# Patient Record
Sex: Male | Born: 1945 | Race: White | Hispanic: No | Marital: Single | Smoking: Former smoker
Health system: Southern US, Community
[De-identification: ages and names within clinical notes are randomized; demographics above are authoritative.]

## PROBLEM LIST (undated history)

## (undated) DIAGNOSIS — Z87442 Personal history of urinary calculi: Secondary | ICD-10-CM

## (undated) DIAGNOSIS — I219 Acute myocardial infarction, unspecified: Secondary | ICD-10-CM

## (undated) DIAGNOSIS — G629 Polyneuropathy, unspecified: Secondary | ICD-10-CM

## (undated) DIAGNOSIS — E785 Hyperlipidemia, unspecified: Secondary | ICD-10-CM

## (undated) DIAGNOSIS — R0600 Dyspnea, unspecified: Secondary | ICD-10-CM

## (undated) DIAGNOSIS — Z95820 Peripheral vascular angioplasty status with implants and grafts: Secondary | ICD-10-CM

## (undated) DIAGNOSIS — N35919 Unspecified urethral stricture, male, unspecified site: Secondary | ICD-10-CM

## (undated) DIAGNOSIS — M1990 Unspecified osteoarthritis, unspecified site: Secondary | ICD-10-CM

## (undated) DIAGNOSIS — Z794 Long term (current) use of insulin: Secondary | ICD-10-CM

## (undated) DIAGNOSIS — I255 Ischemic cardiomyopathy: Secondary | ICD-10-CM

## (undated) DIAGNOSIS — E119 Type 2 diabetes mellitus without complications: Secondary | ICD-10-CM

## (undated) DIAGNOSIS — C801 Malignant (primary) neoplasm, unspecified: Secondary | ICD-10-CM

## (undated) DIAGNOSIS — N200 Calculus of kidney: Secondary | ICD-10-CM

## (undated) DIAGNOSIS — M19042 Primary osteoarthritis, left hand: Secondary | ICD-10-CM

## (undated) DIAGNOSIS — N182 Chronic kidney disease, stage 2 (mild): Secondary | ICD-10-CM

## (undated) DIAGNOSIS — I2510 Atherosclerotic heart disease of native coronary artery without angina pectoris: Secondary | ICD-10-CM

## (undated) DIAGNOSIS — G479 Sleep disorder, unspecified: Secondary | ICD-10-CM

## (undated) DIAGNOSIS — I209 Angina pectoris, unspecified: Secondary | ICD-10-CM

## (undated) DIAGNOSIS — R200 Anesthesia of skin: Secondary | ICD-10-CM

## (undated) DIAGNOSIS — F1190 Opioid use, unspecified, uncomplicated: Secondary | ICD-10-CM

## (undated) DIAGNOSIS — E1142 Type 2 diabetes mellitus with diabetic polyneuropathy: Secondary | ICD-10-CM

## (undated) DIAGNOSIS — J449 Chronic obstructive pulmonary disease, unspecified: Secondary | ICD-10-CM

## (undated) DIAGNOSIS — I10 Essential (primary) hypertension: Secondary | ICD-10-CM

## (undated) DIAGNOSIS — IMO0001 Reserved for inherently not codable concepts without codable children: Secondary | ICD-10-CM

## (undated) DIAGNOSIS — H409 Unspecified glaucoma: Secondary | ICD-10-CM

## (undated) DIAGNOSIS — I2109 ST elevation (STEMI) myocardial infarction involving other coronary artery of anterior wall: Secondary | ICD-10-CM

## (undated) DIAGNOSIS — Z950 Presence of cardiac pacemaker: Secondary | ICD-10-CM

## (undated) DIAGNOSIS — C4490 Unspecified malignant neoplasm of skin, unspecified: Secondary | ICD-10-CM

## (undated) DIAGNOSIS — I491 Atrial premature depolarization: Secondary | ICD-10-CM

## (undated) DIAGNOSIS — C4491 Basal cell carcinoma of skin, unspecified: Secondary | ICD-10-CM

## (undated) HISTORY — PX: TONSILLECTOMY: SUR1361

## (undated) HISTORY — PX: CORONARY ANGIOPLASTY: SHX604

## (undated) HISTORY — PX: OTHER SURGICAL HISTORY: SHX169

## (undated) HISTORY — PX: LITHOTRIPSY: SUR834

## (undated) HISTORY — PX: NASAL SINUS SURGERY: SHX719

## (undated) HISTORY — PX: CARDIAC CATHETERIZATION: SHX172

---
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
## 2003-09-27 ENCOUNTER — Other Ambulatory Visit: Payer: Self-pay

## 2004-05-02 ENCOUNTER — Ambulatory Visit: Payer: Self-pay | Admitting: Urology

## 2006-04-04 ENCOUNTER — Ambulatory Visit: Payer: Self-pay | Admitting: Unknown Physician Specialty

## 2006-04-22 ENCOUNTER — Other Ambulatory Visit: Payer: Self-pay

## 2006-05-05 ENCOUNTER — Ambulatory Visit: Payer: Self-pay | Admitting: Unknown Physician Specialty

## 2008-08-04 ENCOUNTER — Ambulatory Visit: Payer: Self-pay | Admitting: Urology

## 2008-10-20 ENCOUNTER — Ambulatory Visit: Payer: Self-pay | Admitting: Urology

## 2008-10-24 ENCOUNTER — Ambulatory Visit: Payer: Self-pay | Admitting: Urology

## 2009-07-17 ENCOUNTER — Ambulatory Visit: Payer: Self-pay | Admitting: Urology

## 2011-10-31 ENCOUNTER — Ambulatory Visit: Payer: Self-pay | Admitting: Anesthesiology

## 2011-10-31 DIAGNOSIS — I10 Essential (primary) hypertension: Secondary | ICD-10-CM

## 2011-11-04 ENCOUNTER — Ambulatory Visit: Payer: Self-pay | Admitting: Urology

## 2011-12-26 ENCOUNTER — Ambulatory Visit: Payer: Self-pay | Admitting: Urology

## 2012-07-23 ENCOUNTER — Ambulatory Visit: Payer: Self-pay | Admitting: Orthopedic Surgery

## 2012-11-18 ENCOUNTER — Ambulatory Visit: Payer: Self-pay | Admitting: Urology

## 2012-11-19 ENCOUNTER — Ambulatory Visit: Payer: Self-pay | Admitting: Urology

## 2013-04-08 HISTORY — PX: PAROTIDECTOMY: SUR1003

## 2013-06-01 ENCOUNTER — Ambulatory Visit: Payer: Self-pay | Admitting: Unknown Physician Specialty

## 2013-07-12 DIAGNOSIS — I10 Essential (primary) hypertension: Secondary | ICD-10-CM | POA: Insufficient documentation

## 2013-08-16 ENCOUNTER — Ambulatory Visit: Payer: Self-pay | Admitting: Unknown Physician Specialty

## 2013-09-07 ENCOUNTER — Ambulatory Visit: Payer: Self-pay | Admitting: Unknown Physician Specialty

## 2013-09-08 LAB — PATHOLOGY REPORT

## 2014-06-20 DIAGNOSIS — L570 Actinic keratosis: Secondary | ICD-10-CM | POA: Diagnosis not present

## 2014-06-20 DIAGNOSIS — L821 Other seborrheic keratosis: Secondary | ICD-10-CM | POA: Diagnosis not present

## 2014-06-20 DIAGNOSIS — C44319 Basal cell carcinoma of skin of other parts of face: Secondary | ICD-10-CM | POA: Diagnosis not present

## 2014-06-20 DIAGNOSIS — C4491 Basal cell carcinoma of skin, unspecified: Secondary | ICD-10-CM | POA: Diagnosis not present

## 2014-06-20 DIAGNOSIS — D485 Neoplasm of uncertain behavior of skin: Secondary | ICD-10-CM | POA: Diagnosis not present

## 2014-06-20 DIAGNOSIS — X32XXXA Exposure to sunlight, initial encounter: Secondary | ICD-10-CM | POA: Diagnosis not present

## 2014-07-26 NOTE — H&P (Signed)
PATIENT NAME:  Henry [name], Henry [name] MR#:  [id number] DATE OF BIRTH:  1945/12/19  DATE OF ADMISSION:  11/04/2011  CHIEF COMPLAINT: Difficulty voiding.   HISTORY OF PRESENT ILLNESS: Henry [name] is a 69 year old white male who presented to the office with a two week history of difficulty voiding. He states that he has a kidney stone stuck within his urethra. On exam he did have a stone palpable in the proximal bulbar urethra. He has a history of urethral stricture disease and has had a situation very similar to this with a stone stuck behind the stricture. He underwent removal of the stone and incision of the stricture in 2011. He comes in now for cystoscopy with stone retrieval and incision of the urethral stricture.   ALLERGIES: No drug allergies.   CURRENT MEDICATIONS:  1. Janumet. 2. Simvastatin. 3. Valsartan. 4. Aspirin. 5. Avapro.   PAST SURGICAL HISTORY:  1. Lithotripsy in 1995, 1997, 1999, and 2000.  2. Tonsillectomy in the 1950's. 3. Nasal and sinus reconstructive surgery in 1964. 4. Removal of left parotid gland in 2008. 5. Cysto litholapaxy in 2010, 2011, and 2013.   PAST AND CURRENT MEDICAL CONDITION.  1. Hyperlipidemia.  2. Diabetes. 3. Arthritis.  4. Allergic rhinitis.   SOCIAL HISTORY: He denied tobacco use but quit smoking several years ago with a 25-pack-year history. He denied alcohol use.   FAMILY HISTORY: Noncontributory.   REVIEW OF SYSTEMS: The patient denied chest pain, shortness of breath, stroke, or coronary artery disease.   PHYSICAL EXAMINATION:   GENERAL: Well nourished white male in no distress.   HEENT: Sclerae were clear. Pupils were equally round, reactive to light and accommodation. Extraocular movements were intact.   NECK: Supple. No palpable cervical adenopathy.   LUNGS: Clear to auscultation.   CARDIOVASCULAR: Regular rhythm and rate without audible murmurs.   ABDOMEN: Soft, nontender abdomen.   GENITOURINARY: Circumcised. Stone was  palpable in proximal urethra. Testes smooth and nontender, 20 mL size each.   RECTAL: 35 gram smooth nontender prostate.   NEUROMUSCULAR: Nonfocal. The patient is alert, oriented x3.   IMPRESSION:  1. Urethral stone.  2. Urethral stricture disease.        PLAN: Cystolitholapaxy and incision of stricture of the urethra.   ____________________________ Otelia Limes. Henry Dill, MD [email or web address] D: 11/01/2011 11:51:17 ET T: 11/01/2011 12:23:31 ET JOB#: [id number]  cc: Otelia Limes. Henry Dill, MD, <Dictator> Royston Cowper MD ELECTRONICALLY SIGNED 11/04/2011 7:28

## 2014-07-26 NOTE — Op Note (Signed)
PATIENT NAME:  Henry [name], Henry [name] MR#:  [id number] DATE OF BIRTH:  01/12/1946  DATE OF PROCEDURE:  11/04/2011  PREOPERATIVE DIAGNOSES: 1.  Urethral stone.  2. Urethral stricture.   POSTOPERATIVE DIAGNOSES:  1. Urethral stone.  2. Urethral stricture.   PROCEDURE: 1. Visual internal urethrotomy using the holmium laser.  2. Laser lithotripsy of urethral stone using holmium laser.   SURGEON: Maryan Puls, M.D.   ANESTHETIST:  Dr. Andree Elk   ANESTHESIA: General.   INDICATIONS: See the dictated History and Physical. After informed consent, the patient requests the above procedures.   OPERATIVE SUMMARY: After adequate general anesthesia had been obtained, the patient was placed into the dorsal lithotomy position and the perineum was prepped and draped in the usual fashion. The 17 French cystoscope was coupled with the camera and then placed into the distal urethra. The scope was advanced up to the stricture in the proximal pendulous urethra and also a stone was located just proximal to the stricture. The 910-micron holmium laser fiber was advanced through the scope and the stone was fully disintegrated. The stricture was incised at the 12 o'clock position and eventually allowed passage of the scope into the bladder. The bladder was thoroughly inspected. No bladder lesions were identified. Both ureteral orifices were identified and had clear efflux. At this point, a 0.035 guidewire was advanced through the scope and curled up into the bladder. Cystoscope was removed. A 20 French Councill urethral catheter was advanced over the guidewire and placed into the bladder. Guidewire was then removed, taking care to leave the Foley in position. The procedure was then terminated. A B and O suppository was placed. The patient was then transferred to the recovery room in stable condition.   ____________________________ Otelia Limes. Yves Dill, MD [email or web address] D: 11/04/2011 12:24:30 ET T: 11/04/2011 14:14:43  ET JOB#: [id number]  cc: Otelia Limes. Yves Dill, MD, <Dictator> Royston Cowper MD ELECTRONICALLY SIGNED 11/04/2011 16:07

## 2014-07-30 NOTE — Op Note (Signed)
PATIENT NAME:  Henry [name], Henry [name] MR#:  [id number] DATE OF BIRTH:  July 28, 1945  DATE OF PROCEDURE:  09/07/2013  PREOPERATIVE DIAGNOSIS: Right parotid mass.   POSTOPERATIVE DIAGNOSIS: Right parotid mass.  ATTENDING SURGEON: Beverly Gust, MD  ASSISTANT SURGEON: Dr. Kathyrn Sheriff.   OPERATION PERFORMED:  1.  Facial nerve monitoring for an hour and a half.  2.  Right superficial parotidectomy.   OPERATIVE FINDINGS: Approximately 3 x 3 cm firm mass in the inferior aspect of the superficial lobe of the parotid on the right.   DESCRIPTION OF PROCEDURE: Henry [name] was identified in the holding area, taken to the operating room and placed in the supine position. After general endotracheal anesthesia, the table was turned 90 degrees. The facial nerve monitor was applied and left on throughout the procedure. An incision line was marked in the preauricular/pretragal area down in the infra-auricular space down to the upper neck in a natural skin crease. A local anesthetic of 1% lidocaine with 1:100,000 units of epinephrine was used to inject along the incision line. A total of 9 mL was used. The right face and neck were then prepped and draped in sterile fashion. A 15 blade was used to incise down to the superficial muscular aponeurotic system superiorly and the platysma inferiorly. A supra-SMAS flap was elevated onto the face anteriorly and posterior down on the neck. Hemostasis was achieved using the Bovie cautery. The sternocleidomastoid muscle was identified and the parotid gland was peeled off of the sternocleidomastoid muscle anteriorly. This dissection proceeded up in the pretragal area as the parotid was peeled off of the pretracheal cartilage. The microbipolar was used for hemostasis in this part of the dissection.  As the dissection proceeded medially, the posterior belly of the digastric muscle was identified and the facial nerve was identified as it emanated from the temporal bone. This was stimulated and  all branches of the nerve remained intact. The firm mass in the parotid, which was in the anterior aspect adjacent to the sternocleidomastoid muscle, was gently dissected free maintaining the capsule in position. The facial nerve was then dissected laterally. The temporal branch was easily identified and left preserved throughout the case. The zygomatic branch was also identified. Inferior to this as the buccal and marginal branches came off the inferior branch of the nerve, the parotid gland was peeled off. The Harmonic scalpel was used for this dissection. The marginal branch and the cervical branch were then traced inferiorly as the gland was peeled from the nerve. The retromandibular vein was identified and a branch was emanating from the tumor itself. This was divided between hemostats and suture ligated using 3-0 silk suture ligatures. The tumor was then peeled off of the inferior branches of the facial nerve and the retromandibular vein was left intact. As the superficial lobe of the gland in the tumor was peeled inferiorly, the fascial attachments were divided removing the tumor in its entirety.  The wound was then copiously irrigated with saline. Any small bleeding points were cauterized using the microbipolar. At the end of the case, the main trunk of the facial nerve was stimulated and all branches remained intact. With this completed, a #10 TLS drain was brought out of the wound inferiorly. The SMAS layer was closed using interrupted 4-0 Vicryl. The subcutaneous layers were also closed using 4-0 Vicryl. The skin was closed using Dermabond. A Tegaderm was placed over the drain and the patient was then returned to anesthesia where he was awakened in the operating room and  taken to the recovery room in stable condition.   CULTURES: None.   SPECIMENS: Right lobe parotid.   ESTIMATED BLOOD LOSS: Less than 20 mL.   ____________________________ Roena Malady, MD ctm:aw D: 09/07/2013 09:35:16  ET T: 09/07/2013 09:41:21 ET JOB#: [id number]  cc: Roena Malady, MD, <Dictator> Roena Malady MD ELECTRONICALLY SIGNED 10/14/2013 13:41

## 2014-08-09 DIAGNOSIS — C44319 Basal cell carcinoma of skin of other parts of face: Secondary | ICD-10-CM | POA: Diagnosis not present

## 2014-08-09 DIAGNOSIS — L905 Scar conditions and fibrosis of skin: Secondary | ICD-10-CM | POA: Diagnosis not present

## 2014-08-22 DIAGNOSIS — H4020X1 Unspecified primary angle-closure glaucoma, mild stage: Secondary | ICD-10-CM | POA: Diagnosis not present

## 2014-09-13 DIAGNOSIS — C4441 Basal cell carcinoma of skin of scalp and neck: Secondary | ICD-10-CM | POA: Diagnosis not present

## 2014-09-16 DIAGNOSIS — C44619 Basal cell carcinoma of skin of left upper limb, including shoulder: Secondary | ICD-10-CM | POA: Diagnosis not present

## 2014-09-16 DIAGNOSIS — D485 Neoplasm of uncertain behavior of skin: Secondary | ICD-10-CM | POA: Diagnosis not present

## 2014-09-16 DIAGNOSIS — Z85828 Personal history of other malignant neoplasm of skin: Secondary | ICD-10-CM | POA: Diagnosis not present

## 2014-10-14 DIAGNOSIS — C4491 Basal cell carcinoma of skin, unspecified: Secondary | ICD-10-CM | POA: Diagnosis not present

## 2014-10-14 DIAGNOSIS — Z0000 Encounter for general adult medical examination without abnormal findings: Secondary | ICD-10-CM | POA: Diagnosis not present

## 2014-10-14 DIAGNOSIS — E785 Hyperlipidemia, unspecified: Secondary | ICD-10-CM | POA: Diagnosis not present

## 2014-10-14 DIAGNOSIS — E1129 Type 2 diabetes mellitus with other diabetic kidney complication: Secondary | ICD-10-CM | POA: Diagnosis not present

## 2014-10-14 DIAGNOSIS — I10 Essential (primary) hypertension: Secondary | ICD-10-CM | POA: Diagnosis not present

## 2014-11-04 DIAGNOSIS — L905 Scar conditions and fibrosis of skin: Secondary | ICD-10-CM | POA: Diagnosis not present

## 2014-11-04 DIAGNOSIS — C44619 Basal cell carcinoma of skin of left upper limb, including shoulder: Secondary | ICD-10-CM | POA: Diagnosis not present

## 2014-11-11 DIAGNOSIS — M170 Bilateral primary osteoarthritis of knee: Secondary | ICD-10-CM | POA: Diagnosis not present

## 2014-12-07 ENCOUNTER — Encounter: Payer: Self-pay | Admitting: *Deleted

## 2014-12-08 ENCOUNTER — Encounter
Admission: RE | Disposition: A | Discharge status: Home / Self Care | Payer: Self-pay | Source: Ambulatory Visit | Attending: Unknown Physician Specialty

## 2014-12-08 ENCOUNTER — Ambulatory Visit: Payer: Commercial Managed Care - HMO | Admitting: Certified Registered Nurse Anesthetist

## 2014-12-08 ENCOUNTER — Encounter: Payer: Self-pay | Admitting: *Deleted

## 2014-12-08 ENCOUNTER — Ambulatory Visit
Admission: RE | Admit: 2014-12-08 | Discharge: 2014-12-08 | Disposition: A | Discharge status: Home / Self Care | Payer: Commercial Managed Care - HMO | Source: Ambulatory Visit | Attending: Unknown Physician Specialty | Admitting: Unknown Physician Specialty

## 2014-12-08 DIAGNOSIS — K640 First degree hemorrhoids: Secondary | ICD-10-CM | POA: Diagnosis not present

## 2014-12-08 DIAGNOSIS — D120 Benign neoplasm of cecum: Secondary | ICD-10-CM | POA: Diagnosis not present

## 2014-12-08 DIAGNOSIS — Z794 Long term (current) use of insulin: Secondary | ICD-10-CM | POA: Insufficient documentation

## 2014-12-08 DIAGNOSIS — D122 Benign neoplasm of ascending colon: Secondary | ICD-10-CM | POA: Diagnosis not present

## 2014-12-08 DIAGNOSIS — Z859 Personal history of malignant neoplasm, unspecified: Secondary | ICD-10-CM | POA: Diagnosis not present

## 2014-12-08 DIAGNOSIS — E1122 Type 2 diabetes mellitus with diabetic chronic kidney disease: Secondary | ICD-10-CM | POA: Insufficient documentation

## 2014-12-08 DIAGNOSIS — E785 Hyperlipidemia, unspecified: Secondary | ICD-10-CM | POA: Insufficient documentation

## 2014-12-08 DIAGNOSIS — Z7982 Long term (current) use of aspirin: Secondary | ICD-10-CM | POA: Insufficient documentation

## 2014-12-08 DIAGNOSIS — N189 Chronic kidney disease, unspecified: Secondary | ICD-10-CM | POA: Diagnosis not present

## 2014-12-08 DIAGNOSIS — D123 Benign neoplasm of transverse colon: Secondary | ICD-10-CM | POA: Insufficient documentation

## 2014-12-08 DIAGNOSIS — I129 Hypertensive chronic kidney disease with stage 1 through stage 4 chronic kidney disease, or unspecified chronic kidney disease: Secondary | ICD-10-CM | POA: Diagnosis not present

## 2014-12-08 DIAGNOSIS — D128 Benign neoplasm of rectum: Secondary | ICD-10-CM | POA: Diagnosis not present

## 2014-12-08 DIAGNOSIS — K5790 Diverticulosis of intestine, part unspecified, without perforation or abscess without bleeding: Secondary | ICD-10-CM | POA: Diagnosis not present

## 2014-12-08 DIAGNOSIS — K5730 Diverticulosis of large intestine without perforation or abscess without bleeding: Secondary | ICD-10-CM | POA: Diagnosis not present

## 2014-12-08 DIAGNOSIS — Z87891 Personal history of nicotine dependence: Secondary | ICD-10-CM | POA: Insufficient documentation

## 2014-12-08 DIAGNOSIS — D127 Benign neoplasm of rectosigmoid junction: Secondary | ICD-10-CM | POA: Insufficient documentation

## 2014-12-08 DIAGNOSIS — D124 Benign neoplasm of descending colon: Secondary | ICD-10-CM | POA: Insufficient documentation

## 2014-12-08 DIAGNOSIS — Z1211 Encounter for screening for malignant neoplasm of colon: Secondary | ICD-10-CM | POA: Insufficient documentation

## 2014-12-08 DIAGNOSIS — H409 Unspecified glaucoma: Secondary | ICD-10-CM | POA: Insufficient documentation

## 2014-12-08 DIAGNOSIS — K635 Polyp of colon: Secondary | ICD-10-CM | POA: Diagnosis not present

## 2014-12-08 HISTORY — DX: Malignant (primary) neoplasm, unspecified: C80.1

## 2014-12-08 HISTORY — DX: Hyperlipidemia, unspecified: E78.5

## 2014-12-08 HISTORY — DX: Essential (primary) hypertension: I10

## 2014-12-08 HISTORY — DX: Unspecified osteoarthritis, unspecified site: M19.90

## 2014-12-08 HISTORY — DX: Unspecified glaucoma: H40.9

## 2014-12-08 HISTORY — PX: COLONOSCOPY WITH PROPOFOL: SHX5780

## 2014-12-08 LAB — GLUCOSE, CAPILLARY: GLUCOSE-CAPILLARY: 146 mg/dL — AB (ref 65–99)

## 2014-12-08 SURGERY — COLONOSCOPY WITH PROPOFOL
Anesthesia: General

## 2014-12-08 MED ORDER — SODIUM CHLORIDE 0.9 % IV SOLN
INTRAVENOUS | Status: DC
Start: 2014-12-08 — End: 2014-12-08
  Administered 2014-12-08: 08:00:00 via INTRAVENOUS

## 2014-12-08 MED ORDER — PROPOFOL INFUSION 10 MG/ML OPTIME
INTRAVENOUS | Status: DC | PRN
  Administered 2014-12-08: 120 ug/kg/min via INTRAVENOUS

## 2014-12-08 MED ORDER — PROPOFOL 10 MG/ML IV BOLUS
INTRAVENOUS | Status: DC | PRN
  Administered 2014-12-08: 50 mg via INTRAVENOUS

## 2014-12-08 MED ORDER — EPHEDRINE SULFATE 50 MG/ML IJ SOLN
INTRAMUSCULAR | Status: DC | PRN
  Administered 2014-12-08 (×2): 5 mg via INTRAVENOUS

## 2014-12-08 MED ORDER — PHENYLEPHRINE HCL 10 MG/ML IJ SOLN
INTRAMUSCULAR | Status: DC | PRN
  Administered 2014-12-08 (×2): 100 ug via INTRAVENOUS

## 2014-12-08 MED ORDER — MIDAZOLAM HCL 5 MG/5ML IJ SOLN
INTRAMUSCULAR | Status: DC | PRN
Start: 2014-12-08 — End: 2014-12-08
  Administered 2014-12-08: 1 mg via INTRAVENOUS

## 2014-12-08 MED ORDER — FENTANYL CITRATE (PF) 100 MCG/2ML IJ SOLN
INTRAMUSCULAR | Status: DC | PRN
  Administered 2014-12-08: 50 ug via INTRAVENOUS

## 2014-12-08 MED ORDER — VASOPRESSIN 20 UNIT/ML IV SOLN
INTRAVENOUS | Status: DC | PRN
  Administered 2014-12-08 (×2): 1 [IU] via INTRAVENOUS

## 2014-12-08 MED ORDER — METHYLENE BLUE 1 % INJ SOLN
INTRAMUSCULAR | Status: AC
  Filled 2014-12-08: qty 1

## 2014-12-08 MED ORDER — SODIUM CHLORIDE 0.9 % IV SOLN: INTRAVENOUS | Status: DC

## 2014-12-08 NOTE — Op Note (Signed)
[facility] Patient Name: Henry [name] Procedure Date: 12/08/2014 8:01 AM MRN: [id number] Account #: [id number] Date of Birth: [birth year] Admit Type: Outpatient Age: 69 Room: [facility] ENDO ROOM 1 Gender: Male Note Status: Finalized Procedure:         Colonoscopy Indications:       Screening for colorectal malignant neoplasm Providers:         Manya Silvas, MD Referring MD:      Youlanda Roys. Lovie Macadamia, MD (Referring MD) Medicines:         Propofol per Anesthesia Complications:     No immediate complications. Procedure:         Pre-Anesthesia Assessment:                    - After reviewing the risks and benefits, the patient was                     deemed in satisfactory condition to undergo the procedure.                    After obtaining informed consent, the colonoscope was                     passed under direct vision. Throughout the procedure, the                     patient's blood pressure, pulse, and oxygen saturations                     were monitored continuously. The Colonoscope was                     introduced through the anus and advanced to the the cecum,                     identified by appendiceal orifice and ileocecal valve. The                     colonoscopy was performed without difficulty. The patient                     tolerated the procedure well. The quality of the bowel                     preparation was good. Findings:      Internal hemorrhoids were found during endoscopy. The hemorrhoids were       medium-sized and Grade I (internal hemorrhoids that do not prolapse).      Multiple small and large-mouthed diverticula were found in the       transverse colon and in the ascending colon.      A medium polyp was found in the transverse colon. The polyp was sessile.       The polyp was removed with a hot snare. Resection and retrieval were       complete.      A diminutive polyp was found in the ascending colon. The  polyp was       sessile. The polyp was removed with a cold snare. Resection and       retrieval were complete.      A small polyp was found in the cecum. The polyp was sessile. The polyp       was removed with a hot snare. Resection and retrieval were complete.  A small polyp was found in the cecum. The polyp was sessile. The polyp       was removed with a hot snare. Resection and retrieval were complete.      A small polyp was found in the transverse colon. The polyp was sessile.       The polyp was removed with a hot snare. Resection and retrieval were       complete.      A small polyp was found in the transverse colon. The polyp was sessile.       The polyp was removed with a hot snare. Resection and retrieval were       complete. The polyp was removed with a cold snare. Resection and       retrieval were complete.      A diminutive polyp was found in the transverse colon. The polyp was       sessile. The polyp was removed with a cold snare. Resection and       retrieval were complete.      A medium polyp was found in the recto-sigmoid colon. The polyp was       sessile. The polyp was removed with a hot snare. Resection and retrieval       were complete.      A small polyp was found in the rectum. The polyp was sessile. The polyp       was removed with a hot snare. Resection and retrieval were complete.      A small polyp was found in the rectum. The polyp was sessile. The polyp       was removed with a hot snare. Resection and retrieval were complete.      Clips were placed on several sites to prevent delayed bleeding. Nine       were used. Impression:        - Internal hemorrhoids.                    - Diverticulosis in the transverse colon and in the                     ascending colon.                    - One medium polyp in the transverse colon. Resected and                     retrieved.                    - One diminutive polyp in the ascending colon. Resected                      and retrieved.                    - One small polyp in the cecum. Resected and retrieved.                    - One small polyp in the cecum. Resected and retrieved.                    - One small polyp in the transverse colon. Resected and                     retrieved.                    -  One small polyp in the transverse colon. Resected and                     retrieved.                    - One diminutive polyp in the transverse colon. Resected                     and retrieved.                    - One medium polyp at the recto-sigmoid colon. Resected                     and retrieved.                    - One small polyp in the rectum. Resected and retrieved.                    - One small polyp in the rectum. Resected and retrieved. Recommendation:    - Await pathology results. Manya Silvas, MD 12/08/2014 9:38:45 AM This report has been signed electronically. Number of Addenda: 0 Note Initiated On: 12/08/2014 8:01 AM Scope Withdrawal Time: 0 hours 37 minutes 22 seconds  Total Procedure Duration: 1 hour 14 minutes 10 seconds       [facility]

## 2014-12-08 NOTE — H&P (Signed)
   Primary Care Physician:  Juluis Pitch, MD Primary Gastroenterologist:  Dr. Vira Agar  Pre-Procedure History & Physical: HPI:  Henry [name] is a 69 y.o. male is here for an colonoscopy.   Past Medical History  Diagnosis Date  . Diabetes mellitus without complication   . Hypertension   . Cancer   . Hyperlipidemia   . Glaucoma   . Arthritis   . Chronic kidney disease     Past Surgical History  Procedure Laterality Date  . Lithotripsy      Prior to Admission medications   Medication Sig Start Date End Date Taking? Authorizing Provider  aspirin EC 81 MG tablet Take 81 mg by mouth daily.   Yes Historical Provider, MD  fluticasone (FLONASE) 50 MCG/ACT nasal spray Place 1 spray into both nostrils daily.   Yes Historical Provider, MD  insulin detemir (LEVEMIR) 100 UNIT/ML injection Inject 100 Units into the skin at bedtime.   Yes Historical Provider, MD  latanoprost (XALATAN) 0.005 % ophthalmic solution Place 1 drop into both eyes at bedtime.   Yes Historical Provider, MD  meloxicam (MOBIC) 15 MG tablet Take 15 mg by mouth daily.   Yes Historical Provider, MD  simvastatin (ZOCOR) 40 MG tablet Take 40 mg by mouth daily.   Yes Historical Provider, MD  sitaGLIPtin-metformin (JANUMET) 50-1000 MG per tablet Take 1 tablet by mouth 2 (two) times daily with a meal.   Yes Historical Provider, MD  valsartan (DIOVAN) 320 MG tablet Take 320 mg by mouth daily.   Yes Historical Provider, MD    Allergies as of 11/16/2014  . (Not on File)    History reviewed. No pertinent family history.  Social History   Social History  . Marital Status: Single    Spouse Name: N/A  . Number of Children: N/A  . Years of Education: N/A   Occupational History  . Not on file.   Social History Main Topics  . Smoking status: Former Research scientist (life sciences)  . Smokeless tobacco: Never Used  . Alcohol Use: No  . Drug Use: No  . Sexual Activity: Not on file   Other Topics Concern  . Not on file   Social History  Narrative    Review of Systems: See HPI, otherwise negative ROS  Physical Exam: BP 125/69 mmHg  Pulse 91  Temp(Src) 97.7 F (36.5 C) (Tympanic)  Resp 14  Ht 5\' 11"  (1.803 m)  Wt 99.791 kg (220 lb)  BMI 30.70 kg/m2  SpO2 96% General:   Alert,  pleasant and cooperative in NAD Head:  Normocephalic and atraumatic. Neck:  Supple; no masses or thyromegaly. Lungs:  Clear throughout to auscultation.    Heart:  Regular rate and rhythm. Abdomen:  Soft, nontender and nondistended. Normal bowel sounds, without guarding, and without rebound.   Neurologic:  Alert and  oriented x4;  grossly normal neurologically.  Impression/Plan: Henry [name] is here for an colonoscopy to be performed for screening  Risks, benefits, limitations, and alternatives regarding  colonoscopy have been reviewed with the patient.  Questions have been answered.  All parties agreeable.   Gaylyn Cheers, MD  12/08/2014, 8:07 AM

## 2014-12-08 NOTE — Anesthesia Preprocedure Evaluation (Signed)
Anesthesia Evaluation  Patient identified by MRN, date of birth, ID band Patient awake    Reviewed: Allergy & Precautions, H&P , NPO status , Patient's Chart, lab work & pertinent test results, reviewed documented beta blocker date and time   History of Anesthesia Complications Negative for: history of anesthetic complications  Airway Mallampati: III  TM Distance: >3 FB Neck ROM: full    Dental no notable dental hx. (+) Poor Dentition, Missing, Chipped   Pulmonary neg shortness of breath, neg sleep apnea, COPD (not diagnosed, but likely given history)neg recent URI, former smoker,  breath sounds clear to auscultation  + decreased breath sounds- wheezing      Cardiovascular Exercise Tolerance: Good hypertension, - angina- CAD, - Past MI, - Cardiac Stents and - CABG Normal cardiovascular exam- dysrhythmias - Valvular Problems/MurmursRhythm:regular Rate:Normal     Neuro/Psych negative neurological ROS  negative psych ROS   GI/Hepatic negative GI ROS, Neg liver ROS,   Endo/Other  diabetes, Insulin Dependent  Renal/GU CRFRenal disease  negative genitourinary   Musculoskeletal   Abdominal   Peds  Hematology negative hematology ROS (+)   Anesthesia Other Findings Past Medical History:   Diabetes mellitus without complication                       Hypertension                                                 Cancer                                                       Hyperlipidemia                                               Glaucoma                                                     Arthritis                                                    Chronic kidney disease                                       Reproductive/Obstetrics negative OB ROS                             Anesthesia Physical Anesthesia Plan  ASA: III  Anesthesia Plan: General   Post-op Pain Management:    Induction:    Airway Management Planned:   Additional Equipment:   Intra-op Plan:   Post-operative Plan:   Informed Consent: I have  reviewed the patients History and Physical, chart, labs and discussed the procedure including the risks, benefits and alternatives for the proposed anesthesia with the patient or authorized representative who has indicated his/her understanding and acceptance.   Dental Advisory Given  Plan Discussed with: Anesthesiologist, CRNA and Surgeon  Anesthesia Plan Comments:         Anesthesia Quick Evaluation

## 2014-12-08 NOTE — Transfer of Care (Signed)
Immediate Anesthesia Transfer of Care Note  Patient: Henry [name]  Procedure(s) Performed: Procedure(s): COLONOSCOPY WITH PROPOFOL (N/A)  Patient Location: PACU  Anesthesia Type:General  Level of Consciousness: sedated  Airway & Oxygen Therapy: Patient Spontanous Breathing and Patient connected to nasal cannula oxygen  Post-op Assessment: Report given to RN and Post -op Vital signs reviewed and stable  Post vital signs: Reviewed and stable  Last Vitals:  Filed Vitals:   12/08/14 0927  BP: 130/110  Pulse:   Temp: 36.9 C  Resp: 20    Complications: No apparent anesthesia complications

## 2014-12-08 NOTE — Anesthesia Postprocedure Evaluation (Signed)
  Anesthesia Post-op Note  Patient: Henry [name]  Procedure(s) Performed: Procedure(s): COLONOSCOPY WITH PROPOFOL (N/A)  Anesthesia type:General  Patient location: PACU  Post pain: Pain level controlled  Post assessment: Post-op Vital signs reviewed, Patient's Cardiovascular Status Stable, Respiratory Function Stable, Patent Airway and No signs of Nausea or vomiting  Post vital signs: Reviewed and stable  Last Vitals:  Filed Vitals:   12/08/14 0955  BP:   Pulse: 80  Temp:   Resp: 19    Level of consciousness: awake, alert  and patient cooperative  Complications: No apparent anesthesia complications

## 2014-12-09 LAB — SURGICAL PATHOLOGY

## 2015-01-20 DIAGNOSIS — R3914 Feeling of incomplete bladder emptying: Secondary | ICD-10-CM | POA: Diagnosis not present

## 2015-01-20 DIAGNOSIS — N401 Enlarged prostate with lower urinary tract symptoms: Secondary | ICD-10-CM | POA: Diagnosis not present

## 2015-01-20 DIAGNOSIS — N138 Other obstructive and reflux uropathy: Secondary | ICD-10-CM | POA: Diagnosis not present

## 2015-01-20 DIAGNOSIS — N200 Calculus of kidney: Secondary | ICD-10-CM | POA: Diagnosis not present

## 2015-02-17 DIAGNOSIS — L570 Actinic keratosis: Secondary | ICD-10-CM | POA: Diagnosis not present

## 2015-02-17 DIAGNOSIS — D235 Other benign neoplasm of skin of trunk: Secondary | ICD-10-CM | POA: Diagnosis not present

## 2015-02-17 DIAGNOSIS — L821 Other seborrheic keratosis: Secondary | ICD-10-CM | POA: Diagnosis not present

## 2015-02-17 DIAGNOSIS — Z85828 Personal history of other malignant neoplasm of skin: Secondary | ICD-10-CM | POA: Diagnosis not present

## 2015-02-17 DIAGNOSIS — D225 Melanocytic nevi of trunk: Secondary | ICD-10-CM | POA: Diagnosis not present

## 2015-02-17 DIAGNOSIS — D485 Neoplasm of uncertain behavior of skin: Secondary | ICD-10-CM | POA: Diagnosis not present

## 2015-02-20 ENCOUNTER — Encounter (INDEPENDENT_AMBULATORY_CARE_PROVIDER_SITE_OTHER): Payer: Self-pay

## 2015-02-20 DIAGNOSIS — H4020X1 Unspecified primary angle-closure glaucoma, mild stage: Secondary | ICD-10-CM | POA: Diagnosis not present

## 2015-02-28 DIAGNOSIS — H40003 Preglaucoma, unspecified, bilateral: Secondary | ICD-10-CM | POA: Diagnosis not present

## 2015-04-07 DIAGNOSIS — E1129 Type 2 diabetes mellitus with other diabetic kidney complication: Secondary | ICD-10-CM | POA: Diagnosis not present

## 2015-04-11 DIAGNOSIS — M170 Bilateral primary osteoarthritis of knee: Secondary | ICD-10-CM | POA: Diagnosis not present

## 2015-04-11 DIAGNOSIS — M2392 Unspecified internal derangement of left knee: Secondary | ICD-10-CM | POA: Diagnosis not present

## 2015-04-13 ENCOUNTER — Other Ambulatory Visit: Payer: Self-pay | Admitting: Orthopedic Surgery

## 2015-04-13 DIAGNOSIS — M2392 Unspecified internal derangement of left knee: Secondary | ICD-10-CM

## 2015-04-26 DIAGNOSIS — I499 Cardiac arrhythmia, unspecified: Secondary | ICD-10-CM | POA: Diagnosis not present

## 2015-04-30 DIAGNOSIS — M79609 Pain in unspecified limb: Secondary | ICD-10-CM | POA: Diagnosis not present

## 2015-05-01 ENCOUNTER — Ambulatory Visit: Payer: Commercial Managed Care - HMO

## 2015-05-01 DIAGNOSIS — R05 Cough: Secondary | ICD-10-CM | POA: Diagnosis not present

## 2015-05-01 DIAGNOSIS — F17210 Nicotine dependence, cigarettes, uncomplicated: Secondary | ICD-10-CM | POA: Diagnosis not present

## 2015-05-02 DIAGNOSIS — M24571 Contracture, right ankle: Secondary | ICD-10-CM | POA: Diagnosis not present

## 2015-05-02 DIAGNOSIS — M7751 Other enthesopathy of right foot: Secondary | ICD-10-CM | POA: Diagnosis not present

## 2015-05-02 DIAGNOSIS — M216X2 Other acquired deformities of left foot: Secondary | ICD-10-CM | POA: Diagnosis not present

## 2015-05-02 DIAGNOSIS — M216X1 Other acquired deformities of right foot: Secondary | ICD-10-CM | POA: Diagnosis not present

## 2015-05-02 DIAGNOSIS — M7752 Other enthesopathy of left foot: Secondary | ICD-10-CM | POA: Diagnosis not present

## 2015-05-02 DIAGNOSIS — E1142 Type 2 diabetes mellitus with diabetic polyneuropathy: Secondary | ICD-10-CM | POA: Diagnosis not present

## 2015-05-02 DIAGNOSIS — M24572 Contracture, left ankle: Secondary | ICD-10-CM | POA: Diagnosis not present

## 2015-05-08 DIAGNOSIS — E1129 Type 2 diabetes mellitus with other diabetic kidney complication: Secondary | ICD-10-CM | POA: Diagnosis not present

## 2015-05-08 DIAGNOSIS — I10 Essential (primary) hypertension: Secondary | ICD-10-CM | POA: Diagnosis not present

## 2015-05-08 DIAGNOSIS — I491 Atrial premature depolarization: Secondary | ICD-10-CM | POA: Diagnosis not present

## 2015-05-08 DIAGNOSIS — Z8739 Personal history of other diseases of the musculoskeletal system and connective tissue: Secondary | ICD-10-CM | POA: Diagnosis not present

## 2015-05-08 DIAGNOSIS — R002 Palpitations: Secondary | ICD-10-CM | POA: Diagnosis not present

## 2015-05-08 DIAGNOSIS — I499 Cardiac arrhythmia, unspecified: Secondary | ICD-10-CM | POA: Diagnosis not present

## 2015-05-09 DIAGNOSIS — R7309 Other abnormal glucose: Secondary | ICD-10-CM | POA: Diagnosis not present

## 2015-05-09 DIAGNOSIS — D72829 Elevated white blood cell count, unspecified: Secondary | ICD-10-CM | POA: Diagnosis not present

## 2015-05-11 DIAGNOSIS — R002 Palpitations: Secondary | ICD-10-CM | POA: Diagnosis not present

## 2015-05-15 DIAGNOSIS — I491 Atrial premature depolarization: Secondary | ICD-10-CM | POA: Diagnosis not present

## 2015-05-15 DIAGNOSIS — E1129 Type 2 diabetes mellitus with other diabetic kidney complication: Secondary | ICD-10-CM | POA: Diagnosis not present

## 2015-05-23 DIAGNOSIS — M7752 Other enthesopathy of left foot: Secondary | ICD-10-CM | POA: Diagnosis not present

## 2015-05-23 DIAGNOSIS — M216X1 Other acquired deformities of right foot: Secondary | ICD-10-CM | POA: Diagnosis not present

## 2015-05-23 DIAGNOSIS — M216X2 Other acquired deformities of left foot: Secondary | ICD-10-CM | POA: Diagnosis not present

## 2015-05-23 DIAGNOSIS — M7751 Other enthesopathy of right foot: Secondary | ICD-10-CM | POA: Diagnosis not present

## 2015-05-24 ENCOUNTER — Ambulatory Visit
Admission: RE | Admit: 2015-05-24 | Discharge: 2015-05-24 | Disposition: A | Discharge status: Home / Self Care | Payer: Commercial Managed Care - HMO | Source: Ambulatory Visit | Attending: Orthopedic Surgery | Admitting: Orthopedic Surgery

## 2015-05-24 DIAGNOSIS — M2392 Unspecified internal derangement of left knee: Secondary | ICD-10-CM

## 2015-05-25 DIAGNOSIS — E1129 Type 2 diabetes mellitus with other diabetic kidney complication: Secondary | ICD-10-CM | POA: Diagnosis not present

## 2015-05-26 DIAGNOSIS — E785 Hyperlipidemia, unspecified: Secondary | ICD-10-CM | POA: Diagnosis not present

## 2015-05-26 DIAGNOSIS — I491 Atrial premature depolarization: Secondary | ICD-10-CM | POA: Diagnosis not present

## 2015-05-26 DIAGNOSIS — I10 Essential (primary) hypertension: Secondary | ICD-10-CM | POA: Diagnosis not present

## 2015-05-26 DIAGNOSIS — R002 Palpitations: Secondary | ICD-10-CM | POA: Diagnosis not present

## 2015-05-29 ENCOUNTER — Ambulatory Visit
Admission: RE | Admit: 2015-05-29 | Discharge: 2015-05-29 | Disposition: A | Discharge status: Home / Self Care | Payer: Commercial Managed Care - HMO | Source: Ambulatory Visit | Attending: Orthopedic Surgery | Admitting: Orthopedic Surgery

## 2015-05-29 DIAGNOSIS — M25462 Effusion, left knee: Secondary | ICD-10-CM | POA: Insufficient documentation

## 2015-05-29 DIAGNOSIS — M170 Bilateral primary osteoarthritis of knee: Secondary | ICD-10-CM | POA: Diagnosis present

## 2015-05-29 DIAGNOSIS — M25562 Pain in left knee: Secondary | ICD-10-CM | POA: Insufficient documentation

## 2015-05-29 DIAGNOSIS — M2392 Unspecified internal derangement of left knee: Secondary | ICD-10-CM | POA: Diagnosis present

## 2015-05-29 DIAGNOSIS — M2242 Chondromalacia patellae, left knee: Secondary | ICD-10-CM | POA: Diagnosis not present

## 2015-05-29 DIAGNOSIS — M7989 Other specified soft tissue disorders: Secondary | ICD-10-CM | POA: Diagnosis not present

## 2015-06-05 DIAGNOSIS — B9689 Other specified bacterial agents as the cause of diseases classified elsewhere: Secondary | ICD-10-CM | POA: Diagnosis not present

## 2015-06-05 DIAGNOSIS — J019 Acute sinusitis, unspecified: Secondary | ICD-10-CM | POA: Diagnosis not present

## 2015-06-05 DIAGNOSIS — R05 Cough: Secondary | ICD-10-CM | POA: Diagnosis not present

## 2015-06-22 DIAGNOSIS — G8929 Other chronic pain: Secondary | ICD-10-CM | POA: Diagnosis not present

## 2015-06-22 DIAGNOSIS — M25562 Pain in left knee: Secondary | ICD-10-CM | POA: Diagnosis not present

## 2015-06-27 DIAGNOSIS — M25562 Pain in left knee: Secondary | ICD-10-CM | POA: Diagnosis not present

## 2015-06-27 DIAGNOSIS — G8929 Other chronic pain: Secondary | ICD-10-CM | POA: Diagnosis not present

## 2015-07-07 DIAGNOSIS — M25562 Pain in left knee: Secondary | ICD-10-CM | POA: Diagnosis not present

## 2015-07-07 DIAGNOSIS — G8929 Other chronic pain: Secondary | ICD-10-CM | POA: Diagnosis not present

## 2015-07-11 DIAGNOSIS — G8929 Other chronic pain: Secondary | ICD-10-CM | POA: Diagnosis not present

## 2015-07-11 DIAGNOSIS — M25562 Pain in left knee: Secondary | ICD-10-CM | POA: Diagnosis not present

## 2015-07-14 DIAGNOSIS — G8929 Other chronic pain: Secondary | ICD-10-CM | POA: Diagnosis not present

## 2015-07-14 DIAGNOSIS — M25562 Pain in left knee: Secondary | ICD-10-CM | POA: Diagnosis not present

## 2015-07-18 DIAGNOSIS — M25562 Pain in left knee: Secondary | ICD-10-CM | POA: Diagnosis not present

## 2015-07-18 DIAGNOSIS — G8929 Other chronic pain: Secondary | ICD-10-CM | POA: Diagnosis not present

## 2015-07-20 DIAGNOSIS — M25562 Pain in left knee: Secondary | ICD-10-CM | POA: Diagnosis not present

## 2015-07-20 DIAGNOSIS — G8929 Other chronic pain: Secondary | ICD-10-CM | POA: Diagnosis not present

## 2015-07-22 DIAGNOSIS — H6501 Acute serous otitis media, right ear: Secondary | ICD-10-CM | POA: Diagnosis not present

## 2015-07-31 DIAGNOSIS — G8929 Other chronic pain: Secondary | ICD-10-CM | POA: Diagnosis not present

## 2015-07-31 DIAGNOSIS — M25562 Pain in left knee: Secondary | ICD-10-CM | POA: Diagnosis not present

## 2015-08-02 DIAGNOSIS — M25562 Pain in left knee: Secondary | ICD-10-CM | POA: Diagnosis not present

## 2015-08-02 DIAGNOSIS — G8929 Other chronic pain: Secondary | ICD-10-CM | POA: Diagnosis not present

## 2015-08-08 DIAGNOSIS — G8929 Other chronic pain: Secondary | ICD-10-CM | POA: Diagnosis not present

## 2015-08-08 DIAGNOSIS — M25562 Pain in left knee: Secondary | ICD-10-CM | POA: Diagnosis not present

## 2015-08-10 DIAGNOSIS — M25562 Pain in left knee: Secondary | ICD-10-CM | POA: Diagnosis not present

## 2015-08-10 DIAGNOSIS — G8929 Other chronic pain: Secondary | ICD-10-CM | POA: Diagnosis not present

## 2015-08-15 DIAGNOSIS — G8929 Other chronic pain: Secondary | ICD-10-CM | POA: Diagnosis not present

## 2015-08-15 DIAGNOSIS — M25562 Pain in left knee: Secondary | ICD-10-CM | POA: Diagnosis not present

## 2015-08-22 DIAGNOSIS — M25562 Pain in left knee: Secondary | ICD-10-CM | POA: Diagnosis not present

## 2015-08-22 DIAGNOSIS — G8929 Other chronic pain: Secondary | ICD-10-CM | POA: Diagnosis not present

## 2015-08-24 DIAGNOSIS — G8929 Other chronic pain: Secondary | ICD-10-CM | POA: Diagnosis not present

## 2015-08-24 DIAGNOSIS — M25562 Pain in left knee: Secondary | ICD-10-CM | POA: Diagnosis not present

## 2015-08-29 DIAGNOSIS — H2513 Age-related nuclear cataract, bilateral: Secondary | ICD-10-CM | POA: Diagnosis not present

## 2015-08-29 DIAGNOSIS — M1712 Unilateral primary osteoarthritis, left knee: Secondary | ICD-10-CM | POA: Diagnosis not present

## 2015-10-07 DIAGNOSIS — H6502 Acute serous otitis media, left ear: Secondary | ICD-10-CM | POA: Diagnosis not present

## 2015-10-18 DIAGNOSIS — Z0000 Encounter for general adult medical examination without abnormal findings: Secondary | ICD-10-CM | POA: Diagnosis not present

## 2015-10-24 DIAGNOSIS — N401 Enlarged prostate with lower urinary tract symptoms: Secondary | ICD-10-CM | POA: Diagnosis not present

## 2015-10-24 DIAGNOSIS — R350 Frequency of micturition: Secondary | ICD-10-CM | POA: Diagnosis not present

## 2015-10-24 DIAGNOSIS — R3914 Feeling of incomplete bladder emptying: Secondary | ICD-10-CM | POA: Diagnosis not present

## 2015-10-24 DIAGNOSIS — R351 Nocturia: Secondary | ICD-10-CM | POA: Diagnosis not present

## 2015-10-26 DIAGNOSIS — D225 Melanocytic nevi of trunk: Secondary | ICD-10-CM | POA: Diagnosis not present

## 2015-10-26 DIAGNOSIS — C44712 Basal cell carcinoma of skin of right lower limb, including hip: Secondary | ICD-10-CM | POA: Diagnosis not present

## 2015-10-26 DIAGNOSIS — D692 Other nonthrombocytopenic purpura: Secondary | ICD-10-CM | POA: Diagnosis not present

## 2015-10-26 DIAGNOSIS — X32XXXA Exposure to sunlight, initial encounter: Secondary | ICD-10-CM | POA: Diagnosis not present

## 2015-10-26 DIAGNOSIS — L570 Actinic keratosis: Secondary | ICD-10-CM | POA: Diagnosis not present

## 2015-10-26 DIAGNOSIS — D2261 Melanocytic nevi of right upper limb, including shoulder: Secondary | ICD-10-CM | POA: Diagnosis not present

## 2015-10-26 DIAGNOSIS — Z85828 Personal history of other malignant neoplasm of skin: Secondary | ICD-10-CM | POA: Diagnosis not present

## 2015-10-26 DIAGNOSIS — D485 Neoplasm of uncertain behavior of skin: Secondary | ICD-10-CM | POA: Diagnosis not present

## 2015-10-26 DIAGNOSIS — C44719 Basal cell carcinoma of skin of left lower limb, including hip: Secondary | ICD-10-CM | POA: Diagnosis not present

## 2015-10-30 DIAGNOSIS — N401 Enlarged prostate with lower urinary tract symptoms: Secondary | ICD-10-CM | POA: Diagnosis not present

## 2015-10-30 DIAGNOSIS — R351 Nocturia: Secondary | ICD-10-CM | POA: Diagnosis not present

## 2015-10-30 DIAGNOSIS — R3914 Feeling of incomplete bladder emptying: Secondary | ICD-10-CM | POA: Diagnosis not present

## 2015-10-30 DIAGNOSIS — R350 Frequency of micturition: Secondary | ICD-10-CM | POA: Diagnosis not present

## 2015-11-08 ENCOUNTER — Encounter: Payer: Self-pay | Admitting: *Deleted

## 2015-11-09 ENCOUNTER — Encounter
Admission: RE | Disposition: A | Discharge status: Home / Self Care | Payer: Self-pay | Source: Ambulatory Visit | Attending: Urology

## 2015-11-09 ENCOUNTER — Ambulatory Visit
Admission: RE | Admit: 2015-11-09 | Discharge: 2015-11-09 | Disposition: A | Discharge status: Home / Self Care | Payer: Commercial Managed Care - HMO | Source: Ambulatory Visit | Attending: Urology | Admitting: Urology

## 2015-11-09 ENCOUNTER — Encounter: Payer: Self-pay | Admitting: *Deleted

## 2015-11-09 DIAGNOSIS — E669 Obesity, unspecified: Secondary | ICD-10-CM | POA: Insufficient documentation

## 2015-11-09 DIAGNOSIS — Z820 Family history of epilepsy and other diseases of the nervous system: Secondary | ICD-10-CM | POA: Insufficient documentation

## 2015-11-09 DIAGNOSIS — E119 Type 2 diabetes mellitus without complications: Secondary | ICD-10-CM | POA: Insufficient documentation

## 2015-11-09 DIAGNOSIS — Z794 Long term (current) use of insulin: Secondary | ICD-10-CM | POA: Diagnosis not present

## 2015-11-09 DIAGNOSIS — Z9889 Other specified postprocedural states: Secondary | ICD-10-CM | POA: Insufficient documentation

## 2015-11-09 DIAGNOSIS — Z6830 Body mass index (BMI) 30.0-30.9, adult: Secondary | ICD-10-CM | POA: Insufficient documentation

## 2015-11-09 DIAGNOSIS — Z7982 Long term (current) use of aspirin: Secondary | ICD-10-CM | POA: Insufficient documentation

## 2015-11-09 DIAGNOSIS — Z79899 Other long term (current) drug therapy: Secondary | ICD-10-CM | POA: Insufficient documentation

## 2015-11-09 DIAGNOSIS — N200 Calculus of kidney: Secondary | ICD-10-CM | POA: Diagnosis not present

## 2015-11-09 HISTORY — PX: EXTRACORPOREAL SHOCK WAVE LITHOTRIPSY: SHX1557

## 2015-11-09 LAB — GLUCOSE, CAPILLARY: GLUCOSE-CAPILLARY: 100 mg/dL — AB (ref 65–99)

## 2015-11-09 SURGERY — LITHOTRIPSY, ESWL
Anesthesia: Moderate Sedation | Laterality: Right

## 2015-11-09 MED ORDER — TAMSULOSIN HCL 0.4 MG PO CAPS: 0.4000 mg | ORAL_CAPSULE | Freq: Every day | ORAL | 11 refills | Status: DC

## 2015-11-09 MED ORDER — LEVOFLOXACIN 500 MG PO TABS
500.0000 mg | ORAL_TABLET | ORAL | Status: AC
  Administered 2015-11-09: 500 mg via ORAL

## 2015-11-09 MED ORDER — MORPHINE SULFATE (PF) 10 MG/ML IV SOLN
INTRAVENOUS | Status: AC
  Administered 2015-11-09: 10 mg via INTRAMUSCULAR
  Filled 2015-11-09: qty 1

## 2015-11-09 MED ORDER — LEVOFLOXACIN 500 MG PO TABS: 500.0000 mg | ORAL_TABLET | Freq: Every day | ORAL | 0 refills | Status: DC

## 2015-11-09 MED ORDER — DIPHENHYDRAMINE HCL 25 MG PO CAPS
ORAL_CAPSULE | ORAL | Status: AC
  Administered 2015-11-09: 25 mg via ORAL
  Filled 2015-11-09: qty 1

## 2015-11-09 MED ORDER — LEVOFLOXACIN 500 MG PO TABS
ORAL_TABLET | ORAL | Status: AC
  Administered 2015-11-09: 500 mg via ORAL
  Filled 2015-11-09: qty 1

## 2015-11-09 MED ORDER — PROMETHAZINE HCL 25 MG/ML IJ SOLN
25.0000 mg | Freq: Once | INTRAMUSCULAR | Status: AC
  Administered 2015-11-09: 25 mg via INTRAMUSCULAR

## 2015-11-09 MED ORDER — MORPHINE SULFATE (PF) 10 MG/ML IV SOLN
10.0000 mg | Freq: Once | INTRAVENOUS | Status: AC
  Administered 2015-11-09: 10 mg via INTRAMUSCULAR

## 2015-11-09 MED ORDER — FUROSEMIDE 10 MG/ML IJ SOLN
10.0000 mg | Freq: Once | INTRAMUSCULAR | Status: AC
  Administered 2015-11-09: 10 mg via INTRAVENOUS

## 2015-11-09 MED ORDER — FUROSEMIDE 10 MG/ML IJ SOLN
INTRAMUSCULAR | Status: AC
  Filled 2015-11-09: qty 2

## 2015-11-09 MED ORDER — DEXTROSE-NACL 5-0.45 % IV SOLN
INTRAVENOUS | Status: DC
  Administered 2015-11-09: 12:00:00 via INTRAVENOUS

## 2015-11-09 MED ORDER — NUCYNTA 50 MG PO TABS: 50.0000 mg | ORAL_TABLET | Freq: Four times a day (QID) | ORAL | 0 refills | Status: DC | PRN

## 2015-11-09 MED ORDER — HYDROCODONE-ACETAMINOPHEN 10-325 MG PO TABS: 1.0000 | ORAL_TABLET | ORAL | 0 refills | Status: DC | PRN

## 2015-11-09 MED ORDER — PROMETHAZINE HCL 25 MG/ML IJ SOLN
INTRAMUSCULAR | Status: AC
  Administered 2015-11-09: 25 mg via INTRAMUSCULAR
  Filled 2015-11-09: qty 1

## 2015-11-09 MED ORDER — ONDANSETRON 8 MG PO TBDP: 8.0000 mg | ORAL_TABLET | Freq: Four times a day (QID) | ORAL | 3 refills | Status: DC | PRN

## 2015-11-09 MED ORDER — MIDAZOLAM HCL 2 MG/2ML IJ SOLN
1.0000 mg | Freq: Once | INTRAMUSCULAR | Status: AC
  Administered 2015-11-09: 1 mg via INTRAMUSCULAR

## 2015-11-09 MED ORDER — MIDAZOLAM HCL 2 MG/2ML IJ SOLN
INTRAMUSCULAR | Status: AC
  Administered 2015-11-09: 1 mg via INTRAMUSCULAR
  Filled 2015-11-09: qty 2

## 2015-11-09 MED ORDER — DIPHENHYDRAMINE HCL 25 MG PO CAPS
25.0000 mg | ORAL_CAPSULE | ORAL | Status: AC
  Administered 2015-11-09: 25 mg via ORAL

## 2015-11-09 NOTE — Discharge Instructions (Signed)
Dietary Guidelines to Help Prevent Kidney Stones °Your risk of kidney stones can be decreased by adjusting the foods you eat. The most important thing you can do is drink enough fluid. You should drink enough fluid to keep your urine clear or pale yellow. The following guidelines provide specific information for the type of kidney stone you have had. °GUIDELINES ACCORDING TO TYPE OF KIDNEY STONE °Calcium Oxalate Kidney Stones °· Reduce the amount of salt you eat. Foods that have a lot of salt cause your body to release excess calcium into your urine. The excess calcium can combine with a substance called oxalate to form kidney stones. °· Reduce the amount of animal protein you eat if the amount you eat is excessive. Animal protein causes your body to release excess calcium into your urine. Ask your dietitian how much protein from animal sources you should be eating. °· Avoid foods that are high in oxalates. If you take vitamins, they should have less than 500 mg of vitamin C. Your body turns vitamin C into oxalates. You do not need to avoid fruits and vegetables high in vitamin C. °Calcium Phosphate Kidney Stones °· Reduce the amount of salt you eat to help prevent the release of excess calcium into your urine. °· Reduce the amount of animal protein you eat if the amount you eat is excessive. Animal protein causes your body to release excess calcium into your urine. Ask your dietitian how much protein from animal sources you should be eating. °· Get enough calcium from food or take a calcium supplement (ask your dietitian for recommendations). Food sources of calcium that do not increase your risk of kidney stones include: °· Broccoli. °· Dairy products, such as cheese and yogurt. °· Pudding. °Uric Acid Kidney Stones °· Do not have more than 6 oz of animal protein per day. °FOOD SOURCES °Animal Protein Sources °· Meat (all types). °· Poultry. °· Eggs. °· Fish, seafood. °Foods High in Salt °· Salt seasonings. °· Soy  sauce. °· Teriyaki sauce. °· Cured and processed meats. °· Salted crackers and snack foods. °· Fast food. °· Canned soups and most canned foods. °Foods High in Oxalates °· Grains: °· Amaranth. °· Barley. °· Grits. °· Wheat germ. °· Bran. °· Buckwheat flour. °· All bran cereals. °· Pretzels. °· Whole wheat bread. °· Vegetables: °· Beans (wax). °· Beets and beet greens. °· Collard greens. °· Eggplant. °· Escarole. °· Leeks. °· Okra. °· Parsley. °· Rutabagas. °· Spinach. °· Swiss chard. °· Tomato paste. °· Fried potatoes. °· Sweet potatoes. °· Fruits: °· Red currants. °· Figs. °· Kiwi. °· Rhubarb. °· Meat and Other Protein Sources: °· Beans (dried). °· Soy burgers and other soybean products. °· Miso. °· Nuts (peanuts, almonds, pecans, cashews, hazelnuts). °· Nut butters. °· Sesame seeds and tahini (paste made of sesame seeds). °· Poppy seeds. °· Beverages: °· Chocolate drink mixes. °· Soy milk. °· Instant iced tea. °· Juices made from high-oxalate fruits or vegetables. °· Other: °· Carob. °· Chocolate. °· Fruitcake. °· Marmalades. °  °This information is not intended to replace advice given to you by your health care provider. Make sure you discuss any questions you have with your health care provider. °  °Document Released: 07/20/2010 Document Revised: 03/30/2013 Document Reviewed: 02/19/2013 °Elsevier Interactive Patient Education ©2016 Elsevier Inc[employer] ° °Kidney Stones °Kidney stones (urolithiasis) are deposits that form inside your kidneys. The intense pain is caused by the stone moving through the urinary tract. When the stone moves, the ureter   goes into spasm around the stone. The stone is usually passed in the urine.  °CAUSES  °· A disorder that makes certain neck glands produce too much parathyroid hormone (primary hyperparathyroidism). °· A buildup of uric acid crystals, similar to gout in your joints. °· Narrowing (stricture) of the ureter. °· A kidney obstruction present at birth (congenital  obstruction). °· Previous surgery on the kidney or ureters. °· Numerous kidney infections. °SYMPTOMS  °· Feeling sick to your stomach (nauseous). °· Throwing up (vomiting). °· Blood in the urine (hematuria). °· Pain that usually spreads (radiates) to the groin. °· Frequency or urgency of urination. °DIAGNOSIS  °· Taking a history and physical exam. °· Blood or urine tests. °· CT scan. °· Occasionally, an examination of the inside of the urinary bladder (cystoscopy) is performed. °TREATMENT  °· Observation. °· Increasing your fluid intake. °· Extracorporeal shock wave lithotripsy--This is a noninvasive procedure that uses shock waves to break up kidney stones. °· Surgery may be needed if you have severe pain or persistent obstruction. There are various surgical procedures. Most of the procedures are performed with the use of small instruments. Only small incisions are needed to accommodate these instruments, so recovery time is minimized. °The size, location, and chemical composition are all important variables that will determine the proper choice of action for you. Talk to your health care provider to better understand your situation so that you will minimize the risk of injury to yourself and your kidney.  °HOME CARE INSTRUCTIONS  °· Drink enough water and fluids to keep your urine clear or pale yellow. This will help you to pass the stone or stone fragments. °· Strain all urine through the provided strainer. Keep all particulate matter and stones for your health care provider to see. The stone causing the pain may be as small as a grain of salt. It is very important to use the strainer each and every time you pass your urine. The collection of your stone will allow your health care provider to analyze it and verify that a stone has actually passed. The stone analysis will often identify what you can do to reduce the incidence of recurrences. °· Only take over-the-counter or prescription medicines for pain,  discomfort, or fever as directed by your health care provider. °· Keep all follow-up visits as told by your health care provider. This is important. °· Get follow-up X-rays if required. The absence of pain does not always mean that the stone has passed. It may have only stopped moving. If the urine remains completely obstructed, it can cause loss of kidney function or even complete destruction of the kidney. It is your responsibility to make sure X-rays and follow-ups are completed. Ultrasounds of the kidney can show blockages and the status of the kidney. Ultrasounds are not associated with any radiation and can be performed easily in a matter of minutes. °· Make changes to your daily diet as told by your health care provider. You may be told to: °· Limit the amount of salt that you eat. °· Eat 5 or more servings of fruits and vegetables each day. °· Limit the amount of meat, poultry, fish, and eggs that you eat. °· Collect a 24-hour urine sample as told by your health care provider. You may need to collect another urine sample every 6-12 months. °SEEK MEDICAL CARE IF: °· You experience pain that is progressive and unresponsive to any pain medicine you have been prescribed. °SEEK IMMEDIATE MEDICAL CARE IF:  °· Pain   cannot be controlled with the prescribed medicine. °· You have a fever or shaking chills. °· The severity or intensity of pain increases over 18 hours and is not relieved by pain medicine. °· You develop a new onset of abdominal pain. °· You feel faint or pass out. °· You are unable to urinate. °  °This information is not intended to replace advice given to you by your health care provider. Make sure you discuss any questions you have with your health care provider. °  °Document Released: 03/25/2005 Document Revised: 12/14/2014 Document Reviewed: 08/26/2012 °Elsevier Interactive Patient Education ©2016 [employer]. ° °Lithotripsy, Care After °Refer to this sheet in the next few weeks. These instructions  provide you with information on caring for yourself after your procedure. Your health care provider may also give you more specific instructions. Your treatment has been planned according to current medical practices, but problems sometimes occur. Call your health care provider if you have any problems or questions after your procedure. °WHAT TO EXPECT AFTER THE PROCEDURE  °· Your urine may have a red tinge for a few days after treatment. Blood loss is usually minimal. °· You may have soreness in the back or flank area. This usually goes away after a few days. The procedure can cause blotches or bruises on the back where the pressure wave enters the skin. These marks usually cause only minimal discomfort and should disappear in a short time. °· Stone fragments should begin to pass within 24 hours of treatment. However, a delayed passage is not unusual. °· You may have pain, discomfort, and feel sick to your stomach (nauseated) when the crushed fragments of stone are passed down the tube from the kidney to the bladder. Stone fragments can pass soon after the procedure and may last for up to 4-8 weeks. °· A small number of patients may have severe pain when stone fragments are not able to pass, which leads to an obstruction. °· If your stone is greater than 1 inch (2.5 cm) in diameter or if you have multiple stones that have a combined diameter greater than 1 inch (2.5 cm), you may require more than one treatment. °· If you had a stent placed prior to your procedure, you may experience some discomfort, especially during urination. You may experience the pain or discomfort in your flank or back, or you may experience a sharp pain or discomfort at the base of your penis or in your lower abdomen. The discomfort usually lasts only a few minutes after urinating. °HOME CARE INSTRUCTIONS  °· Rest at home until you feel your energy improving. °· Only take over-the-counter or prescription medicines for pain, discomfort, or  fever as directed by your health care provider. Depending on the type of lithotripsy, you may need to take antibiotics and anti-inflammatory medicines for a few days. °· Drink enough water and fluids to keep your urine clear or pale yellow. This helps "flush" your kidneys. It helps pass any remaining pieces of stone and prevents stones from coming back. °· Most people can resume daily activities within 1-2 days after standard lithotripsy. It can take longer to recover from laser and percutaneous lithotripsy. °· Strain all urine through the provided strainer. Keep all particulate matter and stones for your health care provider to see. The stone may be as small as a grain of salt. It is very important to use the strainer each and every time you pass your urine. Any stones that are found can be sent to   a medical lab for examination. °· Visit your health care provider for a follow-up appointment in a few weeks. Your doctor may remove your stent if you have one. Your health care provider will also check to see whether stone particles still remain. °SEEK MEDICAL CARE IF:  °· Your pain is not relieved by medicine. °· You have a lasting nauseous feeling. °· You feel there is too much blood in the urine. °· You develop persistent problems with frequent or painful urination that does not at least partially improve after 2 days following the procedure. °· You have a congested cough. °· You feel lightheaded. °· You develop a rash or any other signs that might suggest an allergic problem. °· You develop any reaction or side effects to your medicine(s). °SEEK IMMEDIATE MEDICAL CARE IF:  °· You experience severe back or flank pain or both. °· You see nothing but blood when you urinate. °· You cannot pass any urine at all. °· You have a fever or shaking chills. °· You develop shortness of breath, difficulty breathing, or chest pain. °· You develop vomiting that will not stop after 6-8 hours. °· You have a fainting episode. °  °This  information is not intended to replace advice given to you by your health care provider. Make sure you discuss any questions you have with your health care provider. °  °Document Released: 04/14/2007 Document Revised: 12/14/2014 Document Reviewed: 10/08/2012 °Elsevier Interactive Patient Education ©2016 [employer] Inc. ° °Lithotripsy °Lithotripsy is a treatment that can sometimes help eliminate kidney stones and pain that they cause. A form of lithotripsy, also known as extracorporeal shock wave lithotripsy, is a nonsurgical procedure that helps your body rid itself of the kidney stone when it is too big to pass on its own. Extracorporeal shock wave lithotripsy is a method of crushing a kidney stone with shock waves. These shock waves pass through your body and are focused on your stone. They cause the kidney stones to crumble while still in the urinary tract. It is then easier for the smaller pieces of stone to pass in the urine. °Lithotripsy usually takes about an hour. It is done in a hospital, a lithotripsy center, or a mobile unit. It usually does not require an overnight stay. Your health care provider will instruct you on preparation for the procedure. Your health care provider will tell you what to expect afterward. °LET YOUR HEALTH CARE PROVIDER KNOW ABOUT: °· Any allergies you have. °· All medicines you are taking, including vitamins, herbs, eye drops, creams, and over-the-counter medicines. °· Previous problems you or members of your family have had with the use of anesthetics. °· Any blood disorders you have. °· Previous surgeries you have had. °· Medical conditions you have. °RISKS AND COMPLICATIONS °Generally, lithotripsy for kidney stones is a safe procedure. However, as with any procedure, complications can occur. Possible complications include: °· Infection. °· Bleeding of the kidney. °· Bruising of the kidney or skin. °· Obstruction of the ureter. °· Failure of the stone to fragment. °BEFORE THE  PROCEDURE °· Do not eat or drink for 6-8 hours prior to the procedure. You may, however, take the medications with a sip of water that your physician instructs you to take °· Do not take aspirin or aspirin-containing products for 7 days prior to your procedure °· Do not take nonsteroidal anti-inflammatory products for 7 days prior to your procedure °PROCEDURE °A stent (flexible tube with holes) may be placed in your ureter. The ureter is   the tube that transports the urine from the kidneys to the bladder. Your health care provider may place a stent before the procedure. This will help keep urine flowing from the kidney if the fragments of the stone block the ureter. You may have an IV tube placed in one of your veins to give you fluids and medicines. These medicines may help you relax or make you sleep. During the procedure, you will lie comfortably on a fluid-filled cushion or in a warm-water bath. After an X-ray or ultrasound exam to locate your stone, shock waves are aimed at the stone. If you are awake, you may feel a tapping sensation as the shock waves pass through your body. If large stone particles remain after treatment, a second procedure may be necessary at a later date. °For comfort during the test: °· Relax as much as possible. °· Try to remain still as much as possible. °· Try to follow instructions to speed up the test. °· Let your health care provider know if you are uncomfortable, anxious, or in pain. °AFTER THE PROCEDURE  °After surgery, you will be taken to the recovery area. A nurse will watch and check your progress. Once you're awake, stable, and taking fluids well, you will be allowed to go home as long as there are no problems. You will also be allowed to pass your urine before discharge. You may be given antibiotics to help prevent infection. You may also be prescribed pain medicine if needed. In a week or two, your health care provider may remove your stent, if you have one. You may first  have an X-ray exam to check on how successful the fragmentation of your stone has been and how much of the stone has passed. Your health care provider will check to see whether or not stone particles remain. °SEEK IMMEDIATE MEDICAL CARE IF: °· You develop a fever or shaking chills. °· Your pain is not relieved by medicine. °· You feel sick to your stomach (nauseated) and you vomit. °· You develop heavy bleeding. °· You have difficulty urinating. °· You start to pass your stent from your penis. °  °This information is not intended to replace advice given to you by your health care provider. Make sure you discuss any questions you have with your health care provider. °  °Document Released: 03/22/2000 Document Revised: 04/15/2014 Document Reviewed: 10/08/2012 °Elsevier Interactive Patient Education ©2016 [employer]. ° °Renal Colic °Renal colic is pain that is caused by passing a kidney stone. The pain can be sharp and severe. It may be felt in the back, abdomen, side (flank), or groin. It can cause nausea. Renal colic can come and go. °HOME CARE INSTRUCTIONS °Watch your condition for any changes. The following actions may help to lessen any discomfort that you are feeling: °· Take medicines only as directed by your health care provider. °· Ask your health care provider if it is okay to take over-the-counter pain medicine. °· Drink enough fluid to keep your urine clear or pale yellow. Drink 6-8 glasses of water each day. °· Limit the amount of salt that you eat to less than 2 grams per day. °· Reduce the amount of protein in your diet. Eat less meat, fish, nuts, and dairy. °· Avoid foods such as spinach, rhubarb, nuts, or bran. These may make kidney stones more likely to form. °SEEK MEDICAL CARE IF: °· You have a fever or chills. °· Your urine smells bad or looks cloudy. °· You have pain or   burning when you pass urine. °SEEK IMMEDIATE MEDICAL CARE IF: °· Your flank pain or groin pain suddenly worsens. °· You become  confused or disoriented or you lose consciousness. °  °This information is not intended to replace advice given to you by your health care provider. Make sure you discuss any questions you have with your health care provider. °  °Document Released: 01/02/2005 Document Revised: 04/15/2014 Document Reviewed: 02/02/2014 °Elsevier Interactive Patient Education ©2016 [employer]vier Inc. ° °

## 2015-11-21 DIAGNOSIS — N200 Calculus of kidney: Secondary | ICD-10-CM | POA: Diagnosis not present

## 2015-12-01 DIAGNOSIS — I491 Atrial premature depolarization: Secondary | ICD-10-CM | POA: Diagnosis not present

## 2015-12-01 DIAGNOSIS — I10 Essential (primary) hypertension: Secondary | ICD-10-CM | POA: Diagnosis not present

## 2015-12-01 DIAGNOSIS — E785 Hyperlipidemia, unspecified: Secondary | ICD-10-CM | POA: Diagnosis not present

## 2015-12-19 DIAGNOSIS — C44712 Basal cell carcinoma of skin of right lower limb, including hip: Secondary | ICD-10-CM | POA: Diagnosis not present

## 2015-12-19 DIAGNOSIS — C44719 Basal cell carcinoma of skin of left lower limb, including hip: Secondary | ICD-10-CM | POA: Diagnosis not present

## 2015-12-24 DIAGNOSIS — R05 Cough: Secondary | ICD-10-CM | POA: Diagnosis not present

## 2015-12-26 DIAGNOSIS — M722 Plantar fascial fibromatosis: Secondary | ICD-10-CM | POA: Diagnosis not present

## 2015-12-26 DIAGNOSIS — M79671 Pain in right foot: Secondary | ICD-10-CM | POA: Diagnosis not present

## 2015-12-26 DIAGNOSIS — M7752 Other enthesopathy of left foot: Secondary | ICD-10-CM | POA: Diagnosis not present

## 2015-12-26 DIAGNOSIS — M79672 Pain in left foot: Secondary | ICD-10-CM | POA: Diagnosis not present

## 2016-01-10 ENCOUNTER — Encounter
Admission: RE | Disposition: A | Discharge status: Home / Self Care | Payer: Self-pay | Source: Ambulatory Visit | Attending: Unknown Physician Specialty

## 2016-01-10 ENCOUNTER — Ambulatory Visit: Payer: Commercial Managed Care - HMO | Admitting: Anesthesiology

## 2016-01-10 ENCOUNTER — Ambulatory Visit
Admission: RE | Admit: 2016-01-10 | Discharge: 2016-01-10 | Disposition: A | Discharge status: Home / Self Care | Payer: Commercial Managed Care - HMO | Source: Ambulatory Visit | Attending: Unknown Physician Specialty | Admitting: Unknown Physician Specialty

## 2016-01-10 ENCOUNTER — Encounter: Payer: Self-pay | Admitting: *Deleted

## 2016-01-10 DIAGNOSIS — H409 Unspecified glaucoma: Secondary | ICD-10-CM | POA: Insufficient documentation

## 2016-01-10 DIAGNOSIS — M1990 Unspecified osteoarthritis, unspecified site: Secondary | ICD-10-CM | POA: Diagnosis not present

## 2016-01-10 DIAGNOSIS — K5730 Diverticulosis of large intestine without perforation or abscess without bleeding: Secondary | ICD-10-CM | POA: Insufficient documentation

## 2016-01-10 DIAGNOSIS — D120 Benign neoplasm of cecum: Secondary | ICD-10-CM | POA: Diagnosis not present

## 2016-01-10 DIAGNOSIS — N402 Nodular prostate without lower urinary tract symptoms: Secondary | ICD-10-CM | POA: Insufficient documentation

## 2016-01-10 DIAGNOSIS — Z791 Long term (current) use of non-steroidal anti-inflammatories (NSAID): Secondary | ICD-10-CM | POA: Diagnosis not present

## 2016-01-10 DIAGNOSIS — D127 Benign neoplasm of rectosigmoid junction: Secondary | ICD-10-CM | POA: Diagnosis not present

## 2016-01-10 DIAGNOSIS — Z87891 Personal history of nicotine dependence: Secondary | ICD-10-CM | POA: Insufficient documentation

## 2016-01-10 DIAGNOSIS — Z86010 Personal history of colonic polyps: Secondary | ICD-10-CM | POA: Diagnosis not present

## 2016-01-10 DIAGNOSIS — N189 Chronic kidney disease, unspecified: Secondary | ICD-10-CM | POA: Insufficient documentation

## 2016-01-10 DIAGNOSIS — K5140 Inflammatory polyps of colon without complications: Secondary | ICD-10-CM | POA: Diagnosis not present

## 2016-01-10 DIAGNOSIS — Z794 Long term (current) use of insulin: Secondary | ICD-10-CM | POA: Insufficient documentation

## 2016-01-10 DIAGNOSIS — Z8719 Personal history of other diseases of the digestive system: Secondary | ICD-10-CM | POA: Diagnosis not present

## 2016-01-10 DIAGNOSIS — E1122 Type 2 diabetes mellitus with diabetic chronic kidney disease: Secondary | ICD-10-CM | POA: Diagnosis not present

## 2016-01-10 DIAGNOSIS — D128 Benign neoplasm of rectum: Secondary | ICD-10-CM | POA: Diagnosis not present

## 2016-01-10 DIAGNOSIS — D122 Benign neoplasm of ascending colon: Secondary | ICD-10-CM | POA: Diagnosis not present

## 2016-01-10 DIAGNOSIS — Z1211 Encounter for screening for malignant neoplasm of colon: Secondary | ICD-10-CM | POA: Diagnosis not present

## 2016-01-10 DIAGNOSIS — E785 Hyperlipidemia, unspecified: Secondary | ICD-10-CM | POA: Insufficient documentation

## 2016-01-10 DIAGNOSIS — K5790 Diverticulosis of intestine, part unspecified, without perforation or abscess without bleeding: Secondary | ICD-10-CM | POA: Diagnosis not present

## 2016-01-10 DIAGNOSIS — Z7982 Long term (current) use of aspirin: Secondary | ICD-10-CM | POA: Diagnosis not present

## 2016-01-10 DIAGNOSIS — I129 Hypertensive chronic kidney disease with stage 1 through stage 4 chronic kidney disease, or unspecified chronic kidney disease: Secondary | ICD-10-CM | POA: Diagnosis not present

## 2016-01-10 DIAGNOSIS — Z7951 Long term (current) use of inhaled steroids: Secondary | ICD-10-CM | POA: Insufficient documentation

## 2016-01-10 DIAGNOSIS — Z79899 Other long term (current) drug therapy: Secondary | ICD-10-CM | POA: Diagnosis not present

## 2016-01-10 DIAGNOSIS — K635 Polyp of colon: Secondary | ICD-10-CM | POA: Diagnosis not present

## 2016-01-10 DIAGNOSIS — Z6830 Body mass index (BMI) 30.0-30.9, adult: Secondary | ICD-10-CM | POA: Diagnosis not present

## 2016-01-10 HISTORY — PX: COLONOSCOPY WITH PROPOFOL: SHX5780

## 2016-01-10 LAB — GLUCOSE, CAPILLARY: GLUCOSE-CAPILLARY: 175 mg/dL — AB (ref 65–99)

## 2016-01-10 SURGERY — COLONOSCOPY WITH PROPOFOL
Anesthesia: General

## 2016-01-10 MED ORDER — PHENYLEPHRINE HCL 10 MG/ML IJ SOLN
INTRAMUSCULAR | Status: DC | PRN
  Administered 2016-01-10 (×2): 100 ug via INTRAVENOUS

## 2016-01-10 MED ORDER — PROPOFOL 500 MG/50ML IV EMUL
INTRAVENOUS | Status: DC | PRN
  Administered 2016-01-10: 120 ug/kg/min via INTRAVENOUS

## 2016-01-10 MED ORDER — MIDAZOLAM HCL 2 MG/2ML IJ SOLN
INTRAMUSCULAR | Status: DC | PRN
  Administered 2016-01-10: 1 mg via INTRAVENOUS

## 2016-01-10 MED ORDER — SODIUM CHLORIDE 0.9 % IV SOLN
INTRAVENOUS | Status: DC
  Administered 2016-01-10: 1000 mL via INTRAVENOUS

## 2016-01-10 MED ORDER — PROPOFOL 10 MG/ML IV BOLUS
INTRAVENOUS | Status: DC | PRN
  Administered 2016-01-10: 120 mg via INTRAVENOUS

## 2016-01-10 MED ORDER — FENTANYL CITRATE (PF) 100 MCG/2ML IJ SOLN
INTRAMUSCULAR | Status: DC | PRN
  Administered 2016-01-10: 50 ug via INTRAVENOUS

## 2016-01-10 NOTE — Op Note (Signed)
[facility] Patient Name: Henry [name] Procedure Date: 01/10/2016 8:18 AM MRN: [id number] Account #: [id number] Date of Birth: 1946/03/14 Admit Type: Outpatient Age: 70 Room: [facility] ENDO ROOM 4 Gender: Male Note Status: Finalized Procedure:            Colonoscopy Indications:          High risk colon cancer surveillance: Personal history                        of colonic polyps Providers:            Manya Silvas, MD Referring MD:         Youlanda Roys. Lovie Macadamia, MD (Referring MD) Medicines:            Propofol per Anesthesia Complications:        No immediate complications. Procedure:            Pre-Anesthesia Assessment:                       - After reviewing the risks and benefits, the patient                        was deemed in satisfactory condition to undergo the                        procedure.                       After obtaining informed consent, the colonoscope was                        passed under direct vision. Throughout the procedure,                        the patient's blood pressure, pulse, and oxygen                        saturations were monitored continuously. The                        Colonoscope was introduced through the anus and                        advanced to the the cecum, identified by appendiceal                        orifice and ileocecal valve. The colonoscopy was                        performed without difficulty. The patient tolerated the                        procedure well. The quality of the bowel preparation                        was good. Findings:      A small polyp was found in the rectum. The polyp was sessile. The polyp       was removed with a hot snare. Resection and retrieval were complete.      Two sessile polyps were found in the recto-sigmoid colon. The polyps  were diminutive in size. These polyps were removed with a jumbo cold       forceps. Resection and retrieval were  complete.      A diminutive polyp was found in the ascending colon. The polyp was       sessile. The polyp was removed with a jumbo cold forceps. Resection and       retrieval were complete.      A diminutive polyp was found in the cecum. The polyp was sessile. The       polyp was removed with a jumbo cold forceps. Resection and retrieval       were complete.      A large polyp was found in the cecum. 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 It is not       able to be removed with endoscopic technique and will likely require       surgery if it is adenomatous tissue. Biopsies done of this.      left side of prostate has a firm nodular area in the distal prostate. Impression:           - One small polyp in the rectum, removed with a hot                        snare. Resected and retrieved.                       - Two diminutive polyps at the recto-sigmoid colon,                        removed with a jumbo cold forceps. Resected and                        retrieved.                       - One diminutive polyp in the ascending colon, removed                        with a jumbo cold forceps. Resected and retrieved.                       - One diminutive polyp in the cecum, removed with a                        jumbo cold forceps. Resected and retrieved.                       - One large polyp in the cecum.                       Prostatic abnormality Recommendation:       - Await pathology results. Manya Silvas, MD 01/10/2016 9:15:12 AM This report has been signed electronically. Number of Addenda: 0 Note Initiated On: 01/10/2016 8:18 AM Scope Withdrawal Time: 0 hours 19 minutes 49 seconds  Total Procedure Duration: 0 hours 28 minutes 35 seconds       [facility]

## 2016-01-10 NOTE — Anesthesia Preprocedure Evaluation (Signed)
Anesthesia Evaluation  Patient identified by MRN, date of birth, ID band Patient awake    Reviewed: Allergy & Precautions, NPO status , Patient's Chart, lab work & pertinent test results  Airway Mallampati: III  TM Distance: >3 FB     Dental  (+) Poor Dentition, Missing   Pulmonary neg pulmonary ROS, former smoker,    breath sounds clear to auscultation       Cardiovascular Exercise Tolerance: Good hypertension, Pt. on medications  Rhythm:Regular     Neuro/Psych negative neurological ROS     GI/Hepatic negative GI ROS, Neg liver ROS,   Endo/Other  diabetes, Well Controlled, Type 1, Insulin DependentMorbid obesity  Renal/GU      Musculoskeletal   Abdominal (+) + obese,   Peds  Hematology   Anesthesia Other Findings   Reproductive/Obstetrics                             Anesthesia Physical Anesthesia Plan  ASA: II  Anesthesia Plan: General   Post-op Pain Management:    Induction: Intravenous  Airway Management Planned: Natural Airway and Nasal Cannula  Additional Equipment:   Intra-op Plan:   Post-operative Plan:   Informed Consent: I have reviewed the patients History and Physical, chart, labs and discussed the procedure including the risks, benefits and alternatives for the proposed anesthesia with the patient or authorized representative who has indicated his/her understanding and acceptance.     Plan Discussed with:   Anesthesia Plan Comments:         Anesthesia Quick Evaluation

## 2016-01-10 NOTE — H&P (Signed)
Primary Care Physician:  Juluis Pitch, MD Primary Gastroenterologist:  Dr. Vira Agar  Pre-Procedure History & Physical: HPI:  Henry [name] is a 70 y.o. male is here for an colonoscopy.   Past Medical History:  Diagnosis Date  . Arthritis   . Cancer ([place])    skin  . Chronic kidney disease   . Diabetes mellitus without complication ([place])   . Glaucoma   . Hyperlipidemia   . Hypertension     Past Surgical History:  Procedure Laterality Date  . COLONOSCOPY WITH PROPOFOL N/A 12/08/2014   Procedure: COLONOSCOPY WITH PROPOFOL;  Surgeon: Manya Silvas, MD;  Location: [facility] ENDOSCOPY;  Service: Endoscopy;  Laterality: N/A;  . EXTRACORPOREAL SHOCK WAVE LITHOTRIPSY Right 11/09/2015   Procedure: EXTRACORPOREAL SHOCK WAVE LITHOTRIPSY (ESWL);  Surgeon: Royston Cowper, MD;  Location: [facility] ORS;  Service: Urology;  Laterality: Right;  . LITHOTRIPSY     x 6  . PAROTIDECTOMY  2015  . TONSILLECTOMY      Prior to Admission medications   Medication Sig Start Date End Date Taking? Authorizing Provider  meloxicam (MOBIC) 15 MG tablet Take 15 mg by mouth daily.   Yes Historical Provider, MD  valsartan (DIOVAN) 320 MG tablet Take 320 mg by mouth daily.   Yes Historical Provider, MD  aspirin EC 81 MG tablet Take 81 mg by mouth daily.    Historical Provider, MD  cetirizine (ZYRTEC) 10 MG tablet Take 10 mg by mouth daily.    Historical Provider, MD  fluticasone (FLONASE) 50 MCG/ACT nasal spray Place 1 spray into both nostrils daily.    Historical Provider, MD  HYDROcodone-acetaminophen (NORCO) 10-325 MG tablet Take 1-2 tablets by mouth every 4 (four) hours as needed for moderate pain. Maximum dose per 24 hours -8 pills. 11/09/15   Royston Cowper, MD  insulin detemir (LEVEMIR) 100 UNIT/ML injection Inject 100 Units into the skin 2 (two) times daily.     Historical Provider, MD  latanoprost (XALATAN) 0.005 % ophthalmic solution Place 1 drop into both eyes at bedtime.    Historical Provider, MD   multivitamin-iron-minerals-folic acid (CENTRUM) chewable tablet Chew 1 tablet by mouth daily.    Historical Provider, MD  simvastatin (ZOCOR) 40 MG tablet Take 40 mg by mouth daily.    Historical Provider, MD  sitaGLIPtin-metformin (JANUMET) 50-1000 MG per tablet Take 1 tablet by mouth 2 (two) times daily with a meal.    Historical Provider, MD    Allergies as of 11/23/2015  . (No Known Allergies)    History reviewed. No pertinent family history.  Social History   Social History  . Marital status: Single    Spouse name: N/A  . Number of children: N/A  . Years of education: N/A   Occupational History  . Not on file.   Social History Main Topics  . Smoking status: Former Research scientist (life sciences)  . Smokeless tobacco: Never Used  . Alcohol use No  . Drug use: No  . Sexual activity: Not on file   Other Topics Concern  . Not on file   Social History Narrative  . No narrative on file    Review of Systems: See HPI, otherwise negative ROS  Physical Exam: BP 104/88   Pulse 85   Temp (!) 96.1 F (35.6 C) (Tympanic)   Resp (!) 22   Ht 5\' 11"  (1.803 m)   Wt 99.8 kg (220 lb)   SpO2 100%   BMI 30.68 kg/m  General:   Alert,  pleasant  and cooperative in NAD Head:  Normocephalic and atraumatic. Neck:  Supple; no masses or thyromegaly. Lungs:  Clear throughout to auscultation.    Heart:  Regular rate and rhythm. Abdomen:  Soft, nontender and nondistended. Normal bowel sounds, without guarding, and without rebound.   Neurologic:  Alert and  oriented x4;  grossly normal neurologically.  Impression/Plan: Henry [name] is here for an colonoscopy to be performed for [facility] colon polyps  Risks, benefits, limitations, and alternatives regarding  colonoscopy have been reviewed with the patient.  Questions have been answered.  All parties agreeable.   Gaylyn Cheers, MD  01/10/2016, 3:40 PM

## 2016-01-10 NOTE — Transfer of Care (Signed)
Immediate Anesthesia Transfer of Care Note  Patient: Henry [name]  Procedure(s) Performed: Procedure(s): COLONOSCOPY WITH PROPOFOL (N/A)  Patient Location: PACU  Anesthesia Type:General  Level of Consciousness: awake and alert   Airway & Oxygen Therapy: Patient Spontanous Breathing and Patient connected to nasal cannula oxygen  Post-op Assessment: Report given to RN and Post -op Vital signs reviewed and stable  Post vital signs: Reviewed  Last Vitals:  Vitals:   01/10/16 0729  BP: (!) 148/84  Pulse: 90  Resp: 16  Temp: 36.7 C    Last Pain:  Vitals:   01/10/16 0729  TempSrc: Oral  PainSc: 2          Complications: No apparent anesthesia complications

## 2016-01-10 NOTE — Anesthesia Postprocedure Evaluation (Signed)
Anesthesia Post Note  Patient: Henry [name]  Procedure(s) Performed: Procedure(s) (LRB): COLONOSCOPY WITH PROPOFOL (N/A)  Patient location during evaluation: PACU Anesthesia Type: General Level of consciousness: awake Pain management: pain level controlled Vital Signs Assessment: post-procedure vital signs reviewed and stable Respiratory status: spontaneous breathing Anesthetic complications: no    Last Vitals:  Vitals:   01/10/16 0911 01/10/16 0920  BP: 117/75 104/88  Pulse: 85   Resp: (!) 22   Temp: (!) 35.6 C     Last Pain:  Vitals:   01/10/16 0911  TempSrc: Tympanic  PainSc:                  VAN STAVEREN,Boleslaw Borghi

## 2016-01-11 ENCOUNTER — Encounter: Payer: Self-pay | Admitting: Unknown Physician Specialty

## 2016-01-12 LAB — SURGICAL PATHOLOGY

## 2016-01-16 DIAGNOSIS — N401 Enlarged prostate with lower urinary tract symptoms: Secondary | ICD-10-CM | POA: Diagnosis not present

## 2016-01-16 DIAGNOSIS — N200 Calculus of kidney: Secondary | ICD-10-CM | POA: Diagnosis not present

## 2016-01-18 DIAGNOSIS — J069 Acute upper respiratory infection, unspecified: Secondary | ICD-10-CM | POA: Diagnosis not present

## 2016-01-23 DIAGNOSIS — R05 Cough: Secondary | ICD-10-CM | POA: Diagnosis not present

## 2016-01-23 DIAGNOSIS — R509 Fever, unspecified: Secondary | ICD-10-CM | POA: Diagnosis not present

## 2016-01-23 DIAGNOSIS — R009 Unspecified abnormalities of heart beat: Secondary | ICD-10-CM | POA: Diagnosis not present

## 2016-01-25 ENCOUNTER — Inpatient Hospital Stay
Admission: EM | Admit: 2016-01-25 | Discharge: 2016-01-27 | DRG: 193 | Disposition: A | Discharge status: Home / Self Care | Payer: Commercial Managed Care - HMO | Attending: Internal Medicine | Admitting: Internal Medicine

## 2016-01-25 ENCOUNTER — Encounter: Payer: Self-pay | Admitting: *Deleted

## 2016-01-25 ENCOUNTER — Emergency Department: Payer: Commercial Managed Care - HMO

## 2016-01-25 DIAGNOSIS — J9601 Acute respiratory failure with hypoxia: Secondary | ICD-10-CM | POA: Diagnosis not present

## 2016-01-25 DIAGNOSIS — I248 Other forms of acute ischemic heart disease: Secondary | ICD-10-CM | POA: Diagnosis not present

## 2016-01-25 DIAGNOSIS — Z8249 Family history of ischemic heart disease and other diseases of the circulatory system: Secondary | ICD-10-CM | POA: Diagnosis not present

## 2016-01-25 DIAGNOSIS — H409 Unspecified glaucoma: Secondary | ICD-10-CM | POA: Diagnosis present

## 2016-01-25 DIAGNOSIS — I10 Essential (primary) hypertension: Secondary | ICD-10-CM | POA: Diagnosis not present

## 2016-01-25 DIAGNOSIS — Z23 Encounter for immunization: Secondary | ICD-10-CM

## 2016-01-25 DIAGNOSIS — Z794 Long term (current) use of insulin: Secondary | ICD-10-CM | POA: Diagnosis not present

## 2016-01-25 DIAGNOSIS — Z7982 Long term (current) use of aspirin: Secondary | ICD-10-CM | POA: Diagnosis not present

## 2016-01-25 DIAGNOSIS — E785 Hyperlipidemia, unspecified: Secondary | ICD-10-CM | POA: Diagnosis not present

## 2016-01-25 DIAGNOSIS — Z79899 Other long term (current) drug therapy: Secondary | ICD-10-CM | POA: Diagnosis not present

## 2016-01-25 DIAGNOSIS — E119 Type 2 diabetes mellitus without complications: Secondary | ICD-10-CM

## 2016-01-25 DIAGNOSIS — D72829 Elevated white blood cell count, unspecified: Secondary | ICD-10-CM | POA: Diagnosis not present

## 2016-01-25 DIAGNOSIS — Z87891 Personal history of nicotine dependence: Secondary | ICD-10-CM | POA: Diagnosis not present

## 2016-01-25 DIAGNOSIS — J189 Pneumonia, unspecified organism: Principal | ICD-10-CM | POA: Diagnosis present

## 2016-01-25 DIAGNOSIS — R0602 Shortness of breath: Secondary | ICD-10-CM | POA: Diagnosis not present

## 2016-01-25 LAB — BASIC METABOLIC PANEL
Anion gap: 11 (ref 5–15)
BUN: 22 mg/dL — AB (ref 6–20)
CHLORIDE: 97 mmol/L — AB (ref 101–111)
CO2: 27 mmol/L (ref 22–32)
CREATININE: 1.12 mg/dL (ref 0.61–1.24)
Calcium: 10.4 mg/dL — ABNORMAL HIGH (ref 8.9–10.3)
GFR calc Af Amer: >60 mL/min (ref >60)
GFR calc non Af Amer: >60 mL/min (ref >60)
Glucose, Bld: 97 mg/dL (ref 65–99)
POTASSIUM: 4.4 mmol/L (ref 3.5–5.1)
SODIUM: 135 mmol/L (ref 135–145)

## 2016-01-25 LAB — URINALYSIS COMPLETE WITH MICROSCOPIC (ARMC ONLY)
Bilirubin Urine: NEGATIVE
Glucose, UA: NEGATIVE mg/dL
HGB URINE DIPSTICK: NEGATIVE
Ketones, ur: NEGATIVE mg/dL
LEUKOCYTES UA: NEGATIVE
NITRITE: NEGATIVE
PROTEIN: NEGATIVE mg/dL
SPECIFIC GRAVITY, URINE: 1.014 (ref 1.005–1.030)
pH: 5 (ref 5.0–8.0)

## 2016-01-25 LAB — GLUCOSE, CAPILLARY
Glucose-Capillary: 271 mg/dL — ABNORMAL HIGH (ref 65–99)
Glucose-Capillary: 79 mg/dL (ref 65–99)

## 2016-01-25 LAB — CBC
HEMATOCRIT: 49.9 % (ref 40.0–52.0)
Hemoglobin: 16.6 g/dL (ref 13.0–18.0)
MCH: 29.5 pg (ref 26.0–34.0)
MCHC: 33.3 g/dL (ref 32.0–36.0)
MCV: 88.5 fL (ref 80.0–100.0)
Platelets: 392 10*3/uL (ref 150–440)
RBC: 5.63 MIL/uL (ref 4.40–5.90)
RDW: 14.3 % (ref 11.5–14.5)
WBC: 17.5 10*3/uL — AB (ref 3.8–10.6)

## 2016-01-25 LAB — LACTIC ACID, PLASMA: LACTIC ACID, VENOUS: 1.9 mmol/L (ref 0.5–1.9)

## 2016-01-25 MED ORDER — IPRATROPIUM-ALBUTEROL 0.5-2.5 (3) MG/3ML IN SOLN
3.0000 mL | RESPIRATORY_TRACT | Status: DC | PRN
  Administered 2016-01-26: 3 mL via RESPIRATORY_TRACT
  Filled 2016-01-25: qty 3

## 2016-01-25 MED ORDER — IRBESARTAN 150 MG PO TABS
300.0000 mg | ORAL_TABLET | Freq: Every day | ORAL | Status: DC
  Administered 2016-01-26 – 2016-01-27 (×2): 300 mg via ORAL
  Filled 2016-01-25 (×3): qty 2

## 2016-01-25 MED ORDER — ENOXAPARIN SODIUM 40 MG/0.4ML ~~LOC~~ SOLN
40.0000 mg | SUBCUTANEOUS | Status: DC
  Administered 2016-01-26: 40 mg via SUBCUTANEOUS
  Filled 2016-01-25: qty 0.4

## 2016-01-25 MED ORDER — INSULIN ASPART 100 UNIT/ML ~~LOC~~ SOLN
0.0000 [IU] | Freq: Every day | SUBCUTANEOUS | Status: DC
  Administered 2016-01-26: 2 [IU] via SUBCUTANEOUS
  Administered 2016-01-26: 3 [IU] via SUBCUTANEOUS
  Filled 2016-01-25 (×2): qty 3

## 2016-01-25 MED ORDER — INFLUENZA VAC SPLIT QUAD 0.5 ML IM SUSY
0.5000 mL | PREFILLED_SYRINGE | INTRAMUSCULAR | Status: AC
  Administered 2016-01-26: 0.5 mL via INTRAMUSCULAR
  Filled 2016-01-25: qty 0.5

## 2016-01-25 MED ORDER — IOPAMIDOL (ISOVUE-370) INJECTION 76%
75.0000 mL | Freq: Once | INTRAVENOUS | Status: AC | PRN
  Administered 2016-01-25: 75 mL via INTRAVENOUS

## 2016-01-25 MED ORDER — LATANOPROST 0.005 % OP SOLN
1.0000 [drp] | Freq: Every day | OPHTHALMIC | Status: DC
  Filled 2016-01-25: qty 2.5

## 2016-01-25 MED ORDER — DEXTROSE 5 % IV SOLN: 1.0000 g | Freq: Once | INTRAVENOUS | Status: DC

## 2016-01-25 MED ORDER — ONDANSETRON HCL 4 MG/2ML IJ SOLN: 4.0000 mg | Freq: Four times a day (QID) | INTRAMUSCULAR | Status: DC | PRN

## 2016-01-25 MED ORDER — DEXTROSE 5 % IV SOLN: 500.0000 mg | INTRAVENOUS | Status: DC

## 2016-01-25 MED ORDER — SIMVASTATIN 20 MG PO TABS
40.0000 mg | ORAL_TABLET | Freq: Every day | ORAL | Status: DC
  Administered 2016-01-26 – 2016-01-27 (×2): 40 mg via ORAL
  Filled 2016-01-25 (×2): qty 2

## 2016-01-25 MED ORDER — ACETAMINOPHEN 650 MG RE SUPP: 650.0000 mg | Freq: Four times a day (QID) | RECTAL | Status: DC | PRN

## 2016-01-25 MED ORDER — HYDROCODONE-ACETAMINOPHEN 10-325 MG PO TABS: 1.0000 | ORAL_TABLET | ORAL | Status: DC | PRN

## 2016-01-25 MED ORDER — ONDANSETRON HCL 4 MG PO TABS: 4.0000 mg | ORAL_TABLET | Freq: Four times a day (QID) | ORAL | Status: DC | PRN

## 2016-01-25 MED ORDER — ASPIRIN EC 81 MG PO TBEC
81.0000 mg | DELAYED_RELEASE_TABLET | Freq: Every day | ORAL | Status: DC
  Administered 2016-01-26 – 2016-01-27 (×2): 81 mg via ORAL
  Filled 2016-01-25 (×2): qty 1

## 2016-01-25 MED ORDER — METHYLPREDNISOLONE SODIUM SUCC 125 MG IJ SOLR
125.0000 mg | Freq: Once | INTRAMUSCULAR | Status: AC
  Administered 2016-01-25: 125 mg via INTRAVENOUS
  Filled 2016-01-25: qty 2

## 2016-01-25 MED ORDER — CEFTRIAXONE SODIUM-DEXTROSE 1-3.74 GM-% IV SOLR
1.0000 g | Freq: Once | INTRAVENOUS | Status: AC
  Administered 2016-01-25: 1 g via INTRAVENOUS
  Filled 2016-01-25: qty 50

## 2016-01-25 MED ORDER — IPRATROPIUM-ALBUTEROL 0.5-2.5 (3) MG/3ML IN SOLN
3.0000 mL | Freq: Once | RESPIRATORY_TRACT | Status: AC
  Administered 2016-01-25: 3 mL via RESPIRATORY_TRACT
  Filled 2016-01-25: qty 3

## 2016-01-25 MED ORDER — ACETAMINOPHEN 325 MG PO TABS
650.0000 mg | ORAL_TABLET | Freq: Four times a day (QID) | ORAL | Status: DC | PRN
  Administered 2016-01-26: 650 mg via ORAL
  Filled 2016-01-25: qty 2

## 2016-01-25 MED ORDER — INSULIN ASPART 100 UNIT/ML ~~LOC~~ SOLN
0.0000 [IU] | Freq: Three times a day (TID) | SUBCUTANEOUS | Status: DC
  Administered 2016-01-26: 5 [IU] via SUBCUTANEOUS
  Administered 2016-01-26 (×2): 3 [IU] via SUBCUTANEOUS
  Filled 2016-01-25 (×2): qty 3

## 2016-01-25 MED ORDER — DEXTROSE 5 % IV SOLN
500.0000 mg | Freq: Once | INTRAVENOUS | Status: AC
  Administered 2016-01-25: 500 mg via INTRAVENOUS
  Filled 2016-01-25: qty 500

## 2016-01-25 MED ORDER — DEXTROSE 5 % IV SOLN: 1.0000 g | INTRAVENOUS | Status: DC

## 2016-01-25 NOTE — ED Notes (Signed)
Pt ambulated in hallway while maintaining a O2 saturation between 92-93%.

## 2016-01-25 NOTE — H&P (Signed)
[place] [facility] [place] NAME: Henry [name]    MR#:  [id number]  DATE OF BIRTH:  [birth year]  DATE OF ADMISSION:  01/25/2016  PRIMARY CARE PHYSICIAN: Juluis Pitch, MD   REQUESTING/REFERRING PHYSICIAN: Mariea Clonts, MD  CHIEF COMPLAINT:   Chief Complaint  Patient presents with  . Shortness of Breath    HISTORY OF PRESENT ILLNESS:  Henry [name]  is a 70 y.o. male who presents with Several days cough and shortness of breath. Patient states that he has been on Levaquin for the past 3 days after diagnosis of community acquired pneumonia. However, despite this his symptoms have not improved. Here in the ED today he is mildly hypoxic. White blood cell count is elevated, however he was on steroids as well. Hospitalists were called for admission for treatment of community acquired pneumonia after having failed outpatient antibiotics.  PAST MEDICAL HISTORY:   Past Medical History:  Diagnosis Date  . Arthritis   . Cancer ([place])    skin  . Chronic kidney disease   . Diabetes mellitus without complication ([place])   . Glaucoma   . Hyperlipidemia   . Hypertension     PAST SURGICAL HISTORY:   Past Surgical History:  Procedure Laterality Date  . COLONOSCOPY WITH PROPOFOL N/A 12/08/2014   Procedure: COLONOSCOPY WITH PROPOFOL;  Surgeon: Manya Silvas, MD;  Location: [facility] ENDOSCOPY;  Service: Endoscopy;  Laterality: N/A;  . COLONOSCOPY WITH PROPOFOL N/A 01/10/2016   Procedure: COLONOSCOPY WITH PROPOFOL;  Surgeon: Manya Silvas, MD;  Location: [facility] ENDOSCOPY;  Service: Endoscopy;  Laterality: N/A;  . EXTRACORPOREAL SHOCK WAVE LITHOTRIPSY Right 11/09/2015   Procedure: EXTRACORPOREAL SHOCK WAVE LITHOTRIPSY (ESWL);  Surgeon: Royston Cowper, MD;  Location: [facility] ORS;  Service: Urology;  Laterality: Right;  . LITHOTRIPSY     x 6  . PAROTIDECTOMY  2015  . TONSILLECTOMY      SOCIAL HISTORY:   Social History  Substance Use Topics  . Smoking  status: Former Research scientist (life sciences)  . Smokeless tobacco: Never Used  . Alcohol use No    FAMILY HISTORY:   Family History  Problem Relation Age of Onset  . Heart attack Mother   . Melanoma Father   . Parkinsonism Father     DRUG ALLERGIES:  No Known Allergies  MEDICATIONS AT HOME:   Prior to Admission medications   Medication Sig Start Date End Date Taking? Authorizing Provider  aspirin EC 81 MG tablet Take 81 mg by mouth daily.    Historical Provider, MD  cetirizine (ZYRTEC) 10 MG tablet Take 10 mg by mouth daily.    Historical Provider, MD  fluticasone (FLONASE) 50 MCG/ACT nasal spray Place 1 spray into both nostrils daily.    Historical Provider, MD  HYDROcodone-acetaminophen (NORCO) 10-325 MG tablet Take 1-2 tablets by mouth every 4 (four) hours as needed for moderate pain. Maximum dose per 24 hours -8 pills. 11/09/15   Royston Cowper, MD  insulin detemir (LEVEMIR) 100 UNIT/ML injection Inject 100 Units into the skin 2 (two) times daily.     Historical Provider, MD  latanoprost (XALATAN) 0.005 % ophthalmic solution Place 1 drop into both eyes at bedtime.    Historical Provider, MD  meloxicam (MOBIC) 15 MG tablet Take 15 mg by mouth daily.    Historical Provider, MD  multivitamin-iron-minerals-folic acid (CENTRUM) chewable tablet Chew 1 tablet by mouth daily.    Historical Provider, MD  simvastatin (ZOCOR) 40 MG tablet Take 40 mg by  mouth daily.    Historical Provider, MD  sitaGLIPtin-metformin (JANUMET) 50-1000 MG per tablet Take 1 tablet by mouth 2 (two) times daily with a meal.    Historical Provider, MD  valsartan (DIOVAN) 320 MG tablet Take 320 mg by mouth daily.    Historical Provider, MD    REVIEW OF SYSTEMS:  Review of Systems  Constitutional: Positive for malaise/fatigue. Negative for chills, fever and weight loss.  HENT: Negative for ear pain, hearing loss and tinnitus.   Eyes: Negative for blurred vision, double vision, pain and redness.  Respiratory: Positive for cough, sputum  production and shortness of breath. Negative for hemoptysis.   Cardiovascular: Negative for chest pain, palpitations, orthopnea and leg swelling.  Gastrointestinal: Negative for abdominal pain, constipation, diarrhea, nausea and vomiting.  Genitourinary: Negative for dysuria, frequency and hematuria.  Musculoskeletal: Negative for back pain, joint pain and neck pain.  Skin:       No acne, rash, or lesions  Neurological: Negative for dizziness, tremors, focal weakness and weakness.  Endo/Heme/Allergies: Negative for polydipsia. Does not bruise/bleed easily.  Psychiatric/Behavioral: Negative for depression. The patient is not nervous/anxious and does not have insomnia.      VITAL SIGNS:   Vitals:   01/25/16 1716 01/25/16 2102  BP: (!) 132/54 (!) 123/92  Pulse: (!) 103 94  Resp: 18 (!) 21  Temp: 97.9 F (36.6 C)   TempSrc: Oral   SpO2: 90% 95%  Weight: 99.8 kg (220 lb)   Height: 5\' 11"  (1.803 m)    Wt Readings from Last 3 Encounters:  01/25/16 99.8 kg (220 lb)  01/10/16 99.8 kg (220 lb)  11/09/15 99.8 kg (220 lb)    PHYSICAL EXAMINATION:  Physical Exam  Vitals reviewed. Constitutional: He is oriented to person, place, and time. He appears well-developed and well-nourished. No distress.  HENT:  Head: Normocephalic and atraumatic.  Mouth/Throat: Oropharynx is clear and moist.  Eyes: Conjunctivae and EOM are normal. Pupils are equal, round, and reactive to light. No scleral icterus.  Neck: Normal range of motion. Neck supple. No JVD present. No thyromegaly present.  Cardiovascular: Normal rate, regular rhythm and intact distal pulses.  Exam reveals no gallop and no friction rub.   No murmur heard. Respiratory: Effort normal. No respiratory distress. He has wheezes. He has rales.  GI: Soft. Bowel sounds are normal. He exhibits no distension. There is no tenderness.  Musculoskeletal: Normal range of motion. He exhibits no edema.  No arthritis, no gout  Lymphadenopathy:    He  has no cervical adenopathy.  Neurological: He is alert and oriented to person, place, and time. No cranial nerve deficit.  No dysarthria, no aphasia  Skin: Skin is warm and dry. No rash noted. No erythema.  Psychiatric: He has a normal mood and affect. His behavior is normal. Judgment and thought content normal.    LABORATORY PANEL:   CBC  Recent Labs Lab 01/25/16 1720  WBC 17.5*  HGB 16.6  HCT 49.9  PLT 392   ------------------------------------------------------------------------------------------------------------------  Chemistries   Recent Labs Lab 01/25/16 1720  NA 135  K 4.4  CL 97*  CO2 27  GLUCOSE 97  BUN 22*  CREATININE 1.12  CALCIUM 10.4*   ------------------------------------------------------------------------------------------------------------------  Cardiac Enzymes No results for input(s): TROPONINI in the last 168 hours. ------------------------------------------------------------------------------------------------------------------  RADIOLOGY:  Dg Chest 2 View  Result Date: 01/25/2016 CLINICAL DATA:  Short of breath, former smoker EXAM: CHEST  2 VIEW COMPARISON:  None. FINDINGS: Normal mediastinum and cardiac silhouette. Hyperinflated  lungs. Normal pulmonary vasculature. No evidence of effusion, infiltrate, or pneumothorax. No acute bony abnormality. IMPRESSION: No acute cardiopulmonary process.  Hyperinflated lungs. Electronically Signed   By: Suzy Bouchard M.D.   On: 01/25/2016 17:41   Ct Angio Chest Pe W And/or Wo Contrast  Result Date: 01/25/2016 CLINICAL DATA:  Acute onset of shortness of breath. Leukocytosis. Initial encounter. EXAM: CT ANGIOGRAPHY CHEST WITH CONTRAST TECHNIQUE: Multidetector CT imaging of the chest was performed using the standard protocol during bolus administration of intravenous contrast. Multiplanar CT image reconstructions and MIPs were obtained to evaluate the vascular anatomy. CONTRAST:  75 mL of Isovue 370 IV  contrast COMPARISON:  Chest radiograph performed earlier today at 5:29 p.m. FINDINGS: Cardiovascular:  There is no evidence of pulmonary embolus. Diffuse coronary artery calcifications are seen. The heart remains normal in size. The thoracic aorta is grossly unremarkable, aside from scattered calcification along the aortic arch. The proximal great vessels are grossly unremarkable. Mediastinum/Nodes: No pericardial effusion is identified. No mediastinal lymphadenopathy is seen. The visualized portions of the thyroid gland are unremarkable. No axillary lymphadenopathy is seen. Lungs/Pleura: Patchy bibasilar airspace opacities may reflect atypical infection. Underlying peribronchial thickening is seen. No pleural effusion or pneumothorax is seen. No masses are identified. Upper Abdomen: The visualized portions of the liver and spleen are grossly unremarkable. Musculoskeletal: No acute osseous abnormalities are identified. The visualized musculature is unremarkable in appearance. Review of the MIP images confirms the above findings. IMPRESSION: 1. No evidence of pulmonary embolus. 2. Patchy bibasilar airspace opacities may reflect atypical infection, with underlying peribronchial thickening. Would correlate for any evidence of aspiration. 3. Diffuse coronary artery calcifications seen. Electronically Signed   By: Garald Balding M.D.   On: 01/25/2016 20:50    EKG:   Orders placed or performed during the hospital encounter of 01/25/16  . ED EKG 12-Lead  . ED EKG 12-Lead  . EKG 12-Lead  . EKG 12-Lead    IMPRESSION AND PLAN:  Principal Problem:   CAP (community acquired pneumonia) - IV antibiotics ordered as well as sputum culture. When necessary nebs Active Problems:   Diabetes ([place]) - sliding scale insulin with corresponding glucose checks and carb modified diet   HTN (hypertension) - currently stable, continue home meds   HLD (hyperlipidemia) - home dose statin  All the records are reviewed and case  discussed with ED provider. Management plans discussed with the patient and/or family.  DVT PROPHYLAXIS: SubQ lovenox  GI PROPHYLAXIS: None  ADMISSION STATUS: Inpatient  CODE STATUS: Full Code Status History    This patient does not have a recorded code status. Please follow your organizational policy for patients in this situation.    Advance Directive Documentation   Flowsheet Row Most Recent Value  Type of Advance Directive  Healthcare Power of Attorney, Living will  Pre-existing out of facility DNR order (yellow form or pink MOST form)  No data  "MOST" Form in Place?  No data      TOTAL TIME TAKING CARE OF THIS PATIENT: 45 minutes.    Stephani Janak Lawrence 01/25/2016, 10:00 PM  [employer] [facility]  Office  [phone]  CC: Primary care physician; Juluis Pitch, MD

## 2016-01-25 NOTE — Progress Notes (Signed)
Pharmacy Antibiotic Note  Henry [name] is a 70 y.o. male admitted on 01/25/2016 with pneumonia.  Pharmacy has been consulted for ceftriaxone and azithromycin dosing.  Plan: Ceftriaxone 1 g IV q24h Azithromycin 500 mg IV q24h  Height: 5\' 11"  (180.3 cm) Weight: 220 lb (99.8 kg) IBW/kg (Calculated) : 75.3  Temp (24hrs), Avg:97.9 F (36.6 C), Min:97.9 F (36.6 C), Max:97.9 F (36.6 C)   Recent Labs Lab 01/25/16 1720  WBC 17.5*  CREATININE 1.12    Estimated Creatinine Clearance: 73.9 mL/min (by C-G formula based on SCr of 1.12 mg/dL).    No Known Allergies  Antimicrobials this admission: azithromycin 10/19 >>  ceftrfiaxone 10/19 >>   Dose adjustments this admission:  Microbiology results: 10/19 BCx: Sent 10/19 UCx: Sent   Thank you for allowing pharmacy to be a part of this patient's care.  Lenis Noon, PharmD Clinical Pharmacist 01/25/2016 8:20 PM

## 2016-01-25 NOTE — ED Triage Notes (Signed)
Pt sent from PCP, pt has been on Po levaquin and steroids since last week and was told today his WBC went from 12 to 16, pt states continued SOB, pt awake and alert in no acute distress

## 2016-01-25 NOTE — ED Provider Notes (Signed)
[facility] Emergency Department Provider Note  ____________________________________________  Time seen: Approximately 7:11 PM  I have reviewed the triage vital signs and the nursing notes.   HISTORY  Chief Complaint Shortness of Breath    HPI Henry [name] is a 70 y.o. male with a history of HTN, HL, DM, remote smoking history, presenting for shortness of breath, hypoglycemia. The patient reports that last week he began to have a cough productive of green sputum with associated shortness of breath and was placed on a course of prednisone by his primary care nurse practitioner. After 6 days, the patient had not improved and was started on Levaquin. He returned today after 3 days on Levaquin, feeling slightly better, but was sent to the emergency department for increasing white blood cell count. Overall, the patient still reports an ongoing cough with dyspnea on exertion. He also reports that he has been having episodes of hypoglycemia with blood sugars in the 60s, without any change in his chronic insulin or by mouth intake. He denies any chest pain, leg swelling, or calf pain. No fevers or chills.  On arrival, the patient is to With an oxygen saturation of 90-91% on room air, stating his baseline is >95%.   Past Medical History:  Diagnosis Date  . Arthritis   . Cancer ([place])    skin  . Chronic kidney disease   . Diabetes mellitus without complication ([place])   . Glaucoma   . Hyperlipidemia   . Hypertension     There are no active problems to display for this patient.   Past Surgical History:  Procedure Laterality Date  . COLONOSCOPY WITH PROPOFOL N/A 12/08/2014   Procedure: COLONOSCOPY WITH PROPOFOL;  Surgeon: Manya Silvas, MD;  Location: [facility] ENDOSCOPY;  Service: Endoscopy;  Laterality: N/A;  . COLONOSCOPY WITH PROPOFOL N/A 01/10/2016   Procedure: COLONOSCOPY WITH PROPOFOL;  Surgeon: Manya Silvas, MD;  Location: [facility] ENDOSCOPY;  Service:  Endoscopy;  Laterality: N/A;  . EXTRACORPOREAL SHOCK WAVE LITHOTRIPSY Right 11/09/2015   Procedure: EXTRACORPOREAL SHOCK WAVE LITHOTRIPSY (ESWL);  Surgeon: Royston Cowper, MD;  Location: [facility] ORS;  Service: Urology;  Laterality: Right;  . LITHOTRIPSY     x 6  . PAROTIDECTOMY  2015  . TONSILLECTOMY      Current Outpatient Rx  . Order #: [id number] Class: Historical Med  . Order #: [id number] Class: Historical Med  . Order #: [id number] Class: Historical Med  . Order #: [id number] Class: Print  . Order #: [id number] Class: Historical Med  . Order #: [id number] Class: Historical Med  . Order #: [id number] Class: Historical Med  . Order #: [id number] Class: Historical Med  . Order #: [id number] Class: Historical Med  . Order #: [id number] Class: Historical Med  . Order #: [id number] Class: Historical Med    Allergies Review of patient's allergies indicates no known allergies.  History reviewed. No pertinent family history.  Social History Social History  Substance Use Topics  . Smoking status: Former Research scientist (life sciences)  . Smokeless tobacco: Never Used  . Alcohol use No    Review of Systems Constitutional: No fever/chills.No lightheadedness or syncope. Eyes: No visual changes. No eye discharge. ENT: No sore throat. No congestion or rhinorrhea. Cardiovascular: Denies chest pain. Denies palpitations. Respiratory: Positive shortness of breath.  Positive productive cough. Gastrointestinal: No abdominal pain.  No nausea, no vomiting.  No diarrhea.  No constipation. Genitourinary: Negative for dysuria. Musculoskeletal: Negative for back pain. No lower extremity swelling or calf pain. Skin: Negative for rash. Neurological: Negative for headaches. No focal  numbness, tingling or weakness.    10-point ROS otherwise negative.  ____________________________________________   PHYSICAL EXAM:  VITAL SIGNS: ED Triage Vitals  Enc Vitals Group     BP 01/25/16 1716 (!) 132/54     Pulse Rate 01/25/16 1716 (!) 103      Resp 01/25/16 1716 18     Temp 01/25/16 1716 97.9 F (36.6 C)     Temp Source 01/25/16 1716 Oral     SpO2 01/25/16 1716 90 %     Weight 01/25/16 1716 220 lb (99.8 kg)     Height 01/25/16 1716 5\' 11"  (1.803 m)     Head Circumference --      Peak Flow --      Pain Score 01/25/16 1717 0     Pain Loc --      Pain Edu? --      Excl. in [place]? --     Constitutional: Alert and oriented. Chronically ill appearing and mildly tachypnea but nontoxic Answers questions appropriately. Eyes: Conjunctivae are normal.  EOMI. No scleral icterus. No eye discharge for Head: Atraumatic. Nose: No congestion/rhinnorhea. Mouth/Throat: Mucous membranes are moist.  Neck: No stridor.  Supple.  No JVD. No meningismus. Cardiovascular: Fast rate in the low 100s, regular rhythm. No murmurs, rubs or gallops.  Respiratory: Tachypnea with mild accessory muscle use but no retractions. The patient has no obvious wheezes, rales, or rhonchi but does have poor air exchange bilaterally. His oxygen saturation is 90-91% on room air. Gastrointestinal: Obese. Soft, nontender and nondistended.  No guarding or rebound.  No peritoneal signs. Musculoskeletal: No LE edema. No ttp in the calves or palpable cords.  Negative Homan's sign. Neurologic:  A&Ox3.  Speech is clear.  Face and smile are symmetric.  EOMI.  Moves all extremities well. Skin:  Skin is warm, dry and intact. No rash noted. Psychiatric: Mood and affect are normal. Speech and behavior are normal.  Normal judgement.  ____________________________________________   LABS (all labs ordered are listed, but only abnormal results are displayed)  Labs Reviewed  BASIC METABOLIC PANEL - Abnormal; Notable for the following:       Result Value   Chloride 97 (*)    BUN 22 (*)    Calcium 10.4 (*)    All other components within normal limits  CBC - Abnormal; Notable for the following:    WBC 17.5 (*)    All other components within normal limits  BLOOD GAS, VENOUS -  Abnormal; Notable for the following:    Bicarbonate 30.4 (*)    Acid-Base Excess 4.5 (*)    All other components within normal limits  CULTURE, BLOOD (ROUTINE X 2)  CULTURE, BLOOD (ROUTINE X 2)  URINE CULTURE  GLUCOSE, CAPILLARY  LACTIC ACID, PLASMA  URINALYSIS COMPLETEWITH MICROSCOPIC ([facility] ONLY)  BRAIN NATRIURETIC PEPTIDE  TROPONIN I   ____________________________________________  EKG  ED ECG REPORT I, Eula Listen, the attending physician, personally viewed and interpreted this ECG.   Date: 01/25/2016  EKG Time: 1933  Rate: 94  Rhythm: normal sinus rhythm with PAC  Axis: normal  Intervals:none  ST&T Change: Nonspecific twave inversion in V1, no STEMI.  ____________________________________________  RADIOLOGY  Dg Chest 2 View  Result Date: 01/25/2016 CLINICAL DATA:  Short of breath, former smoker EXAM: CHEST  2 VIEW COMPARISON:  None. FINDINGS: Normal mediastinum and cardiac silhouette. Hyperinflated lungs. Normal pulmonary vasculature. No evidence of effusion, infiltrate, or pneumothorax. No acute bony abnormality. IMPRESSION: No acute cardiopulmonary process.  Hyperinflated lungs.  Electronically Signed   By: Suzy Bouchard M.D.   On: 01/25/2016 17:41   Ct Angio Chest Pe W And/or Wo Contrast  Result Date: 01/25/2016 CLINICAL DATA:  Acute onset of shortness of breath. Leukocytosis. Initial encounter. EXAM: CT ANGIOGRAPHY CHEST WITH CONTRAST TECHNIQUE: Multidetector CT imaging of the chest was performed using the standard protocol during bolus administration of intravenous contrast. Multiplanar CT image reconstructions and MIPs were obtained to evaluate the vascular anatomy. CONTRAST:  75 mL of Isovue 370 IV contrast COMPARISON:  Chest radiograph performed earlier today at 5:29 p.m. FINDINGS: Cardiovascular:  There is no evidence of pulmonary embolus. Diffuse coronary artery calcifications are seen. The heart remains normal in size. The thoracic aorta is grossly  unremarkable, aside from scattered calcification along the aortic arch. The proximal great vessels are grossly unremarkable. Mediastinum/Nodes: No pericardial effusion is identified. No mediastinal lymphadenopathy is seen. The visualized portions of the thyroid gland are unremarkable. No axillary lymphadenopathy is seen. Lungs/Pleura: Patchy bibasilar airspace opacities may reflect atypical infection. Underlying peribronchial thickening is seen. No pleural effusion or pneumothorax is seen. No masses are identified. Upper Abdomen: The visualized portions of the liver and spleen are grossly unremarkable. Musculoskeletal: No acute osseous abnormalities are identified. The visualized musculature is unremarkable in appearance. Review of the MIP images confirms the above findings. IMPRESSION: 1. No evidence of pulmonary embolus. 2. Patchy bibasilar airspace opacities may reflect atypical infection, with underlying peribronchial thickening. Would correlate for any evidence of aspiration. 3. Diffuse coronary artery calcifications seen. Electronically Signed   By: Garald Balding M.D.   On: 01/25/2016 20:50    ____________________________________________   PROCEDURES  Procedure(s) performed: None  Procedures  Critical Care performed: No ____________________________________________   INITIAL IMPRESSION / ASSESSMENT AND PLAN / ED COURSE  Pertinent labs & imaging results that were available during my care of the patient were reviewed by me and considered in my medical decision making (see chart for details).  70 y.o. male with a remote smoking history, HTN, HL, DM, presenting with shortness of breath and ongoing symptoms despite treatment with prednisone or Levaquin. The patient does feel slightly better after 3 days of Levaquin, and his elevation in white blood cell count may be from his prednisone treatment. However, he does have low oxygenation with tachypnea here. We'll get an ambulatory pulse ox for  further evaluation. His chest x-ray from triage does not show focal infiltrate, so it is important to consider other possible causes of shortness of breath including PE, or cardiac causes including congestive heart failure, pulmonary hypertension.  ----------------------------------------- 9:20 PM on 01/25/2016 -----------------------------------------  The patient's gas is reassuring and he was able to maintain saturations greater than 90% with walking. His CT scan does not show a PE, but it does show patchy infiltrates that are consistent with atypical infection. I will cover him with azithromycin and ceftriaxone, and continue DuoNeb times per he has received Solu-Medrol. I'll plan to admit him to the hospital for further evaluation and treatment. ____________________________________________  FINAL CLINICAL IMPRESSION(S) / ED DIAGNOSES  Final diagnoses:  Atypical pneumonia    Clinical Course      NEW MEDICATIONS STARTED DURING THIS VISIT:  New Prescriptions   No medications on file      Eula Listen, MD 01/25/16 2121

## 2016-01-26 LAB — BASIC METABOLIC PANEL
Anion gap: 9 (ref 5–15)
BUN: 24 mg/dL — ABNORMAL HIGH (ref 6–20)
CALCIUM: 9.6 mg/dL (ref 8.9–10.3)
CO2: 26 mmol/L (ref 22–32)
CREATININE: 1.21 mg/dL (ref 0.61–1.24)
Chloride: 98 mmol/L — ABNORMAL LOW (ref 101–111)
GFR, EST NON AFRICAN AMERICAN: 59 mL/min — AB (ref >60)
Glucose, Bld: 255 mg/dL — ABNORMAL HIGH (ref 65–99)
Potassium: 4.6 mmol/L (ref 3.5–5.1)
SODIUM: 133 mmol/L — AB (ref 135–145)

## 2016-01-26 LAB — CBC
HCT: 47.8 % (ref 40.0–52.0)
Hemoglobin: 15.5 g/dL (ref 13.0–18.0)
MCH: 29.3 pg (ref 26.0–34.0)
MCHC: 32.5 g/dL (ref 32.0–36.0)
MCV: 90.3 fL (ref 80.0–100.0)
PLATELETS: 368 10*3/uL (ref 150–440)
RBC: 5.29 MIL/uL (ref 4.40–5.90)
RDW: 14.4 % (ref 11.5–14.5)
WBC: 18.4 10*3/uL — ABNORMAL HIGH (ref 3.8–10.6)

## 2016-01-26 LAB — GLUCOSE, CAPILLARY
GLUCOSE-CAPILLARY: 249 mg/dL — AB (ref 65–99)
Glucose-Capillary: 262 mg/dL — ABNORMAL HIGH (ref 65–99)
Glucose-Capillary: 223 mg/dL — ABNORMAL HIGH (ref 65–99)
Glucose-Capillary: 249 mg/dL — ABNORMAL HIGH (ref 65–99)

## 2016-01-26 LAB — TROPONIN I: TROPONIN I: 0.08 ng/mL — AB (ref <0.03)

## 2016-01-26 LAB — PATHOLOGIST SMEAR REVIEW

## 2016-01-26 LAB — BRAIN NATRIURETIC PEPTIDE: B NATRIURETIC PEPTIDE 5: 15 pg/mL (ref 0.0–100.0)

## 2016-01-26 MED ORDER — CEFTRIAXONE SODIUM-DEXTROSE 1-3.74 GM-% IV SOLR
1.0000 g | INTRAVENOUS | Status: DC
  Administered 2016-01-26: 1 g via INTRAVENOUS
  Filled 2016-01-26 (×2): qty 50

## 2016-01-26 MED ORDER — GUAIFENESIN-DM 100-10 MG/5ML PO SYRP
10.0000 mL | ORAL_SOLUTION | Freq: Four times a day (QID) | ORAL | Status: DC | PRN
Start: 2016-01-26 — End: 2016-01-27
  Administered 2016-01-26 – 2016-01-27 (×2): 10 mL via ORAL
  Filled 2016-01-26 (×2): qty 10

## 2016-01-26 MED ORDER — DEXTROSE 5 % IV SOLN
500.0000 mg | INTRAVENOUS | Status: DC
  Administered 2016-01-26: 500 mg via INTRAVENOUS
  Filled 2016-01-26 (×2): qty 500

## 2016-01-26 MED ORDER — DEXTROSE 5 % IV SOLN: 1.0000 g | INTRAVENOUS | Status: DC

## 2016-01-26 MED ORDER — INSULIN DETEMIR 100 UNIT/ML ~~LOC~~ SOLN
40.0000 [IU] | Freq: Two times a day (BID) | SUBCUTANEOUS | Status: DC
  Administered 2016-01-26 (×2): 40 [IU] via SUBCUTANEOUS
  Filled 2016-01-26 (×4): qty 0.4

## 2016-01-26 NOTE — Progress Notes (Signed)
Inpatient Diabetes Program Recommendations  AACE/ADA: New Consensus Statement on Inpatient Glycemic Control (2015)  Target Ranges:  Prepandial:   less than 140 mg/dL      Peak postprandial:   less than 180 mg/dL (1-2 hours)      Critically ill patients:  140 - 180 mg/dL   Lab Results  Component Value Date   GLUCAP 271 (H) 01/25/2016    Review of Glycemic Control  Results for [name], Henry ALDER (MRN [id number]) as of 01/26/2016 08:17  Ref. Range 01/25/2016 18:13 01/25/2016 23:58 01/26/2016 07:30  Glucose-Capillary Latest Ref Range: 65 - 99 mg/dL 79 271 (H) 249 (H)   Diabetes history: Type 2 Outpatient Diabetes medications: Levemir 80 units bid, Janumet 50/1000mg  bid Current orders for Inpatient glycemic control: Novolog 0-9 units tid, Novolog 0-5 units qhs  Inpatient Diabetes Program Recommendations:  Please consider adding Levemir 40 units bid starting this am.  Per ADA recommendations "consider performing an A1C on all patients with diabetes or hyperglycemia admitted to the hospital if not performed in the prior 3 months".  Gentry Fitz, RN, BA, MHA, CDE Diabetes Coordinator Inpatient Glycemic Control Team [phone] (Team Pager) [phone] ([place]) 01/26/2016 8:16 AM

## 2016-01-26 NOTE — Progress Notes (Signed)
Patient troponin level 0.08. Md notified. No orders received.

## 2016-01-26 NOTE — Care Management Note (Signed)
Case Management Note  Patient Details  Name: Henry [name] MRN: [id number] Date of Birth: 04/17/1945  Subjective/Objective:                   Met with patient to discuss discharge planning. O2 is acute. He DOES NOT CURRENTLY HAVE A DIAGNOSIS TO COVER HOME O2. O2 assessment requested from RN. Patient is from home where he states he is independent with daily activities/drives. His PCP is Dr. Bronstein.  Action/Plan:  RNCM will continue to follow. Jason with [facility] asked to monitor for home O2 need (patient will have to pay privately for) Encouraged insentive spirometer use- patient agreed.   Expected Discharge Date:                  Expected Discharge Plan:     In-House Referral:     Discharge planning Services  CM Consult  Post Acute Care Choice:  Durable Medical Equipment Choice offered to:  Patient  DME Arranged:  Oxygen DME Agency:  [facility]  HH Arranged:    HH Agency:     Status of Service:  In process, will continue to follow  If discussed at Long Length of Stay Meetings, dates discussed:    Additional Comments:   , RN 01/26/2016, 10:46 AM  

## 2016-01-26 NOTE — Progress Notes (Signed)
SATURATION QUALIFICATIONS: (This note is used to comply with regulatory documentation for home oxygen)  Patient Saturations on Room Air at Rest = 92%  Patient Saturations on Room Air while Ambulating = 86%  Patient Saturations on 2 Liters of oxygen while Ambulating = 95%  Please briefly explain why patient needs home oxygen: 

## 2016-01-26 NOTE — Progress Notes (Signed)
[place] [facility] [place] NAME: Henry [name]    MR#:  [id number]  DATE OF BIRTH:  July 02, 1945  SUBJECTIVE:  CHIEF COMPLAINT:  Patient is feeling better. Cough is slightly better denies any shortness of breath while resting  REVIEW OF SYSTEMS:  CONSTITUTIONAL: No fever, fatigue or weakness.  EYES: No blurred or double vision.  EARS, NOSE, AND THROAT: No tinnitus or ear pain.  RESPIRATORY:  Reporting cough, shortness of breath,  denieswheezing or hemoptysis.  CARDIOVASCULAR: No chest pain, orthopnea, edema.  GASTROINTESTINAL: No nausea, vomiting, diarrhea or abdominal pain.  GENITOURINARY: No dysuria, hematuria.  ENDOCRINE: No polyuria, nocturia,  HEMATOLOGY: No anemia, easy bruising or bleeding SKIN: No rash or lesion. MUSCULOSKELETAL: No joint pain or arthritis.   NEUROLOGIC: No tingling, numbness, weakness.  PSYCHIATRY: No anxiety or depression.   DRUG ALLERGIES:  No Known Allergies  VITALS:  Blood pressure (!) 129/94, pulse 74, temperature 98 F (36.7 C), temperature source Oral, resp. rate 18, height 5\' 11"  (1.803 m), weight 96.4 kg (212 lb 8.7 oz), SpO2 92 %.  PHYSICAL EXAMINATION:  GENERAL:  70 y.o.-year-old patient lying in the bed with no acute distress.  EYES: Pupils equal, round, reactive to light and accommodation. No scleral icterus. Extraocular muscles intact.  HEENT: Head atraumatic, normocephalic. Oropharynx and nasopharynx clear.  NECK:  Supple, no jugular venous distention. No thyroid enlargement, no tenderness.  LUNGS:Moderate  breath sounds bilaterally, no wheezing, rales,rhonchi or crepitation. No use of accessory muscles of respiration.  CARDIOVASCULAR: S1, S2 normal. No murmurs, rubs, or gallops.  ABDOMEN: Soft, nontender, nondistended. Bowel sounds present. No organomegaly or mass.  EXTREMITIES: No pedal edema, cyanosis, or clubbing.  NEUROLOGIC: Cranial nerves II through XII are intact. Muscle strength 5/5  in all extremities. Sensation intact. Gait not checked.  PSYCHIATRIC: The patient is alert and oriented x 3.  SKIN: No obvious rash, lesion, or ulcer.    LABORATORY PANEL:   CBC  Recent Labs Lab 01/26/16 0005  WBC 18.4*  HGB 15.5  HCT 47.8  PLT 368   ------------------------------------------------------------------------------------------------------------------  Chemistries   Recent Labs Lab 01/26/16 0005  NA 133*  K 4.6  CL 98*  CO2 26  GLUCOSE 255*  BUN 24*  CREATININE 1.21  CALCIUM 9.6   ------------------------------------------------------------------------------------------------------------------  Cardiac Enzymes  Recent Labs Lab 01/26/16 0005  TROPONINI 0.08*   ------------------------------------------------------------------------------------------------------------------  RADIOLOGY:  Dg Chest 2 View  Result Date: 01/25/2016 CLINICAL DATA:  Short of breath, former smoker EXAM: CHEST  2 VIEW COMPARISON:  None. FINDINGS: Normal mediastinum and cardiac silhouette. Hyperinflated lungs. Normal pulmonary vasculature. No evidence of effusion, infiltrate, or pneumothorax. No acute bony abnormality. IMPRESSION: No acute cardiopulmonary process.  Hyperinflated lungs. Electronically Signed   By: Suzy Bouchard M.D.   On: 01/25/2016 17:41   Ct Angio Chest Pe W And/or Wo Contrast  Result Date: 01/25/2016 CLINICAL DATA:  Acute onset of shortness of breath. Leukocytosis. Initial encounter. EXAM: CT ANGIOGRAPHY CHEST WITH CONTRAST TECHNIQUE: Multidetector CT imaging of the chest was performed using the standard protocol during bolus administration of intravenous contrast. Multiplanar CT image reconstructions and MIPs were obtained to evaluate the vascular anatomy. CONTRAST:  75 mL of Isovue 370 IV contrast COMPARISON:  Chest radiograph performed earlier today at 5:29 p.m. FINDINGS: Cardiovascular:  There is no evidence of pulmonary embolus. Diffuse coronary artery  calcifications are seen. The heart remains normal in size. The thoracic aorta is grossly unremarkable, aside from scattered calcification along the  aortic arch. The proximal great vessels are grossly unremarkable. Mediastinum/Nodes: No pericardial effusion is identified. No mediastinal lymphadenopathy is seen. The visualized portions of the thyroid gland are unremarkable. No axillary lymphadenopathy is seen. Lungs/Pleura: Patchy bibasilar airspace opacities may reflect atypical infection. Underlying peribronchial thickening is seen. No pleural effusion or pneumothorax is seen. No masses are identified. Upper Abdomen: The visualized portions of the liver and spleen are grossly unremarkable. Musculoskeletal: No acute osseous abnormalities are identified. The visualized musculature is unremarkable in appearance. Review of the MIP images confirms the above findings. IMPRESSION: 1. No evidence of pulmonary embolus. 2. Patchy bibasilar airspace opacities may reflect atypical infection, with underlying peribronchial thickening. Would correlate for any evidence of aspiration. 3. Diffuse coronary artery calcifications seen. Electronically Signed   By: Garald Balding M.D.   On: 01/25/2016 20:50    EKG:   Orders placed or performed during the hospital encounter of 01/25/16  . ED EKG 12-Lead  . ED EKG 12-Lead  . EKG 12-Lead  . EKG 12-Lead    ASSESSMENT AND PLAN:   #CAP (community acquired pneumonia) -  Clinically improving  continue IV Rocephin and azithromycin follow up on the sputum cultures  check a.m. labs     #acute hypoxic respiratory failure secondary to pneumonia  Provide oxygen and recheck in a.m.    # Diabetes ([place]) - sliding scale insulin with corresponding glucose checks and carb modified diet   # HTN (hypertension) - currently stable, continue home meds Avapro    HLD (hyperlipidemia) - home dose statin     All the records are reviewed and case discussed with Care Management/Social  Workerr. Management plans discussed with the patient, family and they are in agreement.  CODE STATUS: fc  TOTAL TIME TAKING CARE OF THIS PATIENT: 36  minutes.   POSSIBLE D/C IN 1-2 DAYS, DEPENDING ON CLINICAL CONDITION.  Note: This dictation was prepared with Dragon dictation along with smaller phrase technology. Any transcriptional errors that result from this process are unintentional.   Nicholes Mango M.D on 01/26/2016 at 4:07 PM  Between 7am to 6pm - Pager - [phone] After 6pm go to [email or web address] - password [email or web address] [facility]  Office  [phone]  CC: Primary care physician; Juluis Pitch, MD

## 2016-01-26 NOTE — Progress Notes (Signed)
Patient complaining of cough. MD notified. Orders received.

## 2016-01-26 NOTE — Progress Notes (Signed)
Pharmacy Antibiotic Note  Henry [name] is a 70 y.o. male admitted on 01/25/2016 with pneumonia.  Pharmacy has been consulted for ceftriaxone and azithromycin dosing. Patient on Levaquin po x 3 days PTA.  Plan: Ceftriaxone 1 g IV q24h Azithromycin 500 mg IV q24h  Height: 5\' 11"  (180.3 cm) Weight: 212 lb 8.7 oz (96.4 kg) IBW/kg (Calculated) : 75.3  Temp (24hrs), Avg:97.7 F (36.5 C), Min:97.5 F (36.4 C), Max:97.9 F (36.6 C)   Recent Labs Lab 01/25/16 1720 01/25/16 1903 01/26/16 0005  WBC 17.5*  --  18.4*  CREATININE 1.12  --  1.21  LATICACIDVEN  --  1.9  --     Estimated Creatinine Clearance: 67.3 mL/min (by C-G formula based on SCr of 1.21 mg/dL).    No Known Allergies  Antimicrobials this admission: azithromycin 10/19 >>  ceftrfiaxone 10/19 >>   Dose adjustments this admission:  Microbiology results: 10/19 BCx: Sent 10/19 UCx: Sent   Thank you for allowing pharmacy to be a part of this patient's care.  Noralee Space, PharmD Clinical Pharmacist 01/26/2016 9:59 AM

## 2016-01-26 NOTE — Progress Notes (Signed)
Nutrition Brief Note  Patient identified on the Malnutrition Screening Tool (MST) Report  Wt Readings from Last 15 Encounters:  01/26/16 212 lb 8.7 oz (96.4 kg)  01/10/16 220 lb (99.8 kg)  11/09/15 220 lb (99.8 kg)  12/08/14 220 lb (99.8 kg)   Henry [name]  is a 70 y.o. male who presents with Several days cough and shortness of breath. Patient states that he has been on Levaquin for the past 3 days after diagnosis of community acquired pneumonia. However, despite this his symptoms have not improved. Here in the ED today he is mildly hypoxic. White blood cell count is elevated, however he was on steroids as well. Hospitalists were called for admission for treatment of community acquired pneumonia after having failed outpatient antibiotics.  Pt admitted with CAP.   Spoke with pt at bedside, who reports poor appetite and weight loss secondary opt pneumonia. Pt reports he has been forcing himself to eat, even though he does not get pleasure from eating due to taste changes ("everything tastes like rubber"). Pt confirms he ate all of his breakfast (french toast this morning).   Pt endorses weight loss over there past 2 weeks, secondary to acute illness. Per wt hx, pt has experienced a 3.4% wt loss over the past 2-3 weeks, which is not significant for time frame.   Pt also complains that he has been eating more secondary to lower blood sugar readings, which he attributes to prednisone use. Typically, pt has made efforts to obtain better glycemic control ("my PCP is a stickler for DM"). He confirms home regimen of Levemir 80 units BID and Janumet 50/1000mg  BID. He has been increasing exercise ny walking [place] and playing basketball. He shares that last HgbA1c was 7.1; new Hgb A1c pending per DM coordinator note.   Nutrition-Focused physical exam completed. Findings are no fat depletion, no muscle depletion, and no edema.   Pt denies further nutritional needs at this time, but  expressed appreciation for visit.    Body mass index is 29.64 kg/m. Patient meets criteria for overweight based on current BMI.   Current diet order is Heart Healthy/ Carb Modified, patient is consuming approximately 100% of meals at this time. Labs and medications reviewed.   No nutrition interventions warranted at this time. If nutrition issues arise, please consult RD.   Henry [name] A. Jimmye Norman, RD, LDN, CDE Pager: [phone] After hours Pager: [phone]

## 2016-01-27 DIAGNOSIS — Z23 Encounter for immunization: Secondary | ICD-10-CM | POA: Diagnosis not present

## 2016-01-27 LAB — URINE CULTURE: CULTURE: NO GROWTH

## 2016-01-27 LAB — GLUCOSE, CAPILLARY
GLUCOSE-CAPILLARY: 116 mg/dL — AB (ref 65–99)
Glucose-Capillary: 143 mg/dL — ABNORMAL HIGH (ref 65–99)

## 2016-01-27 LAB — HEMOGLOBIN A1C
Hgb A1c MFr Bld: 6.7 % — ABNORMAL HIGH (ref 4.8–5.6)
MEAN PLASMA GLUCOSE: 146 mg/dL

## 2016-01-27 MED ORDER — GUAIFENESIN-DM 100-10 MG/5ML PO SYRP: 10.0000 mL | ORAL_SOLUTION | Freq: Four times a day (QID) | ORAL | 0 refills | Status: DC | PRN

## 2016-01-27 MED ORDER — ACETAMINOPHEN 325 MG PO TABS: 650.0000 mg | ORAL_TABLET | Freq: Four times a day (QID) | ORAL | Status: DC | PRN

## 2016-01-27 MED ORDER — INSULIN DETEMIR 100 UNIT/ML ~~LOC~~ SOLN: 35.0000 [IU] | Freq: Every day | SUBCUTANEOUS | 11 refills | Status: DC

## 2016-01-27 MED ORDER — AMOXICILLIN-POT CLAVULANATE 500-125 MG PO TABS: 1.0000 | ORAL_TABLET | Freq: Three times a day (TID) | ORAL | 0 refills | Status: AC

## 2016-01-27 MED ORDER — ALBUTEROL SULFATE HFA 108 (90 BASE) MCG/ACT IN AERS: 2.0000 | INHALATION_SPRAY | Freq: Four times a day (QID) | RESPIRATORY_TRACT | 0 refills | Status: DC | PRN

## 2016-01-27 MED ORDER — INSULIN DETEMIR 100 UNIT/ML ~~LOC~~ SOLN
40.0000 [IU] | SUBCUTANEOUS | 11 refills | Status: DC
Start: 2016-01-27 — End: 2017-08-12

## 2016-01-27 NOTE — Discharge Instructions (Signed)
activity as tolerated Diabetic and low-salt diet Stop smoking Follow-up with primary care physician in a week  DIET:

## 2016-01-27 NOTE — Progress Notes (Signed)
Notified Dr Margaretmary Eddy of CBG of 68. Received order to hold am dose of levemir

## 2016-01-27 NOTE — Discharge Summary (Signed)
[place] [facility] [place] NAME: Henry [name]    MR#:  [id number]  DATE OF BIRTH:  [birth year]  DATE OF ADMISSION:  01/25/2016 ADMITTING PHYSICIAN: Lance Coon, MD  DATE OF DISCHARGE: 01/27/16 PRIMARY CARE PHYSICIAN: Juluis Pitch, MD    ADMISSION DIAGNOSIS:  Atypical pneumonia [J18.9]  DISCHARGE DIAGNOSIS:  Principal Problem:   CAP (community acquired pneumonia) Active Problems:   Diabetes ([place])   HTN (hypertension)   HLD (hyperlipidemia)   SECONDARY DIAGNOSIS:   Past Medical History:  Diagnosis Date  . Arthritis   . Cancer ([place])    skin  . Chronic kidney disease   . Diabetes mellitus without complication ([place])   . Glaucoma   . Hyperlipidemia   . Hypertension     HOSPITAL COURSE:   #CAP (community acquired pneumonia) -  Clinically improving with  IV Rocephin and azithromycin Discharge home with by mouth Augmentin Sputum sample never given     #acute hypoxic respiratory failure secondary to pneumonia  Improved. Patient ambulatory pulse ox on room air is 93%  # Diabetes ([place]) -  Patient takes Levemir 80 units twice a day .patient was given 40 units twice a day during the hospital course  will discharge him home with 40 units in a.m. and 35 units p.m.   carb modified diet Holding Janumet until seen by primary care physician  #HTN (hypertension) - currently stable, continue home meds Avapro  HLD (hyperlipidemia) - home dose statin  Elevated troponin secondary to demand ischemia.   patient is asymptomatic denies any chest pain DISCHARGE CONDITIONS:   fair  CONSULTS OBTAINED:     PROCEDURES none  DRUG ALLERGIES:  No Known Allergies  DISCHARGE MEDICATIONS:   Current Discharge Medication List    START taking these medications   Details  acetaminophen (TYLENOL) 325 MG tablet Take 2 tablets (650 mg total) by mouth every 6 (six) hours as needed for mild pain (or Fever >/= 101).     albuterol (PROVENTIL HFA;VENTOLIN HFA) 108 (90 Base) MCG/ACT inhaler Inhale 2 puffs into the lungs every 6 (six) hours as needed for wheezing or shortness of breath. Qty: 1 Inhaler, Refills: 0    amoxicillin-clavulanate (AUGMENTIN) 500-125 MG tablet Take 1 tablet (500 mg total) by mouth 3 (three) times daily. Qty: 20 tablet, Refills: 0    guaiFENesin-dextromethorphan (ROBITUSSIN DM) 100-10 MG/5ML syrup Take 10 mLs by mouth every 6 (six) hours as needed for cough. Qty: 118 mL, Refills: 0      CONTINUE these medications which have CHANGED   Details  !! insulin detemir (LEVEMIR) 100 UNIT/ML injection Inject 0.4 mLs (40 Units total) into the skin every morning. Qty: 10 mL, Refills: 11    !! insulin detemir (LEVEMIR) 100 UNIT/ML injection Inject 0.35 mLs (35 Units total) into the skin at bedtime. Qty: 10 mL, Refills: 11     !! - Potential duplicate medications found. Please discuss with provider.    CONTINUE these medications which have NOT CHANGED   Details  aspirin EC 81 MG tablet Take 81 mg by mouth daily.    fluticasone (FLONASE) 50 MCG/ACT nasal spray Place 1 spray into both nostrils daily.    HYDROcodone-acetaminophen (NORCO) 10-325 MG tablet Take 1-2 tablets by mouth every 4 (four) hours as needed for moderate pain. Maximum dose per 24 hours -8 pills. Qty: 30 tablet, Refills: 0    multivitamin-iron-minerals-folic acid (CENTRUM) chewable tablet Chew 1 tablet by mouth daily.    simvastatin (ZOCOR)  40 MG tablet Take 40 mg by mouth daily.    valsartan (DIOVAN) 320 MG tablet Take 320 mg by mouth daily.      STOP taking these medications     levofloxacin (LEVAQUIN) 500 MG tablet      sitaGLIPtin-metformin (JANUMET) 50-1000 MG per tablet      latanoprost (XALATAN) 0.005 % ophthalmic solution          DISCHARGE INSTRUCTIONS:   Activity as tolerated Diabetic and low-salt diet Stop smoking Follow-up with primary care physician in a week  DIET:  Diabetic  diet,Low-salt  DISCHARGE CONDITION:  Fair  ACTIVITY:  Activity as tolerated  OXYGEN:  Home Oxygen: No.   Oxygen Delivery: room air  DISCHARGE LOCATION:  home   If you experience worsening of your admission symptoms, develop shortness of breath, life threatening emergency, suicidal or homicidal thoughts you must seek medical attention immediately by calling 911 or calling your MD immediately  if symptoms less severe.  You Must read complete instructions/literature along with all the possible adverse reactions/side effects for all the Medicines you take and that have been prescribed to you. Take any new Medicines after you have completely understood and accpet all the possible adverse reactions/side effects.   Please note  You were cared for by a hospitalist during your hospital stay. If you have any questions about your discharge medications or the care you received while you were in the hospital after you are discharged, you can call the unit and asked to speak with the hospitalist on call if the hospitalist that took care of you is not available. Once you are discharged, your primary care physician will handle any further medical issues. Please note that NO REFILLS for any discharge medications will be authorized once you are discharged, as it is imperative that you return to your primary care physician (or establish a relationship with a primary care physician if you do not have one) for your aftercare needs so that they can reassess your need for medications and monitor your lab values.     Today  Chief Complaint  Patient presents with  . Shortness of Breath   Pt is doing fine. Denies any sob, weaned off to RA. Wants to go home  ROS:  CONSTITUTIONAL: Denies fevers, chills. Denies any fatigue, weakness.  EYES: Denies blurry vision, double vision, eye pain. EARS, NOSE, THROAT: Denies tinnitus, ear pain, hearing loss. RESPIRATORY: Denies cough, wheeze, shortness of breath.   CARDIOVASCULAR: Denies chest pain, palpitations, edema.  GASTROINTESTINAL: Denies nausea, vomiting, diarrhea, abdominal pain. Denies bright red blood per rectum. GENITOURINARY: Denies dysuria, hematuria. ENDOCRINE: Denies nocturia or thyroid problems. HEMATOLOGIC AND LYMPHATIC: Denies easy bruising or bleeding. SKIN: Denies rash or lesion. MUSCULOSKELETAL: Denies pain in neck, back, shoulder, knees, hips or arthritic symptoms.  NEUROLOGIC: Denies paralysis, paresthesias.  PSYCHIATRIC: Denies anxiety or depressive symptoms.   VITAL SIGNS:  Blood pressure 118/68, pulse 78, temperature 97.6 F (36.4 C), temperature source Oral, resp. rate 18, height 5\' 11"  (1.803 m), weight 94.9 kg (209 lb 4.8 oz), SpO2 93 %.  I/O:    Intake/Output Summary (Last 24 hours) at 01/27/16 1256 Last data filed at 01/27/16 0800  Gross per 24 hour  Intake              240 ml  Output                0 ml  Net  240 ml    PHYSICAL EXAMINATION:  GENERAL:  70 y.o.-year-old patient lying in the bed with no acute distress.  EYES: Pupils equal, round, reactive to light and accommodation. No scleral icterus. Extraocular muscles intact.  HEENT: Head atraumatic, normocephalic. Oropharynx and nasopharynx clear.  NECK:  Supple, no jugular venous distention. No thyroid enlargement, no tenderness.  LUNGS: Normal breath sounds bilaterally, no wheezing, rales,rhonchi or crepitation. No use of accessory muscles of respiration.  CARDIOVASCULAR: S1, S2 normal. No murmurs, rubs, or gallops.  ABDOMEN: Soft, non-tender, non-distended. Bowel sounds present. No organomegaly or mass.  EXTREMITIES: No pedal edema, cyanosis, or clubbing.  NEUROLOGIC: Cranial nerves II through XII are intact. Muscle strength 5/5 in all extremities. Sensation intact. Gait not checked.  PSYCHIATRIC: The patient is alert and oriented x 3.  SKIN: No obvious rash, lesion, or ulcer.   DATA REVIEW:   CBC  Recent Labs Lab 01/26/16 0005   WBC 18.4*  HGB 15.5  HCT 47.8  PLT 368    Chemistries   Recent Labs Lab 01/26/16 0005  NA 133*  K 4.6  CL 98*  CO2 26  GLUCOSE 255*  BUN 24*  CREATININE 1.21  CALCIUM 9.6    Cardiac Enzymes  Recent Labs Lab 01/26/16 0005  TROPONINI 0.08*    Microbiology Results  Results for orders placed or performed during the hospital encounter of 01/25/16  Blood culture (routine x 2)     Status: None (Preliminary result)   Collection Time: 01/25/16  7:03 PM  Result Value Ref Range Status   Specimen Description BLOOD RIGHT AC  Final   Special Requests BOTTLES DRAWN AEROBIC AND ANAEROBIC ANA5CC AER10CC  Final   Culture NO GROWTH 2 DAYS  Final   Report Status PENDING  Incomplete  Blood culture (routine x 2)     Status: None (Preliminary result)   Collection Time: 01/25/16  7:03 PM  Result Value Ref Range Status   Specimen Description BLOOD LEFT AC  Final   Special Requests BOTTLES DRAWN AEROBIC AND ANAEROBIC ANA5CC [place]  Final   Culture NO GROWTH 2 DAYS  Final   Report Status PENDING  Incomplete  Urine culture     Status: None   Collection Time: 01/25/16  7:03 PM  Result Value Ref Range Status   Specimen Description URINE, CLEAN CATCH  Final   Special Requests NONE  Final   Culture NO GROWTH Performed at [facility]   Final   Report Status 01/27/2016 FINAL  Final    RADIOLOGY:  Dg Chest 2 View  Result Date: 01/25/2016 CLINICAL DATA:  Short of breath, former smoker EXAM: CHEST  2 VIEW COMPARISON:  None. FINDINGS: Normal mediastinum and cardiac silhouette. Hyperinflated lungs. Normal pulmonary vasculature. No evidence of effusion, infiltrate, or pneumothorax. No acute bony abnormality. IMPRESSION: No acute cardiopulmonary process.  Hyperinflated lungs. Electronically Signed   By: Suzy Bouchard M.D.   On: 01/25/2016 17:41   Ct Angio Chest Pe W And/or Wo Contrast  Result Date: 01/25/2016 CLINICAL DATA:  Acute onset of shortness of breath. Leukocytosis.  Initial encounter. EXAM: CT ANGIOGRAPHY CHEST WITH CONTRAST TECHNIQUE: Multidetector CT imaging of the chest was performed using the standard protocol during bolus administration of intravenous contrast. Multiplanar CT image reconstructions and MIPs were obtained to evaluate the vascular anatomy. CONTRAST:  75 mL of Isovue 370 IV contrast COMPARISON:  Chest radiograph performed earlier today at 5:29 p.m. FINDINGS: Cardiovascular:  There is no evidence of pulmonary embolus. Diffuse coronary artery  calcifications are seen. The heart remains normal in size. The thoracic aorta is grossly unremarkable, aside from scattered calcification along the aortic arch. The proximal great vessels are grossly unremarkable. Mediastinum/Nodes: No pericardial effusion is identified. No mediastinal lymphadenopathy is seen. The visualized portions of the thyroid gland are unremarkable. No axillary lymphadenopathy is seen. Lungs/Pleura: Patchy bibasilar airspace opacities may reflect atypical infection. Underlying peribronchial thickening is seen. No pleural effusion or pneumothorax is seen. No masses are identified. Upper Abdomen: The visualized portions of the liver and spleen are grossly unremarkable. Musculoskeletal: No acute osseous abnormalities are identified. The visualized musculature is unremarkable in appearance. Review of the MIP images confirms the above findings. IMPRESSION: 1. No evidence of pulmonary embolus. 2. Patchy bibasilar airspace opacities may reflect atypical infection, with underlying peribronchial thickening. Would correlate for any evidence of aspiration. 3. Diffuse coronary artery calcifications seen. Electronically Signed   By: Garald Balding M.D.   On: 01/25/2016 20:50    EKG:   Orders placed or performed during the hospital encounter of 01/25/16  . ED EKG 12-Lead  . ED EKG 12-Lead  . EKG 12-Lead  . EKG 12-Lead      Management plans discussed with the patient, family and they are in  agreement.  CODE STATUS:     Code Status Orders        Start     Ordered   01/25/16 2345  Full code  Continuous     01/25/16 2344    Code Status History    Date Active Date Inactive Code Status Order ID Comments User Context   This patient has a current code status but no historical code status.    Advance Directive Documentation   Flowsheet Row Most Recent Value  Type of Advance Directive  Healthcare Power of Attorney, Living will  Pre-existing out of facility DNR order (yellow form or pink MOST form)  No data  "MOST" Form in Place?  No data      TOTAL TIME TAKING CARE OF THIS PATIENT: 45 minutes.   Note: This dictation was prepared with Dragon dictation along with smaller phrase technology. Any transcriptional errors that result from this process are unintentional.   @[facility] @  on 01/27/2016 at 12:56 PM  Between 7am to 6pm - Pager - [phone]  After 6pm go to [email or web address] - password [email or web address] [facility]  Office  [phone]  CC: Primary care physician; Juluis Pitch, MD

## 2016-01-28 LAB — BLOOD GAS, VENOUS
Acid-Base Excess: 4.5 mmol/L — ABNORMAL HIGH (ref 0.0–2.0)
Bicarbonate: 30.4 mmol/L — ABNORMAL HIGH (ref 20.0–28.0)
PATIENT TEMPERATURE: 37
PCO2 VEN: 49 mmHg (ref 44.0–60.0)
PH VEN: 7.4 (ref 7.250–7.430)

## 2016-01-28 LAB — HEMOGLOBIN A1C
HEMOGLOBIN A1C: 6.9 % — AB (ref 4.8–5.6)
Mean Plasma Glucose: 151 mg/dL

## 2016-01-29 DIAGNOSIS — E1129 Type 2 diabetes mellitus with other diabetic kidney complication: Secondary | ICD-10-CM | POA: Diagnosis not present

## 2016-01-29 DIAGNOSIS — Z794 Long term (current) use of insulin: Secondary | ICD-10-CM | POA: Diagnosis not present

## 2016-01-29 DIAGNOSIS — Z09 Encounter for follow-up examination after completed treatment for conditions other than malignant neoplasm: Secondary | ICD-10-CM | POA: Diagnosis not present

## 2016-01-30 LAB — CULTURE, BLOOD (ROUTINE X 2)
CULTURE: NO GROWTH
CULTURE: NO GROWTH

## 2016-02-08 DIAGNOSIS — R899 Unspecified abnormal finding in specimens from other organs, systems and tissues: Secondary | ICD-10-CM | POA: Diagnosis not present

## 2016-02-22 DIAGNOSIS — Z8701 Personal history of pneumonia (recurrent): Secondary | ICD-10-CM | POA: Diagnosis not present

## 2016-02-22 DIAGNOSIS — J189 Pneumonia, unspecified organism: Secondary | ICD-10-CM | POA: Diagnosis not present

## 2016-02-26 DIAGNOSIS — H40003 Preglaucoma, unspecified, bilateral: Secondary | ICD-10-CM | POA: Diagnosis not present

## 2016-03-12 DIAGNOSIS — H40003 Preglaucoma, unspecified, bilateral: Secondary | ICD-10-CM | POA: Diagnosis not present

## 2016-05-06 DIAGNOSIS — E1129 Type 2 diabetes mellitus with other diabetic kidney complication: Secondary | ICD-10-CM | POA: Diagnosis not present

## 2016-05-06 DIAGNOSIS — I10 Essential (primary) hypertension: Secondary | ICD-10-CM | POA: Diagnosis not present

## 2016-05-06 DIAGNOSIS — Z8739 Personal history of other diseases of the musculoskeletal system and connective tissue: Secondary | ICD-10-CM | POA: Diagnosis not present

## 2016-05-06 DIAGNOSIS — Z794 Long term (current) use of insulin: Secondary | ICD-10-CM | POA: Diagnosis not present

## 2016-05-06 DIAGNOSIS — E785 Hyperlipidemia, unspecified: Secondary | ICD-10-CM | POA: Diagnosis not present

## 2016-05-08 DIAGNOSIS — E1129 Type 2 diabetes mellitus with other diabetic kidney complication: Secondary | ICD-10-CM | POA: Diagnosis not present

## 2016-05-08 DIAGNOSIS — Z794 Long term (current) use of insulin: Secondary | ICD-10-CM | POA: Diagnosis not present

## 2016-05-08 DIAGNOSIS — I10 Essential (primary) hypertension: Secondary | ICD-10-CM | POA: Diagnosis not present

## 2016-05-08 DIAGNOSIS — E785 Hyperlipidemia, unspecified: Secondary | ICD-10-CM | POA: Diagnosis not present

## 2016-06-03 DIAGNOSIS — M7752 Other enthesopathy of left foot: Secondary | ICD-10-CM | POA: Diagnosis not present

## 2016-06-03 DIAGNOSIS — M7751 Other enthesopathy of right foot: Secondary | ICD-10-CM | POA: Diagnosis not present

## 2016-06-25 DIAGNOSIS — N201 Calculus of ureter: Secondary | ICD-10-CM | POA: Diagnosis not present

## 2016-06-25 DIAGNOSIS — N200 Calculus of kidney: Secondary | ICD-10-CM | POA: Diagnosis not present

## 2016-06-25 DIAGNOSIS — N23 Unspecified renal colic: Secondary | ICD-10-CM | POA: Diagnosis not present

## 2016-07-02 DIAGNOSIS — N200 Calculus of kidney: Secondary | ICD-10-CM | POA: Diagnosis not present

## 2016-07-10 DIAGNOSIS — N201 Calculus of ureter: Secondary | ICD-10-CM | POA: Diagnosis not present

## 2016-08-12 ENCOUNTER — Emergency Department
Admission: EM | Admit: 2016-08-12 | Discharge: 2016-08-12 | Disposition: A | Discharge status: Home / Self Care | Payer: Medicare HMO | Attending: Emergency Medicine | Admitting: Emergency Medicine

## 2016-08-12 ENCOUNTER — Emergency Department: Payer: Medicare HMO

## 2016-08-12 ENCOUNTER — Encounter: Payer: Self-pay | Admitting: Emergency Medicine

## 2016-08-12 DIAGNOSIS — R0789 Other chest pain: Secondary | ICD-10-CM | POA: Diagnosis not present

## 2016-08-12 DIAGNOSIS — F17210 Nicotine dependence, cigarettes, uncomplicated: Secondary | ICD-10-CM | POA: Diagnosis not present

## 2016-08-12 DIAGNOSIS — N189 Chronic kidney disease, unspecified: Secondary | ICD-10-CM | POA: Insufficient documentation

## 2016-08-12 DIAGNOSIS — Z79899 Other long term (current) drug therapy: Secondary | ICD-10-CM | POA: Insufficient documentation

## 2016-08-12 DIAGNOSIS — R079 Chest pain, unspecified: Secondary | ICD-10-CM | POA: Insufficient documentation

## 2016-08-12 DIAGNOSIS — Z794 Long term (current) use of insulin: Secondary | ICD-10-CM | POA: Insufficient documentation

## 2016-08-12 DIAGNOSIS — I129 Hypertensive chronic kidney disease with stage 1 through stage 4 chronic kidney disease, or unspecified chronic kidney disease: Secondary | ICD-10-CM | POA: Insufficient documentation

## 2016-08-12 DIAGNOSIS — E1122 Type 2 diabetes mellitus with diabetic chronic kidney disease: Secondary | ICD-10-CM | POA: Diagnosis not present

## 2016-08-12 LAB — BASIC METABOLIC PANEL
Anion gap: 9 (ref 5–15)
BUN: 23 mg/dL — AB (ref 6–20)
CO2: 28 mmol/L (ref 22–32)
Calcium: 10.2 mg/dL (ref 8.9–10.3)
Chloride: 99 mmol/L — ABNORMAL LOW (ref 101–111)
Creatinine, Ser: 1.18 mg/dL (ref 0.61–1.24)
GFR calc Af Amer: >60 mL/min (ref >60)
GFR calc non Af Amer: >60 mL/min (ref >60)
GLUCOSE: 153 mg/dL — AB (ref 65–99)
POTASSIUM: 3.8 mmol/L (ref 3.5–5.1)
SODIUM: 136 mmol/L (ref 135–145)

## 2016-08-12 LAB — CBC
HEMATOCRIT: 46.6 % (ref 40.0–52.0)
Hemoglobin: 15.9 g/dL (ref 13.0–18.0)
MCH: 30.4 pg (ref 26.0–34.0)
MCHC: 34 g/dL (ref 32.0–36.0)
MCV: 89.4 fL (ref 80.0–100.0)
Platelets: 270 10*3/uL (ref 150–440)
RBC: 5.22 MIL/uL (ref 4.40–5.90)
RDW: 13.7 % (ref 11.5–14.5)
WBC: 13.1 10*3/uL — AB (ref 3.8–10.6)

## 2016-08-12 LAB — TROPONIN I

## 2016-08-12 LAB — GLUCOSE, CAPILLARY: GLUCOSE-CAPILLARY: 86 mg/dL (ref 65–99)

## 2016-08-12 NOTE — ED Triage Notes (Signed)
States began had chest pain this am about 7 am which lasted about 30 minutes. Returned about 4pm, now resolving.

## 2016-08-12 NOTE — ED Provider Notes (Signed)
[facility] Emergency Department Provider Note    First MD Initiated Contact with Patient 08/12/16 2157     (approximate)  I have reviewed the triage vital signs and the nursing notes.   HISTORY  Chief Complaint Chest Pain    HPI Henry [name] is a 71 y.o. male with below list of chronic medical conditions presents to the emergency department with history of central chest pressure with onset at 7 AM this morning lasting approximately 15 minutes then resolution. Patient states the pain recurred at 4 PM this afternoon lasting again approximately 15 minutes with spontaneous resolution. Of note the patient states during the second episode of chest discomfort he checked his blood sugar and it was 68 he admits to feeling tremulous and diaphoretic, anxious symptoms CONSISTENT with previous episodes of hypoglycemia. She denies any chest pain at present. Patient denies any shortness of breath no long summary pain swelling. Patient denies any history of DVT or PE.   Past Medical History:  Diagnosis Date  . Arthritis   . Cancer ([place])    skin  . Chronic kidney disease   . Diabetes mellitus without complication ([place])   . Glaucoma   . Hyperlipidemia   . Hypertension     Patient Active Problem List   Diagnosis Date Noted  . CAP (community acquired pneumonia) 01/25/2016  . Diabetes ([place]) 01/25/2016  . HTN (hypertension) 01/25/2016  . HLD (hyperlipidemia) 01/25/2016    Past Surgical History:  Procedure Laterality Date  . COLONOSCOPY WITH PROPOFOL N/A 12/08/2014   Procedure: COLONOSCOPY WITH PROPOFOL;  Surgeon: Manya Silvas, MD;  Location: [facility] ENDOSCOPY;  Service: Endoscopy;  Laterality: N/A;  . COLONOSCOPY WITH PROPOFOL N/A 01/10/2016   Procedure: COLONOSCOPY WITH PROPOFOL;  Surgeon: Manya Silvas, MD;  Location: [facility] ENDOSCOPY;  Service: Endoscopy;  Laterality: N/A;  . EXTRACORPOREAL SHOCK WAVE LITHOTRIPSY Right 11/09/2015   Procedure: EXTRACORPOREAL  SHOCK WAVE LITHOTRIPSY (ESWL);  Surgeon: Royston Cowper, MD;  Location: [facility] ORS;  Service: Urology;  Laterality: Right;  . LITHOTRIPSY     x 6  . PAROTIDECTOMY  2015  . TONSILLECTOMY      Prior to Admission medications   Medication Sig Start Date End Date Taking? Authorizing Provider  acetaminophen (TYLENOL) 325 MG tablet Take 2 tablets (650 mg total) by mouth every 6 (six) hours as needed for mild pain (or Fever >/= 101). 01/27/16   Gouru, Illene Silver, MD  albuterol (PROVENTIL HFA;VENTOLIN HFA) 108 (90 Base) MCG/ACT inhaler Inhale 2 puffs into the lungs every 6 (six) hours as needed for wheezing or shortness of breath. 01/27/16   Nicholes Mango, MD  aspirin EC 81 MG tablet Take 81 mg by mouth daily.    [provider]  fluticasone (FLONASE) 50 MCG/ACT nasal spray Place 1 spray into both nostrils daily.    [provider]  guaiFENesin-dextromethorphan (ROBITUSSIN DM) 100-10 MG/5ML syrup Take 10 mLs by mouth every 6 (six) hours as needed for cough. 01/27/16   Nicholes Mango, MD  HYDROcodone-acetaminophen (NORCO) 10-325 MG tablet Take 1-2 tablets by mouth every 4 (four) hours as needed for moderate pain. Maximum dose per 24 hours -8 pills. 11/09/15   Royston Cowper, MD  insulin detemir (LEVEMIR) 100 UNIT/ML injection Inject 0.4 mLs (40 Units total) into the skin every morning. 01/27/16   Gouru, Illene Silver, MD  insulin detemir (LEVEMIR) 100 UNIT/ML injection Inject 0.35 mLs (35 Units total) into the skin at bedtime. 01/27/16   Nicholes Mango, MD  multivitamin-iron-minerals-folic acid (CENTRUM) chewable tablet Chew 1 tablet by mouth daily.    [provider]  simvastatin (ZOCOR) 40 MG tablet Take 40 mg by mouth daily.    [provider]  valsartan (DIOVAN) 320 MG tablet Take 320 mg by mouth daily.    [provider]    Allergies No known drug allergies  Family History  Problem Relation Age of Onset  . Heart attack Mother   . Melanoma Father   . Parkinsonism  Father     Social History Social History  Substance Use Topics  . Smoking status: Current Every Day Smoker    Packs/day: 0.10    Types: Cigarettes  . Smokeless tobacco: Never Used  . Alcohol use No    Review of Systems Constitutional: No fever/chills Eyes: No visual changes. ENT: No sore throat. Cardiovascular: Positive for chest pain. Respiratory: Denies shortness of breath. Gastrointestinal: No abdominal pain.  No nausea, no vomiting.  No diarrhea.  No constipation. Genitourinary: Negative for dysuria. Musculoskeletal: Negative for back pain. Integumentary: Negative for rash. Neurological: Negative for headaches, focal weakness or numbness.   ____________________________________________   PHYSICAL EXAM:  VITAL SIGNS: ED Triage Vitals  Enc Vitals Group     BP 08/12/16 1728 (!) 143/58     Pulse Rate 08/12/16 1728 91     Resp 08/12/16 1728 18     Temp 08/12/16 1728 98 F (36.7 C)     Temp Source 08/12/16 1728 Oral     SpO2 08/12/16 1728 95 %     Weight 08/12/16 1729 210 lb (95.3 kg)     Height 08/12/16 1729 5\' 11"  (1.803 m)     Head Circumference --      Peak Flow --      Pain Score 08/12/16 1728 2     Pain Loc --      Pain Edu? --      Excl. in [place]? --     Constitutional: Alert and oriented. Well appearing and in no acute distress. Eyes: Conjunctivae are normal. PERRL. EOMI. Head: Atraumatic. Mouth/Throat: Mucous membranes are moist.  Oropharynx non-erythematous. Neck: No stridor.   Cardiovascular: Normal rate, regular rhythm. Good peripheral circulation. Grossly normal heart sounds. Respiratory: Normal respiratory effort.  No retractions. Lungs CTAB. Gastrointestinal: Soft and nontender. No distention.  Musculoskeletal: No lower extremity tenderness nor edema. No gross deformities of extremities. Neurologic:  Normal speech and language. No gross focal neurologic deficits are appreciated.  Skin:  Skin is warm, dry and intact. No rash noted. Psychiatric:  Mood and affect are normal. Speech and behavior are normal.  ____________________________________________   LABS (all labs ordered are listed, but only abnormal results are displayed)  Labs Reviewed  BASIC METABOLIC PANEL - Abnormal; Notable for the following:       Result Value   Chloride 99 (*)    Glucose, Bld 153 (*)    BUN 23 (*)    All other components within normal limits  CBC - Abnormal; Notable for the following:    WBC 13.1 (*)    All other components within normal limits  TROPONIN I  TROPONIN I  GLUCOSE, CAPILLARY   ____________________________________________  EKG  ED ECG REPORT I, [place] Shameika Speelman, the attending physician, personally viewed and interpreted this ECG.   Date: 08/12/2016  EKG Time: 5:24 PM  Rate: 92  Rhythm: Normal sinus rhythm  Axis: Normal  Intervals: Normal  ST&T Change: None  ____________________________________________  RADIOLOGY I, [place] Ernst Bowler, personally  viewed and evaluated these images (plain radiographs) as part of my medical decision making, as well as reviewing the written report by the radiologist.  Dg Chest 2 View  Result Date: 08/12/2016 CLINICAL DATA:  Chest pain EXAM: CHEST  2 VIEW COMPARISON:  None. FINDINGS: The heart size and mediastinal contours are within normal limits. There is bibasilar atelectasis. The visualized skeletal structures are unremarkable. IMPRESSION: Bibasilar atelectasis. Electronically Signed   By: Ulyses Jarred M.D.   On: 08/12/2016 18:02     Procedures   ____________________________________________   INITIAL IMPRESSION / ASSESSMENT AND PLAN / ED COURSE  Pertinent labs & imaging results that were available during my care of the patient were reviewed by me and considered in my medical decision making (see chart for details).  71 year old male presenting with 2 episodes of chest pain lasting approximately 15 minutes to spontaneous resolution. Troponin negative x 2. Concern for possible angina  and a such patient referred to Dr. Tomasita Crumble shows for outpatient stress test. Patient is advised to return to emergency department immediately if pain were to recur or any shortness of breath.       ____________________________________________  FINAL CLINICAL IMPRESSION(S) / ED DIAGNOSES  Final diagnoses:  Nonspecific chest pain     MEDICATIONS GIVEN DURING THIS VISIT:  Medications - No data to display   NEW OUTPATIENT MEDICATIONS STARTED DURING THIS VISIT:  New Prescriptions   No medications on file    Modified Medications   No medications on file    Discontinued Medications   No medications on file     Note:  This document was prepared using Dragon voice recognition software and may include unintentional dictation errors.    Gregor Hams, MD 08/12/16 2249

## 2016-08-14 DIAGNOSIS — E785 Hyperlipidemia, unspecified: Secondary | ICD-10-CM | POA: Diagnosis not present

## 2016-08-14 DIAGNOSIS — I10 Essential (primary) hypertension: Secondary | ICD-10-CM | POA: Diagnosis not present

## 2016-08-14 DIAGNOSIS — R079 Chest pain, unspecified: Secondary | ICD-10-CM | POA: Diagnosis not present

## 2016-08-14 DIAGNOSIS — I491 Atrial premature depolarization: Secondary | ICD-10-CM | POA: Diagnosis not present

## 2016-08-16 DIAGNOSIS — R079 Chest pain, unspecified: Secondary | ICD-10-CM | POA: Diagnosis not present

## 2016-08-22 ENCOUNTER — Ambulatory Visit: Admit: 2016-08-22 | Payer: Commercial Managed Care - HMO | Admitting: Urology

## 2016-08-22 SURGERY — LITHOTRIPSY, ESWL
Anesthesia: Moderate Sedation | Laterality: Left

## 2016-08-26 DIAGNOSIS — R002 Palpitations: Secondary | ICD-10-CM | POA: Diagnosis not present

## 2016-08-26 DIAGNOSIS — R079 Chest pain, unspecified: Secondary | ICD-10-CM | POA: Diagnosis not present

## 2016-08-26 DIAGNOSIS — E785 Hyperlipidemia, unspecified: Secondary | ICD-10-CM | POA: Diagnosis not present

## 2016-08-26 DIAGNOSIS — I10 Essential (primary) hypertension: Secondary | ICD-10-CM | POA: Diagnosis not present

## 2016-08-26 DIAGNOSIS — I491 Atrial premature depolarization: Secondary | ICD-10-CM | POA: Diagnosis not present

## 2016-09-10 DIAGNOSIS — H40003 Preglaucoma, unspecified, bilateral: Secondary | ICD-10-CM | POA: Diagnosis not present

## 2016-10-15 DIAGNOSIS — N200 Calculus of kidney: Secondary | ICD-10-CM | POA: Diagnosis not present

## 2016-10-16 MED ORDER — LEVOFLOXACIN 500 MG PO TABS
500.0000 mg | ORAL_TABLET | ORAL | Status: AC
  Administered 2016-10-17: 500 mg via ORAL

## 2016-10-17 ENCOUNTER — Ambulatory Visit
Admission: RE | Admit: 2016-10-17 | Discharge: 2016-10-17 | Disposition: A | Discharge status: Home / Self Care | Payer: Medicare HMO | Source: Ambulatory Visit | Attending: Urology | Admitting: Urology

## 2016-10-17 ENCOUNTER — Encounter
Admission: RE | Disposition: A | Discharge status: Home / Self Care | Payer: Self-pay | Source: Ambulatory Visit | Attending: Urology

## 2016-10-17 DIAGNOSIS — E119 Type 2 diabetes mellitus without complications: Secondary | ICD-10-CM | POA: Insufficient documentation

## 2016-10-17 DIAGNOSIS — Z7982 Long term (current) use of aspirin: Secondary | ICD-10-CM | POA: Diagnosis not present

## 2016-10-17 DIAGNOSIS — Z79899 Other long term (current) drug therapy: Secondary | ICD-10-CM | POA: Insufficient documentation

## 2016-10-17 DIAGNOSIS — N200 Calculus of kidney: Secondary | ICD-10-CM | POA: Diagnosis not present

## 2016-10-17 HISTORY — PX: EXTRACORPOREAL SHOCK WAVE LITHOTRIPSY: SHX1557

## 2016-10-17 LAB — GLUCOSE, CAPILLARY: GLUCOSE-CAPILLARY: 170 mg/dL — AB (ref 65–99)

## 2016-10-17 SURGERY — LITHOTRIPSY, ESWL
Anesthesia: Moderate Sedation | Laterality: Left

## 2016-10-17 MED ORDER — DEXTROSE-NACL 5-0.45 % IV SOLN
INTRAVENOUS | Status: DC
  Administered 2016-10-17: 13:00:00 via INTRAVENOUS

## 2016-10-17 MED ORDER — LEVOFLOXACIN 500 MG PO TABS: 500.0000 mg | ORAL_TABLET | Freq: Every day | ORAL | 0 refills | Status: DC

## 2016-10-17 MED ORDER — MORPHINE SULFATE (PF) 10 MG/ML IV SOLN
INTRAVENOUS | Status: AC
  Administered 2016-10-17: 10 mg via INTRAMUSCULAR
  Filled 2016-10-17: qty 1

## 2016-10-17 MED ORDER — DIPHENHYDRAMINE HCL 25 MG PO CAPS
ORAL_CAPSULE | ORAL | Status: AC
  Administered 2016-10-17: 25 mg via ORAL
  Filled 2016-10-17: qty 1

## 2016-10-17 MED ORDER — PROMETHAZINE HCL 25 MG/ML IJ SOLN
25.0000 mg | Freq: Once | INTRAMUSCULAR | Status: AC
  Administered 2016-10-17: 25 mg via INTRAMUSCULAR

## 2016-10-17 MED ORDER — ONDANSETRON 8 MG PO TBDP: 8.0000 mg | ORAL_TABLET | Freq: Four times a day (QID) | ORAL | 3 refills | Status: DC | PRN

## 2016-10-17 MED ORDER — FUROSEMIDE 10 MG/ML IJ SOLN: 10.0000 mg | Freq: Once | INTRAMUSCULAR | Status: DC

## 2016-10-17 MED ORDER — MIDAZOLAM HCL 2 MG/2ML IJ SOLN
10.0000 mg | Freq: Once | INTRAMUSCULAR | Status: DC
  Filled 2016-10-17: qty 10

## 2016-10-17 MED ORDER — MORPHINE SULFATE (PF) 10 MG/ML IV SOLN
10.0000 mg | Freq: Once | INTRAVENOUS | Status: AC
  Administered 2016-10-17: 10 mg via INTRAMUSCULAR

## 2016-10-17 MED ORDER — HYDROCODONE-ACETAMINOPHEN 10-325 MG PO TABS: 1.0000 | ORAL_TABLET | ORAL | 0 refills | Status: DC | PRN

## 2016-10-17 MED ORDER — LEVOFLOXACIN 500 MG PO TABS
ORAL_TABLET | ORAL | Status: AC
  Administered 2016-10-17: 500 mg via ORAL
  Filled 2016-10-17: qty 1

## 2016-10-17 MED ORDER — FUROSEMIDE 10 MG/ML IJ SOLN
INTRAMUSCULAR | Status: AC
  Filled 2016-10-17: qty 2

## 2016-10-17 MED ORDER — DIPHENHYDRAMINE HCL 25 MG PO CAPS
25.0000 mg | ORAL_CAPSULE | ORAL | Status: AC
  Administered 2016-10-17: 25 mg via ORAL

## 2016-10-17 MED ORDER — MIDAZOLAM HCL 2 MG/2ML IJ SOLN
INTRAMUSCULAR | Status: AC
  Filled 2016-10-17: qty 2

## 2016-10-17 MED ORDER — PROMETHAZINE HCL 25 MG/ML IJ SOLN
INTRAMUSCULAR | Status: AC
  Administered 2016-10-17: 25 mg via INTRAMUSCULAR
  Filled 2016-10-17: qty 1

## 2016-10-17 MED ORDER — MIDAZOLAM HCL 2 MG/2ML IJ SOLN
1.0000 mg | Freq: Once | INTRAMUSCULAR | Status: AC
  Administered 2016-10-17: 1 mg via INTRAMUSCULAR

## 2016-10-17 NOTE — Discharge Instructions (Signed)
Lithotripsy, Care After °This sheet gives you information about how to care for yourself after your procedure. Your health care provider may also give you more specific instructions. If you have problems or questions, contact your health care provider. °What can I expect after the procedure? °After the procedure, it is common to have: °· Some blood in your urine. This should only last for a few days. °· Soreness in your back, sides, or upper abdomen for a few days. °· Blotches or bruises on your back where the pressure wave entered the skin. °· Pain, discomfort, or nausea when pieces (fragments) of the kidney stone move through the tube that carries urine from the kidney to the bladder (ureter). Stone fragments may pass soon after the procedure, but they may continue to pass for up to 4-8 weeks. °? If you have severe pain or nausea, contact your health care provider. This may be caused by a large stone that was not broken up, and this may mean that you need more treatment. °· Some pain or discomfort during urination. °· Some pain or discomfort in the lower abdomen or (in men) at the base of the penis. ° °Follow these instructions at home: °Medicines °· Take over-the-counter and prescription medicines only as told by your health care provider. °· If you were prescribed an antibiotic medicine, take it as told by your health care provider. Do not stop taking the antibiotic even if you start to feel better. °· Do not drive for 24 hours if you were given a medicine to help you relax (sedative). °· Do not drive or use heavy machinery while taking prescription pain medicine. °Eating and drinking °· Drink enough water and fluids to keep your urine clear or pale yellow. This helps any remaining pieces of the stone to pass. It can also help prevent new stones from forming. °· Eat plenty of fresh fruits and vegetables. °· Follow instructions from your health care provider about eating and drinking restrictions. You may be  instructed: °? To reduce how much salt (sodium) you eat or drink. Check ingredients and nutrition facts on packaged foods and beverages. °? To reduce how much meat you eat. °· Eat the recommended amount of calcium for your age and gender. Ask your health care provider how much calcium you should have. °General instructions °· Get plenty of rest. °· Most people can resume normal activities 1-2 days after the procedure. Ask your health care provider what activities are safe for you. °· If directed, strain all urine through the strainer that was provided by your health care provider. °? Keep all fragments for your health care provider to see. Any stones that are found may be sent to a medical lab for examination. The stone may be as small as a grain of salt. °· Keep all follow-up visits as told by your health care provider. This is important. °Contact a health care provider if: °· You have pain that is severe or does not get better with medicine. °· You have nausea that is severe or does not go away. °· You have blood in your urine longer than your health care provider told you to expect. °· You have more blood in your urine. °· You have pain during urination that does not go away. °· You urinate more frequently than usual and this does not go away. °· You develop a rash or any other possible signs of an allergic reaction. °Get help right away if: °· You have severe pain in   your back, sides, or upper abdomen.  You have severe pain while urinating.  Your urine is very dark red.  You have blood in your stool (feces).  You cannot pass any urine at all.  You feel a strong urge to urinate after emptying your bladder.  You have a fever or chills.  You develop shortness of breath, difficulty breathing, or chest pain.  You have severe nausea that leads to persistent vomiting.  You faint. Summary  After this procedure, it is common to have some pain, discomfort, or nausea when pieces (fragments) of the  kidney stone move through the tube that carries urine from the kidney to the bladder (ureter). If this pain or nausea is severe, however, you should contact your health care provider.  Most people can resume normal activities 1-2 days after the procedure. Ask your health care provider what activities are safe for you.  Drink enough water and fluids to keep your urine clear or pale yellow. This helps any remaining pieces of the stone to pass, and it can help prevent new stones from forming.  If directed, strain your urine and keep all fragments for your health care provider to see. Fragments or stones may be as small as a grain of salt.  Get help right away if you have severe pain in your back, sides, or upper abdomen or have severe pain while urinating. This information is not intended to replace advice given to you by your health care provider. Make sure you discuss any questions you have with your health care provider. Document Released: 04/14/2007 Document Revised: 02/14/2016 Document Reviewed: 02/14/2016 Elsevier Interactive Patient Education  2017 [employer].   Kidney Stones Kidney stones (urolithiasis) are rock-like masses that form inside of the kidneys. Kidneys are organs that make pee (urine). A kidney stone can cause very bad pain and can block the flow of pee. The stone usually leaves your body (passes) through your pee. You may need to have a doctor take out the stone. Follow these instructions at home: Eating and drinking  Drink enough fluid to keep your pee clear or pale yellow. This will help you pass the stone.  If told by your doctor, change the foods you eat (your diet). This may include: ? Limiting how much salt (sodium) you eat. ? Eating more fruits and vegetables. ? Limiting how much meat, poultry, fish, and eggs you eat.  Follow instructions from your doctor about eating or drinking restrictions. General instructions  Collect pee samples as told by your doctor.  You may need to collect a pee sample: ? 24 hours after a stone comes out. ? 8-12 weeks after a stone comes out, and every 6-12 months after that.  Strain your pee every time you pee (urinate), for as long as told. Use the strainer that your doctor recommends.  Do not throw out the stone. Keep it so that it can be tested by your doctor.  Take over-the-counter and prescription medicines only as told by your doctor.  Keep all follow-up visits as told by your doctor. This is important. You may need follow-up tests. Preventing kidney stones To prevent another kidney stone:  Drink enough fluid to keep your pee clear or pale yellow. This is the best way to prevent kidney stones.  Eat healthy foods.  Avoid certain foods as told by your doctor. You may be told to eat less protein.  Stay at a healthy weight.  Contact a doctor if:  You have pain that  gets worse or does not get better with medicine. Get help right away if:  You have a fever or chills.  You get very bad pain.  You get new pain in your belly (abdomen).  You pass out (faint).  You cannot pee. This information is not intended to replace advice given to you by your health care provider. Make sure you discuss any questions you have with your health care provider. Document Released: 09/11/2007 Document Revised: 12/12/2015 Document Reviewed: 12/12/2015 Elsevier Interactive Patient Education  2017 [employer].

## 2016-10-18 ENCOUNTER — Encounter: Payer: Self-pay | Admitting: Urology

## 2016-10-23 DIAGNOSIS — N200 Calculus of kidney: Secondary | ICD-10-CM | POA: Diagnosis not present

## 2016-11-26 DIAGNOSIS — N401 Enlarged prostate with lower urinary tract symptoms: Secondary | ICD-10-CM | POA: Diagnosis not present

## 2016-11-26 DIAGNOSIS — N200 Calculus of kidney: Secondary | ICD-10-CM | POA: Diagnosis not present

## 2016-11-28 DIAGNOSIS — R079 Chest pain, unspecified: Secondary | ICD-10-CM | POA: Diagnosis not present

## 2016-11-28 DIAGNOSIS — E1129 Type 2 diabetes mellitus with other diabetic kidney complication: Secondary | ICD-10-CM | POA: Diagnosis not present

## 2016-11-28 DIAGNOSIS — E785 Hyperlipidemia, unspecified: Secondary | ICD-10-CM | POA: Diagnosis not present

## 2016-11-28 DIAGNOSIS — I491 Atrial premature depolarization: Secondary | ICD-10-CM | POA: Diagnosis not present

## 2016-11-28 DIAGNOSIS — I10 Essential (primary) hypertension: Secondary | ICD-10-CM | POA: Diagnosis not present

## 2016-11-28 DIAGNOSIS — R002 Palpitations: Secondary | ICD-10-CM | POA: Diagnosis not present

## 2016-11-28 DIAGNOSIS — Z794 Long term (current) use of insulin: Secondary | ICD-10-CM | POA: Diagnosis not present

## 2016-12-16 DIAGNOSIS — M7751 Other enthesopathy of right foot: Secondary | ICD-10-CM | POA: Diagnosis not present

## 2016-12-16 DIAGNOSIS — M7752 Other enthesopathy of left foot: Secondary | ICD-10-CM | POA: Diagnosis not present

## 2016-12-23 DIAGNOSIS — L821 Other seborrheic keratosis: Secondary | ICD-10-CM | POA: Diagnosis not present

## 2016-12-23 DIAGNOSIS — D2261 Melanocytic nevi of right upper limb, including shoulder: Secondary | ICD-10-CM | POA: Diagnosis not present

## 2016-12-23 DIAGNOSIS — D485 Neoplasm of uncertain behavior of skin: Secondary | ICD-10-CM | POA: Diagnosis not present

## 2016-12-23 DIAGNOSIS — X32XXXA Exposure to sunlight, initial encounter: Secondary | ICD-10-CM | POA: Diagnosis not present

## 2016-12-23 DIAGNOSIS — Z85828 Personal history of other malignant neoplasm of skin: Secondary | ICD-10-CM | POA: Diagnosis not present

## 2016-12-23 DIAGNOSIS — L570 Actinic keratosis: Secondary | ICD-10-CM | POA: Diagnosis not present

## 2016-12-23 DIAGNOSIS — D225 Melanocytic nevi of trunk: Secondary | ICD-10-CM | POA: Diagnosis not present

## 2016-12-23 DIAGNOSIS — C44729 Squamous cell carcinoma of skin of left lower limb, including hip: Secondary | ICD-10-CM | POA: Diagnosis not present

## 2017-01-02 DIAGNOSIS — R05 Cough: Secondary | ICD-10-CM | POA: Diagnosis not present

## 2017-01-02 DIAGNOSIS — F17200 Nicotine dependence, unspecified, uncomplicated: Secondary | ICD-10-CM | POA: Diagnosis not present

## 2017-01-02 DIAGNOSIS — J309 Allergic rhinitis, unspecified: Secondary | ICD-10-CM | POA: Diagnosis not present

## 2017-01-02 DIAGNOSIS — E1129 Type 2 diabetes mellitus with other diabetic kidney complication: Secondary | ICD-10-CM | POA: Diagnosis not present

## 2017-01-02 DIAGNOSIS — Z794 Long term (current) use of insulin: Secondary | ICD-10-CM | POA: Diagnosis not present

## 2017-02-06 DIAGNOSIS — C44729 Squamous cell carcinoma of skin of left lower limb, including hip: Secondary | ICD-10-CM | POA: Diagnosis not present

## 2017-02-14 DIAGNOSIS — R1319 Other dysphagia: Secondary | ICD-10-CM | POA: Diagnosis not present

## 2017-03-04 DIAGNOSIS — H40003 Preglaucoma, unspecified, bilateral: Secondary | ICD-10-CM | POA: Diagnosis not present

## 2017-03-11 DIAGNOSIS — H40003 Preglaucoma, unspecified, bilateral: Secondary | ICD-10-CM | POA: Diagnosis not present

## 2017-03-25 DIAGNOSIS — M79672 Pain in left foot: Secondary | ICD-10-CM | POA: Diagnosis not present

## 2017-03-25 DIAGNOSIS — M79671 Pain in right foot: Secondary | ICD-10-CM | POA: Diagnosis not present

## 2017-03-25 DIAGNOSIS — M7752 Other enthesopathy of left foot: Secondary | ICD-10-CM | POA: Diagnosis not present

## 2017-03-25 DIAGNOSIS — M7751 Other enthesopathy of right foot: Secondary | ICD-10-CM | POA: Diagnosis not present

## 2017-05-22 IMAGING — CR DG CHEST 2V
1 series · 2 of 2 positions shown · non-contrast
Comparison: None.

CLINICAL DATA: Short of breath, former smoker

EXAM:
CHEST  2 VIEW

[Series 1: w chest pa · 0.14mm/px · 2 of 2 slices shown]
[im 1/2]
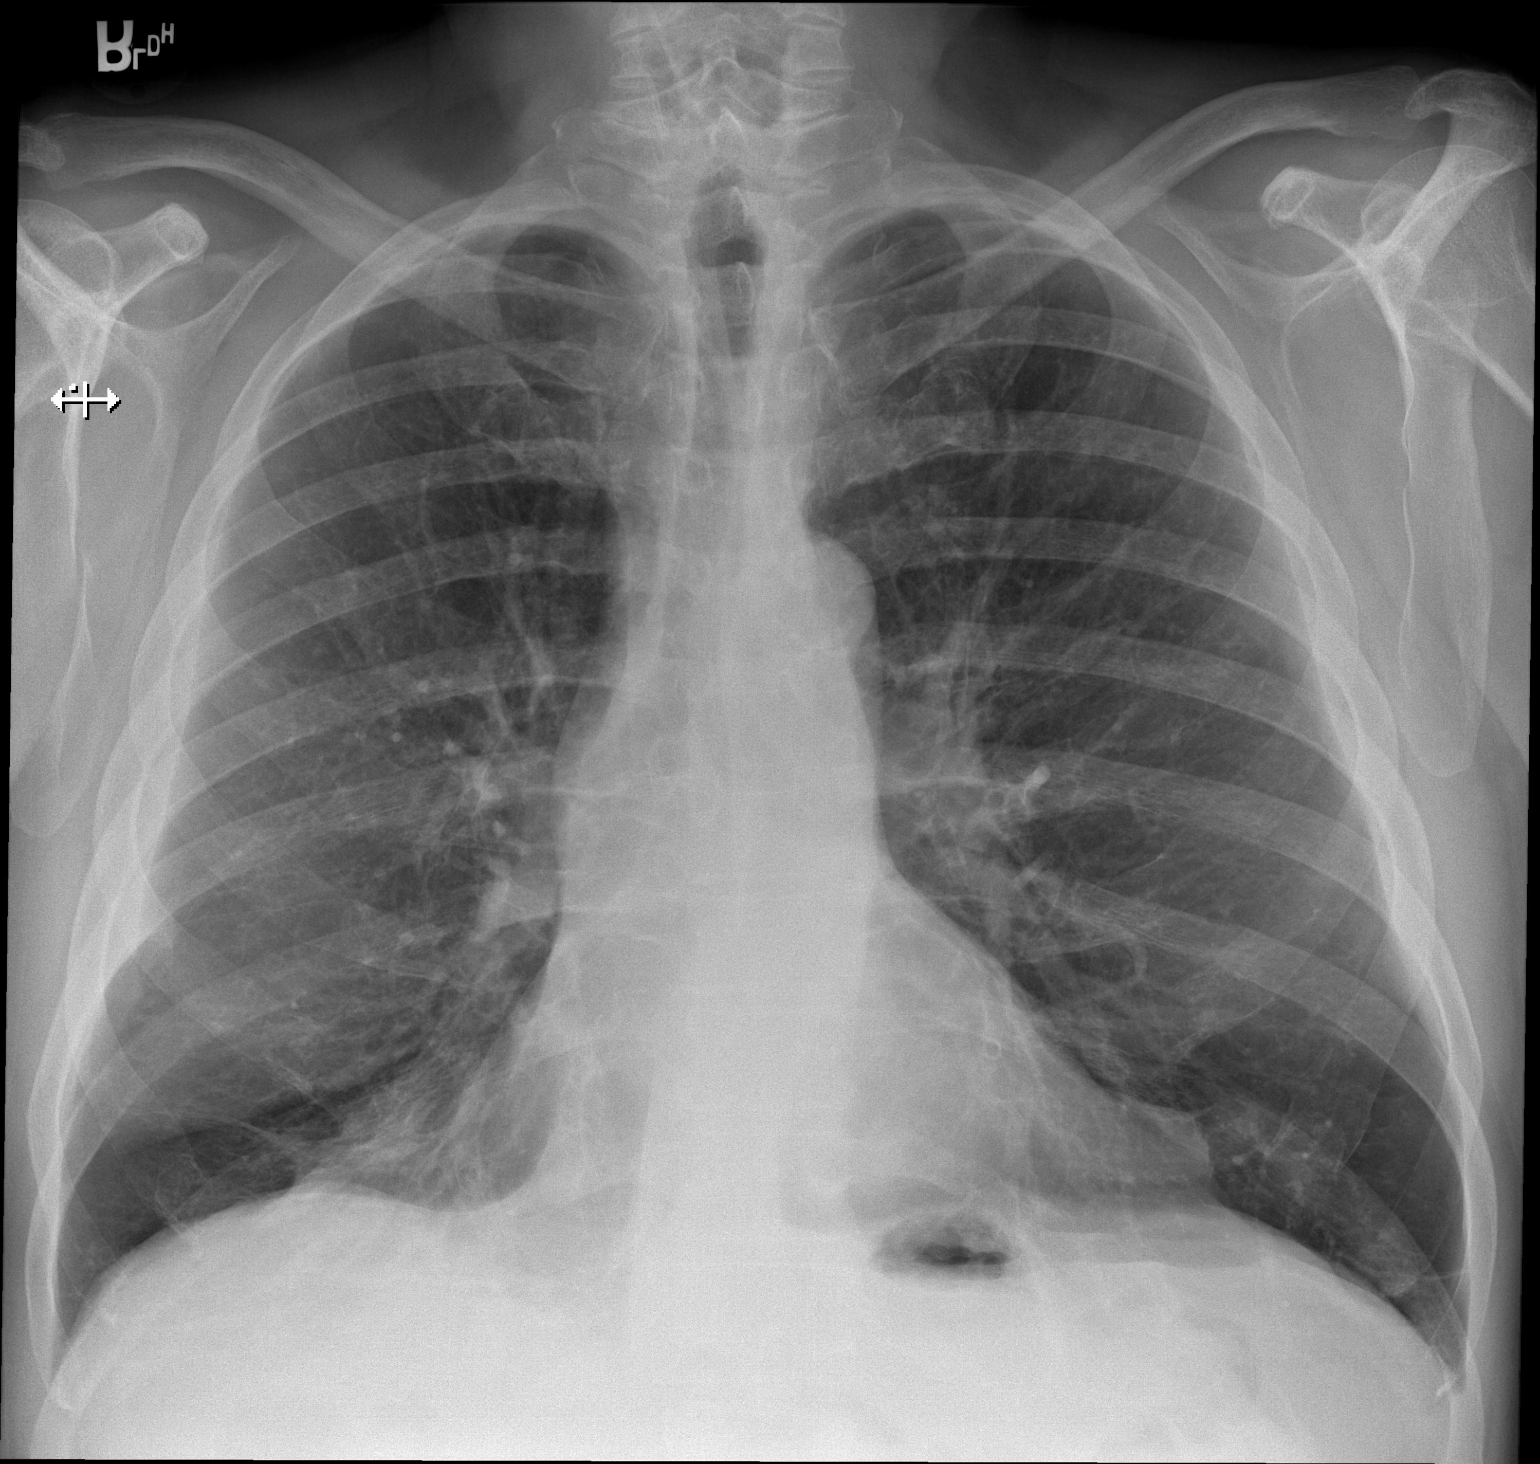
[im 2/2]
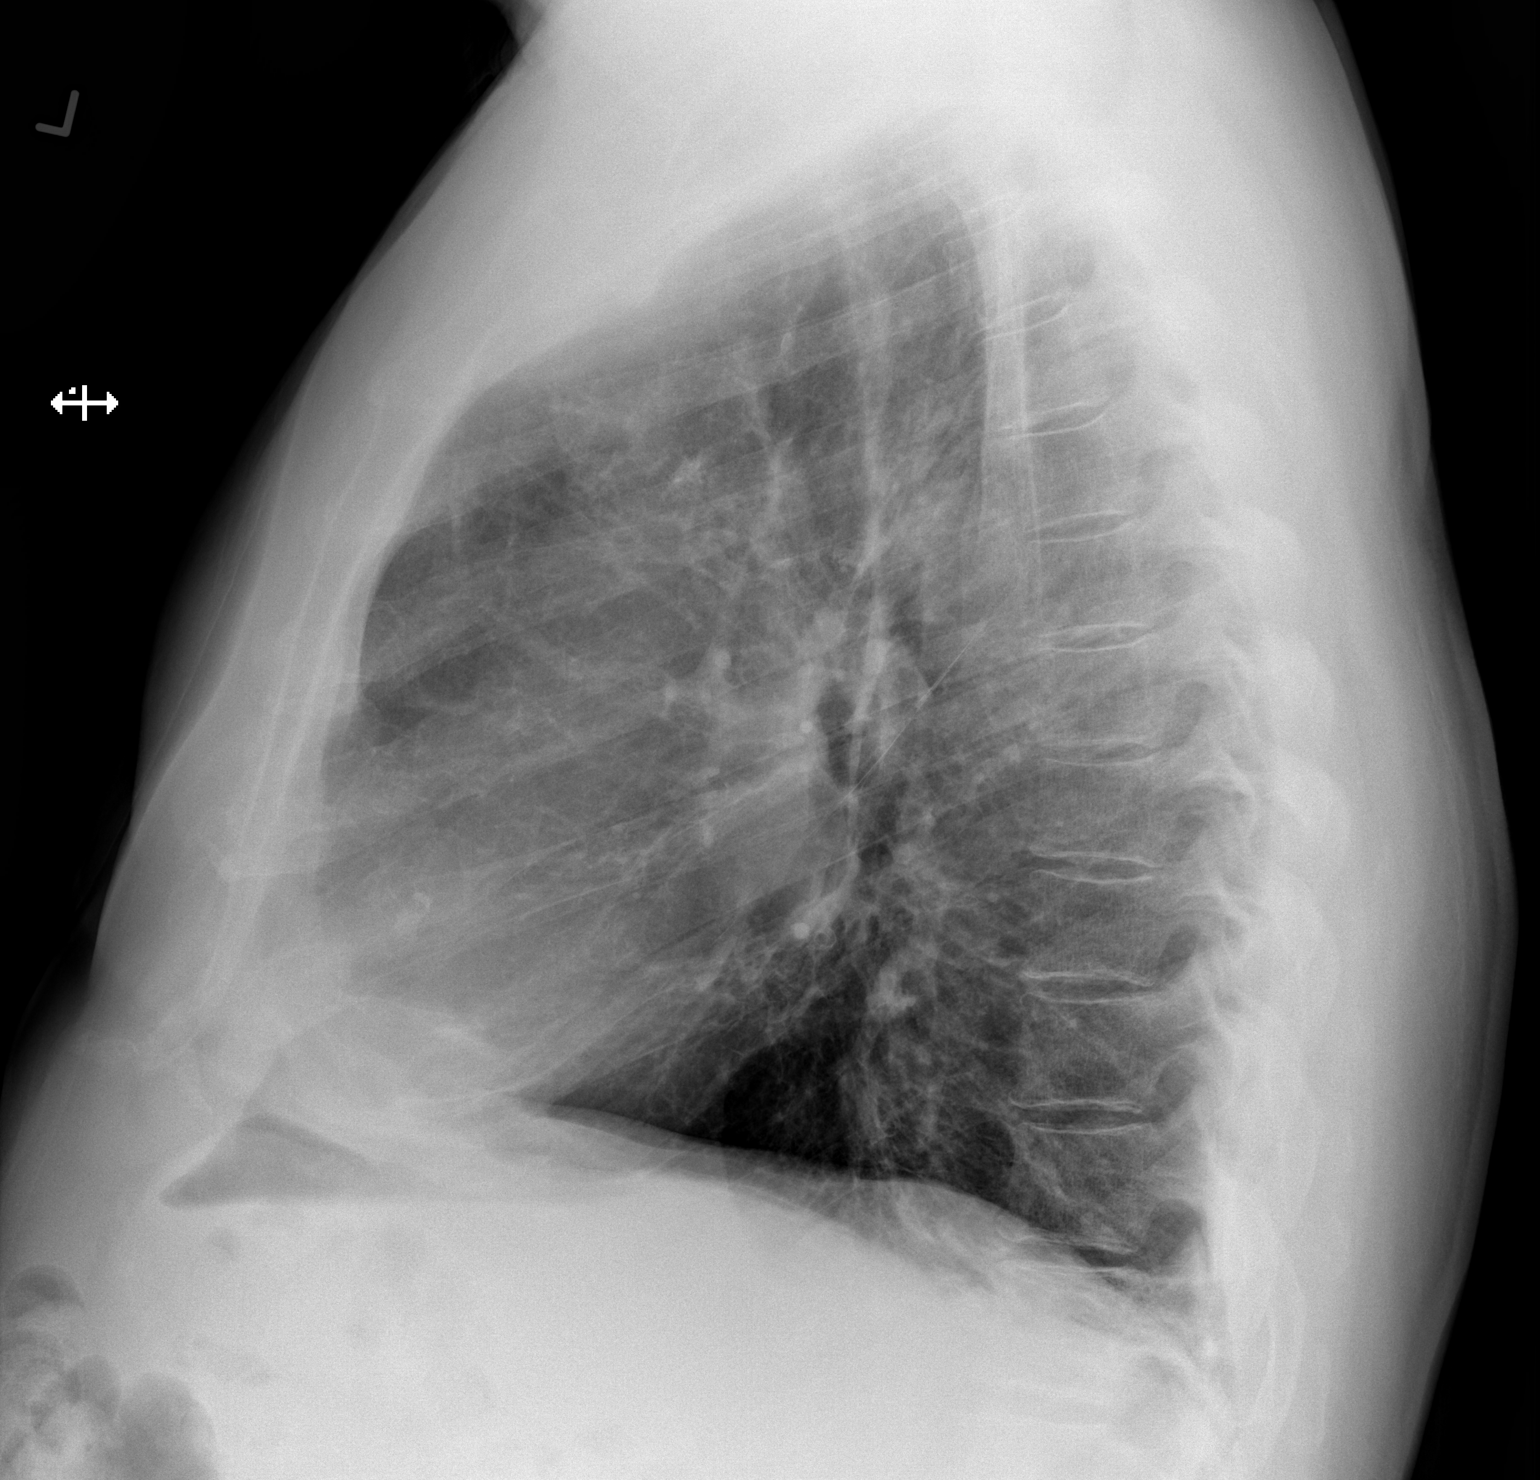

[2 of 2 positions shown; findings below may reference images not displayed]

FINDINGS: Normal mediastinum and cardiac silhouette. Hyperinflated lungs.
Normal pulmonary vasculature. No evidence of effusion, infiltrate,
or pneumothorax. No acute bony abnormality.
IMPRESSION: No acute cardiopulmonary process.  Hyperinflated lungs.

## 2017-06-02 DIAGNOSIS — J449 Chronic obstructive pulmonary disease, unspecified: Secondary | ICD-10-CM | POA: Diagnosis present

## 2017-06-02 DIAGNOSIS — M7752 Other enthesopathy of left foot: Secondary | ICD-10-CM | POA: Diagnosis not present

## 2017-06-02 DIAGNOSIS — R002 Palpitations: Secondary | ICD-10-CM | POA: Diagnosis not present

## 2017-06-02 DIAGNOSIS — M79671 Pain in right foot: Secondary | ICD-10-CM | POA: Diagnosis not present

## 2017-06-02 DIAGNOSIS — I491 Atrial premature depolarization: Secondary | ICD-10-CM | POA: Diagnosis not present

## 2017-06-02 DIAGNOSIS — M7751 Other enthesopathy of right foot: Secondary | ICD-10-CM | POA: Diagnosis not present

## 2017-06-02 DIAGNOSIS — M79672 Pain in left foot: Secondary | ICD-10-CM | POA: Diagnosis not present

## 2017-06-02 DIAGNOSIS — I10 Essential (primary) hypertension: Secondary | ICD-10-CM | POA: Diagnosis not present

## 2017-06-02 DIAGNOSIS — E785 Hyperlipidemia, unspecified: Secondary | ICD-10-CM | POA: Diagnosis not present

## 2017-06-03 DIAGNOSIS — N200 Calculus of kidney: Secondary | ICD-10-CM | POA: Diagnosis not present

## 2017-06-03 DIAGNOSIS — Z125 Encounter for screening for malignant neoplasm of prostate: Secondary | ICD-10-CM | POA: Diagnosis not present

## 2017-06-03 DIAGNOSIS — N401 Enlarged prostate with lower urinary tract symptoms: Secondary | ICD-10-CM | POA: Diagnosis not present

## 2017-06-03 DIAGNOSIS — R351 Nocturia: Secondary | ICD-10-CM | POA: Diagnosis not present

## 2017-06-03 DIAGNOSIS — R350 Frequency of micturition: Secondary | ICD-10-CM | POA: Diagnosis not present

## 2017-06-03 DIAGNOSIS — R3914 Feeling of incomplete bladder emptying: Secondary | ICD-10-CM | POA: Diagnosis not present

## 2017-06-03 DIAGNOSIS — I132 Hypertensive heart and chronic kidney disease with heart failure and with stage 5 chronic kidney disease, or end stage renal disease: Secondary | ICD-10-CM | POA: Diagnosis not present

## 2017-06-03 DIAGNOSIS — N400 Enlarged prostate without lower urinary tract symptoms: Secondary | ICD-10-CM | POA: Diagnosis not present

## 2017-06-30 DIAGNOSIS — M79643 Pain in unspecified hand: Secondary | ICD-10-CM | POA: Diagnosis not present

## 2017-06-30 DIAGNOSIS — M79673 Pain in unspecified foot: Secondary | ICD-10-CM | POA: Diagnosis not present

## 2017-08-04 DIAGNOSIS — M65331 Trigger finger, right middle finger: Secondary | ICD-10-CM | POA: Diagnosis not present

## 2017-08-04 DIAGNOSIS — M79641 Pain in right hand: Secondary | ICD-10-CM | POA: Diagnosis not present

## 2017-08-04 DIAGNOSIS — M79642 Pain in left hand: Secondary | ICD-10-CM | POA: Diagnosis not present

## 2017-08-10 ENCOUNTER — Inpatient Hospital Stay
Admission: EM | Admit: 2017-08-10 | Discharge: 2017-08-12 | DRG: 247 | Disposition: A | Discharge status: Other Acute Inpatient Hospital | Payer: Medicare HMO | Attending: Internal Medicine | Admitting: Internal Medicine

## 2017-08-10 ENCOUNTER — Other Ambulatory Visit: Payer: Self-pay

## 2017-08-10 ENCOUNTER — Encounter: Payer: Self-pay | Admitting: Emergency Medicine

## 2017-08-10 ENCOUNTER — Encounter
Admission: EM | Disposition: A | Discharge status: Other Acute Inpatient Hospital | Payer: Self-pay | Source: Home / Self Care | Attending: Internal Medicine

## 2017-08-10 ENCOUNTER — Inpatient Hospital Stay
Admit: 2017-08-10 | Discharge: 2017-08-10 | Disposition: A | Discharge status: Home / Self Care | Payer: Medicare HMO | Attending: Cardiovascular Disease | Admitting: Cardiovascular Disease

## 2017-08-10 DIAGNOSIS — E119 Type 2 diabetes mellitus without complications: Secondary | ICD-10-CM | POA: Diagnosis not present

## 2017-08-10 DIAGNOSIS — J449 Chronic obstructive pulmonary disease, unspecified: Secondary | ICD-10-CM | POA: Diagnosis present

## 2017-08-10 DIAGNOSIS — I2510 Atherosclerotic heart disease of native coronary artery without angina pectoris: Secondary | ICD-10-CM

## 2017-08-10 DIAGNOSIS — R079 Chest pain, unspecified: Secondary | ICD-10-CM

## 2017-08-10 DIAGNOSIS — I2102 ST elevation (STEMI) myocardial infarction involving left anterior descending coronary artery: Secondary | ICD-10-CM | POA: Diagnosis not present

## 2017-08-10 DIAGNOSIS — Z79899 Other long term (current) drug therapy: Secondary | ICD-10-CM

## 2017-08-10 DIAGNOSIS — I2109 ST elevation (STEMI) myocardial infarction involving other coronary artery of anterior wall: Secondary | ICD-10-CM

## 2017-08-10 DIAGNOSIS — N189 Chronic kidney disease, unspecified: Secondary | ICD-10-CM | POA: Diagnosis not present

## 2017-08-10 DIAGNOSIS — I10 Essential (primary) hypertension: Secondary | ICD-10-CM | POA: Diagnosis not present

## 2017-08-10 DIAGNOSIS — H409 Unspecified glaucoma: Secondary | ICD-10-CM | POA: Diagnosis not present

## 2017-08-10 DIAGNOSIS — E785 Hyperlipidemia, unspecified: Secondary | ICD-10-CM | POA: Diagnosis not present

## 2017-08-10 DIAGNOSIS — N183 Chronic kidney disease, stage 3 (moderate): Secondary | ICD-10-CM | POA: Diagnosis present

## 2017-08-10 DIAGNOSIS — R072 Precordial pain: Secondary | ICD-10-CM

## 2017-08-10 DIAGNOSIS — E1122 Type 2 diabetes mellitus with diabetic chronic kidney disease: Secondary | ICD-10-CM | POA: Diagnosis not present

## 2017-08-10 DIAGNOSIS — Z85828 Personal history of other malignant neoplasm of skin: Secondary | ICD-10-CM | POA: Diagnosis not present

## 2017-08-10 DIAGNOSIS — I129 Hypertensive chronic kidney disease with stage 1 through stage 4 chronic kidney disease, or unspecified chronic kidney disease: Secondary | ICD-10-CM | POA: Diagnosis not present

## 2017-08-10 DIAGNOSIS — F17210 Nicotine dependence, cigarettes, uncomplicated: Secondary | ICD-10-CM | POA: Diagnosis present

## 2017-08-10 DIAGNOSIS — R0981 Nasal congestion: Secondary | ICD-10-CM | POA: Diagnosis present

## 2017-08-10 DIAGNOSIS — Z794 Long term (current) use of insulin: Secondary | ICD-10-CM

## 2017-08-10 DIAGNOSIS — Z9889 Other specified postprocedural states: Secondary | ICD-10-CM | POA: Diagnosis not present

## 2017-08-10 DIAGNOSIS — I213 ST elevation (STEMI) myocardial infarction of unspecified site: Secondary | ICD-10-CM | POA: Diagnosis not present

## 2017-08-10 HISTORY — PX: CORONARY/GRAFT ACUTE MI REVASCULARIZATION: CATH118305

## 2017-08-10 HISTORY — PX: LEFT HEART CATH AND CORONARY ANGIOGRAPHY: CATH118249

## 2017-08-10 LAB — HEMOGLOBIN A1C
HEMOGLOBIN A1C: 6.7 % — AB (ref 4.8–5.6)
Mean Plasma Glucose: 145.59 mg/dL

## 2017-08-10 LAB — CBC WITH DIFFERENTIAL/PLATELET
BASOS PCT: 1 %
Basophils Absolute: 0.2 10*3/uL — ABNORMAL HIGH (ref 0–0.1)
EOS ABS: 0.5 10*3/uL (ref 0–0.7)
Eosinophils Relative: 5 %
HEMATOCRIT: 46.6 % (ref 40.0–52.0)
Hemoglobin: 16.5 g/dL (ref 13.0–18.0)
Lymphocytes Relative: 23 %
Lymphs Abs: 2.5 10*3/uL (ref 1.0–3.6)
MCH: 31.3 pg (ref 26.0–34.0)
MCHC: 35.3 g/dL (ref 32.0–36.0)
MCV: 88.6 fL (ref 80.0–100.0)
MONO ABS: 1.5 10*3/uL — AB (ref 0.2–1.0)
MONOS PCT: 14 %
NEUTROS ABS: 6.1 10*3/uL (ref 1.4–6.5)
Neutrophils Relative %: 57 %
PLATELETS: 253 10*3/uL (ref 150–440)
RBC: 5.27 MIL/uL (ref 4.40–5.90)
RDW: 14 % (ref 11.5–14.5)
WBC: 10.7 10*3/uL — ABNORMAL HIGH (ref 3.8–10.6)

## 2017-08-10 LAB — COMPREHENSIVE METABOLIC PANEL
ALT: 27 U/L (ref 17–63)
ANION GAP: 6 (ref 5–15)
AST: 30 U/L (ref 15–41)
Albumin: 4.1 g/dL (ref 3.5–5.0)
Alkaline Phosphatase: 61 U/L (ref 38–126)
BILIRUBIN TOTAL: 0.5 mg/dL (ref 0.3–1.2)
BUN: 25 mg/dL — ABNORMAL HIGH (ref 6–20)
CHLORIDE: 100 mmol/L — AB (ref 101–111)
CO2: 31 mmol/L (ref 22–32)
Calcium: 10 mg/dL (ref 8.9–10.3)
Creatinine, Ser: 1.15 mg/dL (ref 0.61–1.24)
Glucose, Bld: 155 mg/dL — ABNORMAL HIGH (ref 65–99)
POTASSIUM: 4.3 mmol/L (ref 3.5–5.1)
Sodium: 137 mmol/L (ref 135–145)
TOTAL PROTEIN: 7.1 g/dL (ref 6.5–8.1)

## 2017-08-10 LAB — GLUCOSE, CAPILLARY
Glucose-Capillary: 181 mg/dL — ABNORMAL HIGH (ref 65–99)
Glucose-Capillary: 195 mg/dL — ABNORMAL HIGH (ref 65–99)
Glucose-Capillary: 167 mg/dL — ABNORMAL HIGH (ref 65–99)
Glucose-Capillary: 158 mg/dL — ABNORMAL HIGH (ref 65–99)

## 2017-08-10 LAB — LIPID PANEL
CHOL/HDL RATIO: 3.8 ratio
CHOLESTEROL: 132 mg/dL (ref 0–200)
HDL: 35 mg/dL — AB (ref >40)
LDL Cholesterol: 65 mg/dL (ref 0–99)
TRIGLYCERIDES: 159 mg/dL — AB (ref <150)
VLDL: 32 mg/dL (ref 0–40)

## 2017-08-10 LAB — POCT ACTIVATED CLOTTING TIME: Activated Clotting Time: 544 seconds

## 2017-08-10 LAB — PROTIME-INR
INR: 0.91
PROTHROMBIN TIME: 12.2 s (ref 11.4–15.2)

## 2017-08-10 LAB — TROPONIN I
TROPONIN I: 0.03 ng/mL — AB (ref <0.03)
TROPONIN I: 0.52 ng/mL — AB (ref <0.03)
TROPONIN I: 1.27 ng/mL — AB (ref <0.03)
Troponin I: 2.24 ng/mL (ref <0.03)

## 2017-08-10 LAB — MRSA PCR SCREENING: MRSA by PCR: NEGATIVE

## 2017-08-10 LAB — APTT: aPTT: 28 seconds (ref 24–36)

## 2017-08-10 SURGERY — CORONARY/GRAFT ACUTE MI REVASCULARIZATION

## 2017-08-10 MED ORDER — ALPRAZOLAM 0.5 MG PO TABS
0.5000 mg | ORAL_TABLET | Freq: Three times a day (TID) | ORAL | Status: DC | PRN
  Administered 2017-08-10 – 2017-08-12 (×2): 0.5 mg via ORAL
  Filled 2017-08-10 (×2): qty 1

## 2017-08-10 MED ORDER — ASPIRIN 81 MG PO CHEW: 324.0000 mg | CHEWABLE_TABLET | Freq: Once | ORAL | Status: DC

## 2017-08-10 MED ORDER — LABETALOL HCL 5 MG/ML IV SOLN: 10.0000 mg | INTRAVENOUS | Status: AC | PRN

## 2017-08-10 MED ORDER — FLUTICASONE PROPIONATE 50 MCG/ACT NA SUSP
1.0000 | Freq: Every day | NASAL | Status: DC
  Administered 2017-08-10 – 2017-08-12 (×4): 1 via NASAL
  Filled 2017-08-10: qty 16

## 2017-08-10 MED ORDER — LORATADINE 10 MG PO TABS
10.0000 mg | ORAL_TABLET | Freq: Every day | ORAL | Status: DC
  Administered 2017-08-10 – 2017-08-12 (×3): 10 mg via ORAL
  Filled 2017-08-10 (×3): qty 1

## 2017-08-10 MED ORDER — ONDANSETRON HCL 4 MG/2ML IJ SOLN: 4.0000 mg | Freq: Four times a day (QID) | INTRAMUSCULAR | Status: DC | PRN

## 2017-08-10 MED ORDER — VERAPAMIL HCL 2.5 MG/ML IV SOLN
INTRAVENOUS | Status: AC
  Filled 2017-08-10: qty 2

## 2017-08-10 MED ORDER — FENTANYL CITRATE (PF) 100 MCG/2ML IJ SOLN
INTRAMUSCULAR | Status: DC | PRN
  Administered 2017-08-10 (×2): 25 ug via INTRAVENOUS

## 2017-08-10 MED ORDER — ASPIRIN 81 MG PO CHEW
81.0000 mg | CHEWABLE_TABLET | Freq: Every day | ORAL | Status: DC
  Administered 2017-08-11 – 2017-08-12 (×2): 81 mg via ORAL
  Filled 2017-08-10 (×2): qty 1

## 2017-08-10 MED ORDER — NITROGLYCERIN 0.4 MG SL SUBL
SUBLINGUAL_TABLET | SUBLINGUAL | Status: AC | PRN
  Administered 2017-08-10 (×2): 0.4 mg via SUBLINGUAL

## 2017-08-10 MED ORDER — INSULIN DETEMIR 100 UNIT/ML ~~LOC~~ SOLN
50.0000 [IU] | Freq: Two times a day (BID) | SUBCUTANEOUS | Status: DC
  Administered 2017-08-10 – 2017-08-12 (×4): 50 [IU] via SUBCUTANEOUS
  Filled 2017-08-10 (×7): qty 0.5

## 2017-08-10 MED ORDER — VERAPAMIL HCL 2.5 MG/ML IV SOLN
INTRAVENOUS | Status: DC | PRN
  Administered 2017-08-10: 1 mL via INTRA_ARTERIAL

## 2017-08-10 MED ORDER — TIROFIBAN HCL IN NACL 5-0.9 MG/100ML-% IV SOLN
INTRAVENOUS | Status: AC
  Filled 2017-08-10: qty 100

## 2017-08-10 MED ORDER — MIDAZOLAM HCL 2 MG/2ML IJ SOLN
INTRAMUSCULAR | Status: DC | PRN
  Administered 2017-08-10: 1 mg via INTRAVENOUS

## 2017-08-10 MED ORDER — ENOXAPARIN SODIUM 40 MG/0.4ML ~~LOC~~ SOLN: 40.0000 mg | SUBCUTANEOUS | Status: DC

## 2017-08-10 MED ORDER — INSULIN DETEMIR 100 UNIT/ML ~~LOC~~ SOLN
25.0000 [IU] | Freq: Two times a day (BID) | SUBCUTANEOUS | Status: DC
  Administered 2017-08-10: 25 [IU] via SUBCUTANEOUS
  Filled 2017-08-10 (×2): qty 0.25

## 2017-08-10 MED ORDER — INSULIN ASPART 100 UNIT/ML ~~LOC~~ SOLN
0.0000 [IU] | Freq: Three times a day (TID) | SUBCUTANEOUS | Status: DC
  Administered 2017-08-10 (×3): 2 [IU] via SUBCUTANEOUS
  Administered 2017-08-11: 1 [IU] via SUBCUTANEOUS
  Administered 2017-08-11: 2 [IU] via SUBCUTANEOUS
  Administered 2017-08-11: 3 [IU] via SUBCUTANEOUS
  Administered 2017-08-12: 2 [IU] via SUBCUTANEOUS
  Filled 2017-08-10 (×6): qty 1

## 2017-08-10 MED ORDER — TICAGRELOR 90 MG PO TABS
90.0000 mg | ORAL_TABLET | Freq: Two times a day (BID) | ORAL | Status: DC
  Administered 2017-08-10 – 2017-08-12 (×4): 90 mg via ORAL
  Filled 2017-08-10 (×4): qty 1

## 2017-08-10 MED ORDER — TICAGRELOR 90 MG PO TABS
ORAL_TABLET | ORAL | Status: AC
  Filled 2017-08-10: qty 2

## 2017-08-10 MED ORDER — NITROGLYCERIN 0.4 MG SL SUBL
0.4000 mg | SUBLINGUAL_TABLET | SUBLINGUAL | Status: DC | PRN
  Administered 2017-08-10: 0.4 mg via SUBLINGUAL
  Filled 2017-08-10: qty 1

## 2017-08-10 MED ORDER — HEPARIN SODIUM (PORCINE) 1000 UNIT/ML IJ SOLN
INTRAMUSCULAR | Status: AC
  Filled 2017-08-10: qty 1

## 2017-08-10 MED ORDER — ACETAMINOPHEN 325 MG PO TABS: 650.0000 mg | ORAL_TABLET | ORAL | Status: DC | PRN

## 2017-08-10 MED ORDER — LIDOCAINE HCL (PF) 1 % IJ SOLN
INTRAMUSCULAR | Status: AC
  Filled 2017-08-10: qty 30

## 2017-08-10 MED ORDER — TIROFIBAN HCL IN NACL 5-0.9 MG/100ML-% IV SOLN
0.1500 ug/kg/min | INTRAVENOUS | Status: AC
  Administered 2017-08-10: 0.15 ug/kg/min via INTRAVENOUS
  Filled 2017-08-10: qty 100

## 2017-08-10 MED ORDER — IOPAMIDOL (ISOVUE-300) INJECTION 61%
INTRAVENOUS | Status: DC | PRN
  Administered 2017-08-10: 210 mL via INTRA_ARTERIAL

## 2017-08-10 MED ORDER — HEPARIN SODIUM (PORCINE) 5000 UNIT/ML IJ SOLN
4000.0000 [IU] | Freq: Once | INTRAMUSCULAR | Status: AC
  Administered 2017-08-10: 4000 [IU] via INTRAVENOUS

## 2017-08-10 MED ORDER — TIROFIBAN HCL IN NACL 5-0.9 MG/100ML-% IV SOLN
INTRAVENOUS | Status: AC | PRN
  Administered 2017-08-10: 0.15 ug/kg/min via INTRAVENOUS

## 2017-08-10 MED ORDER — NICOTINE 14 MG/24HR TD PT24
14.0000 mg | MEDICATED_PATCH | Freq: Every day | TRANSDERMAL | Status: DC
  Administered 2017-08-10 – 2017-08-12 (×3): 14 mg via TRANSDERMAL
  Filled 2017-08-10 (×3): qty 1

## 2017-08-10 MED ORDER — ATORVASTATIN CALCIUM 20 MG PO TABS
80.0000 mg | ORAL_TABLET | Freq: Every day | ORAL | Status: DC
  Administered 2017-08-10 – 2017-08-11 (×2): 80 mg via ORAL
  Filled 2017-08-10 (×3): qty 4

## 2017-08-10 MED ORDER — SODIUM CHLORIDE 0.9 % IV SOLN
INTRAVENOUS | Status: DC
  Administered 2017-08-10: 10 mL/h via INTRAVENOUS

## 2017-08-10 MED ORDER — SODIUM CHLORIDE 0.9% FLUSH: 3.0000 mL | INTRAVENOUS | Status: DC | PRN

## 2017-08-10 MED ORDER — SODIUM CHLORIDE 0.9 % IV SOLN
INTRAVENOUS | Status: AC
  Administered 2017-08-10: 09:00:00 via INTRAVENOUS

## 2017-08-10 MED ORDER — SODIUM CHLORIDE 0.9% FLUSH
3.0000 mL | Freq: Two times a day (BID) | INTRAVENOUS | Status: DC
  Administered 2017-08-10 – 2017-08-12 (×5): 3 mL via INTRAVENOUS

## 2017-08-10 MED ORDER — HYDRALAZINE HCL 20 MG/ML IJ SOLN: 5.0000 mg | INTRAMUSCULAR | Status: AC | PRN

## 2017-08-10 MED ORDER — ALBUTEROL SULFATE (2.5 MG/3ML) 0.083% IN NEBU: 3.0000 mL | INHALATION_SOLUTION | Freq: Four times a day (QID) | RESPIRATORY_TRACT | Status: DC | PRN

## 2017-08-10 MED ORDER — PERFLUTREN LIPID MICROSPHERE
1.0000 mL | INTRAVENOUS | Status: AC | PRN
  Administered 2017-08-10: 9 mL via INTRAVENOUS
  Filled 2017-08-10: qty 10

## 2017-08-10 MED ORDER — TICAGRELOR 90 MG PO TABS
ORAL_TABLET | ORAL | Status: DC | PRN
  Administered 2017-08-10: 180 mg via ORAL

## 2017-08-10 MED ORDER — SODIUM CHLORIDE 0.9 % IV SOLN: 250.0000 mL | INTRAVENOUS | Status: DC | PRN

## 2017-08-10 MED ORDER — TIROFIBAN (AGGRASTAT) BOLUS VIA INFUSION
INTRAVENOUS | Status: DC | PRN
  Administered 2017-08-10: 2437.5 ug via INTRAVENOUS

## 2017-08-10 MED ORDER — FENTANYL CITRATE (PF) 100 MCG/2ML IJ SOLN
INTRAMUSCULAR | Status: AC
  Filled 2017-08-10: qty 2

## 2017-08-10 MED ORDER — INSULIN ASPART 100 UNIT/ML ~~LOC~~ SOLN: 0.0000 [IU] | Freq: Every day | SUBCUTANEOUS | Status: DC

## 2017-08-10 MED ORDER — HEPARIN SODIUM (PORCINE) 1000 UNIT/ML IJ SOLN
INTRAMUSCULAR | Status: DC | PRN
  Administered 2017-08-10: 8000 [IU] via INTRAVENOUS

## 2017-08-10 MED ORDER — METOPROLOL TARTRATE 25 MG PO TABS
25.0000 mg | ORAL_TABLET | Freq: Two times a day (BID) | ORAL | Status: DC
  Administered 2017-08-10 – 2017-08-11 (×4): 25 mg via ORAL
  Filled 2017-08-10 (×5): qty 1

## 2017-08-10 MED ORDER — IRBESARTAN 150 MG PO TABS
300.0000 mg | ORAL_TABLET | Freq: Every day | ORAL | Status: DC
  Administered 2017-08-10 – 2017-08-11 (×2): 300 mg via ORAL
  Filled 2017-08-10 (×3): qty 2

## 2017-08-10 MED ORDER — SODIUM CHLORIDE 0.45 % IV SOLN
INTRAVENOUS | Status: DC
  Administered 2017-08-10: 21:00:00 via INTRAVENOUS

## 2017-08-10 MED ORDER — MIDAZOLAM HCL 2 MG/2ML IJ SOLN
INTRAMUSCULAR | Status: AC
  Filled 2017-08-10: qty 2

## 2017-08-10 MED ORDER — FLUTICASONE FUROATE-VILANTEROL 200-25 MCG/INH IN AEPB
1.0000 | INHALATION_SPRAY | Freq: Every day | RESPIRATORY_TRACT | Status: DC
  Administered 2017-08-10 – 2017-08-12 (×3): 1 via RESPIRATORY_TRACT
  Filled 2017-08-10: qty 28

## 2017-08-10 SURGICAL SUPPLY — 16 items
BALLN MINITREK RX 2.0X12 (BALLOONS) ×3
BALLN ~~LOC~~ EUPHORA RX 2.75X15 (BALLOONS) ×3
BALLOON MINITREK RX 2.0X12 (BALLOONS) IMPLANT
BALLOON ~~LOC~~ EUPHORA RX 2.75X15 (BALLOONS) IMPLANT
CATH INFINITI 5FR ANG PIGTAIL (CATHETERS) ×2 IMPLANT
CATH INFINITI JR4 5F (CATHETERS) ×2 IMPLANT
CATH VISTA GUIDE 6FR XBLAD3.5 (CATHETERS) ×2 IMPLANT
DEVICE INFLAT 30 PLUS (MISCELLANEOUS) ×2 IMPLANT
DEVICE RAD COMP TR BAND LRG (VASCULAR PRODUCTS) ×2 IMPLANT
NDL PERC 21GX4CM (NEEDLE) IMPLANT
NEEDLE PERC 21GX4CM (NEEDLE) ×3 IMPLANT
SHEATH RAIN RADIAL 21G 6FR (SHEATH) ×2 IMPLANT
STENT SIERRA 2.50 X 12 MM (Permanent Stent) ×2 IMPLANT
STENT SIERRA 2.50 X 28 MM (Permanent Stent) ×2 IMPLANT
WIRE G HI TQ BMW 190 (WIRE) ×2 IMPLANT
WIRE ROSEN-J .035X260CM (WIRE) ×2 IMPLANT

## 2017-08-10 NOTE — ED Provider Notes (Signed)
[facility] Emergency Department Provider Note  ____________________________________________  Time seen: Approximately 5:00 AM  I have reviewed the triage vital signs and the nursing notes.   HISTORY  Chief Complaint Code STEMI    HPI Henry [name] is a 72 y.o. male With a history of diabetes and CK D who was awakened at 3:30 AM this morning with sudden onset of central chest pain, nonradiating, no diaphoresis vomiting or shortness of breath. Feels like tightness, severe. No aggravating or alleviating factors. He called EMS who did an initial EKG which was concerning for STEMI. Gave the patient and nitroglycerin spray, symptoms resolved and EKG normalized, and then he had sudden return of chest pain, and repeat EKG again was consistent with STEMI. Patient is still having central chest pain currently.  Last oral intake 9:00pm last night.    Past Medical History:  Diagnosis Date  . Arthritis   . Cancer ([place])    skin  . Chronic kidney disease   . Diabetes mellitus without complication ([place])   . Glaucoma   . Hyperlipidemia   . Hypertension      Patient Active Problem List   Diagnosis Date Noted  . CAP (community acquired pneumonia) 01/25/2016  . Diabetes ([place]) 01/25/2016  . HTN (hypertension) 01/25/2016  . HLD (hyperlipidemia) 01/25/2016     Past Surgical History:  Procedure Laterality Date  . COLONOSCOPY WITH PROPOFOL N/A 12/08/2014   Procedure: COLONOSCOPY WITH PROPOFOL;  Surgeon: Manya Silvas, MD;  Location: [facility] ENDOSCOPY;  Service: Endoscopy;  Laterality: N/A;  . COLONOSCOPY WITH PROPOFOL N/A 01/10/2016   Procedure: COLONOSCOPY WITH PROPOFOL;  Surgeon: Manya Silvas, MD;  Location: [facility] ENDOSCOPY;  Service: Endoscopy;  Laterality: N/A;  . EXTRACORPOREAL SHOCK WAVE LITHOTRIPSY Right 11/09/2015   Procedure: EXTRACORPOREAL SHOCK WAVE LITHOTRIPSY (ESWL);  Surgeon: Royston Cowper, MD;  Location: [facility] ORS;  Service: Urology;  Laterality:  Right;  . EXTRACORPOREAL SHOCK WAVE LITHOTRIPSY Left 10/17/2016   Procedure: EXTRACORPOREAL SHOCK WAVE LITHOTRIPSY (ESWL);  Surgeon: Royston Cowper, MD;  Location: [facility] ORS;  Service: Urology;  Laterality: Left;  . LITHOTRIPSY     x 6  . PAROTIDECTOMY  2015  . TONSILLECTOMY       Prior to Admission medications   Medication Sig Start Date End Date Taking? Authorizing Provider  acetaminophen (TYLENOL) 325 MG tablet Take 2 tablets (650 mg total) by mouth every 6 (six) hours as needed for mild pain (or Fever >/= 101). 01/27/16   Gouru, Illene Silver, MD  albuterol (PROVENTIL HFA;VENTOLIN HFA) 108 (90 Base) MCG/ACT inhaler Inhale 2 puffs into the lungs every 6 (six) hours as needed for wheezing or shortness of breath. 01/27/16   Gouru, Illene Silver, MD  fluticasone (FLONASE) 50 MCG/ACT nasal spray Place 1 spray into both nostrils daily.    [provider]  guaiFENesin-dextromethorphan (ROBITUSSIN DM) 100-10 MG/5ML syrup Take 10 mLs by mouth every 6 (six) hours as needed for cough. Patient not taking: Reported on 10/17/2016 01/27/16   Nicholes Mango, MD  HYDROcodone-acetaminophen (NORCO) 10-325 MG tablet Take 1-2 tablets by mouth every 4 (four) hours as needed for moderate pain. Maximum dose per 24 hours -8 pills. 11/09/15   Royston Cowper, MD  HYDROcodone-acetaminophen (NORCO) 10-325 MG tablet Take 1-2 tablets by mouth every 4 (four) hours as needed for moderate pain. Maximum dose per 24 hours -8 pills. 10/17/16   Royston Cowper, MD  insulin detemir (LEVEMIR) 100 UNIT/ML injection Inject 0.4 mLs (40 Units total) into the  skin every morning. 01/27/16   Gouru, Illene Silver, MD  insulin detemir (LEVEMIR) 100 UNIT/ML injection Inject 0.35 mLs (35 Units total) into the skin at bedtime. 01/27/16   Nicholes Mango, MD  levofloxacin (LEVAQUIN) 500 MG tablet Take 1 tablet (500 mg total) by mouth daily. 10/17/16   Royston Cowper, MD  multivitamin-iron-minerals-folic acid (CENTRUM) chewable tablet Chew 1 tablet by mouth  daily.    [provider]  ondansetron (ZOFRAN ODT) 8 MG disintegrating tablet Take 1 tablet (8 mg total) by mouth every 6 (six) hours as needed for nausea or vomiting. 10/17/16   Royston Cowper, MD  simvastatin (ZOCOR) 40 MG tablet Take 40 mg by mouth daily.    [provider]  valsartan (DIOVAN) 320 MG tablet Take 320 mg by mouth daily.    [provider]     Allergies Patient has no known allergies.   Family History  Problem Relation Age of Onset  . Heart attack Mother   . Melanoma Father   . Parkinsonism Father     Social History Social History   Tobacco Use  . Smoking status: Current Every Day Smoker    Packs/day: 0.10    Types: Cigarettes  . Smokeless tobacco: Never Used  Substance Use Topics  . Alcohol use: No  . Drug use: No    Review of Systems  Constitutional:   No fever or chills.  ENT:   No sore throat. No rhinorrhea. Cardiovascular:   positive as above chest pain without syncope. Respiratory:   No dyspnea or cough. Gastrointestinal:   Negative for abdominal pain, vomiting and diarrhea.  Musculoskeletal:   Negative for focal pain or swelling All other systems reviewed and are negative except as documented above in ROS and HPI.  ____________________________________________   PHYSICAL EXAM:  VITAL SIGNS: ED Triage Vitals  Enc Vitals Group     BP 08/10/17 0512 (!) 148/71     Pulse Rate 08/10/17 0512 89     Resp 08/10/17 0512 (!) 25     Temp 08/10/17 0512 98 F (36.7 C)     Temp Source 08/10/17 0512 Oral     SpO2 08/10/17 0512 95 %     Weight 08/10/17 0507 215 lb (97.5 kg)     Height 08/10/17 0507 5\' 11"  (1.803 m)     Head Circumference --      Peak Flow --      Pain Score 08/10/17 0507 8     Pain Loc --      Pain Edu? --      Excl. in [place]? --     Vital signs reviewed, nursing assessments reviewed.   Constitutional:   Alert and oriented. Well appearing and in no distress. Eyes:   Conjunctivae are normal. EOMI.  PERRL. ENT      Head:   Normocephalic and atraumatic.      Nose:   No congestion/rhinnorhea.       Mouth/Throat:   MMM, no pharyngeal erythema. No peritonsillar mass.       Neck:   No meningismus. Full ROM. Hematological/Lymphatic/Immunilogical:   No cervical lymphadenopathy. Cardiovascular:   RRR. Symmetric bilateral radial and DP pulses.  No murmurs.  Respiratory:   Normal respiratory effort without tachypnea/retractions. Breath sounds are clear and equal bilaterally. No wheezes/rales/rhonchi. Gastrointestinal:   Soft and nontender. Non distended. There is no CVA tenderness.  No rebound, rigidity, or guarding. Genitourinary:   deferred Musculoskeletal:   Normal range of motion in all extremities.  No joint effusions.  No lower extremity tenderness.  No edema.chest wall nontender Neurologic:   Normal speech and language.  Motor grossly intact. No acute focal neurologic deficits are appreciated.  Skin:    Skin is warm, dry and intact. No rash noted.  No petechiae, purpura, or bullae.  ____________________________________________    LABS (pertinent positives/negatives) (all labs ordered are listed, but only abnormal results are displayed) Labs Reviewed  CBC WITH DIFFERENTIAL/PLATELET  PROTIME-INR  APTT  COMPREHENSIVE METABOLIC PANEL  TROPONIN I  LIPID PANEL   ____________________________________________   EKG  interpreted by me Sinus rhythm rate of 93, normal axis intervals QRS ST segments and T waves. Occasional premature atrial contraction  ____________________________________________    RADIOLOGY  No results found.  ____________________________________________   PROCEDURES .Critical Care Performed by: Carrie Mew, MD Authorized by: Carrie Mew, MD   Critical care provider statement:    Critical care time (minutes):  30   Critical care time was exclusive of:  Separately billable procedures and treating other patients   Critical care was necessary  to treat or prevent imminent or life-threatening deterioration of the following conditions:  Cardiac failure   Critical care was time spent personally by me on the following activities:  Development of treatment plan with patient or surrogate, discussions with consultants, evaluation of patient's response to treatment, examination of patient, obtaining history from patient or surrogate, ordering and performing treatments and interventions, ordering and review of laboratory studies, ordering and review of radiographic studies, pulse oximetry, re-evaluation of patient's condition and review of old charts    ____________________________________________    CLINICAL IMPRESSION / [place] / ED COURSE  Pertinent labs & imaging results that were available during my care of the patient were reviewed by me and considered in my medical decision making (see chart for details).    patient presents with chest pain and EKG concerning for STEMI. Cardiology  at bedside at 5:15am for eval. Pt given 324 aspirin by EMS, 4000u heparin IV on arrival to ED bed 4. 1 nitroglycerin sublingual in ED for persistent chest pain (BP 150/110 at that time). suspect LAD occlusion. Doubt dissection or PE.      ----------------------------------------- 5:30 AM on 08/10/2017 ----------------------------------------- Patient departing ED for Cath Lab. Remains hemodynamically stable, awake, alert.    ____________________________________________   FINAL CLINICAL IMPRESSION(S) / ED DIAGNOSES    Final diagnoses:  ST elevation myocardial infarction involving left anterior descending (LAD) coronary artery (HCC)  Precordial pain     ED Discharge Orders    None      Portions of this note were generated with dragon dictation software. Dictation errors may occur despite best attempts at proofreading.    Carrie Mew, MD 08/10/17 0530

## 2017-08-10 NOTE — Consult Note (Signed)
Cardiology Consultation:   Patient ID: Henry [name]; [id number]; [birth year]   Admit date: 08/10/2017 Date of Consult: 08/10/2017  Primary Care Provider: Juluis Pitch, MD Primary Cardiologist: Paraschos Primary Electrophysiologist:     Patient Profile:   Henry [name] is a 72 y.o. male with a hx of COPD, tobacco abuse, HTN, HLD, DM  who is being seen today for the evaluation of chest pain/acute MI.   History of Present Illness:   Henry [name] is a 72 yo male with a history of DM, tobacco abuse, COPD, HTN and HLD who arrives at the ED via EMS tonight with c/o chest pain. He began having chest pain at 3:30 am. EKG per EMS with ST elevation in the anterior leads. Code STEMI called by ED staff. Pt with c/o chest pain that is coming and going. Center of chest. No dyspnea. No dizziness. Pt smokes 3-4 cigarettes per day for many years. He was given ASA, 4000 units IV heparin and SL NTG in the ED.   Past Medical History:  Diagnosis Date  . Arthritis   . Cancer ([place])    skin  . Chronic kidney disease   . Diabetes mellitus without complication ([place])   . Glaucoma   . Hyperlipidemia   . Hypertension     Past Surgical History:  Procedure Laterality Date  . COLONOSCOPY WITH PROPOFOL N/A 12/08/2014   Procedure: COLONOSCOPY WITH PROPOFOL;  Surgeon: Manya Silvas, MD;  Location: [facility] ENDOSCOPY;  Service: Endoscopy;  Laterality: N/A;  . COLONOSCOPY WITH PROPOFOL N/A 01/10/2016   Procedure: COLONOSCOPY WITH PROPOFOL;  Surgeon: Manya Silvas, MD;  Location: [facility] ENDOSCOPY;  Service: Endoscopy;  Laterality: N/A;  . EXTRACORPOREAL SHOCK WAVE LITHOTRIPSY Right 11/09/2015   Procedure: EXTRACORPOREAL SHOCK WAVE LITHOTRIPSY (ESWL);  Surgeon: Royston Cowper, MD;  Location: [facility] ORS;  Service: Urology;  Laterality: Right;  . EXTRACORPOREAL SHOCK WAVE LITHOTRIPSY Left 10/17/2016   Procedure: EXTRACORPOREAL SHOCK WAVE LITHOTRIPSY (ESWL);  Surgeon: Royston Cowper, MD;  Location: [facility] ORS;   Service: Urology;  Laterality: Left;  . LITHOTRIPSY     x 6  . PAROTIDECTOMY  2015  . TONSILLECTOMY       Home Meds:  Medication Sig Dispense Refill  . albuterol (VENTOLIN HFA) 90 mcg/actuation inhaler Inhale 2 inhalations into the lungs every 4 (four) hours as needed for Wheezing. 18 g 1  . aspirin 81 MG EC tablet Take 81 mg by mouth once daily.  . cetirizine (ZYRTEC) 10 MG tablet Take 1 tablet (10 mg total) by mouth once daily. 30 tablet 3  . fluticasone (FLONASE) 50 mcg/actuation nasal spray Place 1 spray into both nostrils 2 (two) times daily.  Marland Kitchen HYDROcodone-acetaminophen (NORCO) 5-325 mg tablet Take 1 tablet by mouth 3 (three) times daily as needed for Pain. Reported on 05/01/2015 30 tablet 0  . insulin DETEMIR (LEVEMIR) 100 unit/mL injection Inject subcutaneously nightly. 90u QAM & 99 QPM  . JANUMET 50-1,000 mg tablet TAKE ONE TABLET BY MOUTH TWICE A DAY WITH MEALS 60 tablet 3  . meloxicam (MOBIC) 15 MG tablet Take 15 mg by mouth once daily  . montelukast (SINGULAIR) 10 mg tablet Take 1 tablet (10 mg total) by mouth nightly 30 tablet 3  . multivitamin tablet Take 1 tablet by mouth once daily.  Marland Kitchen oxyCODONE-acetaminophen (PERCOCET) 10-325 mg tablet  . simvastatin (ZOCOR) 40 MG tablet Take 40 mg by mouth nightly.  . tamsulosin (FLOMAX) 0.4 mg capsule  . valsartan (DIOVAN) 320 MG tablet Take 320  mg by mouth once daily.  . blood glucose diagnostic test strip Use 4 (four) times daily. 200 each 12     Inpatient Medications: Scheduled Meds: . [MAR Hold] aspirin  324 mg Oral Once   Continuous Infusions: . sodium chloride 10 mL/hr (08/10/17 0516)   PRN Meds:   Allergies:   No Known Allergies  Social History:   Social History   Socioeconomic History  . Marital status: Single    Spouse name: Not on file  . Number of children: Not on file  . Years of education: Not on file  . Highest education level: Not on file  Occupational History  . Not on file  Social Needs  .  Financial resource strain: Not on file  . Food insecurity:    Worry: Not on file    Inability: Not on file  . Transportation needs:    Medical: Not on file    Non-medical: Not on file  Tobacco Use  . Smoking status: Current Every Day Smoker    Packs/day: 0.10    Types: Cigarettes  . Smokeless tobacco: Never Used  Substance and Sexual Activity  . Alcohol use: No  . Drug use: No  . Sexual activity: Not on file  Lifestyle  . Physical activity:    Days per week: Not on file    Minutes per session: Not on file  . Stress: Not on file  Relationships  . Social connections:    Talks on phone: Not on file    Gets together: Not on file    Attends religious service: Not on file    Active member of club or organization: Not on file    Attends meetings of clubs or organizations: Not on file    Relationship status: Not on file  . Intimate partner violence:    Fear of current or ex partner: Not on file    Emotionally abused: Not on file    Physically abused: Not on file    Forced sexual activity: Not on file  Other Topics Concern  . Not on file  Social History Narrative  . Not on file    Family History:    Family History  Problem Relation Age of Onset  . Heart attack Mother   . Melanoma Father   . Parkinsonism Father      ROS:  Please see the history of present illness.  All other ROS reviewed and negative.     Physical Exam/Data:   Vitals:   08/10/17 0507 08/10/17 0512 08/10/17 0520  BP:  (!) 148/71 133/76  Pulse:  89   Resp:  (!) 25 (!) 21  Temp:  98 F (36.7 C)   TempSrc:  Oral   SpO2:  95%   Weight: 215 lb (97.5 kg)    Height: 5\' 11"  (1.803 m)     No intake or output data in the 24 hours ending 08/10/17 0535 Filed Weights   08/10/17 0507  Weight: 215 lb (97.5 kg)   Body mass index is 29.99 kg/m.  General:  Well nourished, well developed, in acute distress HEENT: normal Lymph: no adenopathy Neck: no JVD Endocrine:  No thryomegaly Vascular: No carotid  bruits; FA pulses 2+ bilaterally without bruits  Cardiac:  normal S1, S2; RRR; no murmur  Lungs:  clear to auscultation bilaterally, no wheezing, rhonchi or rales  Abd: soft, nontender, no hepatomegaly  Ext: no edema Musculoskeletal:  No deformities, BUE and BLE strength normal and equal Skin: warm and  dry  Neuro:  CNs 2-12 intact, no focal abnormalities noted Psych:  Normal affect   EKG:  The EKG was personally reviewed and demonstrates:  Sinus, ST elevation leads V2-V4 Telemetry:  Telemetry was personally reviewed and demonstrates:    Laboratory Data:  ChemistryNo results for input(s): NA, K, CL, CO2, GLUCOSE, BUN, CREATININE, CALCIUM, GFRNONAA, GFRAA, ANIONGAP in the last 168 hours.  No results for input(s): PROT, ALBUMIN, AST, ALT, ALKPHOS, BILITOT in the last 168 hours. Hematology Recent Labs  Lab 08/10/17 0507  WBC 10.7*  RBC 5.27  HGB 16.5  HCT 46.6  MCV 88.6  MCH 31.3  MCHC 35.3  RDW 14.0  PLT 253   Cardiac EnzymesNo results for input(s): TROPONINI in the last 168 hours. No results for input(s): TROPIPOC in the last 168 hours.  BNPNo results for input(s): BNP, PROBNP in the last 168 hours.  DDimer No results for input(s): DDIMER in the last 168 hours.  Radiology/Studies:  No results found.  Assessment and Plan:   1. Acute anterior STEMI: Pt will be taken for emergent cardiac cath with probable PCI. Further plans to follow after cath.    For questions or updates, please contact [facility] Please consult [email or web address] for contact info under Cardiology/STEMI.   Signed, Lauree Chandler, MD  08/10/2017 5:35 AM

## 2017-08-10 NOTE — Consult Note (Addendum)
Name: Henry [name] MRN: [id number] DOB: 08/16/1945     CONSULTATION DATE: 08/10/2017   HISTORY OF PRESENT ILLNESS:  72 years old man with history of DM, hypertension, CKD, dyslipidemia and chronic tobacco abuse.  Patient is admitted with anterior STEMI, underwent coronary angiogram with PCI with 2 drug-eluting stents to LAD.  All history was obtained from nursing, the patient and EMR.  Patient to rest ice unit post left heart caths awake, no chest pain, no distress on nasal cannula. PAST MEDICAL HISTORY :   has a past medical history of Arthritis, Cancer ([place]), Chronic kidney disease, Diabetes mellitus without complication ([place]), Glaucoma, Hyperlipidemia, and Hypertension.  has a past surgical history that includes Lithotripsy; Colonoscopy with propofol (N/A, 12/08/2014); Parotidectomy (2015); Extracorporeal shock wave lithotripsy (Right, 11/09/2015); Tonsillectomy; Colonoscopy with propofol (N/A, 01/10/2016); and Extracorporeal shock wave lithotripsy (Left, 10/17/2016). Prior to Admission medications   Medication Sig Start Date End Date Taking? Authorizing Provider  acetaminophen (TYLENOL) 325 MG tablet Take 2 tablets (650 mg total) by mouth every 6 (six) hours as needed for mild pain (or Fever >/= 101). 01/27/16   Gouru, Illene Silver, MD  albuterol (PROVENTIL HFA;VENTOLIN HFA) 108 (90 Base) MCG/ACT inhaler Inhale 2 puffs into the lungs every 6 (six) hours as needed for wheezing or shortness of breath. 01/27/16   Gouru, Illene Silver, MD  fluticasone (FLONASE) 50 MCG/ACT nasal spray Place 1 spray into both nostrils daily.    [provider]  guaiFENesin-dextromethorphan (ROBITUSSIN DM) 100-10 MG/5ML syrup Take 10 mLs by mouth every 6 (six) hours as needed for cough. Patient not taking: Reported on 10/17/2016 01/27/16   Nicholes Mango, MD  HYDROcodone-acetaminophen (NORCO) 10-325 MG tablet Take 1-2 tablets by mouth every 4 (four) hours as needed for moderate pain. Maximum dose per 24 hours -8 pills.  11/09/15   Royston Cowper, MD  HYDROcodone-acetaminophen (NORCO) 10-325 MG tablet Take 1-2 tablets by mouth every 4 (four) hours as needed for moderate pain. Maximum dose per 24 hours -8 pills. 10/17/16   Royston Cowper, MD  insulin detemir (LEVEMIR) 100 UNIT/ML injection Inject 0.4 mLs (40 Units total) into the skin every morning. 01/27/16   Gouru, Illene Silver, MD  insulin detemir (LEVEMIR) 100 UNIT/ML injection Inject 0.35 mLs (35 Units total) into the skin at bedtime. 01/27/16   Nicholes Mango, MD  levofloxacin (LEVAQUIN) 500 MG tablet Take 1 tablet (500 mg total) by mouth daily. 10/17/16   Royston Cowper, MD  multivitamin-iron-minerals-folic acid (CENTRUM) chewable tablet Chew 1 tablet by mouth daily.    [provider]  ondansetron (ZOFRAN ODT) 8 MG disintegrating tablet Take 1 tablet (8 mg total) by mouth every 6 (six) hours as needed for nausea or vomiting. 10/17/16   Royston Cowper, MD  simvastatin (ZOCOR) 40 MG tablet Take 40 mg by mouth daily.    [provider]  valsartan (DIOVAN) 320 MG tablet Take 320 mg by mouth daily.    [provider]   No Known Allergies  FAMILY HISTORY:  family history includes Heart attack in his mother; Melanoma in his father; Parkinsonism in his father. SOCIAL HISTORY:  reports that he has been smoking cigarettes.  He has been smoking about 0.10 packs per day. He has never used smokeless tobacco. He reports that he does not drink alcohol or use drugs.  REVIEW OF SYSTEMS:   Unable to obtain due to critical illness   VITAL SIGNS: Temp:  [97.9 F (36.6 C)-98 F (36.7 C)] 97.9 F (36.6 C) (  05/05 0713) Pulse Rate:  [69-89] 78 (05/05 1600) Resp:  [15-25] 18 (05/05 1600) BP: (124-163)/(70-93) 163/93 (05/05 1600) SpO2:  [92 %-98 %] 97 % (05/05 1617) FiO2 (%):  [28 %] 28 % (05/05 1617) Weight:  [97.5 kg (215 lb)-101 kg (222 lb 10.6 oz)] 101 kg (222 lb 10.6 oz) (05/05 9381)  Physical Examination:  Awake and oriented with no acute  neurological deficits On nasal cannula no distress, able to talk in full sentences, bilateral equal air entry and no rales S1 & S2 audible with no murmur Benign abdomen with normal peristalsis No leg edema, right radial artery site of arterial cannulation and no hematoma  ASSESSMENT / PLAN:  Anterior STEMI status post PCI with 2 drug-eluting stent to LAD. -Dual antiplatelet with Brilinta and aspirin + statin+ beta-blocker  DM A1c 6.7 -Glycemic control  CKD stage III and prerenal azotemia with volume depletion. -Optimize hydration and monitor renal panel  HTN -Optimize antihypertensives and monitor hemodynamics  Full code  Supportive care  Critical care time 40 minutes   17:00  C/O chest pain resolved with NTG SL and no EKG changes Dr. Ubaldo Glassing was notified and advised monitoring of hemodynamics and CI Patient is currently chest pain free

## 2017-08-10 NOTE — Progress Notes (Signed)
Afte 1 SL NTG tablet patient reports 0/10 chest pain.  Denies SOB but reports mild headache.  MD aware.

## 2017-08-10 NOTE — H&P (Signed)
Henry [name] NAME: Henry [name]    MR#:  [id number]  DATE OF BIRTH:  04-29-1945  DATE OF ADMISSION:  08/10/2017  PRIMARY CARE PHYSICIAN: Juluis Pitch, MD   REQUESTING/REFERRING PHYSICIAN: Dr. Carrie Mew  CHIEF COMPLAINT:   Chief Complaint  Patient presents with  . Code STEMI    HISTORY OF PRESENT ILLNESS:  Henry [name]  is a 72 y.o. male with a known history of insulin-dependent diabetes mellitus, hypertension, CKD, hyperlipidemia, ongoing smoking presents to hospital secondary to worsening chest pain that started early this morning. Patient is seen in the ICU after his cardiac catheterization.  No known cardiac history, but ongoing smoking and has other risk factors.  He states that he is feeling congested for the last couple of days with shortness of breath.  He woke up in the middle of the night with significant chest pain, heaviness and discomfort.  EKG by EMS showed ST segment elevation myocardial infarction.  He was taken to the cardiac catheterization lab and had stents placed in LAD.  Chest pain is much improved.  But still feels congested and difficulty breathing.  Denies any recent illnesses, no nausea or vomiting.  No fevers or chills.  PAST MEDICAL HISTORY:   Past Medical History:  Diagnosis Date  . Arthritis   . Cancer ([place])    skin  . Chronic kidney disease   . Diabetes mellitus without complication ([place])   . Glaucoma   . Hyperlipidemia   . Hypertension     PAST SURGICAL HISTORY:   Past Surgical History:  Procedure Laterality Date  . COLONOSCOPY WITH PROPOFOL N/A 12/08/2014   Procedure: COLONOSCOPY WITH PROPOFOL;  Surgeon: Manya Silvas, MD;  Location[facility] ENDOSCOPY;  Service: Endoscopy;  Laterality: N/A;  . COLONOSCOPY WITH PROPOFOL N/A 01/10/2016   Procedure: COLONOSCOPY WITH PROPOFOL;  Surgeon: Manya Silvas, MD;  Location: [facility] ENDOSCOPY;  Service: Endoscopy;  Laterality: N/A;  .  EXTRACORPOREAL SHOCK WAVE LITHOTRIPSY Right 11/09/2015   Procedure: EXTRACORPOREAL SHOCK WAVE LITHOTRIPSY (ESWL);  Surgeon: Royston Cowper, MD;  Location: [facility] ORS;  Service: Urology;  Laterality: Right;  . EXTRACORPOREAL SHOCK WAVE LITHOTRIPSY Left 10/17/2016   Procedure: EXTRACORPOREAL SHOCK WAVE LITHOTRIPSY (ESWL);  Surgeon: Royston Cowper, MD;  Location: [facility] ORS;  Service: Urology;  Laterality: Left;  . LITHOTRIPSY     x 6  . PAROTIDECTOMY  2015  . TONSILLECTOMY      SOCIAL HISTORY:   Social History   Tobacco Use  . Smoking status: Current Every Day Smoker    Packs/day: 0.10    Types: Cigarettes  . Smokeless tobacco: Never Used  Substance Use Topics  . Alcohol use: No    FAMILY HISTORY:   Family History  Problem Relation Age of Onset  . Heart attack Mother   . Melanoma Father   . Parkinsonism Father     DRUG ALLERGIES:  No Known Allergies  REVIEW OF SYSTEMS:   Review of Systems  Constitutional: Negative for chills, fever, malaise/fatigue and weight loss.  HENT: Positive for congestion. Negative for ear discharge, ear pain, hearing loss, nosebleeds and tinnitus.   Eyes: Negative for blurred vision, double vision and photophobia.  Respiratory: Positive for cough and shortness of breath. Negative for hemoptysis and wheezing.   Cardiovascular: Positive for chest pain. Negative for palpitations, orthopnea and leg swelling.  Gastrointestinal: Negative for abdominal pain, constipation, diarrhea, heartburn, melena, nausea and vomiting.  Genitourinary: Negative for dysuria,  frequency, hematuria and urgency.  Musculoskeletal: Negative for back pain, myalgias and neck pain.  Skin: Negative for rash.  Neurological: Negative for dizziness, tingling, tremors, sensory change, speech change, focal weakness and headaches.  Endo/Heme/Allergies: Does not bruise/bleed easily.  Psychiatric/Behavioral: Negative for depression.    MEDICATIONS AT HOME:   Prior to Admission  medications   Medication Sig Start Date End Date Taking? Authorizing Provider  acetaminophen (TYLENOL) 325 MG tablet Take 2 tablets (650 mg total) by mouth every 6 (six) hours as needed for mild pain (or Fever >/= 101). 01/27/16   Gouru, Illene Silver, MD  albuterol (PROVENTIL HFA;VENTOLIN HFA) 108 (90 Base) MCG/ACT inhaler Inhale 2 puffs into the lungs every 6 (six) hours as needed for wheezing or shortness of breath. 01/27/16   Gouru, Illene Silver, MD  fluticasone (FLONASE) 50 MCG/ACT nasal spray Place 1 spray into both nostrils daily.    [provider]  guaiFENesin-dextromethorphan (ROBITUSSIN DM) 100-10 MG/5ML syrup Take 10 mLs by mouth every 6 (six) hours as needed for cough. Patient not taking: Reported on 10/17/2016 01/27/16   Nicholes Mango, MD  HYDROcodone-acetaminophen (NORCO) 10-325 MG tablet Take 1-2 tablets by mouth every 4 (four) hours as needed for moderate pain. Maximum dose per 24 hours -8 pills. 11/09/15   Royston Cowper, MD  HYDROcodone-acetaminophen (NORCO) 10-325 MG tablet Take 1-2 tablets by mouth every 4 (four) hours as needed for moderate pain. Maximum dose per 24 hours -8 pills. 10/17/16   Royston Cowper, MD  insulin detemir (LEVEMIR) 100 UNIT/ML injection Inject 0.4 mLs (40 Units total) into the skin every morning. 01/27/16   Gouru, Illene Silver, MD  insulin detemir (LEVEMIR) 100 UNIT/ML injection Inject 0.35 mLs (35 Units total) into the skin at bedtime. 01/27/16   Nicholes Mango, MD  levofloxacin (LEVAQUIN) 500 MG tablet Take 1 tablet (500 mg total) by mouth daily. 10/17/16   Royston Cowper, MD  multivitamin-iron-minerals-folic acid (CENTRUM) chewable tablet Chew 1 tablet by mouth daily.    [provider]  ondansetron (ZOFRAN ODT) 8 MG disintegrating tablet Take 1 tablet (8 mg total) by mouth every 6 (six) hours as needed for nausea or vomiting. 10/17/16   Royston Cowper, MD  simvastatin (ZOCOR) 40 MG tablet Take 40 mg by mouth daily.    [provider]  valsartan  (DIOVAN) 320 MG tablet Take 320 mg by mouth daily.    [provider]      VITAL SIGNS:  Blood pressure (!) 146/83, pulse 70, temperature 97.9 F (36.6 C), temperature source Oral, resp. rate 15, height 5\' 11"  (1.803 m), weight 101 kg (222 lb 10.6 oz), SpO2 95 %.  PHYSICAL EXAMINATION:   Physical Exam  GENERAL:  72 y.o.-year-old patient lying in the bed with no acute distress.  EYES: Pupils equal, round, reactive to light and accommodation. No scleral icterus. Extraocular muscles intact.  HEENT: Head atraumatic, normocephalic. Oropharynx and nasopharynx clear.  NECK:  Supple, no jugular venous distention. No thyroid enlargement, no tenderness.  LUNGS: Normal breath sounds bilaterally, no wheezing, rales,rhonchi or crepitation. No use of accessory muscles of respiration.  Decreased bibasilar breath sounds CARDIOVASCULAR: S1, S2 normal. No murmurs, rubs, or gallops.  ABDOMEN: Soft, nontender, nondistended. Bowel sounds present. No organomegaly or mass.  EXTREMITIES: No pedal edema, cyanosis, or clubbing.  Right radial catheterization site appears normal NEUROLOGIC: Cranial nerves II through XII are intact. Muscle strength 5/5 in all extremities. Sensation intact. Gait not checked.  PSYCHIATRIC: The patient is alert and oriented x  3.  SKIN: No obvious rash, lesion, or ulcer.   LABORATORY PANEL:   CBC Recent Labs  Lab 08/10/17 0507  WBC 10.7*  HGB 16.5  HCT 46.6  PLT 253   ------------------------------------------------------------------------------------------------------------------  Chemistries  Recent Labs  Lab 08/10/17 0507  NA 137  K 4.3  CL 100*  CO2 31  GLUCOSE 155*  BUN 25*  CREATININE 1.15  CALCIUM 10.0  AST 30  ALT 27  ALKPHOS 61  BILITOT 0.5   ------------------------------------------------------------------------------------------------------------------  Cardiac Enzymes Recent Labs  Lab 08/10/17 1054  TROPONINI 0.52*    ------------------------------------------------------------------------------------------------------------------  RADIOLOGY:  No results found.  EKG:   Orders placed or performed during the hospital encounter of 08/10/17  . EKG 12-Lead  . EKG 12-Lead  . EKG 12-Lead immediately post procedure  . EKG 12-Lead  . EKG 12-Lead immediately post procedure    IMPRESSION AND PLAN:   Adolf Ormiston  is a 72 y.o. male with a known history of insulin-dependent diabetes mellitus, hypertension, CKD, hyperlipidemia, ongoing smoking presents to hospital secondary to worsening chest pain that started early this morning  1.  Acute anterior ST segment elevation MI-secondary to subtotal occlusion of LAD -Appreciate cardiology consult.  Patient was taken to cardiac catheterization lab and had 2 drug-eluting stents placed to mid LAD. -Also has RCA calcification and might need work-up as outpatient for the same. -Echocardiogram is pending.  Remains in ICU -Started on aspirin, Brilinta, statin, metoprolol and Avapro. -Continue nitroglycerin as needed  2.  Diabetes mellitus-restart Levemir and also on sliding scale insulin.  3.  Nasal congestion-started on Dulera, Claritin for now.  No indication for steroids yet.  4.  Tobacco use disorder-strongly counseled, started on nicotine patch  5.  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  Monitor  6.  DVT prophylaxis-we will start Lovenox    All the records are reviewed and case discussed with ED provider. Management plans discussed with the patient, family and they are in agreement.  CODE STATUS: Full code  TOTAL TIME TAKING CARE OF THIS PATIENT: 50 minutes.    Lizanne Erker M.D on 08/10/2017 at 1:41 PM  Between 7am to 6pm - Pager - [phone]  After 6pm go to [email or web address] - password [email or web address] [place] [facility]  Office  [phone]  CC: Primary care physician; Juluis Pitch, MD

## 2017-08-10 NOTE — ED Notes (Signed)
Pt requests staff call friends at 0700:  [place] [phone] [place] [phone]

## 2017-08-10 NOTE — Progress Notes (Signed)
Pt reports mild chest pain 4/10.  New EKG ordered,collected and given to Dr. Soyla Murphy.  1 NTG SL given to patient.

## 2017-08-10 NOTE — ED Triage Notes (Signed)
Pt arrives via [facility] with c/o STEMI. EMS reports being called out at 0330 CP which resolved temporarily. Per EMS, CP returned [phone] and EMS read STEMI on EKG. Pt was given 324 aspirin and 1 spray SL nitro by EMS. Pt is alert and oriented at this time.

## 2017-08-10 NOTE — Progress Notes (Signed)
*  PRELIMINARY RESULTS* Echocardiogram 2D Echocardiogram has been performed. Definity IV Contrast used on this study.  Henry [name] [facility] [place] 08/10/2017, 11:42 AM

## 2017-08-10 NOTE — Progress Notes (Signed)
Henry [name] responded to code STEMI page. Henry [name] arrived and staff were in room with Henry [name]. Henry [name] was talking with staff and no family was present at this time. CH provided prayer outside room. Nambe available for follow-up. Dierks headed to another Henry [name].

## 2017-08-11 ENCOUNTER — Encounter: Payer: Self-pay | Admitting: Cardiovascular Disease

## 2017-08-11 LAB — CBC
HEMATOCRIT: 48.8 % (ref 40.0–52.0)
HEMOGLOBIN: 16.8 g/dL (ref 13.0–18.0)
MCH: 30.5 pg (ref 26.0–34.0)
MCHC: 34.4 g/dL (ref 32.0–36.0)
MCV: 88.7 fL (ref 80.0–100.0)
Platelets: 271 10*3/uL (ref 150–440)
RBC: 5.51 MIL/uL (ref 4.40–5.90)
RDW: 14.2 % (ref 11.5–14.5)
WBC: 13.3 10*3/uL — AB (ref 3.8–10.6)

## 2017-08-11 LAB — PHOSPHORUS: Phosphorus: 2.3 mg/dL — ABNORMAL LOW (ref 2.5–4.6)

## 2017-08-11 LAB — ECHOCARDIOGRAM COMPLETE
HEIGHTINCHES: 71 in
Weight: 3562.63 oz

## 2017-08-11 LAB — BASIC METABOLIC PANEL
Anion gap: 9 (ref 5–15)
BUN: 19 mg/dL (ref 6–20)
CO2: 26 mmol/L (ref 22–32)
Calcium: 9.3 mg/dL (ref 8.9–10.3)
Chloride: 102 mmol/L (ref 101–111)
Creatinine, Ser: 0.99 mg/dL (ref 0.61–1.24)
GFR calc non Af Amer: >60 mL/min (ref >60)
Glucose, Bld: 151 mg/dL — ABNORMAL HIGH (ref 65–99)
POTASSIUM: 3.8 mmol/L (ref 3.5–5.1)
SODIUM: 137 mmol/L (ref 135–145)

## 2017-08-11 LAB — HEPARIN LEVEL (UNFRACTIONATED)
HEPARIN UNFRACTIONATED: 0.25 [IU]/mL — AB (ref 0.30–0.70)
Heparin Unfractionated: 0.31 IU/mL (ref 0.30–0.70)

## 2017-08-11 LAB — GLUCOSE, CAPILLARY
GLUCOSE-CAPILLARY: 214 mg/dL — AB (ref 65–99)
GLUCOSE-CAPILLARY: 155 mg/dL — AB (ref 65–99)
GLUCOSE-CAPILLARY: 144 mg/dL — AB (ref 65–99)
Glucose-Capillary: 139 mg/dL — ABNORMAL HIGH (ref 65–99)

## 2017-08-11 LAB — MAGNESIUM: Magnesium: 2.3 mg/dL (ref 1.7–2.4)

## 2017-08-11 MED ORDER — HEPARIN (PORCINE) IN NACL 100-0.45 UNIT/ML-% IJ SOLN
1650.0000 [IU]/h | INTRAMUSCULAR | Status: DC
  Administered 2017-08-11: 1300 [IU]/h via INTRAVENOUS
  Administered 2017-08-12: 1500 [IU]/h via INTRAVENOUS
  Administered 2017-08-12: 1650 [IU]/h via INTRAVENOUS
  Filled 2017-08-11 (×4): qty 250

## 2017-08-11 MED ORDER — ZOLPIDEM TARTRATE 5 MG PO TABS
5.0000 mg | ORAL_TABLET | Freq: Every evening | ORAL | Status: DC | PRN
  Administered 2017-08-11: 5 mg via ORAL
  Filled 2017-08-11: qty 1

## 2017-08-11 MED ORDER — HEPARIN BOLUS VIA INFUSION
1500.0000 [IU] | Freq: Once | INTRAVENOUS | Status: AC
  Administered 2017-08-11: 1500 [IU] via INTRAVENOUS
  Filled 2017-08-11: qty 1500

## 2017-08-11 MED ORDER — HEPARIN BOLUS VIA INFUSION
5000.0000 [IU] | Freq: Once | INTRAVENOUS | Status: AC
  Administered 2017-08-11: 5000 [IU] via INTRAVENOUS
  Filled 2017-08-11: qty 5000

## 2017-08-11 NOTE — Progress Notes (Signed)
[facility] [place] NAME: Henry [name]    MR#:  [id number]  DATE OF BIRTH:  1945/08/05  SUBJECTIVE:  CHIEF COMPLAINT:   Chief Complaint  Patient presents with  . Code STEMI   -Feels much better today, no more chest pain.  Was started on heparin drip due to arrhythmia last night-sinus arrhythmia this morning. -Sitting in a chair  REVIEW OF SYSTEMS:  Review of Systems  Constitutional: Negative for chills, fever and malaise/fatigue.  HENT: Negative for congestion, ear discharge, hearing loss and nosebleeds.   Eyes: Negative for blurred vision and double vision.  Respiratory: Positive for shortness of breath. Negative for cough and wheezing.   Cardiovascular: Negative for chest pain and palpitations.  Gastrointestinal: Negative for abdominal pain, constipation, diarrhea, nausea and vomiting.  Genitourinary: Negative for dysuria.  Musculoskeletal: Negative for myalgias.  Neurological: Negative for dizziness, focal weakness, seizures, weakness and headaches.  Psychiatric/Behavioral: Negative for depression.    DRUG ALLERGIES:  No Known Allergies  VITALS:  Blood pressure 123/74, pulse 77, temperature 97.9 F (36.6 C), temperature source Oral, resp. rate (!) 21, height 5\' 11"  (1.803 m), weight 101 kg (222 lb 10.6 oz), SpO2 90 %.  PHYSICAL EXAMINATION:  Physical Exam  GENERAL:  72 y.o.-year-old patient lying in the bed with no acute distress.  EYES: Pupils equal, round, reactive to light and accommodation. No scleral icterus. Extraocular muscles intact.  HEENT: Head atraumatic, normocephalic. Oropharynx and nasopharynx clear.  NECK:  Supple, no jugular venous distention. No thyroid enlargement, no tenderness.  LUNGS: Normal breath sounds bilaterally, no wheezing, rales,rhonchi or crepitation. No use of accessory muscles of respiration.  Decreased bibasilar breath sounds CARDIOVASCULAR: S1, S2 normal. No murmurs, rubs, or gallops.    ABDOMEN: Soft, nontender, nondistended. Bowel sounds present. No organomegaly or mass.  EXTREMITIES: No pedal edema, cyanosis, or clubbing.  Right radial catheterization site appears normal NEUROLOGIC: Cranial nerves II through XII are intact. Muscle strength 5/5 in all extremities. Sensation intact. Gait not checked.  PSYCHIATRIC: The patient is alert and oriented x 3.  SKIN: No obvious rash, lesion, or ulcer.     LABORATORY PANEL:   CBC Recent Labs  Lab 08/11/17 0527  WBC 13.3*  HGB 16.8  HCT 48.8  PLT 271   ------------------------------------------------------------------------------------------------------------------  Chemistries  Recent Labs  Lab 08/10/17 0507 08/11/17 0527  NA 137 137  K 4.3 3.8  CL 100* 102  CO2 31 26  GLUCOSE 155* 151*  BUN 25* 19  CREATININE 1.15 0.99  CALCIUM 10.0 9.3  MG  --  2.3  AST 30  --   ALT 27  --   ALKPHOS 61  --   BILITOT 0.5  --    ------------------------------------------------------------------------------------------------------------------  Cardiac Enzymes Recent Labs  Lab 08/10/17 2311  TROPONINI 2.24*   ------------------------------------------------------------------------------------------------------------------  RADIOLOGY:  No results found.  EKG:   Orders placed or performed during the hospital encounter of 08/10/17  . EKG 12-Lead  . EKG 12-Lead  . EKG 12-Lead immediately post procedure  . EKG 12-Lead  . EKG 12-Lead immediately post procedure  . EKG 12-Lead  . EKG 12-Lead  . EKG 12-Lead  . EKG 12-Lead  . EKG 12-Lead    ASSESSMENT AND PLAN:   Roosvelt Churchwell  is a 72 y.o. male with a known history of insulin-dependent diabetes mellitus, hypertension, CKD, hyperlipidemia, ongoing smoking presents to hospital secondary to worsening chest pain that started early this morning  1.  Acute  anterior ST segment elevation MI-secondary to subtotal occlusion of LAD -Appreciate cardiology consult.   Patient was taken to cardiac catheterization lab and had 2 drug-eluting stents placed to mid LAD. -Also has RCA calcification and might need work-up as outpatient for the same. -Echocardiogram is pending.   -on aspirin, Brilinta, statin, metoprolol and Avapro. -Continue nitroglycerin as needed -In sinus rhythm now -Cardiac rehab after discharge  2.  Diabetes mellitus-on Levemir and also on sliding scale insulin.  3.  Nasal congestion-on Dulera, Claritin for now with improvement.  No indication for steroids yet.  4.  Tobacco use disorder-strongly counseled, on nicotine patch  5.  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  Monitor  6.  DVT prophylaxis- on heparin drip- only sinus arrhythmia   Being transferred to telemetry today    All the records are reviewed and case discussed with Care Management/Social Workerr. Management plans discussed with the patient, family and they are in agreement.  CODE STATUS: Full Code  TOTAL TIME TAKING CARE OF THIS PATIENT: 38 minutes.   POSSIBLE D/C IN 1-2 DAYS, DEPENDING ON CLINICAL CONDITION.   Gladstone Lighter M.D on 08/11/2017 at 10:14 AM  Between 7am to 6pm - Pager - [phone]  After 6pm go to [email or web address] - password [email or web address] [facility]  Office  [phone]  CC: Primary care physician; Juluis Pitch, MD

## 2017-08-11 NOTE — Progress Notes (Signed)
ANTICOAGULATION CONSULT NOTE   Pharmacy Consult for Heparin drip management Indication: STEMI  No Known Allergies  Patient Measurements: Height: 5\' 11"  (180.3 cm) Weight: 222 lb 10.6 oz (101 kg) IBW/kg (Calculated) : 75.3 Heparin Dosing Weight: 96  Vital Signs: Temp: 97.7 F (36.5 C) (05/06 2000) Temp Source: Oral (05/06 2000) BP: 118/94 (05/06 2000) Pulse Rate: 94 (05/06 2000)  Labs: Recent Labs    08/10/17 0507 08/10/17 1054 08/10/17 1703 08/10/17 2311 08/11/17 0527 08/11/17 1219 08/11/17 2157  HGB 16.5  --   --   --  16.8  --   --   HCT 46.6  --   --   --  48.8  --   --   PLT 253  --   --   --  271  --   --   APTT 28  --   --   --   --   --   --   LABPROT 12.2  --   --   --   --   --   --   INR 0.91  --   --   --   --   --   --   HEPARINUNFRC  --   --   --   --   --  0.25* 0.31  CREATININE 1.15  --   --   --  0.99  --   --   TROPONINI 0.03* 0.52* 1.27* 2.24*  --   --   --     Estimated Creatinine Clearance: 82.9 mL/min (by C-G formula based on SCr of 0.99 mg/dL).   Medical History: Past Medical History:  Diagnosis Date  . Arthritis   . Cancer ([place])    skin  . Chronic kidney disease   . Diabetes mellitus without complication ([place])   . Glaucoma   . Hyperlipidemia   . Hypertension     Medications:  Scheduled:  . aspirin  81 mg Oral Daily  . atorvastatin  80 mg Oral q1800  . fluticasone  1 spray Each Nare Daily  . fluticasone furoate-vilanterol  1 puff Inhalation Daily  . insulin aspart  0-5 Units Subcutaneous QHS  . insulin aspart  0-9 Units Subcutaneous TID WC  . insulin detemir  50 Units Subcutaneous BID  . irbesartan  300 mg Oral Daily  . loratadine  10 mg Oral Daily  . metoprolol tartrate  25 mg Oral BID  . nicotine  14 mg Transdermal Daily  . sodium chloride flush  3 mL Intravenous Q12H  . ticagrelor  90 mg Oral BID   Infusions:  . sodium chloride 10 mL/hr at 08/10/17 2300  . sodium chloride    . heparin 1,500 Units/hr (08/11/17 2000)     Assessment: Pharmacy consulted for heparin drip management for 37 YOM admitted with STEMI. Pt currently receiving heparin at 1500 unit/hr. Patient received bolus and rate increase at ~ 1300.   Goal of Therapy:  Heparin level 0.3-0.7 units/ml Monitor platelets by anticoagulation protocol: Yes   Plan:  Continue heparin at 1500 units/hr. Will recheck anti-Xa level with am labs.   Pharmacy will continue to monitor and adjust per consult.   Revan Gendron L 08/11/2017,10:45 PM

## 2017-08-11 NOTE — Progress Notes (Signed)
Patient Name: Henry [name] Date of Encounter: 08/11/2017  Hospital Problem List     Active Problems:   ST elevation myocardial infarction involving left anterior descending (LAD) coronary artery (HCC)   Acute ST elevation myocardial infarction (STEMI) involving left anterior descending (LAD) coronary artery [facility])    Patient Profile     72 year old male with history of murmur presented yesterday morning with acute chest pain.  Noted to have a anterior ST elevation myocardial infarction.  Underwent acute PCI per Dr. Angelena Form.  Cardiac cath revealed a 50% proximal RCA, 99% proximal RCA, 30% distal RCA, 30% mid to distal left circumflex, 70% first diagonal, 99% proximal to mid LAD, 70% LAD lesion after the 99% stenosis.  Underwent placement of a Sierra 2.5 x 28 mm stent LAD as well as a 2.5 x 12 mm stent.  EF was normal by cath.  Echo completed but very difficult images revealed what appeared to be preserved LV function normal wall motion was not able to be assessed for imaging even with contrast.  The RCA lesion was not treated.  The entire proximal and mid vessel was heavily calcified.  Will need consideration for staged PCI of the RCA later this week.  Will discuss with interventional team regarding best approach. Subjective   With shortness of breath but no chest pain.  Inpatient Medications    . aspirin  324 mg Oral Once  . aspirin  81 mg Oral Daily  . atorvastatin  80 mg Oral q1800  . fluticasone  1 spray Each Nare Daily  . fluticasone furoate-vilanterol  1 puff Inhalation Daily  . insulin aspart  0-5 Units Subcutaneous QHS  . insulin aspart  0-9 Units Subcutaneous TID WC  . insulin detemir  50 Units Subcutaneous BID  . irbesartan  300 mg Oral Daily  . loratadine  10 mg Oral Daily  . metoprolol tartrate  25 mg Oral BID  . nicotine  14 mg Transdermal Daily  . sodium chloride flush  3 mL Intravenous Q12H  . ticagrelor  90 mg Oral BID    Vital Signs    Vitals:   08/11/17  0400 08/11/17 0514 08/11/17 0525 08/11/17 0600  BP: 135/77 (!) 142/80  133/78  Pulse: 66 65 74 92  Resp: 15 14 20  (!) 24  Temp: 97.7 F (36.5 C)     TempSrc: Oral     SpO2: 93% 94% 96% 93%  Weight:      Height:        Intake/Output Summary (Last 24 hours) at 08/11/2017 0816 Last data filed at 08/11/2017 0400 Gross per 24 hour  Intake 1122.33 ml  Output 1060 ml  Net 62.33 ml   Filed Weights   08/10/17 0507 08/10/17 0713  Weight: 97.5 kg (215 lb) 101 kg (222 lb 10.6 oz)    Physical Exam    GEN: Well nourished, well developed, in no acute distress.  HEENT: normal.  Neck: Supple, no JVD, carotid bruits, or masses. Cardiac: RRR, no murmurs, rubs, or gallops. No clubbing, cyanosis, edema.  Radials/DP/PT 2+ and equal bilaterally.  Respiratory:  Respirations regular and unlabored, clear to auscultation bilaterally. GI: Soft, nontender, nondistended, BS + x 4. MS: no deformity or atrophy. Skin: warm and dry, no rash. Neuro:  Strength and sensation are intact. Psych: Normal affect.  Labs    CBC Recent Labs    08/10/17 0507 08/11/17 0527  WBC 10.7* 13.3*  NEUTROABS 6.1  --   HGB 16.5 16.8  HCT 46.6 48.8  MCV 88.6 88.7  PLT 253 443   Basic Metabolic Panel Recent Labs    08/10/17 0507 08/11/17 0527  NA 137 137  K 4.3 3.8  CL 100* 102  CO2 31 26  GLUCOSE 155* 151*  BUN 25* 19  CREATININE 1.15 0.99  CALCIUM 10.0 9.3  MG  --  2.3  PHOS  --  2.3*   Liver Function Tests Recent Labs    08/10/17 0507  AST 30  ALT 27  ALKPHOS 61  BILITOT 0.5  PROT 7.1  ALBUMIN 4.1   No results for input(s): LIPASE, AMYLASE in the last 72 hours. Cardiac Enzymes Recent Labs    08/10/17 1054 08/10/17 1703 08/10/17 2311  TROPONINI 0.52* 1.27* 2.24*   BNP No results for input(s): BNP in the last 72 hours. D-Dimer No results for input(s): DDIMER in the last 72 hours. Hemoglobin A1C Recent Labs    08/10/17 0507  HGBA1C 6.7*   Fasting Lipid Panel Recent Labs     08/10/17 0507  CHOL 132  HDL 35*  LDLCALC 65  TRIG 159*  CHOLHDL 3.8   Thyroid Function Tests No results for input(s): TSH, T4TOTAL, T3FREE, THYROIDAB in the last 72 hours.  Invalid input(s): FREET3  Telemetry    Sinus rhythm with intermittent atrial fibrillation.  ECG    Sinus rhythm with PACs  Radiology    No results found.  Assessment & Plan    72 year old male status post anterior STEMI.  Also has disease in his proximal RCA.  Doing well post stents x2 in the LAD.  Will transfer to telemetry consider staged PCI of the RCA later this week.  Need transfer for orbital atherectomy versus [place].  We will continue with dual antiplatelet therapy for 1 year.  Continue with high intensity statin, metoprolol.  EF greater than 40%.  Patient is on Avapro currently.  Will need phase 2 cardiac rehab.  Okay to transfer to telemetry.  Signed, Javier Docker Kiera Hussey MD 08/11/2017, 8:16 AM  Pager: [phone]

## 2017-08-11 NOTE — Progress Notes (Signed)
ANTICOAGULATION CONSULT NOTE - Initial Consult  Pharmacy Consult for heparin drip Indication: atrial fibrillation  No Known Allergies  Patient Measurements: Height: 5\' 11"  (180.3 cm) Weight: 222 lb 10.6 oz (101 kg) IBW/kg (Calculated) : 75.3 Heparin Dosing Weight: 96 kg  Vital Signs: Temp: 97.7 F (36.5 C) (05/06 0400) Temp Source: Oral (05/06 0400) BP: 142/80 (05/06 0514) Pulse Rate: 74 (05/06 0525)  Labs: Recent Labs    08/10/17 0507 08/10/17 1054 08/10/17 1703 08/10/17 2311 08/11/17 0527  HGB 16.5  --   --   --  16.8  HCT 46.6  --   --   --  48.8  PLT 253  --   --   --  271  APTT 28  --   --   --   --   LABPROT 12.2  --   --   --   --   INR 0.91  --   --   --   --   CREATININE 1.15  --   --   --   --   TROPONINI 0.03* 0.52* 1.27* 2.24*  --     Estimated Creatinine Clearance: 71.3 mL/min (by C-G formula based on SCr of 1.15 mg/dL).   Medical History: Past Medical History:  Diagnosis Date  . Arthritis   . Cancer ([place])    skin  . Chronic kidney disease   . Diabetes mellitus without complication ([place])   . Glaucoma   . Hyperlipidemia   . Hypertension     Medications:  No anticoagulation in PTA meds. Recent cath.  Assessment:  Goal of Therapy:  Heparin level 0.3-0.7 units/ml Monitor platelets by anticoagulation protocol: Yes   Plan:  5000 unit bolus and initial rate of 1300 units/hr. Dosing conservatively due to recent procedure.  First heparin level 6 hours after start of infusion.    Cayne Yom S 08/11/2017,6:05 AM

## 2017-08-11 NOTE — Progress Notes (Signed)
[facility] Progress Note Patient Name: Henry [name] DOB: Oct 09, 1945 MRN: [id number]   Date of Service  08/11/2017  HPI/Events of Note  AFIB with control ventricular rate - Cardiology requests Heparin IV infusion.   eICU Interventions  Will order Heparin IV infusion per pharmacy consultation.      Intervention Category Major Interventions: Arrhythmia - evaluation and management  Sommer,Steven Eugene 08/11/2017, 5:28 AM

## 2017-08-11 NOTE — Progress Notes (Signed)
ANTICOAGULATION CONSULT NOTE   Pharmacy Consult for Heparin drip management Indication: STEMI  No Known Allergies  Patient Measurements: Height: 5\' 11"  (180.3 cm) Weight: 222 lb 10.6 oz (101 kg) IBW/kg (Calculated) : 75.3 Heparin Dosing Weight: 96  Vital Signs: Temp: 97.5 F (36.4 C) (05/06 1200) Temp Source: Axillary (05/06 1200) BP: 124/87 (05/06 1200) Pulse Rate: 62 (05/06 1200)  Labs: Recent Labs    08/10/17 0507 08/10/17 1054 08/10/17 1703 08/10/17 2311 08/11/17 0527 08/11/17 1219  HGB 16.5  --   --   --  16.8  --   HCT 46.6  --   --   --  48.8  --   PLT 253  --   --   --  271  --   APTT 28  --   --   --   --   --   LABPROT 12.2  --   --   --   --   --   INR 0.91  --   --   --   --   --   HEPARINUNFRC  --   --   --   --   --  0.25*  CREATININE 1.15  --   --   --  0.99  --   TROPONINI 0.03* 0.52* 1.27* 2.24*  --   --     Estimated Creatinine Clearance: 82.9 mL/min (by C-G formula based on SCr of 0.99 mg/dL).   Medical History: Past Medical History:  Diagnosis Date  . Arthritis   . Cancer ([place])    skin  . Chronic kidney disease   . Diabetes mellitus without complication ([place])   . Glaucoma   . Hyperlipidemia   . Hypertension     Medications:  Scheduled:  . aspirin  81 mg Oral Daily  . atorvastatin  80 mg Oral q1800  . fluticasone  1 spray Each Nare Daily  . fluticasone furoate-vilanterol  1 puff Inhalation Daily  . heparin  1,500 Units Intravenous Once  . insulin aspart  0-5 Units Subcutaneous QHS  . insulin aspart  0-9 Units Subcutaneous TID WC  . insulin detemir  50 Units Subcutaneous BID  . irbesartan  300 mg Oral Daily  . loratadine  10 mg Oral Daily  . metoprolol tartrate  25 mg Oral BID  . nicotine  14 mg Transdermal Daily  . sodium chloride flush  3 mL Intravenous Q12H  . ticagrelor  90 mg Oral BID   Infusions:  . sodium chloride 10 mL/hr at 08/10/17 2300  . sodium chloride    . heparin 1,300 Units/hr (08/11/17 0700)     Assessment: Pharmacy consulted for heparin drip management for 60 YOM admitted with STEMI. Pt currently receiving heparin at 1300 unit/hr.   Goal of Therapy:  Heparin level 0.3-0.7 units/ml Monitor platelets by anticoagulation protocol: Yes   Plan:  Per heparin level 0.25, give heparin 1500 units bolus and increase rate to 1500 units/hr. Confirmed with nurse heparin drip was not interrupted. Will obtain follow-up at 2100. Pharmacy will continue to monitor/adjust per consult.  Levada Dy  Kaylob Wallen 08/11/2017,1:11 PM

## 2017-08-11 NOTE — Progress Notes (Signed)
Follow up - Critical Care Medicine Note  Patient Details:    Henry [name] is an 72 y.o. male, status post STEMI,multivessel disease, status post 2 stents in the LAD. Still has RCA disease, being considered for additional cardiac intervention. No complaints overnight and has had difficulty sleeping  Lines, Airways, Drains: Airway (Active)     Airway (Active)    Anti-infectives:  Anti-infectives (From admission, onward)   None      Microbiology: Results for orders placed or performed during the hospital encounter of 08/10/17  MRSA PCR Screening     Status: None   Collection Time: 08/10/17  7:16 AM  Result Value Ref Range Status   MRSA by PCR NEGATIVE NEGATIVE Final    Comment:        The GeneXpert MRSA Assay (FDA approved for NASAL specimens only), is one component of a comprehensive MRSA colonization surveillance program. It is not intended to diagnose MRSA infection nor to guide or monitor treatment for MRSA infections. Performed at [facility], [address], [place], [place] [address]     Consults: Treatment Team:  Cassandria Santee, MD Teodoro Spray, MD   Subjective:    Overnight no issues.   Objective:  Vital signs for last 24 hours: Temp:  [97.7 F (36.5 C)-98.3 F (36.8 C)] 97.9 F (36.6 C) (05/06 0826) Pulse Rate:  [60-95] 76 (05/06 0826) Resp:  [12-25] 19 (05/06 0826) BP: (126-163)/(68-93) 146/87 (05/06 0800) SpO2:  [92 %-98 %] 95 % (05/06 0826) FiO2 (%):  [28 %] 28 % (05/05 1617)  Hemodynamic parameters for last 24 hours:    Intake/Output from previous day: 05/05 0701 - 05/06 0700 In: 1210.3 [P.O.:360; I.V.:850.3] Out: 1060 [Urine:1060]  Intake/Output this shift: No intake/output data recorded.  Vent settings for last 24 hours: FiO2 (%):  [28 %] 28 %  Physical Exam:  Vital signs: Please see the above listed vital signs Cardiovascular: Regular rate and rhythm, frequent atrial ectopy noted on monitor Pulmonary: Clear to  auscultation Abdominal: Positive bowel sounds, soft exam Extremities: No clubbing cyanosis or edema noted Neurologic: No focal deficits noted  Assessment/Plan:   STEMI, status post PCI with 2 stents in the LAD. Will need dual antiplatelet agent for a year, high-dose statin therapy, metoprolol. At this point doing well, stable for telemetry transfer   [place] 08/11/2017  *Care during the described time interval was provided by me and/or other providers on the critical care team.  I have reviewed this patient's available data, including medical history, events of note, physical examination and test results as part of my evaluation.

## 2017-08-11 NOTE — Care Management (Addendum)
Brilinta voucher provided to patient. Patient has civilian MD however he also is followed at [facility].  I have sent H & P to [facility] with patient's permission.

## 2017-08-11 NOTE — Plan of Care (Signed)
Nutrition Education Note  RD consulted for nutrition education regarding a Heart Healthy and Carbohydrate Modified diet.   Lab Results  Component Value Date   HGBA1C 6.7 (H) 08/10/2017   Lipid Panel     Component Value Date/Time   CHOL 132 08/10/2017 0507   TRIG 159 (H) 08/10/2017 0507   HDL 35 (L) 08/10/2017 0507   CHOLHDL 3.8 08/10/2017 0507   VLDL 32 08/10/2017 0507   LDLCALC 65 08/10/2017 0507   Met with patient at bedside. He reports he has a good appetite now and that he also had a good appetite PTA. He is familiar with eating consistent carbohydrates throughout the day to manage his diabetes. He takes Levemir 40 units in the morning and 35 units at bedtime. He reports he is not on any mealtime coverage. He has not previously learned about a heart-healthy diet. He typically eats 3 meals per day. He has chicken, fish, or ground beef with vegetables and other sides. He limits sugar-sweetened beverages and other added sugars in his diet. Patient is weight-stable.   RD provided "Heart Healthy Nutrition Therapy" handout from the Academy of [employer]. Reviewed patient's dietary recall. Provided examples on ways to decrease sodium and fat intake in diet. Discouraged intake of processed foods and use of salt shaker. Encouraged fresh fruits and vegetables as well as whole grain sources of carbohydrates to maximize fiber intake. Discussed importance of controlled and consistent carbohydrate intake throughout the day. Provided examples of ways to balance meals/snacks and encouraged intake of high-fiber, whole grain complex carbohydrates. Teach back method used.  Expect fair to good compliance.  Body mass index is 31.06 kg/m. Pt meets criteria for Obesity Class I based on current BMI.  Current diet order is Carbohydrate Modified, patient is consuming approximately 75% of meals today per his report. Labs and medications reviewed. No further nutrition interventions warranted at this  time. RD contact information provided. If additional nutrition issues arise, please re-consult RD.  Willey Blade, MS, [place], LDN Office: [phone] Pager: [phone] After Hours/Weekend Pager: [phone]

## 2017-08-11 NOTE — Progress Notes (Signed)
Discussed case with Drs. Arida and Paraschos. Pt will need pci of rca prior to discharge. WIll remain on heparin and arrange transfer to [facility] for consideration for pci of proximal rca. Heavily calcified lesion that may require orbital atherectomy not available at [facility].

## 2017-08-12 ENCOUNTER — Encounter: Payer: Self-pay | Admitting: Physician Assistant

## 2017-08-12 ENCOUNTER — Other Ambulatory Visit: Payer: Self-pay

## 2017-08-12 ENCOUNTER — Inpatient Hospital Stay (HOSPITAL_COMMUNITY)
Admission: AD | Admit: 2017-08-12 | Discharge: 2017-08-14 | DRG: 247 | Disposition: A | Discharge status: Home / Self Care | Payer: Medicare HMO | Source: Other Acute Inpatient Hospital | Attending: Cardiovascular Disease | Admitting: Cardiovascular Disease

## 2017-08-12 DIAGNOSIS — I2102 ST elevation (STEMI) myocardial infarction involving left anterior descending coronary artery: Secondary | ICD-10-CM | POA: Diagnosis not present

## 2017-08-12 DIAGNOSIS — M1990 Unspecified osteoarthritis, unspecified site: Secondary | ICD-10-CM | POA: Diagnosis present

## 2017-08-12 DIAGNOSIS — E119 Type 2 diabetes mellitus without complications: Secondary | ICD-10-CM | POA: Diagnosis not present

## 2017-08-12 DIAGNOSIS — F17210 Nicotine dependence, cigarettes, uncomplicated: Secondary | ICD-10-CM | POA: Diagnosis present

## 2017-08-12 DIAGNOSIS — N182 Chronic kidney disease, stage 2 (mild): Secondary | ICD-10-CM | POA: Diagnosis present

## 2017-08-12 DIAGNOSIS — I493 Ventricular premature depolarization: Secondary | ICD-10-CM | POA: Diagnosis present

## 2017-08-12 DIAGNOSIS — Z95820 Peripheral vascular angioplasty status with implants and grafts: Secondary | ICD-10-CM

## 2017-08-12 DIAGNOSIS — I2510 Atherosclerotic heart disease of native coronary artery without angina pectoris: Secondary | ICD-10-CM | POA: Diagnosis present

## 2017-08-12 DIAGNOSIS — Z820 Family history of epilepsy and other diseases of the nervous system: Secondary | ICD-10-CM

## 2017-08-12 DIAGNOSIS — Z794 Long term (current) use of insulin: Secondary | ICD-10-CM | POA: Diagnosis not present

## 2017-08-12 DIAGNOSIS — Z79891 Long term (current) use of opiate analgesic: Secondary | ICD-10-CM | POA: Diagnosis not present

## 2017-08-12 DIAGNOSIS — Z8249 Family history of ischemic heart disease and other diseases of the circulatory system: Secondary | ICD-10-CM | POA: Diagnosis not present

## 2017-08-12 DIAGNOSIS — I129 Hypertensive chronic kidney disease with stage 1 through stage 4 chronic kidney disease, or unspecified chronic kidney disease: Secondary | ICD-10-CM | POA: Diagnosis present

## 2017-08-12 DIAGNOSIS — I2584 Coronary atherosclerosis due to calcified coronary lesion: Secondary | ICD-10-CM | POA: Diagnosis present

## 2017-08-12 DIAGNOSIS — Z85828 Personal history of other malignant neoplasm of skin: Secondary | ICD-10-CM | POA: Diagnosis not present

## 2017-08-12 DIAGNOSIS — D72829 Elevated white blood cell count, unspecified: Secondary | ICD-10-CM | POA: Diagnosis present

## 2017-08-12 DIAGNOSIS — I213 ST elevation (STEMI) myocardial infarction of unspecified site: Secondary | ICD-10-CM | POA: Diagnosis not present

## 2017-08-12 DIAGNOSIS — I214 Non-ST elevation (NSTEMI) myocardial infarction: Secondary | ICD-10-CM | POA: Insufficient documentation

## 2017-08-12 DIAGNOSIS — N189 Chronic kidney disease, unspecified: Secondary | ICD-10-CM | POA: Diagnosis not present

## 2017-08-12 DIAGNOSIS — IMO0001 Reserved for inherently not codable concepts without codable children: Secondary | ICD-10-CM

## 2017-08-12 DIAGNOSIS — H409 Unspecified glaucoma: Secondary | ICD-10-CM | POA: Diagnosis present

## 2017-08-12 DIAGNOSIS — E1122 Type 2 diabetes mellitus with diabetic chronic kidney disease: Secondary | ICD-10-CM | POA: Diagnosis present

## 2017-08-12 DIAGNOSIS — Z955 Presence of coronary angioplasty implant and graft: Secondary | ICD-10-CM

## 2017-08-12 DIAGNOSIS — Z79899 Other long term (current) drug therapy: Secondary | ICD-10-CM | POA: Diagnosis not present

## 2017-08-12 DIAGNOSIS — E785 Hyperlipidemia, unspecified: Secondary | ICD-10-CM | POA: Diagnosis not present

## 2017-08-12 DIAGNOSIS — I10 Essential (primary) hypertension: Secondary | ICD-10-CM | POA: Diagnosis not present

## 2017-08-12 DIAGNOSIS — J449 Chronic obstructive pulmonary disease, unspecified: Secondary | ICD-10-CM | POA: Diagnosis present

## 2017-08-12 DIAGNOSIS — I255 Ischemic cardiomyopathy: Secondary | ICD-10-CM | POA: Diagnosis present

## 2017-08-12 DIAGNOSIS — Z808 Family history of malignant neoplasm of other organs or systems: Secondary | ICD-10-CM

## 2017-08-12 HISTORY — DX: Long term (current) use of insulin: Z79.4

## 2017-08-12 HISTORY — DX: Atherosclerotic heart disease of native coronary artery without angina pectoris: I25.10

## 2017-08-12 HISTORY — DX: Peripheral vascular angioplasty status with implants and grafts: Z95.820

## 2017-08-12 HISTORY — DX: Chronic kidney disease, stage 2 (mild): N18.2

## 2017-08-12 HISTORY — DX: Ischemic cardiomyopathy: I25.5

## 2017-08-12 HISTORY — DX: Reserved for inherently not codable concepts without codable children: IMO0001

## 2017-08-12 HISTORY — DX: Type 2 diabetes mellitus without complications: E11.9

## 2017-08-12 LAB — CBC
HEMATOCRIT: 51 % (ref 40.0–52.0)
HEMOGLOBIN: 17.6 g/dL (ref 13.0–18.0)
MCH: 30.6 pg (ref 26.0–34.0)
MCHC: 34.5 g/dL (ref 32.0–36.0)
MCV: 88.6 fL (ref 80.0–100.0)
Platelets: 284 10*3/uL (ref 150–440)
RBC: 5.75 MIL/uL (ref 4.40–5.90)
RDW: 13.8 % (ref 11.5–14.5)
WBC: 14.7 10*3/uL — AB (ref 3.8–10.6)

## 2017-08-12 LAB — BASIC METABOLIC PANEL
ANION GAP: 10 (ref 5–15)
BUN: 21 mg/dL — ABNORMAL HIGH (ref 6–20)
CALCIUM: 10 mg/dL (ref 8.9–10.3)
CO2: 26 mmol/L (ref 22–32)
Chloride: 103 mmol/L (ref 101–111)
Creatinine, Ser: 1.22 mg/dL (ref 0.61–1.24)
GFR, EST NON AFRICAN AMERICAN: 58 mL/min — AB (ref >60)
Glucose, Bld: 132 mg/dL — ABNORMAL HIGH (ref 65–99)
POTASSIUM: 4.1 mmol/L (ref 3.5–5.1)
Sodium: 139 mmol/L (ref 135–145)

## 2017-08-12 LAB — GLUCOSE, CAPILLARY
GLUCOSE-CAPILLARY: 163 mg/dL — AB (ref 65–99)
GLUCOSE-CAPILLARY: 130 mg/dL — AB (ref 65–99)
GLUCOSE-CAPILLARY: 151 mg/dL — AB (ref 65–99)
Glucose-Capillary: 133 mg/dL — ABNORMAL HIGH (ref 65–99)

## 2017-08-12 LAB — HEPATIC FUNCTION PANEL
ALBUMIN: 3.8 g/dL (ref 3.5–5.0)
ALK PHOS: 56 U/L (ref 38–126)
ALT: 35 U/L (ref 17–63)
AST: 39 U/L (ref 15–41)
BILIRUBIN INDIRECT: 0.5 mg/dL (ref 0.3–0.9)
BILIRUBIN TOTAL: 0.7 mg/dL (ref 0.3–1.2)
Bilirubin, Direct: 0.2 mg/dL (ref 0.1–0.5)
Total Protein: 6.8 g/dL (ref 6.5–8.1)

## 2017-08-12 LAB — HEPARIN LEVEL (UNFRACTIONATED)
HEPARIN UNFRACTIONATED: 0.28 [IU]/mL — AB (ref 0.30–0.70)
Heparin Unfractionated: <0.1 IU/mL — ABNORMAL LOW (ref 0.30–0.70)

## 2017-08-12 MED ORDER — IRBESARTAN 300 MG PO TABS
300.0000 mg | ORAL_TABLET | Freq: Every day | ORAL | Status: DC
  Administered 2017-08-13 – 2017-08-14 (×2): 300 mg via ORAL
  Filled 2017-08-12 (×2): qty 1

## 2017-08-12 MED ORDER — ACETAMINOPHEN 325 MG PO TABS: 650.0000 mg | ORAL_TABLET | ORAL | Status: DC | PRN

## 2017-08-12 MED ORDER — IRBESARTAN 300 MG PO TABS: 300.0000 mg | ORAL_TABLET | Freq: Every day | ORAL | 2 refills | Status: DC

## 2017-08-12 MED ORDER — NICOTINE 14 MG/24HR TD PT24: 14.0000 mg | MEDICATED_PATCH | Freq: Every day | TRANSDERMAL | 0 refills | Status: DC

## 2017-08-12 MED ORDER — HEPARIN (PORCINE) IN NACL 100-0.45 UNIT/ML-% IJ SOLN: 1650.0000 [IU]/h | INTRAMUSCULAR | 0 refills | Status: DC

## 2017-08-12 MED ORDER — ATORVASTATIN CALCIUM 80 MG PO TABS
80.0000 mg | ORAL_TABLET | Freq: Every day | ORAL | Status: DC
  Administered 2017-08-12 – 2017-08-13 (×2): 80 mg via ORAL
  Filled 2017-08-12 (×2): qty 1

## 2017-08-12 MED ORDER — NITROGLYCERIN 0.4 MG SL SUBL: 0.4000 mg | SUBLINGUAL_TABLET | SUBLINGUAL | Status: DC | PRN

## 2017-08-12 MED ORDER — INSULIN ASPART 100 UNIT/ML ~~LOC~~ SOLN: 0.0000 [IU] | Freq: Three times a day (TID) | SUBCUTANEOUS | Status: DC

## 2017-08-12 MED ORDER — NITROGLYCERIN 0.4 MG SL SUBL: 0.4000 mg | SUBLINGUAL_TABLET | SUBLINGUAL | 2 refills | Status: DC | PRN

## 2017-08-12 MED ORDER — ONDANSETRON HCL 4 MG/2ML IJ SOLN: 4.0000 mg | Freq: Four times a day (QID) | INTRAMUSCULAR | Status: DC | PRN

## 2017-08-12 MED ORDER — ATORVASTATIN CALCIUM 80 MG PO TABS: 80.0000 mg | ORAL_TABLET | Freq: Every day | ORAL | 2 refills | Status: DC

## 2017-08-12 MED ORDER — LORATADINE 10 MG PO TABS: 10.0000 mg | ORAL_TABLET | Freq: Every day | ORAL | 0 refills | Status: DC | PRN

## 2017-08-12 MED ORDER — FLUTICASONE FUROATE-VILANTEROL 200-25 MCG/INH IN AEPB
1.0000 | INHALATION_SPRAY | Freq: Every day | RESPIRATORY_TRACT | Status: DC
  Administered 2017-08-13: 1 via RESPIRATORY_TRACT
  Filled 2017-08-12: qty 28

## 2017-08-12 MED ORDER — FLUTICASONE PROPIONATE 50 MCG/ACT NA SUSP
1.0000 | Freq: Every day | NASAL | Status: DC
  Filled 2017-08-12: qty 16

## 2017-08-12 MED ORDER — METOPROLOL TARTRATE 25 MG PO TABS
25.0000 mg | ORAL_TABLET | Freq: Two times a day (BID) | ORAL | Status: DC
  Administered 2017-08-12 – 2017-08-14 (×4): 25 mg via ORAL
  Filled 2017-08-12 (×4): qty 1

## 2017-08-12 MED ORDER — METOPROLOL TARTRATE 25 MG PO TABS: 25.0000 mg | ORAL_TABLET | Freq: Two times a day (BID) | ORAL | 2 refills | Status: DC

## 2017-08-12 MED ORDER — ZOLPIDEM TARTRATE 5 MG PO TABS: 5.0000 mg | ORAL_TABLET | Freq: Every evening | ORAL | 0 refills | Status: DC | PRN

## 2017-08-12 MED ORDER — INSULIN DETEMIR 100 UNIT/ML ~~LOC~~ SOLN: 50.0000 [IU] | Freq: Two times a day (BID) | SUBCUTANEOUS | 11 refills | Status: DC

## 2017-08-12 MED ORDER — INSULIN DETEMIR 100 UNIT/ML ~~LOC~~ SOLN
50.0000 [IU] | Freq: Two times a day (BID) | SUBCUTANEOUS | Status: DC
  Administered 2017-08-12 – 2017-08-14 (×3): 50 [IU] via SUBCUTANEOUS
  Filled 2017-08-12 (×5): qty 0.5

## 2017-08-12 MED ORDER — HEPARIN BOLUS VIA INFUSION
1400.0000 [IU] | Freq: Once | INTRAVENOUS | Status: AC
  Administered 2017-08-12: 1400 [IU] via INTRAVENOUS
  Filled 2017-08-12: qty 1400

## 2017-08-12 MED ORDER — LORATADINE 10 MG PO TABS
10.0000 mg | ORAL_TABLET | Freq: Every day | ORAL | Status: DC
  Administered 2017-08-13 – 2017-08-14 (×2): 10 mg via ORAL
  Filled 2017-08-12 (×2): qty 1

## 2017-08-12 MED ORDER — NICOTINE 14 MG/24HR TD PT24
14.0000 mg | MEDICATED_PATCH | Freq: Every day | TRANSDERMAL | Status: DC
  Administered 2017-08-13 – 2017-08-14 (×2): 14 mg via TRANSDERMAL
  Filled 2017-08-12 (×2): qty 1

## 2017-08-12 MED ORDER — ASPIRIN EC 81 MG PO TBEC
81.0000 mg | DELAYED_RELEASE_TABLET | Freq: Every day | ORAL | Status: DC
  Administered 2017-08-14: 81 mg via ORAL
  Filled 2017-08-12: qty 1

## 2017-08-12 MED ORDER — HEPARIN (PORCINE) IN NACL 100-0.45 UNIT/ML-% IJ SOLN
1650.0000 [IU]/h | INTRAMUSCULAR | Status: DC
  Administered 2017-08-13: 1650 [IU]/h via INTRAVENOUS
  Filled 2017-08-12: qty 250

## 2017-08-12 MED ORDER — TICAGRELOR 90 MG PO TABS: 90.0000 mg | ORAL_TABLET | Freq: Two times a day (BID) | ORAL | 2 refills | Status: DC

## 2017-08-12 MED ORDER — TICAGRELOR 90 MG PO TABS
90.0000 mg | ORAL_TABLET | Freq: Two times a day (BID) | ORAL | Status: DC
  Administered 2017-08-12 – 2017-08-14 (×4): 90 mg via ORAL
  Filled 2017-08-12 (×4): qty 1

## 2017-08-12 NOTE — Progress Notes (Signed)
Report was called to Marketing executive at [facility] at [facility]. [facility] was notified of need for transport. No further orders at this time.

## 2017-08-12 NOTE — Progress Notes (Addendum)
ANTICOAGULATION CONSULT NOTE   Pharmacy Consult for Heparin  Indication: STEMI  No Known Allergies  Patient Measurements: Height: 5\' 11"  (180.3 cm) Weight: 205 lb 14.6 oz (93.4 kg) IBW/kg (Calculated) : 75.3 Heparin Dosing Weight: 94.2 kg  Vital Signs: Temp: 98.7 F (37.1 C) (05/07 1711) Temp Source: Oral (05/07 1711) BP: 135/92 (05/07 1711) Pulse Rate: 66 (05/07 1711)  Labs: Recent Labs    08/10/17 0507 08/10/17 1054 08/10/17 1703 08/10/17 2311 08/11/17 0527 08/11/17 1219 08/11/17 2157 08/12/17 0411  HGB 16.5  --   --   --  16.8  --   --  17.6  HCT 46.6  --   --   --  48.8  --   --  51.0  PLT 253  --   --   --  271  --   --  284  APTT 28  --   --   --   --   --   --   --   LABPROT 12.2  --   --   --   --   --   --   --   INR 0.91  --   --   --   --   --   --   --   HEPARINUNFRC  --   --   --   --   --  0.25* 0.31 0.28*  CREATININE 1.15  --   --   --  0.99  --   --   --   TROPONINI 0.03* 0.52* 1.27* 2.24*  --   --   --   --     Estimated Creatinine Clearance: 79.9 mL/min (by C-G formula based on SCr of 0.99 mg/dL).   Assessment: Pharmacy consulted for heparin drip management for 76 YOM admitted with STEMI. The patient was transferred from [facility] to [facility] on 5/7 already receiving Heparin at a drip rate of 1650 units/hr - the rate as noted to be increased around 0900 this AM. Will recheck a heparin level on this current drip rate and address heparin dosing from there.    Goal of Therapy:  Heparin level 0.3-0.7 units/ml Monitor platelets by anticoagulation protocol: Yes   Plan:  - Continue Heparin at 1650 units/hr - resumed from [facility] - Will obtain a heparin level at 1800 this evening and address heparin dosing at that time - Daily heparin level, CBC, will monitor for bleeding  Thank you for allowing pharmacy to be a part of this patient's care.  Alycia Rossetti, PharmD, BCPS Clinical Pharmacist Pager: [phone] Clinical phone for 08/12/2017 [phone] 08/12/2017 5:23  PM     -------------------------------------------------------------------------------- Addendum:  Heparin level this evening is SUBtherapeutic (HL<0.1) despite a rate increase earlier today at [facility] - confirmed with RN that the drip was not turned off during transfer and was running on admission to [facility]. Asked RN to check IV site, running in L-AC and slightly reddened around IV-site, the patient likely interrupting the drip with arm bends. The RN is going to move the drip to the R-forearm PIV - will keep at the current rate and recheck a heparin level in 8 hours.  Plan - Continue Heparin at 1650 units/hr - Will continue to monitor for any signs/symptoms of bleeding and will follow up with heparin level in 8 hours from transfer to the new IV site  Alycia Rossetti, PharmD, BCPS Pager: [phone] 7:40 PM

## 2017-08-12 NOTE — Progress Notes (Signed)
Patient arrived on the from [facility], via carelink assessment completed see flowsheet, placed on tele ccmd notified, patient oriented to room and staff, bed in lowest position, call bell within reach will continue to monitor.

## 2017-08-12 NOTE — Progress Notes (Signed)
Patient arrived at the unit at around 2253, place patient on telemetry monitor. Heparin drip at 15 ml/hr. RN will continue to monitor.

## 2017-08-12 NOTE — H&P (Signed)
Cardiology Admission History and Physical:   Patient ID: ERNESTO ZUKOWSKI; MRN: [id number]; DOB: [birth year]   Admission date: (08/12/2017)  Primary Care Provider: Juluis Pitch, MD Primary Cardiologist: Dr. Saralyn Pilar, MD [facility])  Chief Complaint:  Chest pain  Patient Profile:   Henry [name] is a 72 y.o. 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   History of Present Illness:   Henry [name] is followed by Dr. Saralyn Pilar, MD with [facility]. Prior 24-Hour Holter in 2017 showed a predominant rhythm of sinus with a mean heart rate of 93 bpm, frequent PACs and occasional PVCs. Treadmill Myoview in 08/2016 for chest pain following an ED visit in which he ruled out, showed a mild inferoapical defect consistent with scar vs artifact, without evidence for ischemia, EF 61%.   Patient presented to [facility] on 5/5 with chest pain that began at 3:30 AM. EMS EKG showed anterior ST elevation. Code STEMI was called by ED staff. He was given ASA, 4,000 units of IV heparin, and SL NTG in the ED. He was taken emergently to the cardiac cath lab which showed thrombotic sub-total occlusion of the mid LAD s/p successful PTCA/DES x 2 to the mid LAD. There was also residual stenosis D1 at 70%, mid LCX at 30%, dominant RCA with the entire proximal and mid vessel being heavily calcified. The proximal vessel had diffuse 50% stenosis with the mid vessel having focal, heavily calcified 99% stenosis. Preserved LVSF with subtle WMA of the anterior wall. He was placed on ASA and Brilinta. He was continued on Aggrastat for 3 hours post PCI. Followed by continuation of heparin. It was recommend the patient be transferred to [facility] for orbital atherectomy of the RCA by Dr. Fletcher Anon. Case was discussed between Drs. Ubaldo Glassing and Arida with plans to transfer the patient to [facility] on 5/7 followed by RCA orbital atherectomy on 5/8. TTE on 5/5 was an overall difficult study with poor windows, not able to assess wall motion with an estimated EF of 40-50%. Troponin peaked 2.24 (up trending at last check on 5/5). LDL of 65. A1c 6.7. Post-cath HGb stable with mild leukocytosis persistent. Renal function post cath stable. Currently without chest pain or SOB. Tolerating medications without issues. Vitals stable.    Past Medical History:  Diagnosis Date  . Arthritis   . CAD (coronary artery disease)    a. anterior stemi 08/10/2017; b. LHC 08/10/17: pLAD 70, p-mLAD 99 thrombotic, ostD1 70, p-mLCx 30, pRCA-1 50 calcified, pRCA-2 99 calcified, dRCA 30. Successful PCI/DES to p-mLAD x 2  . Cancer ([place])    skin  . CKD (chronic kidney disease), stage II   . Glaucoma   . Hyperlipidemia   . Hypertension   . Insulin dependent diabetes mellitus ([place])   . Ischemic cardiomyopathy    a. TTE 08/10/17: technically difficult study, no diagnostic RWMA, EF 40-50%, poor windows, very difficult study     Past Surgical History:  Procedure Laterality Date  . COLONOSCOPY WITH PROPOFOL N/A 12/08/2014   Procedure: COLONOSCOPY WITH PROPOFOL;  Surgeon: Manya Silvas, MD;  Location: [facility] ENDOSCOPY;  Service: Endoscopy;  Laterality: N/A;  . COLONOSCOPY WITH PROPOFOL N/A 01/10/2016   Procedure: COLONOSCOPY WITH PROPOFOL;  Surgeon: Manya Silvas, MD;  Location: [facility] ENDOSCOPY;  Service: Endoscopy;  Laterality: N/A;  . CORONARY/GRAFT ACUTE MI REVASCULARIZATION N/A  08/10/2017   Procedure: Coronary/Graft Acute MI Revascularization;  Surgeon: Burnell Blanks, MD;  Location: [place] CV LAB;  Service: Cardiovascular;  Laterality: N/A;  . EXTRACORPOREAL SHOCK WAVE LITHOTRIPSY Right 11/09/2015   Procedure: EXTRACORPOREAL SHOCK WAVE LITHOTRIPSY (ESWL);  Surgeon: Royston Cowper, MD;  Location: [facility] ORS;  Service: Urology;  Laterality: Right;  . EXTRACORPOREAL SHOCK WAVE LITHOTRIPSY Left 10/17/2016    Procedure: EXTRACORPOREAL SHOCK WAVE LITHOTRIPSY (ESWL);  Surgeon: Royston Cowper, MD;  Location: [facility] ORS;  Service: Urology;  Laterality: Left;  . LEFT HEART CATH AND CORONARY ANGIOGRAPHY N/A 08/10/2017   Procedure: LEFT HEART CATH AND CORONARY ANGIOGRAPHY;  Surgeon: Burnell Blanks, MD;  Location: [place] CV LAB;  Service: Cardiovascular;  Laterality: N/A;  . LITHOTRIPSY     x 6  . PAROTIDECTOMY  2015  . TONSILLECTOMY       Medications Prior to Admission: Prior to Admission medications   Medication Sig Start Date End Date Taking? Authorizing Provider  acetaminophen (TYLENOL) 325 MG tablet Take 2 tablets (650 mg total) by mouth every 6 (six) hours as needed for mild pain (or Fever >/= 101). 01/27/16   Gouru, Illene Silver, MD  albuterol (PROVENTIL HFA;VENTOLIN HFA) 108 (90 Base) MCG/ACT inhaler Inhale 2 puffs into the lungs every 6 (six) hours as needed for wheezing or shortness of breath. 01/27/16   Gouru, Illene Silver, MD  fluticasone (FLONASE) 50 MCG/ACT nasal spray Place 1 spray into both nostrils daily.    [provider]  guaiFENesin-dextromethorphan (ROBITUSSIN DM) 100-10 MG/5ML syrup Take 10 mLs by mouth every 6 (six) hours as needed for cough. Patient not taking: Reported on 10/17/2016 01/27/16   Nicholes Mango, MD  HYDROcodone-acetaminophen (NORCO) 10-325 MG tablet Take 1-2 tablets by mouth every 4 (four) hours as needed for moderate pain. Maximum dose per 24 hours -8 pills. 11/09/15   Royston Cowper, MD  HYDROcodone-acetaminophen (NORCO) 10-325 MG tablet Take 1-2 tablets by mouth every 4 (four) hours as needed for moderate pain. Maximum dose per 24 hours -8 pills. 10/17/16   Royston Cowper, MD  insulin detemir (LEVEMIR) 100 UNIT/ML injection Inject 0.4 mLs (40 Units total) into the skin every morning. 01/27/16   Gouru, Illene Silver, MD  insulin detemir (LEVEMIR) 100 UNIT/ML injection Inject 0.35 mLs (35 Units total) into the skin at bedtime. 01/27/16   Nicholes Mango, MD  levofloxacin  (LEVAQUIN) 500 MG tablet Take 1 tablet (500 mg total) by mouth daily. 10/17/16   Royston Cowper, MD  multivitamin-iron-minerals-folic acid (CENTRUM) chewable tablet Chew 1 tablet by mouth daily.    [provider]  ondansetron (ZOFRAN ODT) 8 MG disintegrating tablet Take 1 tablet (8 mg total) by mouth every 6 (six) hours as needed for nausea or vomiting. 10/17/16   Royston Cowper, MD  simvastatin (ZOCOR) 40 MG tablet Take 40 mg by mouth daily.    [provider]  valsartan (DIOVAN) 320 MG tablet Take 320 mg by mouth daily.    [provider]     Allergies:   No Known Allergies  Social History:   Social History   Socioeconomic History  . Marital status: Single    Spouse name: Not on file  . Number of children: Not on file  . Years of education: Not on file  . Highest education level: Not on file  Occupational History  . Not on file  Social Needs  . Financial resource strain: Not on file  . Food insecurity:    Worry:  Not on file    Inability: Not on file  . Transportation needs:    Medical: Not on file    Non-medical: Not on file  Tobacco Use  . Smoking status: Current Every Day Smoker    Packs/day: 0.10    Types: Cigarettes  . Smokeless tobacco: Never Used  Substance and Sexual Activity  . Alcohol use: No  . Drug use: No  . Sexual activity: Not on file  Lifestyle  . Physical activity:    Days per week: Not on file    Minutes per session: Not on file  . Stress: Not on file  Relationships  . Social connections:    Talks on phone: Not on file    Gets together: Not on file    Attends religious service: Not on file    Active member of club or organization: Not on file    Attends meetings of clubs or organizations: Not on file    Relationship status: Not on file  . Intimate partner violence:    Fear of current or ex partner: Not on file    Emotionally abused: Not on file    Physically abused: Not on file    Forced sexual activity: Not on  file  Other Topics Concern  . Not on file  Social History Narrative  . Not on file    Family History:   The patient's family history includes Heart attack in his mother; Melanoma in his father; Parkinsonism in his father.    ROS:  Review of Systems  Constitutional: Positive for diaphoresis and malaise/fatigue. Negative for chills, fever and weight loss.  HENT: Negative for congestion.   Eyes: Negative for discharge and redness.  Respiratory: Positive for shortness of breath. Negative for cough, hemoptysis, sputum production and wheezing.   Cardiovascular: Positive for chest pain. Negative for palpitations, orthopnea, claudication, leg swelling and PND.  Gastrointestinal: Positive for nausea. Negative for abdominal pain, blood in stool, heartburn, melena and vomiting.  Genitourinary: Negative for hematuria.  Musculoskeletal: Negative for falls and myalgias.  Skin: Negative for rash.  Neurological: Positive for weakness. Negative for dizziness, tingling, tremors, sensory change, speech change, focal weakness and loss of consciousness.  Endo/Heme/Allergies: Does not bruise/bleed easily.  Psychiatric/Behavioral: Negative for substance abuse. The patient is not nervous/anxious.   All other systems reviewed and are negative.      Physical Exam/Data:  Blood pressure 105/68, pulse 77, temperature 98.3 F (36.8 C), resp. rate 18, height 5\' 11"  (1.803 m), weight 94.3 kg (207 lb 12.8 oz), SpO2 96 %.  General:  well nourished, well developed, in no acute distress HEENT: normal Lymph: no adenopathy Neck: no JVD Endocrine:  no thryomegaly Vascular: no carotid bruits; FA pulses 2+ bilaterally without bruits  Cardiac:  normal S1, S2; RRR; no murmur; right radial cardiac cath site well healing without active bleeding, bruising, swelling, erythema, or warmth. Radial pulse 2+ Lungs:  clear to auscultation bilaterally, no wheezing, rhonchi or rales  Abd: soft, nontender, no hepatomegaly  Ext: no  edema Musculoskeletal:  no deformities, BUE and BLE strength normal and equal Skin: warm and dry  Neuro: cranial nerves 2-12 intact, no focal abnormalities noted Psych: normal affect    EKG:  The ECG that was done was personally reviewed and demonstrates NSR, 73 bpm, occasional PACs, anterior TWI, nonspecific inferior st/t changes  Relevant CV Studies: LHC 08/10/2017: Coronary Findings   Diagnostic  Dominance: Right  Left Anterior Descending  Vessel is large.  Prox LAD lesion  70% stenosed  Prox LAD lesion is 70% stenosed.  Prox LAD to Mid LAD lesion 99% stenosed  Prox LAD to Mid LAD lesion is 99% stenosed. The lesion is thrombotic.  First Diagonal Branch  Vessel is small in size.  Ost 1st Diag lesion 70% stenosed  Ost 1st Diag lesion is 70% stenosed.  Left Circumflex  Vessel is large.  Prox Cx to Mid Cx lesion 30% stenosed  Prox Cx to Mid Cx lesion is 30% stenosed.  First Obtuse Marginal Branch  Vessel is small in size.  Second Obtuse Marginal Branch  Vessel is moderate in size.  Third Obtuse Marginal Branch  Vessel is small in size.  Right Coronary Artery  Vessel is large.  Prox RCA-1 lesion 50% stenosed  Prox RCA-1 lesion is 50% stenosed. The lesion is calcified.  Prox RCA-2 lesion 99% stenosed  Prox RCA-2 lesion is 99% stenosed. The lesion is calcified.  Dist RCA lesion 30% stenosed  Dist RCA lesion is 30% stenosed.  Intervention   Prox LAD lesion  Stent  Pre-stent angioplasty was performed using a BALLOON [place] RX 2.0X12. A drug-eluting stent was successfully placed using a STENT [place] 2.50 X 12 MM. Stent overlaps previously placed stent. Post-stent angioplasty was performed using a BALLOON [place].  Post-Intervention Lesion Assessment  The intervention was successful. Pre-interventional TIMI flow is 1. Post-intervention TIMI flow is 3. No complications occurred at this lesion.  There is no residual stenosis post intervention.  Prox LAD to Mid LAD  lesion  Stent  Pre-stent angioplasty was performed using a BALLOON [place] RX 2.0X12. A drug-eluting stent was successfully placed using a STENT [place]ERRA 2.50 X 28 MM. Post-stent angioplasty was performed using a BALLOON [place].  Post-Intervention Lesion Assessment  The intervention was successful. Pre-interventional TIMI flow is 1. Post-intervention TIMI flow is 3. No complications occurred at this lesion.  There is no residual stenosis post intervention.  Wall Motion              Left Heart   Left Ventricle The left ventricular size is normal. The left ventricular systolic function is normal. LV end diastolic pressure is normal. The left ventricular ejection fraction is 55-65% by visual estimate. There are LV function abnormalities due to segmental dysfunction. There is no evidence of mitral regurgitation.  Coronary Diagrams   Diagnostic Diagram       Post-Intervention Diagram        Conclusion     Prox RCA-1 lesion is 50% stenosed.  Prox RCA-2 lesion is 99% stenosed.  Dist RCA lesion is 30% stenosed.  Prox Cx to Mid Cx lesion is 30% stenosed.  Ost 1st Diag lesion is 70% stenosed.  Prox LAD to Mid LAD lesion is 99% stenosed.  Prox LAD lesion is 70% stenosed.  A drug-eluting stent was successfully placed using a STENT [place] 2.50 X 28 MM.  Post intervention, there is a 0% residual stenosis.  A drug-eluting stent was successfully placed using a STENT SIERRA 2.50 X 12 MM.  Post intervention, there is a 0% residual stenosis.  The left ventricular systolic function is normal.  LV end diastolic pressure is normal.  The left ventricular ejection fraction is 55-65% by visual estimate.  There is no mitral valve regurgitation.   1. Acute anterior STEMI secondary to thrombotic sub-total occlusion of the mid LAD 2. Successful PTCA/DES x 2 mid LAD 3. The LAD is a large caliber vessel proximally but in the mid vessel tapers to a  smaller caliber vessel  and it barely reaches the apex. The mid LAD has a thrombotic, sub-total occlusion with sluggish flow down the LAD. The first Diagonal branch is small in caliber with 70% proximal stenosis.  4. The Circumflex is a moderate to large caliber vessel with mild mid stenosis.  5. The RCA is a large dominant vessel. The entire proximal and mid vessel is heavily calcified. The proximal vessel has diffuse 50% stenosis The mid vessel has a focal, heavily calcified 99% stenosis.  6. Preserved LV systolic function with subtle wall motion abnormality anterior wall  Recommendations: He will be admitted to the ICU. I have spoken to Dr. Marcille Blanco from the [facility] Service who will admit. He will be continued on ASA and Brilinta. He will need DAPT for one year. I will start a beta blocker and high intensity statin. I will continue Aggrastat for 3 hours post PCI (thombus seen in distal LAD post angioplasty). Echo later today. He has been see in the past by Dr. Saralyn Pilar. I will ask [facility] to follow him here in the hospital. He will need staged PCI of his RCA later this week. Given the heavy calcification, the lesion will likely need to be treated with a scoring balloon or orbital atherectomy prior to stenting.     TTE 08/10/2017: Study Conclusions  - Procedure narrative: Transthoracic echocardiography. Image   quality was suboptimal. The study was technically difficult, as a   result of poor acoustic windows and poor sound wave transmission. - Left ventricle: Although no diagnostic regional wall motion   abnormality was identified, this possibility cannot be completely   excluded on the basis of this study. The study is not technically   sufficient to allow evaluation of LV diastolic function.  Impressions:  - Very limited views. EF appears mildy reduced with estimated ef of   40-50% but wall motion is very difficult to assess in this study.  Laboratory Data:  Chemistry Recent  Labs  Lab 08/10/17 0507 08/11/17 0527  NA 137 137  K 4.3 3.8  CL 100* 102  CO2 31 26  GLUCOSE 155* 151*  BUN 25* 19  CREATININE 1.15 0.99  CALCIUM 10.0 9.3  GFRNONAA >60 >60  GFRAA >60 >60  ANIONGAP 6 9    Recent Labs  Lab 08/10/17 0507  PROT 7.1  ALBUMIN 4.1  AST 30  ALT 27  ALKPHOS 61  BILITOT 0.5   Hematology Recent Labs  Lab 08/11/17 0527 08/12/17 0411  WBC 13.3* 14.7*  RBC 5.51 5.75  HGB 16.8 17.6  HCT 48.8 51.0  MCV 88.7 88.6  MCH 30.5 30.6  MCHC 34.4 34.5  RDW 14.2 13.8  PLT 271 284   Cardiac Enzymes Recent Labs  Lab 08/10/17 1054 08/10/17 1703 08/10/17 2311  TROPONINI 0.52* 1.27* 2.24*   No results for input(s): TROPIPOC in the last 168 hours.  BNPNo results for input(s): BNP, PROBNP in the last 168 hours.  DDimer No results for input(s): DDIMER in the last 168 hours.  Radiology/Studies:  CXR 08/12/2017: Bibasilar atelectasis   Assessment and Plan:   1. CAD with anterior STEMI with residual CAD as detailed above: -Chest pain free -Status post PCI/DES to the LAD x 2 as above -Plan for transfer to [facility] for orbital atherectomy of the RCA by Dr. Fletcher Anon, MD on 08/13/2017 -Continue DAPT with ASA and Brilinta (next dose of ASA on 5/8 and next dose of Brilinta 22:00 on 5/7 as he has received  his AM dose of Brilinta on 5/7) -Continue heparin gtt -Aggressive risk factor modification and secondary prevention -Cardiac rehab  2. ICM: -He does not appear grossly volume up -Irbesartan and Lopressor as BP allow -Echo as above -CHF education -Daily weights with strict Is and Os  3. HLD: -LDL of 65 upon admission on simvastatin -Continue Lipitor 80 mg daily started post PCI -Outpatient lipid and liver  4. HTN: -BP well controlled -Continue current medications as above  5. IDDM: -A1c as above -Continue SSI  6. COPD/allergies: -No active exacerbation  -Continue PTA Breo and Flonase   Severity of Illness: The appropriate patient status  for this patient is INPATIENT. Inpatient status is judged to be reasonable and necessary in order to provide the required intensity of service to ensure the patient's safety. The patient's presenting symptoms, physical exam findings, and initial radiographic and laboratory data in the context of their chronic comorbidities is felt to place them at high risk for further clinical deterioration. Furthermore, it is not anticipated that the patient will be medically stable for discharge from the hospital within 2 midnights of admission. The following factors support the patient status of inpatient.   " The patient's presenting symptoms include as above. " The worrisome physical exam findings include as above. " The initial radiographic and laboratory data are worrisome because of as above. " The chronic co-morbidities include as above.   * I certify that at the point of admission it is my clinical judgment that the patient will require inpatient hospital care spanning beyond 2 midnights from the point of admission due to high intensity of service, high risk for further deterioration and high frequency of surveillance required.*    For questions or updates, please contact [place] Please consult [email or web address] for contact info under Cardiology/STEMI.    Signed, Henry Faith, PA-C  08/12/2017 10:08 AM

## 2017-08-12 NOTE — Discharge Summary (Signed)
Henry [name] at [place] NAME: Henry [name]    MR#:  [id number]  DATE OF BIRTH:  Jun 06, 1945  DATE OF ADMISSION:  08/10/2017   ADMITTING PHYSICIAN: Gladstone Lighter, MD  DATE OF DISCHARGE:  08/12/17  PRIMARY CARE PHYSICIAN: Juluis Pitch, MD   ADMISSION DIAGNOSIS:   Precordial pain [R07.2] ST elevation myocardial infarction involving left anterior descending (LAD) coronary artery (HCC) [I21.02]  DISCHARGE DIAGNOSIS:   Active Problems:   ST elevation myocardial infarction involving left anterior descending (LAD) coronary artery (HCC)   Acute ST elevation myocardial infarction (STEMI) involving left anterior descending (LAD) coronary artery ([place])   SECONDARY DIAGNOSIS:   Past Medical History:  Diagnosis Date  . Arthritis   . Cancer ([place])    skin  . Chronic kidney disease   . Diabetes mellitus without complication ([place])   . Glaucoma   . Hyperlipidemia   . Hypertension     HOSPITAL COURSE:   DonaldDoornheimis a72 y.o.malewith a known history of insulin-dependent diabetes mellitus, hypertension, CKD, hyperlipidemia, ongoing smoking presents to hospital secondary to worsening chest pain that started early this morning  1. Acute anterior ST segment elevation MI-secondary to subtotal occlusion of LAD -Appreciate cardiology consult. Patient was taken to cardiac catheterization lab on  -Cardiac cath revealed a 50% proximal RCA, 99% proximal RCA, 30% distal RCA, 30% mid to distal left circumflex, 70% first diagonal, 99% proximal to mid LAD, 70% LAD lesion after the 99% stenosis. - had 2 drug-eluting stents placed to mid LAD. -Also has RCA calcification and might need orbital atherectomy for the same- not available at [facility].  On heparin drip and will be transferred to [facility] for consideration of PCI of proximal RCA -Echocardiogram was suboptimal quality, EF appears to be reduced at 40 to 50%.  Difficult to assess  wall motion of normality.  -on aspirin, Brilinta, statin, metoprolol and Avapro. -Continue nitroglycerin as needed -In sinus rhythm now -Cardiac rehab after discharge  2. Diabetes mellitus-on Levemir and also on sliding scale insulin.  3. Nasal congestion-on Dulera, Claritin for now with improvement. No indication for systemic steroids yet.  4. Tobacco use disorder-strongly counseled, on nicotine patch  5. 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 Monitor  Will be transferred to [facility] for further cardiac intervention   DISCHARGE CONDITIONS:   Guarded  CONSULTS OBTAINED:   Treatment Team:  Cassandria Santee, MD Teodoro Spray, MD Wellington Hampshire, MD  DRUG ALLERGIES:   No Known Allergies DISCHARGE MEDICATIONS:   Allergies as of 08/12/2017   No Known Allergies     Medication List    STOP taking these medications   guaiFENesin-dextromethorphan 100-10 MG/5ML syrup Commonly known as:  ROBITUSSIN DM   HYDROcodone-acetaminophen 10-325 MG tablet Commonly known as:  NORCO   levofloxacin 500 MG tablet Commonly known as:  LEVAQUIN   simvastatin 40 MG tablet Commonly known as:  ZOCOR   valsartan 320 MG tablet Commonly known as:  DIOVAN Replaced by:  irbesartan 300 MG tablet     TAKE these medications   acetaminophen 325 MG tablet Commonly known as:  TYLENOL Take 2 tablets (650 mg total) by mouth every 6 (six) hours as needed for mild pain (or Fever >/= 101).   albuterol 108 (90 Base) MCG/ACT inhaler Commonly known as:  PROVENTIL HFA;VENTOLIN HFA Inhale 2 puffs into the lungs every 6 (six) hours as needed for wheezing or shortness of breath.   atorvastatin 80  MG tablet Commonly known as:  LIPITOR Take 1 tablet (80 mg total) by mouth daily at 6 PM.   fluticasone 50 MCG/ACT nasal spray Commonly known as:  FLONASE Place 1 spray into both nostrils daily.   heparin 100-0.45 UNIT/ML-% infusion Inject 1,650 Units/hr into the vein  continuous.   insulin detemir 100 UNIT/ML injection Commonly known as:  LEVEMIR Inject 0.5 mLs (50 Units total) into the skin 2 (two) times daily. What changed:    how much to take  when to take this  Another medication with the same name was removed. Continue taking this medication, and follow the directions you see here.   irbesartan 300 MG tablet Commonly known as:  AVAPRO Take 1 tablet (300 mg total) by mouth daily. Start taking on:  08/13/2017 Replaces:  valsartan 320 MG tablet   loratadine 10 MG tablet Commonly known as:  CLARITIN Take 1 tablet (10 mg total) by mouth daily as needed for allergies.   metoprolol tartrate 25 MG tablet Commonly known as:  LOPRESSOR Take 1 tablet (25 mg total) by mouth 2 (two) times daily.   multivitamin-iron-minerals-folic acid chewable tablet Chew 1 tablet by mouth daily.   nicotine 14 mg/24hr patch Commonly known as:  NICODERM CQ - dosed in mg/24 hours Place 1 patch (14 mg total) onto the skin daily. Start taking on:  08/13/2017   nitroGLYCERIN 0.4 MG SL tablet Commonly known as:  NITROSTAT Place 1 tablet (0.4 mg total) under the tongue every 5 (five) minutes as needed for chest pain.   ondansetron 8 MG disintegrating tablet Commonly known as:  ZOFRAN ODT Take 1 tablet (8 mg total) by mouth every 6 (six) hours as needed for nausea or vomiting.   ticagrelor 90 MG Tabs tablet Commonly known as:  BRILINTA Take 1 tablet (90 mg total) by mouth 2 (two) times daily.   zolpidem 5 MG tablet Commonly known as:  AMBIEN Take 1 tablet (5 mg total) by mouth at bedtime as needed for sleep.        DISCHARGE INSTRUCTIONS:   1. PCP f/u in 1-2 weeks 2. Patient being transferred to [facility] for further cardiac intervention  DIET:   Cardiac diet and low carb diet  ACTIVITY:   Activity as tolerated  OXYGEN:   Home Oxygen: No.  Oxygen Delivery: room air  DISCHARGE LOCATION:   Patient being transferred to [facility] for further cardiac intervention  If you experience worsening of your admission symptoms, develop shortness of breath, life threatening emergency, suicidal or homicidal thoughts you must seek medical attention immediately by calling 911 or calling your MD immediately  if symptoms less severe.  You Must read complete instructions/literature along with all the possible adverse reactions/side effects for all the Medicines you take and that have been prescribed to you. Take any new Medicines after you have completely understood and accpet all the possible adverse reactions/side effects.   Please note  You were cared for by a hospitalist during your hospital stay. If you have any questions about your discharge medications or the care you received while you were in the hospital after you are discharged, you can call the unit and asked to speak with the hospitalist on call if the hospitalist that took care of you is not available. Once you are discharged, your primary care physician will handle any further medical issues. Please note that NO REFILLS for any discharge medications will be authorized once you are discharged, as it is  imperative that you return to your primary care physician (or establish a relationship with a primary care physician if you do not have one) for your aftercare needs so that they can reassess your need for medications and monitor your lab values.    On the day of Discharge:  VITAL SIGNS:   Blood pressure 105/68, pulse 77, temperature 98.3 F (36.8 C), resp. rate 18, height 5\' 11"  (1.803 m), weight 94.3 kg (207 lb 12.8 oz), SpO2 96 %.  PHYSICAL EXAMINATION:    GENERAL:72 y.o.-year-old patient lying in the bed with no acute distress.  EYES: Pupils equal, round, reactive to light and accommodation. No scleral icterus. Extraocular muscles intact.  HEENT: Head atraumatic, normocephalic. Oropharynx and nasopharynx clear.  NECK: Supple, no jugular venous distention.  No thyroid enlargement, no tenderness.  LUNGS: Normal breath sounds bilaterally, no wheezing, rales,rhonchi or crepitation. No use of accessory muscles of respiration.Decreased bibasilar breath sounds CARDIOVASCULAR: S1, S2 normal. No murmurs, rubs, or gallops.  ABDOMEN: Soft, nontender, nondistended. Bowel sounds present. No organomegaly or mass.  EXTREMITIES: No pedal edema, cyanosis, or clubbing.Right radial catheterization site appears normal NEUROLOGIC: Cranial nerves II through XII are intact. Muscle strength 5/5 in all extremities. Sensation intact. Gait not checked.  PSYCHIATRIC: The patient is alert and oriented x 3.  SKIN: No obvious rash, lesion, or ulcer.     DATA REVIEW:   CBC Recent Labs  Lab 08/12/17 0411  WBC 14.7*  HGB 17.6  HCT 51.0  PLT 284    Chemistries  Recent Labs  Lab 08/10/17 0507 08/11/17 0527  NA 137 137  K 4.3 3.8  CL 100* 102  CO2 31 26  GLUCOSE 155* 151*  BUN 25* 19  CREATININE 1.15 0.99  CALCIUM 10.0 9.3  MG  --  2.3  AST 30  --   ALT 27  --   ALKPHOS 61  --   BILITOT 0.5  --      Microbiology Results  Results for orders placed or performed during the hospital encounter of 08/10/17  MRSA PCR Screening     Status: None   Collection Time: 08/10/17  7:16 AM  Result Value Ref Range Status   MRSA by PCR NEGATIVE NEGATIVE Final    Comment:        The GeneXpert MRSA Assay (FDA approved for NASAL specimens only), is one component of a comprehensive MRSA colonization surveillance program. It is not intended to diagnose MRSA infection nor to guide or monitor treatment for MRSA infections. Performed at [facility], [address], [place],  [address]     RADIOLOGY:  No results found.   Management plans discussed with the patient, family and they are in agreement.  CODE STATUS:     Code Status Orders  (From admission, onward)        Start     Ordered   08/10/17 0713  Full code  Continuous      08/10/17 0713    Code Status History    Date Active Date Inactive Code Status Order ID Comments User Context   01/25/2016 2344 01/27/2016 1643 Full Code [id number]  Lance Coon, MD Inpatient    Advance Directive Documentation     Most Recent Value  Type of Advance Directive  Healthcare Power of Attorney, Living will  Pre-existing out of facility DNR order (yellow form or pink MOST form)  -  "MOST" Form in Place?  -      [place]  THIS PATIENT: 39 minutes.    Gladstone Lighter M.D on 08/12/2017 at 10:04 AM  Between 7am to 6pm - Pager - [phone]  After 6pm go to [email or web address] - Proofreader  [facility] [place] [facility]  Office  [phone]  CC: Primary care physician; Juluis Pitch, MD   Note: This dictation was prepared with Dragon dictation along with smaller phrase technology. Any transcriptional errors that result from this process are unintentional.

## 2017-08-12 NOTE — Progress Notes (Signed)
Inpatient Diabetes Program Recommendations  AACE/ADA: New Consensus Statement on Inpatient Glycemic Control (2015)  Target Ranges:  Prepandial:   less than 140 mg/dL      Peak postprandial:   less than 180 mg/dL (1-2 hours)      Critically ill patients:  140 - 180 mg/dL   Results for Lingaf[place]ter, Henry [name] (MRN [id number]) as of 08/12/2017 13:17  Ref. Range 08/11/2017 08:10 08/11/2017 11:44 08/11/2017 16:22 08/11/2017 21:35  Glucose-Capillary Latest Ref Range: 65 - 99 mg/dL 214 (H) 155 (H) 139 (H) 144 (H)   Results for Kimpel, Henry [name] (MRN [id number]) as of 08/12/2017 13:17  Ref. Range 08/12/2017 08:08 08/12/2017 11:42  Glucose-Capillary Latest Ref Range: 65 - 99 mg/dL 163 (H) 130 (H)     Admit STEMI  History: DM  Home DM Meds: Levemir 40 units QAM/ 35 units QHS  Current Orders: Levemir 50 units BID       Novolog Sensitive Correction Scale/ SSI (0-9 units) TID AC + HS       Levemir increased yesterday.  Current A1c= 6.7%.   No recommendations today since Levemir increased.  CBGs well controlled at present.     --Will follow patient during hospitalization--  Wyn Quaker RN, MSN, CDE Diabetes Coordinator Inpatient Glycemic Control Team Team Pager: [phone] (8a-5p)

## 2017-08-12 NOTE — Progress Notes (Signed)
ANTICOAGULATION CONSULT NOTE   Pharmacy Consult for Heparin drip management Indication: STEMI  No Known Allergies  Patient Measurements: Height: 5\' 11"  (180.3 cm) Weight: 207 lb 12.8 oz (94.3 kg) IBW/kg (Calculated) : 75.3 Heparin Dosing Weight: 94.2 kg  Vital Signs: Temp: 97.5 F (36.4 C) (05/07 0437) Temp Source: Oral (05/07 0437) BP: 115/69 (05/07 0437) Pulse Rate: 74 (05/07 0437)  Labs: Recent Labs    08/10/17 0507 08/10/17 1054 08/10/17 1703 08/10/17 2311 08/11/17 0527 08/11/17 1219 08/11/17 2157 08/12/17 0411  HGB 16.5  --   --   --  16.8  --   --  17.6  HCT 46.6  --   --   --  48.8  --   --  51.0  PLT 253  --   --   --  271  --   --  284  APTT 28  --   --   --   --   --   --   --   LABPROT 12.2  --   --   --   --   --   --   --   INR 0.91  --   --   --   --   --   --   --   HEPARINUNFRC  --   --   --   --   --  0.25* 0.31 0.28*  CREATININE 1.15  --   --   --  0.99  --   --   --   TROPONINI 0.03* 0.52* 1.27* 2.24*  --   --   --   --     Estimated Creatinine Clearance: 80.2 mL/min (by C-G formula based on SCr of 0.99 mg/dL).   Medical History: Past Medical History:  Diagnosis Date  . Arthritis   . Cancer ([place])    skin  . Chronic kidney disease   . Diabetes mellitus without complication ([place])   . Glaucoma   . Hyperlipidemia   . Hypertension     Medications:  Scheduled:  . aspirin  81 mg Oral Daily  . atorvastatin  80 mg Oral q1800  . fluticasone  1 spray Each Nare Daily  . fluticasone furoate-vilanterol  1 puff Inhalation Daily  . insulin aspart  0-5 Units Subcutaneous QHS  . insulin aspart  0-9 Units Subcutaneous TID WC  . insulin detemir  50 Units Subcutaneous BID  . irbesartan  300 mg Oral Daily  . loratadine  10 mg Oral Daily  . metoprolol tartrate  25 mg Oral BID  . nicotine  14 mg Transdermal Daily  . sodium chloride flush  3 mL Intravenous Q12H  . ticagrelor  90 mg Oral BID   Infusions:  . sodium chloride 10 mL/hr at 08/10/17 2300   . sodium chloride    . heparin 1,500 Units/hr (08/12/17 0211)    Assessment: Pharmacy consulted for heparin drip management for 62 YOM admitted with STEMI. Pt currently receiving heparin at 1500 unit/hr. Patient received bolus and rate increase at ~ 1300 on 5/6.   Goal of Therapy:  Heparin level 0.3-0.7 units/ml Monitor platelets by anticoagulation protocol: Yes   Plan:  Continue heparin at 1500 units/hr. Will recheck anti-Xa level with am labs.   5/7 at 0411 heparin level 0.28 subtherapeutic (put in for pharmacy to follow up at 0800). Previous level at this rate was just therapeutic. Will give heparin bolus 1400 units IV x1 and increase drip to 1650 units/hr (=16.5 ml/hr). Recheck HL  in 8 h and CBC in AM. Hgb and plt count stable/improved from yesterday.   Pharmacy will continue to monitor and adjust per consult.   Rayna Sexton L 08/12/2017,7:49 AM

## 2017-08-12 NOTE — Progress Notes (Signed)
[facility] here to transport pt to [facility]. 

## 2017-08-12 NOTE — Plan of Care (Signed)
  Problem: Clinical Measurements: Goal: Will remain free from infection Outcome: Progressing   Problem: Safety: Goal: Ability to remain free from injury will improve Outcome: Progressing   

## 2017-08-13 ENCOUNTER — Ambulatory Visit (HOSPITAL_COMMUNITY): Admission: RE | Admit: 2017-08-13 | Payer: Medicare HMO | Source: Ambulatory Visit | Admitting: Cardiovascular Disease

## 2017-08-13 ENCOUNTER — Inpatient Hospital Stay (HOSPITAL_COMMUNITY)
Admission: AD | Disposition: A | Discharge status: Home / Self Care | Payer: Self-pay | Source: Other Acute Inpatient Hospital | Attending: Cardiology

## 2017-08-13 ENCOUNTER — Other Ambulatory Visit: Payer: Self-pay

## 2017-08-13 DIAGNOSIS — I2510 Atherosclerotic heart disease of native coronary artery without angina pectoris: Secondary | ICD-10-CM

## 2017-08-13 HISTORY — PX: CORONARY ATHERECTOMY: CATH118238

## 2017-08-13 HISTORY — PX: TEMPORARY PACEMAKER: CATH118268

## 2017-08-13 HISTORY — PX: CORONARY STENT INTERVENTION: CATH118234

## 2017-08-13 LAB — CBC
HCT: 48.6 % (ref 39.0–52.0)
Hemoglobin: 16.8 g/dL (ref 13.0–17.0)
MCH: 30.4 pg (ref 26.0–34.0)
MCHC: 34.6 g/dL (ref 30.0–36.0)
MCV: 88 fL (ref 78.0–100.0)
PLATELETS: 239 10*3/uL (ref 150–400)
RBC: 5.52 MIL/uL (ref 4.22–5.81)
RDW: 13.5 % (ref 11.5–15.5)
WBC: 13.4 10*3/uL — AB (ref 4.0–10.5)

## 2017-08-13 LAB — BASIC METABOLIC PANEL
Anion gap: 9 (ref 5–15)
BUN: 23 mg/dL — ABNORMAL HIGH (ref 6–20)
CALCIUM: 9.7 mg/dL (ref 8.9–10.3)
CO2: 24 mmol/L (ref 22–32)
Chloride: 106 mmol/L (ref 101–111)
Creatinine, Ser: 1.12 mg/dL (ref 0.61–1.24)
GFR calc Af Amer: >60 mL/min (ref >60)
Glucose, Bld: 97 mg/dL (ref 65–99)
POTASSIUM: 3.7 mmol/L (ref 3.5–5.1)
SODIUM: 139 mmol/L (ref 135–145)

## 2017-08-13 LAB — GLUCOSE, CAPILLARY
GLUCOSE-CAPILLARY: 96 mg/dL (ref 65–99)
GLUCOSE-CAPILLARY: 68 mg/dL (ref 65–99)
GLUCOSE-CAPILLARY: 167 mg/dL — AB (ref 65–99)
Glucose-Capillary: 107 mg/dL — ABNORMAL HIGH (ref 65–99)

## 2017-08-13 LAB — PROTIME-INR
INR: 1.05
Prothrombin Time: 13.6 seconds (ref 11.4–15.2)

## 2017-08-13 LAB — HEPARIN LEVEL (UNFRACTIONATED): HEPARIN UNFRACTIONATED: 0.66 [IU]/mL (ref 0.30–0.70)

## 2017-08-13 LAB — POCT ACTIVATED CLOTTING TIME: ACTIVATED CLOTTING TIME: 533 s

## 2017-08-13 SURGERY — CORONARY ATHERECTOMY
Anesthesia: LOCAL

## 2017-08-13 MED ORDER — BIVALIRUDIN BOLUS VIA INFUSION - CUPID
INTRAVENOUS | Status: DC | PRN
  Administered 2017-08-13: 69.825 mg via INTRAVENOUS

## 2017-08-13 MED ORDER — SODIUM CHLORIDE 0.9 % IV SOLN: 250.0000 mL | INTRAVENOUS | Status: DC | PRN

## 2017-08-13 MED ORDER — ENOXAPARIN SODIUM 40 MG/0.4ML ~~LOC~~ SOLN: 40.0000 mg | SUBCUTANEOUS | Status: DC

## 2017-08-13 MED ORDER — SODIUM CHLORIDE 0.9 % WEIGHT BASED INFUSION
1.0000 mL/kg/h | INTRAVENOUS | Status: DC
  Administered 2017-08-13: 250 mL via INTRAVENOUS

## 2017-08-13 MED ORDER — IOPAMIDOL (ISOVUE-370) INJECTION 76%
INTRAVENOUS | Status: AC
  Filled 2017-08-13: qty 125

## 2017-08-13 MED ORDER — SODIUM CHLORIDE 0.9 % IV SOLN
INTRAVENOUS | Status: DC | PRN
  Administered 2017-08-13: 1.75 mg/kg/h via INTRAVENOUS

## 2017-08-13 MED ORDER — VERAPAMIL HCL 2.5 MG/ML IV SOLN
INTRAVENOUS | Status: DC | PRN
  Administered 2017-08-13: 10 mL via INTRA_ARTERIAL

## 2017-08-13 MED ORDER — IOPAMIDOL (ISOVUE-370) INJECTION 76%
INTRAVENOUS | Status: DC | PRN
  Administered 2017-08-13: 125 mL

## 2017-08-13 MED ORDER — FENTANYL CITRATE (PF) 100 MCG/2ML IJ SOLN
INTRAMUSCULAR | Status: DC | PRN
  Administered 2017-08-13: 50 ug via INTRAVENOUS

## 2017-08-13 MED ORDER — FENTANYL CITRATE (PF) 100 MCG/2ML IJ SOLN
INTRAMUSCULAR | Status: AC
  Filled 2017-08-13: qty 2

## 2017-08-13 MED ORDER — LIDOCAINE HCL (PF) 1 % IJ SOLN
INTRAMUSCULAR | Status: DC | PRN
  Administered 2017-08-13: 15 mL
  Administered 2017-08-13: 2 mL

## 2017-08-13 MED ORDER — IOPAMIDOL (ISOVUE-370) INJECTION 76%
INTRAVENOUS | Status: AC
  Filled 2017-08-13: qty 50

## 2017-08-13 MED ORDER — VERAPAMIL HCL 2.5 MG/ML IV SOLN
INTRAVENOUS | Status: AC
  Filled 2017-08-13: qty 2

## 2017-08-13 MED ORDER — HEPARIN (PORCINE) IN NACL 1000-0.9 UT/500ML-% IV SOLN
INTRAVENOUS | Status: AC
  Filled 2017-08-13: qty 500

## 2017-08-13 MED ORDER — SODIUM CHLORIDE 0.9% FLUSH: 3.0000 mL | Freq: Two times a day (BID) | INTRAVENOUS | Status: DC

## 2017-08-13 MED ORDER — MIDAZOLAM HCL 2 MG/2ML IJ SOLN
INTRAMUSCULAR | Status: DC | PRN
  Administered 2017-08-13 (×2): 1 mg via INTRAVENOUS

## 2017-08-13 MED ORDER — HEPARIN (PORCINE) IN NACL 2-0.9 UNITS/ML
INTRAMUSCULAR | Status: AC | PRN
  Administered 2017-08-13 (×2): 500 mL

## 2017-08-13 MED ORDER — NITROGLYCERIN 1 MG/10 ML FOR IR/CATH LAB
INTRA_ARTERIAL | Status: DC | PRN
  Administered 2017-08-13: 200 ug via INTRACORONARY

## 2017-08-13 MED ORDER — VIPERSLIDE LUBRICANT OPTIME
TOPICAL | Status: DC | PRN
  Administered 2017-08-13: 500 mL via SURGICAL_CAVITY

## 2017-08-13 MED ORDER — SODIUM CHLORIDE 0.9 % WEIGHT BASED INFUSION
1.0000 mL/kg/h | INTRAVENOUS | Status: DC
  Administered 2017-08-13 (×2): 1 mL/kg/h via INTRAVENOUS

## 2017-08-13 MED ORDER — SODIUM CHLORIDE 0.9% FLUSH: 3.0000 mL | INTRAVENOUS | Status: DC | PRN

## 2017-08-13 MED ORDER — LIDOCAINE HCL (PF) 1 % IJ SOLN
INTRAMUSCULAR | Status: AC
  Filled 2017-08-13: qty 30

## 2017-08-13 MED ORDER — NITROGLYCERIN 1 MG/10 ML FOR IR/CATH LAB
INTRA_ARTERIAL | Status: AC
  Filled 2017-08-13: qty 10

## 2017-08-13 MED ORDER — SODIUM CHLORIDE 0.9 % WEIGHT BASED INFUSION
3.0000 mL/kg/h | INTRAVENOUS | Status: DC
  Administered 2017-08-13: 2.997 mL/kg/h via INTRAVENOUS

## 2017-08-13 MED ORDER — BIVALIRUDIN TRIFLUOROACETATE 250 MG IV SOLR
INTRAVENOUS | Status: AC
  Filled 2017-08-13: qty 250

## 2017-08-13 MED ORDER — ANGIOPLASTY BOOK
Freq: Once | Status: AC
  Administered 2017-08-13: 22:00:00
  Filled 2017-08-13: qty 1

## 2017-08-13 MED ORDER — ASPIRIN 81 MG PO CHEW
81.0000 mg | CHEWABLE_TABLET | ORAL | Status: AC
  Administered 2017-08-13: 81 mg via ORAL
  Filled 2017-08-13: qty 1

## 2017-08-13 MED ORDER — SODIUM CHLORIDE 0.9% FLUSH
3.0000 mL | Freq: Two times a day (BID) | INTRAVENOUS | Status: DC
  Administered 2017-08-14: 01:00:00 3 mL via INTRAVENOUS

## 2017-08-13 MED ORDER — HEART ATTACK BOUNCING BOOK
Freq: Once | Status: AC
  Administered 2017-08-13: 22:00:00
  Filled 2017-08-13: qty 1

## 2017-08-13 MED ORDER — MIDAZOLAM HCL 2 MG/2ML IJ SOLN
INTRAMUSCULAR | Status: AC
  Filled 2017-08-13: qty 2

## 2017-08-13 SURGICAL SUPPLY — 28 items
BALLN MINITREK OTW 2.0X12 (BALLOONS) ×2
BALLN SAPPHIRE 3.0X15 (BALLOONS) ×2
BALLN SAPPHIRE ~~LOC~~ 4.0X18 (BALLOONS) ×1 IMPLANT
BALLOON MINITREK OTW 2.0X12 (BALLOONS) IMPLANT
BALLOON SAPPHIRE 3.0X15 (BALLOONS) IMPLANT
CABLE ADAPT CONN TEMP 6FT (ADAPTER) ×1 IMPLANT
CATH LAUNCHER 6FR AL.75 (CATHETERS) ×1 IMPLANT
CATH S G BIP PACING (SET/KITS/TRAYS/PACK) ×1 IMPLANT
CATH TELEPORT (CATHETERS) ×1 IMPLANT
CROWN DIAMONDBACK CLASSIC 1.25 (BURR) ×1 IMPLANT
DEVICE RAD COMP TR BAND LRG (VASCULAR PRODUCTS) ×1 IMPLANT
ELECT DEFIB PAD ADLT CADENCE (PAD) ×1 IMPLANT
GLIDESHEATH SLEND SS 6F .021 (SHEATH) ×1 IMPLANT
GUIDEWIRE INQWIRE 1.5J.035X260 (WIRE) IMPLANT
INQWIRE 1.5J .035X260CM (WIRE) ×2
KIT ENCORE 26 ADVANTAGE (KITS) ×1 IMPLANT
KIT HEART LEFT (KITS) ×2 IMPLANT
KIT HEMO VALVE WATCHDOG (MISCELLANEOUS) ×1 IMPLANT
LUBRICANT VIPERSLIDE CORONARY (MISCELLANEOUS) ×1 IMPLANT
PACK CARDIAC CATHETERIZATION (CUSTOM PROCEDURE TRAY) ×2 IMPLANT
SHEATH PINNACLE 6F 10CM (SHEATH) ×1 IMPLANT
SLEEVE REPOSITIONING LENGTH 30 (MISCELLANEOUS) ×1 IMPLANT
STENT RESOLUTE ONYX 3.5X34 (Permanent Stent) ×1 IMPLANT
TRANSDUCER W/STOPCOCK (MISCELLANEOUS) ×2 IMPLANT
TUBING CIL FLEX 10 FLL-RA (TUBING) ×2 IMPLANT
WIRE RUNTHROUGH .014X180CM (WIRE) ×1 IMPLANT
WIRE RUNTHROUGH .014X300CM (WIRE) ×1 IMPLANT
WIRE VIPER ADVANCE COR .012TIP (WIRE) ×1 IMPLANT

## 2017-08-13 NOTE — Interval H&P Note (Signed)
Cath Lab Visit (complete for each Cath Lab visit)  Clinical Evaluation Leading to the Procedure:   ACS: Yes.    Non-ACS:  n/a   History and Physical Interval Note:  08/13/2017 12:17 PM  Henry [name]  has presented today for surgery, with the diagnosis of cad  The various methods of treatment have been discussed with the patient and family. After consideration of risks, benefits and other options for treatment, the patient has consented to  Procedure(s): CORONARY ATHERECTOMY (N/A) as a surgical intervention .  The patient's history has been reviewed, patient examined, no change in status, stable for surgery.  I have reviewed the patient's chart and labs.  Questions were answered to the patient's satisfaction.     Kathlyn Sacramento

## 2017-08-13 NOTE — Progress Notes (Signed)
Per insurance check on Brilinta  # 3  S/W AKIERA  @ [employer] # [phone]    ZYTMMITV 47 MG BID  COVER- YES  CO-PAY-$ 45.00  TIER- 3 [place] -YES [facility]

## 2017-08-13 NOTE — H&P (View-Only) (Signed)
Progress Note  Patient Name: Henry [name] Date of Encounter: 08/13/2017  Primary Cardiologist:  Minta Balsam, MD   Subjective   72 year old gentleman with a history of diabetes mellitus, COPD, chronic kidney disease, hypertension who was admitted to [facility] hospital with an ST segment elevation myocardial infarction. Had primary PCI to the LAD.  He also has a proximal/mid RCA that is heavily calcified.  He is transferred to [facility] for rotational atherectomy and stenting of the RCA.  He is currently pain-free and doing well.  Inpatient Medications    Scheduled Meds: . aspirin EC  81 mg Oral Daily  . atorvastatin  80 mg Oral q1800  . fluticasone  1 spray Each Nare Daily  . fluticasone furoate-vilanterol  1 puff Inhalation Daily  . insulin aspart  0-15 Units Subcutaneous TID WC  . insulin detemir  50 Units Subcutaneous BID  . irbesartan  300 mg Oral Daily  . loratadine  10 mg Oral Daily  . metoprolol tartrate  25 mg Oral BID  . nicotine  14 mg Transdermal Daily  . sodium chloride flush  3 mL Intravenous Q12H  . ticagrelor  90 mg Oral BID   Continuous Infusions: . sodium chloride    . sodium chloride 1 mL/kg/hr (08/13/17 1030)  . heparin 1,650 Units/hr (08/13/17 0528)   PRN Meds: sodium chloride, acetaminophen, nitroGLYCERIN, ondansetron (ZOFRAN) IV, sodium chloride flush   Vital Signs    Vitals:   08/13/17 0242 08/13/17 0542 08/13/17 0757 08/13/17 0900  BP:  116/69    Pulse: 63 71  80  Resp: (!) 21 19    Temp:  (!) 97.5 F (36.4 C)    TempSrc:  Oral    SpO2: 98% 97% 98%   Weight: 205 lb 4.8 oz (93.1 kg)     Height:        Intake/Output Summary (Last 24 hours) at 08/13/2017 1153 Last data filed at 08/13/2017 0300 Gross per 24 hour  Intake 401.98 ml  Output -  Net 401.98 ml   Filed Weights   08/12/17 1707 08/13/17 0242  Weight: 205 lb 14.6 oz (93.4 kg) 205 lb 4.8 oz (93.1 kg)    Telemetry    NSR - Personally Reviewed  ECG     NSR  - Personally Reviewed  Physical Exam   GEN: No acute distress.   Neck: No JVD Cardiac: RRR, no murmurs, rubs, or gallops.  Respiratory: Clear to auscultation bilaterally. GI: Soft, nontender, non-distended  MS: No edema; No deformity. Neuro:  Nonfocal  Psych: Normal affect   Labs    Chemistry Recent Labs  Lab 08/10/17 0507 08/11/17 0527 08/12/17 1730 08/13/17 0422  NA 137 137 139 139  K 4.3 3.8 4.1 3.7  CL 100* 102 103 106  CO2 31 26 26 24   GLUCOSE 155* 151* 132* 97  BUN 25* 19 21* 23*  CREATININE 1.15 0.99 1.22 1.12  CALCIUM 10.0 9.3 10.0 9.7  PROT 7.1  --  6.8  --   ALBUMIN 4.1  --  3.8  --   AST 30  --  39  --   ALT 27  --  35  --   ALKPHOS 61  --  56  --   BILITOT 0.5  --  0.7  --   GFRNONAA >60 >60 58* >60  GFRAA >60 >60 >60 >60  ANIONGAP 6 9 10 9      Hematology Recent Labs  Lab 08/11/17 0527 08/12/17 0411 08/13/17  0422  WBC 13.3* 14.7* 13.4*  RBC 5.51 5.75 5.52  HGB 16.8 17.6 16.8  HCT 48.8 51.0 48.6  MCV 88.7 88.6 88.0  MCH 30.5 30.6 30.4  MCHC 34.4 34.5 34.6  RDW 14.2 13.8 13.5  PLT 271 284 239    Cardiac Enzymes Recent Labs  Lab 08/10/17 0507 08/10/17 1054 08/10/17 1703 08/10/17 2311  TROPONINI 0.03* 0.52* 1.27* 2.24*   No results for input(s): TROPIPOC in the last 168 hours.   BNPNo results for input(s): BNP, PROBNP in the last 168 hours.   DDimer No results for input(s): DDIMER in the last 168 hours.   Radiology    No results found.  Cardiac Studies     Patient Profile     72 y.o. male   Assessment & Plan    1.  CAD  : He status post PCI of his LAD several days ago.  He now has a tight calcified stenosis in his mid right coronary artery.  He was transferred to [name] for atherectomy and stent placement. He is currently pain-free.  For questions or updates, please contact [place] Please consult [email or web address] for contact info under Cardiology/STEMI.      Signed, Mertie Moores, MD    08/13/2017, 11:53 AM

## 2017-08-13 NOTE — Progress Notes (Signed)
Progress Note  Patient Name: Henry [name] Date of Encounter: 08/13/2017  Primary Cardiologist:  Minta Balsam, MD   Subjective   72 year old gentleman with a history of diabetes mellitus, COPD, chronic kidney disease, hypertension who was admitted to [facility] hospital with an ST segment elevation myocardial infarction. Had primary PCI to the LAD.  He also has a proximal/mid RCA that is heavily calcified.  He is transferred to [facility] for rotational atherectomy and stenting of the RCA.  He is currently pain-free and doing well.  Inpatient Medications    Scheduled Meds: . aspirin EC  81 mg Oral Daily  . atorvastatin  80 mg Oral q1800  . fluticasone  1 spray Each Nare Daily  . fluticasone furoate-vilanterol  1 puff Inhalation Daily  . insulin aspart  0-15 Units Subcutaneous TID WC  . insulin detemir  50 Units Subcutaneous BID  . irbesartan  300 mg Oral Daily  . loratadine  10 mg Oral Daily  . metoprolol tartrate  25 mg Oral BID  . nicotine  14 mg Transdermal Daily  . sodium chloride flush  3 mL Intravenous Q12H  . ticagrelor  90 mg Oral BID   Continuous Infusions: . sodium chloride    . sodium chloride 1 mL/kg/hr (08/13/17 1030)  . heparin 1,650 Units/hr (08/13/17 0528)   PRN Meds: sodium chloride, acetaminophen, nitroGLYCERIN, ondansetron (ZOFRAN) IV, sodium chloride flush   Vital Signs    Vitals:   08/13/17 0242 08/13/17 0542 08/13/17 0757 08/13/17 0900  BP:  116/69    Pulse: 63 71  80  Resp: (!) 21 19    Temp:  (!) 97.5 F (36.4 C)    TempSrc:  Oral    SpO2: 98% 97% 98%   Weight: 205 lb 4.8 oz (93.1 kg)     Height:        Intake/Output Summary (Last 24 hours) at 08/13/2017 1153 Last data filed at 08/13/2017 0300 Gross per 24 hour  Intake 401.98 ml  Output -  Net 401.98 ml   Filed Weights   08/12/17 1707 08/13/17 0242  Weight: 205 lb 14.6 oz (93.4 kg) 205 lb 4.8 oz (93.1 kg)    Telemetry    NSR - Personally Reviewed  ECG     NSR  - Personally Reviewed  Physical Exam   GEN: No acute distress.   Neck: No JVD Cardiac: RRR, no murmurs, rubs, or gallops.  Respiratory: Clear to auscultation bilaterally. GI: Soft, nontender, non-distended  MS: No edema; No deformity. Neuro:  Nonfocal  Psych: Normal affect   Labs    Chemistry Recent Labs  Lab 08/10/17 0507 08/11/17 0527 08/12/17 1730 08/13/17 0422  NA 137 137 139 139  K 4.3 3.8 4.1 3.7  CL 100* 102 103 106  CO2 31 26 26 24   GLUCOSE 155* 151* 132* 97  BUN 25* 19 21* 23*  CREATININE 1.15 0.99 1.22 1.12  CALCIUM 10.0 9.3 10.0 9.7  PROT 7.1  --  6.8  --   ALBUMIN 4.1  --  3.8  --   AST 30  --  39  --   ALT 27  --  35  --   ALKPHOS 61  --  56  --   BILITOT 0.5  --  0.7  --   GFRNONAA >60 >60 58* >60  GFRAA >60 >60 >60 >60  ANIONGAP 6 9 10 9      Hematology Recent Labs  Lab 08/11/17 0527 08/12/17 0411 08/13/17  0422  WBC 13.3* 14.7* 13.4*  RBC 5.51 5.75 5.52  HGB 16.8 17.6 16.8  HCT 48.8 51.0 48.6  MCV 88.7 88.6 88.0  MCH 30.5 30.6 30.4  MCHC 34.4 34.5 34.6  RDW 14.2 13.8 13.5  PLT 271 284 239    Cardiac Enzymes Recent Labs  Lab 08/10/17 0507 08/10/17 1054 08/10/17 1703 08/10/17 2311  TROPONINI 0.03* 0.52* 1.27* 2.24*   No results for input(s): TROPIPOC in the last 168 hours.   BNPNo results for input(s): BNP, PROBNP in the last 168 hours.   DDimer No results for input(s): DDIMER in the last 168 hours.   Radiology    No results found.  Cardiac Studies     Patient Profile     72 y.o. male   Assessment & Plan    1.  CAD  : He status post PCI of his LAD several days ago.  He now has a tight calcified stenosis in his mid right coronary artery.  He was transferred to [facility] for atherectomy and stent placement. He is currently pain-free.  For questions or updates, please contact [place] Please consult [email or web address] for contact info under Cardiology/STEMI.      Signed, Mertie Moores, MD    08/13/2017, 11:53 AM

## 2017-08-13 NOTE — Care Management Note (Addendum)
Case Management Note  Patient Details  Name: Henry [name] MRN: [id number] Date of Birth: 1945/11/30  Subjective/Objective:    From home, pta indep, he goes to the [facility] clinic number 2,PCP is Lisabeth Pick fax number is [phone],  NCM will fax dc summary at dc.  NCM informed patient the RN will give him the 30 day savings coupon for the brilinta and that copay came up as 45.00, but he may get free from [place] clinic.  NCM spoke with patient , he states he can take his own medications he does not need a HHRN.   NCM referred patient to [facility], [place] will see him prior to dc.         Action/Plan: DC home when ready.  Expected Discharge Date:  08/14/17               Expected Discharge Plan:  Home/Self Care  In-House Referral:     Discharge planning Services  CM Consult, Medication Assistance  Post Acute Care Choice:    Choice offered to:     DME Arranged:    DME Agency:     HH Arranged:    HH Agency:     Status of Service:  Completed, signed off  If discussed at [employer] of Stay Meetings, dates discussed:    Additional Comments:  Zenon Mayo, RN 08/13/2017, 2:41 PM

## 2017-08-13 NOTE — Progress Notes (Signed)
ANTICOAGULATION CONSULT NOTE   Pharmacy Consult for Heparin  Indication: STEMI  No Known Allergies  Patient Measurements: Height: 5\' 11"  (180.3 cm) Weight: 205 lb 4.8 oz (93.1 kg) IBW/kg (Calculated) : 75.3 Heparin Dosing Weight: 94.2 kg  Vital Signs: Temp: 97.5 F (36.4 C) (05/08 0542) Temp Source: Oral (05/08 0542) BP: 116/69 (05/08 0542) Pulse Rate: 71 (05/08 0542)  Labs: Recent Labs    08/10/17 1054 08/10/17 1703 08/10/17 2311  08/11/17 0527  08/12/17 0411 08/12/17 1730 08/13/17 0422  HGB  --   --   --    < > 16.8  --  17.6  --  16.8  HCT  --   --   --   --  48.8  --  51.0  --  48.6  PLT  --   --   --   --  271  --  284  --  239  LABPROT  --   --   --   --   --   --   --   --  13.6  INR  --   --   --   --   --   --   --   --  1.05  HEPARINUNFRC  --   --   --   --   --    < > 0.28* <0.10* 0.66  CREATININE  --   --   --   --  0.99  --   --  1.22  --   TROPONINI 0.52* 1.27* 2.24*  --   --   --   --   --   --    < > = values in this interval not displayed.    Estimated Creatinine Clearance: 64.7 mL/min (by C-G formula based on SCr of 1.22 mg/dL).   Assessment: Pharmacy consulted for heparin drip management for 54 YOM admitted with STEMI. The patient was transferred from [facility] to [facility] on 5/7 already receiving Heparin at a drip rate of 1650 units/hr - the rate as noted to be increased around 0900 this AM.   5/8 AM update: heparin level is therapeutic x 1   Goal of Therapy:  Heparin level 0.3-0.7 units/ml Monitor platelets by anticoagulation protocol: Yes   Plan:  Cont heparin at 1650 units/hr 1200 HL  Narda Bonds, PharmD, BCPS Clinical Pharmacist Phone: [phone]

## 2017-08-13 NOTE — Progress Notes (Signed)
TR BAND REMOVAL  LOCATION:    right radial  DEFLATED PER PROTOCOL:    Yes.    TIME BAND OFF / DRESSING APPLIED:    1830   SITE UPON ARRIVAL:    Level 0  SITE AFTER BAND REMOVAL:    Level 0  CIRCULATION SENSATION AND MOVEMENT:    Within Normal Limits   Yes.    COMMENTS:   Rechecked site at 1900 with no change in assessment

## 2017-08-14 ENCOUNTER — Encounter (HOSPITAL_COMMUNITY): Payer: Self-pay | Admitting: Cardiovascular Disease

## 2017-08-14 DIAGNOSIS — Z95820 Peripheral vascular angioplasty status with implants and grafts: Secondary | ICD-10-CM

## 2017-08-14 HISTORY — DX: Peripheral vascular angioplasty status with implants and grafts: Z95.820

## 2017-08-14 LAB — CBC
HCT: 47.1 % (ref 39.0–52.0)
HEMOGLOBIN: 16.1 g/dL (ref 13.0–17.0)
MCH: 30.2 pg (ref 26.0–34.0)
MCHC: 34.2 g/dL (ref 30.0–36.0)
MCV: 88.4 fL (ref 78.0–100.0)
Platelets: 255 10*3/uL (ref 150–400)
RBC: 5.33 MIL/uL (ref 4.22–5.81)
RDW: 13.7 % (ref 11.5–15.5)
WBC: 14.4 10*3/uL — AB (ref 4.0–10.5)

## 2017-08-14 LAB — GLUCOSE, CAPILLARY
GLUCOSE-CAPILLARY: 67 mg/dL (ref 65–99)
GLUCOSE-CAPILLARY: 103 mg/dL — AB (ref 65–99)

## 2017-08-14 LAB — BASIC METABOLIC PANEL
ANION GAP: 6 (ref 5–15)
BUN: 23 mg/dL — ABNORMAL HIGH (ref 6–20)
CALCIUM: 9.3 mg/dL (ref 8.9–10.3)
CO2: 25 mmol/L (ref 22–32)
CREATININE: 1.18 mg/dL (ref 0.61–1.24)
Chloride: 109 mmol/L (ref 101–111)
GFR calc non Af Amer: >60 mL/min (ref >60)
Glucose, Bld: 51 mg/dL — ABNORMAL LOW (ref 65–99)
Potassium: 3.5 mmol/L (ref 3.5–5.1)
SODIUM: 140 mmol/L (ref 135–145)

## 2017-08-14 MED ORDER — IRBESARTAN 300 MG PO TABS: 300.0000 mg | ORAL_TABLET | Freq: Every day | ORAL | 2 refills | Status: DC

## 2017-08-14 MED ORDER — ASPIRIN 81 MG PO TBEC: 81.0000 mg | DELAYED_RELEASE_TABLET | Freq: Every day | ORAL | Status: AC

## 2017-08-14 MED ORDER — ATORVASTATIN CALCIUM 80 MG PO TABS: 80.0000 mg | ORAL_TABLET | Freq: Every day | ORAL | 2 refills | Status: DC

## 2017-08-14 MED ORDER — METOPROLOL TARTRATE 25 MG PO TABS: 25.0000 mg | ORAL_TABLET | Freq: Two times a day (BID) | ORAL | 2 refills | Status: AC

## 2017-08-14 MED ORDER — NICOTINE 14 MG/24HR TD PT24: 14.0000 mg | MEDICATED_PATCH | Freq: Every day | TRANSDERMAL | 0 refills | Status: DC

## 2017-08-14 MED ORDER — NITROGLYCERIN 0.4 MG SL SUBL: 0.4000 mg | SUBLINGUAL_TABLET | SUBLINGUAL | 2 refills | Status: DC | PRN

## 2017-08-14 MED ORDER — TICAGRELOR 90 MG PO TABS: 90.0000 mg | ORAL_TABLET | Freq: Two times a day (BID) | ORAL | 0 refills | Status: DC

## 2017-08-14 MED FILL — Heparin Sod (Porcine)-NaCl IV Soln 1000 Unit/500ML-0.9%: INTRAVENOUS | Qty: 1000 | Status: AC

## 2017-08-14 NOTE — Progress Notes (Signed)
Discharge instructions reviewed with patient.  These included the following:  Prescriptions, MD appointments, when to call the MD, symptoms of angina and how to treat, radial site care, dietary considerations, medications side effects, Brilinta card, etc.  Comprehension of information confirmed utilizing the "teach back" technique.  Patient discharged to home via private vehicle accompanied by friend.  Ambulated to exit as per his preference escorted by nurse tech.

## 2017-08-14 NOTE — Progress Notes (Signed)
CARDIAC REHAB PHASE I   PRE:  Rate/Rhythm: 84 SR with PACs    BP: sitting 117/59    SaO2:   MODE:  Ambulation: 800 ft   POST:  Rate/Rhythm: 103 ST with PACs    BP: sitting 138/73     SaO2:   Tolerated well. Has many PACs but stable. Ed completed, good reception. Pt sts he is going home and throwing away his cigarettes. Declined fake cigarette but thinks he will buy a nicotine patch. He understands the importance of Brilinta. Will refer to [place]. Good reception. [place], ACSM 08/14/2017 9:56 AM

## 2017-08-14 NOTE — Discharge Summary (Addendum)
Discharge Summary    Patient ID: Henry [name],  MRN: [id number], DOB/AGE: 72-Nov-1947 72 y.o.  Admit date: 08/12/2017 Discharge date: 08/14/2017  Primary Care Provider: Juluis Pitch Primary Cardiologist: Dr. Saralyn Pilar  Discharge Diagnoses    Principal Problem:   Acute ST elevation myocardial infarction (STEMI) involving left anterior descending (LAD) coronary artery [facility]) Active Problems:   S/P angioplasty with stent 08/10/17 emergently to LAD ,  08/13/17 atherectomy and stent to pRCA    HTN (hypertension)   HLD (hyperlipidemia)   ST elevation myocardial infarction involving left anterior descending (LAD) coronary artery (HCC)   CAD in native artery   Leukocytosis   Insulin dependent diabetes mellitus (HCC)   Allergies No Known Allergies  Diagnostic Studies/Procedures    Cardiac cath 08/13/17 Procedures   CORONARY STENT INTERVENTION  CORONARY ATHERECTOMY  TEMPORARY PACEMAKER  Conclusion     Ost 1st Diag lesion is 70% stenosed.  Previously placed Prox LAD drug eluting stent is widely patent.  Balloon angioplasty was performed.  Previously placed Prox LAD to Mid LAD drug eluting stent is widely patent.  Balloon angioplasty was performed.  Prox Cx to Mid Cx lesion is 30% stenosed.  Prox RCA-1 lesion is 50% stenosed.  Prox RCA-2 lesion is 99% stenosed.  Dist RCA lesion is 30% stenosed.  Post intervention, there is a 0% residual stenosis.  Post intervention, there is a 0% residual stenosis.  A stent was successfully placed.   Successful orbital atherectomy and drug-eluting stent placement to the proximal right coronary artery.  Recommendations: Continue dual antiplatelet therapy for at least one year.  Aggressive treatment of risk factors. Overall difficult procedure due to difficulty getting good support from the guiding catheter as well as heavy calcifications and tortuosity.    ECHO 08/10/17 Study Conclusions  - Procedure narrative:  Transthoracic echocardiography. Image   quality was suboptimal. The study was technically difficult, as a   result of poor acoustic windows and poor sound wave transmission. - Left ventricle: Although no diagnostic regional wall motion   abnormality was identified, this possibility cannot be completely   excluded on the basis of this study. The study is not technically   sufficient to allow evaluation of LV diastolic function.  Impressions:  - Very limited views. EF appears mildy reduced with estimated ef of   40-50% but wall motion is very difficult to assess in this study.  Cath 08/10/17 Conclusion     Prox RCA-1 lesion is 50% stenosed.  Prox RCA-2 lesion is 99% stenosed.  Dist RCA lesion is 30% stenosed.  Prox Cx to Mid Cx lesion is 30% stenosed.  Ost 1st Diag lesion is 70% stenosed.  Prox LAD to Mid LAD lesion is 99% stenosed.  Prox LAD lesion is 70% stenosed.  A drug-eluting stent was successfully placed using a STENT SIERRA 2.50 X 28 MM.  Post intervention, there is a 0% residual stenosis.  A drug-eluting stent was successfully placed using a STENT SIERRA 2.50 X 12 MM.  Post intervention, there is a 0% residual stenosis.  The left ventricular systolic function is normal.  LV end diastolic pressure is normal.  The left ventricular ejection fraction is 55-65% by visual estimate.  There is no mitral valve regurgitation.  1. Acute anterior STEMI secondary to thrombotic sub-total occlusion of the mid LAD 2. Successful PTCA/DES x 2 mid LAD 3. The LAD is a large caliber vessel proximally but in the mid vessel tapers to a smaller caliber vessel and it  barely reaches the apex. The mid LAD has a thrombotic, sub-total occlusion with sluggish flow down the LAD. The first Diagonal branch is small in caliber with 70% proximal stenosis.  4. The Circumflex is a moderate to large caliber vessel with mild mid stenosis.  5. The RCA is a large dominant vessel. The entire  proximal and mid vessel is heavily calcified. The proximal vessel has diffuse 50% stenosis The mid vessel has a focal, heavily calcified 99% stenosis.  6. Preserved LV systolic function with subtle wall motion abnormality anterior wall  Recommendations: He will be admitted to the ICU. I have spoken to Dr. Marcille Blanco from the [facility] who will admit. He will be continued on ASA and Brilinta. He will need DAPT for one year. I will start a beta blocker and high intensity statin. I will continue Aggrastat for 3 hours post PCI (thombus seen in distal LAD post angioplasty). Echo later today. He has been see in the past by Dr. Saralyn Pilar. I will ask [facility] to follow him here in the hospital. He will need staged PCI of his RCA later this week. Given the heavy calcification, the lesion will likely need to be treated with a scoring balloon or orbital atherectomy prior to stenting.     Patient Profile     72 y.o. male gentleman with a history of diabetes mellitus, COPD, chronic kidney disease, hypertension, PACs and neg nuc 08/2016 was admitted to [facility] hospital 08/10/17 with an ST segment elevation myocardial infarction. Had primary PCI to the LAD.  He also has a proximal/mid RCA that was heavily calcified.  He was transferred to [facility] for rotational atherectomy and stenting of the RCA done 08/13/17.  _____________   History of Present Illness     Pt admitted to [facility] with STEMI Ant. Wall 08/10/17 with ST elevation in ant wall on EKG.  He had had increased SOB few days prior to admit.  Day of admit he woke from sleep with significant chest pain heaviness and discomfort.  He called EMS and on arrival underwent emergent PTCA with DES X 2 to mLAD.   He was admitted to the ICU.  [facility] cards followed him at [place].   Pt with hx of PACs, DM-2 insulin dependent , + tobacco use, HTN and CKD.     Hospital Course     Consultants:  cardiology and [facility] staff.   Pt was placed on BB, high dose of statin, SSI, echo with mild decrease in EF 40-45%.  Pt had no further chest pain.  Pt progressed and with residual disease of RCA with heavy calcification arrangements were made, prior to discharge pt transferred to [facility] and undergo orbital atherectomy and stent to RCA not available at [facility].    This was arranged and pt transferred to [facility] 08/12/17 for procedure -listed above.  Pt tolerated well.  By the AM of 08/14/17 pt was walking with cardiac rehab without problems.  No chest pain and only his chronic SOB.  He will follow up with Dr. Saralyn Pilar in 7-10 days. He has been seen by Dr. Acie Fredrickson and found stable for discharge.  Pt asks for nursing help at home, have asked care manager for RN to help with meds and evaluate.     _____________  Discharge Vitals Blood pressure 110/63, pulse 67, temperature 98.1 F (36.7 C), temperature source Oral, resp. rate 18, height 5\' 11"  (1.803 m), weight 206 lb 9.1 oz (93.7  kg), SpO2 95 %.  Filed Weights   08/12/17 1707 08/13/17 0242 08/14/17 0311  Weight: 205 lb 14.6 oz (93.4 kg) 205 lb 4.8 oz (93.1 kg) 206 lb 9.1 oz (93.7 kg)    Labs & Radiologic Studies    CBC Recent Labs    08/13/17 0422 08/14/17 0306  WBC 13.4* 14.4*  HGB 16.8 16.1  HCT 48.6 47.1  MCV 88.0 88.4  PLT 239 409   Basic Metabolic Panel Recent Labs    08/13/17 0422 08/14/17 0306  NA 139 140  K 3.7 3.5  CL 106 109  CO2 24 25  GLUCOSE 97 51*  BUN 23* 23*  CREATININE 1.12 1.18  CALCIUM 9.7 9.3   Liver Function Tests Recent Labs    08/12/17 1730  AST 39  ALT 35  ALKPHOS 56  BILITOT 0.7  PROT 6.8  ALBUMIN 3.8   No results for input(s): LIPASE, AMYLASE in the last 72 hours. Cardiac Enzymes No results for input(s): CKTOTAL, CKMB, CKMBINDEX, TROPONINI in the last 72 hours. BNP Invalid input(s): POCBNP D-Dimer No results for input(s): DDIMER in the last 72 hours. Hemoglobin A1C No results for  input(s): HGBA1C in the last 72 hours. Fasting Lipid Panel No results for input(s): CHOL, HDL, LDLCALC, TRIG, CHOLHDL, LDLDIRECT in the last 72 hours. Thyroid Function Tests No results for input(s): TSH, T4TOTAL, T3FREE, THYROIDAB in the last 72 hours.  Invalid input(s): FREET3 _____________  No results found. Disposition   Pt is being discharged home today in good condition.  Follow-up Plans & Appointments   Call [facility] at [phone] or Dr. Saralyn Pilar if any bleeding, swelling or drainage at cath site.  May shower, no tub baths for 48 hours for groin sticks. No lifting over 5 pounds for 14 days.  No Driving for 14 days  Take 1 NTG, under your tongue, while sitting.  If no relief of pain may repeat NTG, one tab every 5 minutes up to 3 tablets total over 15 minutes.  If no relief CALL 911.  If you have dizziness/lightheadness  while taking NTG, stop taking and call 911.         Do not stop Asprin and Brilinta stopping could cause a heart attack.  Both meds help keep stents open.  Heart Healthy Diabetic Diet.   Follow-up Information    Isaias Cowman, MD Follow up on 08/21/2017.   Specialty:  Cardiology Why:  at 10:15 AM Contact information: [facility] [place] [address] [phone]          Discharge Instructions    Amb Referral to Cardiac Rehabilitation   Complete by:  As directed    Diagnosis:   Coronary Stents PTCA STEMI        Discharge Medications   Allergies as of 08/14/2017   No Known Allergies     Medication List    STOP taking these medications   heparin 100-0.45 UNIT/ML-% infusion   meloxicam 15 MG tablet Commonly known as:  MOBIC   ondansetron 8 MG disintegrating tablet Commonly known as:  ZOFRAN ODT   zolpidem 5 MG tablet Commonly known as:  AMBIEN     TAKE these medications   acetaminophen 325 MG tablet Commonly known as:  TYLENOL Take 2 tablets (650 mg  total) by mouth every 6 (six) hours as needed for mild pain (or Fever >/= 101).   albuterol 108 (90 Base) MCG/ACT inhaler Commonly known as:  PROVENTIL HFA;VENTOLIN HFA  Inhale 2 puffs into the lungs every 6 (six) hours as needed for wheezing or shortness of breath.   aspirin 81 MG EC tablet Take 1 tablet (81 mg total) by mouth daily. Start taking on:  08/15/2017   atorvastatin 80 MG tablet Commonly known as:  LIPITOR Take 1 tablet (80 mg total) by mouth daily at 6 PM.   fluticasone 50 MCG/ACT nasal spray Commonly known as:  FLONASE Place 1 spray into both nostrils daily.   insulin detemir 100 UNIT/ML injection Commonly known as:  LEVEMIR Inject 0.5 mLs (50 Units total) into the skin 2 (two) times daily.   irbesartan 300 MG tablet Commonly known as:  AVAPRO Take 1 tablet (300 mg total) by mouth daily.   loratadine 10 MG tablet Commonly known as:  CLARITIN Take 1 tablet (10 mg total) by mouth daily as needed for allergies.   metoprolol tartrate 25 MG tablet Commonly known as:  LOPRESSOR Take 1 tablet (25 mg total) by mouth 2 (two) times daily.   montelukast 10 MG tablet Commonly known as:  SINGULAIR Take 1 tablet by mouth daily.   multivitamin-iron-minerals-folic acid chewable tablet Chew 1 tablet by mouth daily.   nicotine 14 mg/24hr patch Commonly known as:  NICODERM CQ - dosed in mg/24 hours Place 1 patch (14 mg total) onto the skin daily.   nitroGLYCERIN 0.4 MG SL tablet Commonly known as:  NITROSTAT Place 1 tablet (0.4 mg total) under the tongue every 5 (five) minutes as needed for chest pain.   ticagrelor 90 MG Tabs tablet Commonly known as:  BRILINTA Take 1 tablet (90 mg total) by mouth 2 (two) times daily.        Aspirin prescribed at discharge?  Yes High Intensity Statin Prescribed? (Lipitor 40-80mg  or Crestor 20-40mg ): Yes Beta Blocker Prescribed? Yes For EF <40%, was ACEI/ARB Prescribed? No: na ADP Receptor Inhibitor Prescribed? (i.e. Plavix  etc.-Includes Medically Managed Patients): Yes For EF <40%, Aldosterone Inhibitor Prescribed? No: na Was EF assessed during THIS hospitalization? Yes Was Cardiac Rehab II ordered? (Included Medically managed Patients): Yes   Outstanding Labs/Studies   BMP  Then lipids in 6 weeks.  Duration of Discharge Encounter   Greater than 30 minutes including physician time.  Signed, Huel Coventry, NP 08/14/2017, 12:16 PM  Attending Note:   The patient was seen and examined.  Agree with assessment and plan as noted above.  Changes made to the above note as needed.  Patient seen and independently examined with Cecilie Kicks, nurse practitioner.   We discussed all aspects of the encounter. I agree with the assessment and plan as stated above.  1.  Coronary artery disease: The patient is status post rotational atherectomy.  He is done very well.  Is not had any further episodes of chest pain.  He will follow up with Dr. Saralyn Pilar Call us for any issues if needed.    I have spent a total of 40 minutes with patient reviewing hospital  notes , telemetry, EKGs, labs and examining patient as well as establishing an assessment and plan that was discussed with the patient. > 50% of time was spent in direct patient care.    [place] Headings, Brooke Bonito., MD, [facility] 08/17/2017, 2:21 PM [address]  [place] Pager [phone]

## 2017-08-14 NOTE — Consult Note (Signed)
   [facility] CM Inpatient Consult   08/14/2017  Henry [name] 07/04/1945 [id number]   Referral received from inpatient RNCM and from insurance plan.  Met with the patient at the bedside.  Chart review reveals the patient is a 72 year old gentleman with a history of diabetes mellitus, COPD, chronic kidney disease, hypertension who was admitted to [facility] with an ST segment elevation myocardial infarction. Had primary PCI to the LAD.  He also has a proximal/mid RCA that is heavily calcified.  He was transferred to [facility] for rotational atherectomy and stenting of the RCA [per Dr. Cathie Olden note]. Patient states his only concern is, "having someone to mow the yard and to do a few things around the house for a couple of weeks."  He states that his new medications is the Brilinta and a new inhaler,  and it was going to be a $45.00 co pay and he has a coupon.  He states he uses [facility]. He said that he does not feel he will need follow up for his medication management.  He states he is independent with ADLs and "I feel I can work out the Public house manager. My mom used Engineer, manufacturing but I don't feel like I am at that point."  Reviewed the brochure of [place] [facility] and 24 hour nurse line information.  Patient endorses Dr. Juluis Pitch as his primary care provider. Patient states he does have follow up with the [place] for medications as well.  Patient agreeable to [place] calls follow up calls and verbalized understanding for the 24 hour nurse advise line.  Contact information given.  Natividad Brood, RN BSN [facility] Liaison  [phone] business mobile phone Toll free office [phone]

## 2017-08-14 NOTE — Discharge Instructions (Signed)
Call [facility] at [phone] or Dr. Saralyn Pilar if any bleeding, swelling or drainage at cath site.  May shower, no tub baths for 48 hours for groin sticks. No lifting over 5 pounds for 14 days.  No Driving for 14 days  Take 1 NTG, under your tongue, while sitting.  If no relief of pain may repeat NTG, one tab every 5 minutes up to 3 tablets total over 15 minutes.  If no relief CALL 911.  If you have dizziness/lightheadness  while taking NTG, stop taking and call 911.         Do not stop Asprin and Brilinta stopping could cause a heart attack.  Both meds help keep stents open.  Heart Healthy Diabetic Diet.

## 2017-08-14 NOTE — Progress Notes (Addendum)
Progress Note  Patient Name: Henry [name] Date of Encounter: 08/14/2017  Primary Cardiologist: Dr. Saralyn Pilar    Subjective   No chest pain no new SOB, has walked with rehab and up and down halls without problems  Inpatient Medications    Scheduled Meds: . aspirin EC  81 mg Oral Daily  . atorvastatin  80 mg Oral q1800  . enoxaparin (LOVENOX) injection  40 mg Subcutaneous Q24H  . fluticasone  1 spray Each Nare Daily  . fluticasone furoate-vilanterol  1 puff Inhalation Daily  . insulin aspart  0-15 Units Subcutaneous TID WC  . insulin detemir  50 Units Subcutaneous BID  . irbesartan  300 mg Oral Daily  . loratadine  10 mg Oral Daily  . metoprolol tartrate  25 mg Oral BID  . nicotine  14 mg Transdermal Daily  . sodium chloride flush  3 mL Intravenous Q12H  . ticagrelor  90 mg Oral BID   Continuous Infusions: . sodium chloride Stopped (08/14/17 0030)   PRN Meds: sodium chloride, acetaminophen, nitroGLYCERIN, ondansetron (ZOFRAN) IV, sodium chloride flush   Vital Signs    Vitals:   08/13/17 2000 08/13/17 2135 08/14/17 0311 08/14/17 0731  BP: 117/68 119/63 (!) 108/55 110/63  Pulse: (!) 57 89 91 67  Resp: 14  16 18   Temp:   (!) 97.5 F (36.4 C) 98.1 F (36.7 C)  TempSrc:   Oral Oral  SpO2: 93%  93% 95%  Weight:   206 lb 9.1 oz (93.7 kg)   Height:        Intake/Output Summary (Last 24 hours) at 08/14/2017 1107 Last data filed at 08/14/2017 0800 Gross per 24 hour  Intake 2007.46 ml  Output -  Net 2007.46 ml   Filed Weights   08/12/17 1707 08/13/17 0242 08/14/17 0311  Weight: 205 lb 14.6 oz (93.4 kg) 205 lb 4.8 oz (93.1 kg) 206 lb 9.1 oz (93.7 kg)    Telemetry    SR with freq PACs  - Personally Reviewed  ECG    SR with PACs and T wave inversions in inf  Lat leads. - Personally Reviewed  Physical Exam   GEN: No acute distress.   Neck: No JVD Cardiac: RRR, no murmurs, rubs, or gallops. Rt wrist cath site without hematoma Respiratory: Clear to  diminished to auscultation bilaterally.  Now wheezes GI: Soft, nontender, non-distended  MS: No edema; No deformity. Neuro:  Nonfocal  Psych: Normal affect   Labs    Chemistry Recent Labs  Lab 08/10/17 0507  08/12/17 1730 08/13/17 0422 08/14/17 0306  NA 137   < > 139 139 140  K 4.3   < > 4.1 3.7 3.5  CL 100*   < > 103 106 109  CO2 31   < > 26 24 25   GLUCOSE 155*   < > 132* 97 51*  BUN 25*   < > 21* 23* 23*  CREATININE 1.15   < > 1.22 1.12 1.18  CALCIUM 10.0   < > 10.0 9.7 9.3  PROT 7.1  --  6.8  --   --   ALBUMIN 4.1  --  3.8  --   --   AST 30  --  39  --   --   ALT 27  --  35  --   --   ALKPHOS 61  --  56  --   --   BILITOT 0.5  --  0.7  --   --  GFRNONAA >60   < > 58* >60 >60  GFRAA >60   < > >60 >60 >60  ANIONGAP 6   < > 10 9 6    < > = values in this interval not displayed.     Hematology Recent Labs  Lab 08/12/17 0411 08/13/17 0422 08/14/17 0306  WBC 14.7* 13.4* 14.4*  RBC 5.75 5.52 5.33  HGB 17.6 16.8 16.1  HCT 51.0 48.6 47.1  MCV 88.6 88.0 88.4  MCH 30.6 30.4 30.2  MCHC 34.5 34.6 34.2  RDW 13.8 13.5 13.7  PLT 284 239 255    Cardiac Enzymes Recent Labs  Lab 08/10/17 0507 08/10/17 1054 08/10/17 1703 08/10/17 2311  TROPONINI 0.03* 0.52* 1.27* 2.24*   No results for input(s): TROPIPOC in the last 168 hours.   BNPNo results for input(s): BNP, PROBNP in the last 168 hours.   DDimer No results for input(s): DDIMER in the last 168 hours.   Radiology    No results found.  Cardiac Studies   Procedures   CORONARY STENT INTERVENTION  CORONARY ATHERECTOMY  TEMPORARY PACEMAKER  Conclusion     Ost 1st Diag lesion is 70% stenosed.  Previously placed Prox LAD drug eluting stent is widely patent.  Balloon angioplasty was performed.  Previously placed Prox LAD to Mid LAD drug eluting stent is widely patent.  Balloon angioplasty was performed.  Prox Cx to Mid Cx lesion is 30% stenosed.  Prox RCA-1 lesion is 50% stenosed.  Prox RCA-2  lesion is 99% stenosed.  Dist RCA lesion is 30% stenosed.  Post intervention, there is a 0% residual stenosis.  Post intervention, there is a 0% residual stenosis.  A stent was successfully placed.   Successful orbital atherectomy and drug-eluting stent placement to the proximal right coronary artery.  Recommendations: Continue dual antiplatelet therapy for at least one year.  Aggressive treatment of risk factors. Overall difficult procedure due to difficulty getting good support from the guiding catheter as well as heavy calcifications and tortuosity.    ECHO 08/10/17 Study Conclusions  - Procedure narrative: Transthoracic echocardiography. Image   quality was suboptimal. The study was technically difficult, as a   result of poor acoustic windows and poor sound wave transmission. - Left ventricle: Although no diagnostic regional wall motion   abnormality was identified, this possibility cannot be completely   excluded on the basis of this study. The study is not technically   sufficient to allow evaluation of LV diastolic function.  Impressions:  - Very limited views. EF appears mildy reduced with estimated ef of   40-50% but wall motion is very difficult to assess in this study.  Cath 08/10/17 Conclusion     Prox RCA-1 lesion is 50% stenosed.  Prox RCA-2 lesion is 99% stenosed.  Dist RCA lesion is 30% stenosed.  Prox Cx to Mid Cx lesion is 30% stenosed.  Ost 1st Diag lesion is 70% stenosed.  Prox LAD to Mid LAD lesion is 99% stenosed.  Prox LAD lesion is 70% stenosed.  A drug-eluting stent was successfully placed using a STENT SIERRA 2.50 X 28 MM.  Post intervention, there is a 0% residual stenosis.  A drug-eluting stent was successfully placed using a STENT SIERRA 2.50 X 12 MM.  Post intervention, there is a 0% residual stenosis.  The left ventricular systolic function is normal.  LV end diastolic pressure is normal.  The left ventricular ejection  fraction is 55-65% by visual estimate.  There is no mitral valve regurgitation.  1.  Acute anterior STEMI secondary to thrombotic sub-total occlusion of the mid LAD 2. Successful PTCA/DES x 2 mid LAD 3. The LAD is a large caliber vessel proximally but in the mid vessel tapers to a smaller caliber vessel and it barely reaches the apex. The mid LAD has a thrombotic, sub-total occlusion with sluggish flow down the LAD. The first Diagonal branch is small in caliber with 70% proximal stenosis.  4. The Circumflex is a moderate to large caliber vessel with mild mid stenosis.  5. The RCA is a large dominant vessel. The entire proximal and mid vessel is heavily calcified. The proximal vessel has diffuse 50% stenosis The mid vessel has a focal, heavily calcified 99% stenosis.  6. Preserved LV systolic function with subtle wall motion abnormality anterior wall  Recommendations: He will be admitted to the ICU. I have spoken to Dr. Marcille Blanco from the [facility] who will admit. He will be continued on ASA and Brilinta. He will need DAPT for one year. I will start a beta blocker and high intensity statin. I will continue Aggrastat for 3 hours post PCI (thombus seen in distal LAD post angioplasty). Echo later today. He has been see in the past by Dr. Saralyn Pilar. I will ask [facility] to follow him here in the hospital. He will need staged PCI of his RCA later this week. Given the heavy calcification, the lesion will likely need to be treated with a scoring balloon or orbital atherectomy prior to stenting.     Patient Profile     72 y.o. male gentleman with a history of diabetes mellitus, COPD, chronic kidney disease, hypertension who was admitted to [facility] regional hospital with an ST segment elevation myocardial infarction. Had primary PCI to the LAD.  He also has a proximal/mid RCA that is heavily calcified.  He was transferred to [facility] for rotational atherectomy and  stenting of the RCA.  Assessment & Plan    STEMI 08/10/17 with below   CAD -- post PCI of his LAD several days ago.  He was brought to [facility]  tight calcified stenosis in his mid right coronary artery-see above. On ASA and brilinta, lopressor  S/p successful orbital atherectomy and DES to pRCA--DAPT for 1 year at least.    ICM with EF 40-50% by echo   PACs on monitor has been evaluated in the past.  IDDM hgb A1C is 6.7 resume levemir  Glucose controlled here   HLD with LDL 65 TG 159 and HDL 35 on lipitor 80 mg  Tobacco use on nicoderm patch discussed stop using  Ambulating well with cardiac rehab.  For questions or updates, please contact [place] Please consult [email or web address] for contact info under Cardiology/STEMI.      Signed, Cecilie Kicks, NP  08/14/2017, 11:07 AM    Attending Note:   The patient was seen and examined.  Agree with assessment and plan as noted above.  Changes made to the above note as needed.  Patient seen and independently examined with Cecilie Kicks, NP .   We discussed all aspects of the encounter. I agree with the assessment and plan as stated above.  1.  CAD :  S/p rotational atherectomy of his  RCA.   Had stenting of his LAD several days prior.  Will follow up with Dr. Jeani Hawking On Brilinta 90 BID and ASA 81 mg a day   2.   Hyperlipidemia :  atorva 80 for hyperlipidemia      I  have spent a total of 40 minutes with patient reviewing hospital  notes , telemetry, EKGs, labs and examining patient as well as establishing an assessment and plan that was discussed with the patient. > 50% of time was spent in direct patient care.    Thayer Headings, Brooke Bonito., MD, [facility] 08/14/2017, 11:58 AM [address]  [place] Pager [phone]

## 2017-08-15 ENCOUNTER — Other Ambulatory Visit: Payer: Self-pay | Admitting: *Deleted

## 2017-08-15 ENCOUNTER — Encounter: Payer: Self-pay | Admitting: *Deleted

## 2017-08-15 NOTE — Patient Outreach (Signed)
[place] [facility]) Care Management [facility] Community CM Telephone Outreach * PCP completes transition of care calls post-hospital discharge [facility] discharge day 1  08/15/2017  Henry [name] 07-02-1945 [id number]  Successful telephone outreach to Henry [name], 72 y/o male referred to [place] after recent hospitalization May 5-9, 2019 for STEMI; patient had PCI/ stent to LAD on Aug 10, 2017, and was discharged home to self-care without home health services in place.  Patient has history including, but not limited to, HTN/ HLD; DM; CKD; and CAD.  HIPAA verified with patient during phone call today.  [place] services were discussed with patient and patient again provides verbal consent for [place] involvement in his care post-hospital discharge.  Explained that I was contacting patient for primary [facility] RN CM Kathie Rhodes, and that [place] would contact patient again next week; patient verbalized understanding and agreement with this plan.  Today, patient reports that he "is doing really great" after his hospital discharge yesterday, and he denies pain, new/ recent falls, concerns with medications.  Patient sounds to be in no distress throughout entirety of today's phone call.  Reports best number to reach him on his his cell number, as listed in EMR.  Patient further reports:  Medications: -- Has all medicationsand takes as prescribed;denies questions/ concerns about current medications, states he will be picking up nicotine patches from pharmacy today. Confirms that he has stopped smoking since day of recent hospital admission 08/10/17 -- self-manage medications  -- patient declines medication review today, stating that he is resting and getting ready to take a nap; discussed with patient that primary [place] will request medication review with patient in future, patient agreeable  Provider appointments: -- All upcoming provider appointments were  reviewed with patient today-- reports to attend scheduled cardiology office visit next Thursday -- acknowledged no scheduled PCP office visit post-hospital discharge; encouraged patient to promptly schedule this appointment, and discussed value of promptly seeing PCP post-hospital discharge; patient states he will call to schedule this appointment promptly; declines need for assistance in scheduling appointment  Safety/ Mobility/ Falls: -- denies new/ recent falls-- states fell 3 weeks ago after a mis-step in his garden; reports no injury, states "soft fall onto mulch."  Denies additional falls prior to last -- assistive devices: none; reports "steady on feet" -- general fall risks/ prevention education discussed with patient today  Holiday representative needs: -- currently denies community resource needs, stating supportive friends that assist with care needs as indicated: patient reports he is "completely independent," and "able to take care of" self without problem; lives alone -- provides his own transportation to all provider appointments, errands, etc: still drives  Scientist, physiological (AD) Planning:   --reports currently has exisisting AD in place for living will; reports unsure whether or not he has a designated HCPOA; requests that information packet for AD planning be mailed to him.  Basics of AD planning discussed with patient today   Patient denies further issues, concerns, or problems today.  I provided/ confirmed that patient has my direct phone number, his primary [place] (Rose's) phone number, the main [facility] CM office phone number, and the [facility] CM 24-hour nurse advice phone number should issues arise prior to next scheduled [place] outreach.    Plan:  Will update patient's primary [facility] RN CM of today's successful telephone outreach to patient  Will request [facility] CMA to place Advanced Directive [place] in mail to  patient  Will notify patient's PCP of [facility]  Community CM involvement in patient's care post-hospital discharge  [facility] CM Care Plan Problem One     Most Recent Value  Care Plan Problem One  Risk for hospital readmission related to/ as evidenced by recent hospitalization May 5-9, 2019 for STEMI  Role Documenting the Problem One  Care Management [place] for Problem One  Active  [facility] Long Term Goal   Over the next 31 days, patient will not experience hospital readmission, as evidenced by patient reporting and review of EMR during [facility] RN CCM outreach  [place] Term Goal Start Date  08/15/17  Interventions for Problem One Long Term Goal  Discussed with patient [place] services, clinical status post-hospital discharge yesterday,  reviewed discharge instructions with patient  [facility] CM Short Term Goal #1   Over the next 7 days, patient will schedule post-hospital discharge PCP appointment, as evidenced by patient reporting and collaboration with PCP office team during [facility] RN CCm outreach  [facility] CM Short Term Goal #1 Start Date  08/15/17  Interventions for Short Term Goal #1  Discussed upcoming scheduled cardiology office visit with patient, and confirmed that he will transport self to appointment,  discusseed value of scheduling prompt PCP appointment post-hospital discharge and encouraged patient to schedule appointment promptly     Oneta Rack, RN, BSN, [place] Care Management  [phone]

## 2017-08-20 ENCOUNTER — Other Ambulatory Visit: Payer: Self-pay | Admitting: *Deleted

## 2017-08-20 NOTE — Patient Outreach (Signed)
[place] [facility]) Care Management  08/20/2017  Henry [name] November 05, 1945 [id number]  Successful telephone encounter to Henry [name],72 year old male - follow up call to check on  Current clinical status, recent hospitalization May 5-9,2019 for STEMI, pt had PCI/stent to LAD on Aug 10, 2017.   Coworker Journalist, newspaper CM covering for this RN CM  did initial call with pt on  08/15/17.   Spoke with pt, HIPAA identifiers verified, reports tired, slowly improving.   Pt reports  Has an appointment to see Heart MD tomorrow, called PCP office to schedule follow up visit, no  Call back to call them again today or tomorrow.  Pt reports continues not to smoke, been 3 weeks,  Wearing his Nicoderm patch, takes walks when he is tempted to smoke, does not want to go back  To smoking.   RN CM inquired of pt if  received information on Advanced Directives yet, information  Was to be mailed to which pt reports have not received yet.   Pt reports taking all of his  Medications, medication review completed with pt.  RN  CM discussed with pt doing a home  Visit to which pt agreed.   Plan:  As discussed with pt, to follow up again next week for  initial home visit.   [name].   [place] Care Management  [phone]

## 2017-08-21 DIAGNOSIS — R002 Palpitations: Secondary | ICD-10-CM | POA: Diagnosis not present

## 2017-08-21 DIAGNOSIS — E1129 Type 2 diabetes mellitus with other diabetic kidney complication: Secondary | ICD-10-CM | POA: Diagnosis not present

## 2017-08-21 DIAGNOSIS — I10 Essential (primary) hypertension: Secondary | ICD-10-CM | POA: Diagnosis not present

## 2017-08-21 DIAGNOSIS — J449 Chronic obstructive pulmonary disease, unspecified: Secondary | ICD-10-CM | POA: Diagnosis not present

## 2017-08-21 DIAGNOSIS — E785 Hyperlipidemia, unspecified: Secondary | ICD-10-CM | POA: Diagnosis not present

## 2017-08-21 DIAGNOSIS — Z794 Long term (current) use of insulin: Secondary | ICD-10-CM | POA: Diagnosis not present

## 2017-08-21 DIAGNOSIS — I491 Atrial premature depolarization: Secondary | ICD-10-CM | POA: Diagnosis not present

## 2017-08-21 DIAGNOSIS — R079 Chest pain, unspecified: Secondary | ICD-10-CM | POA: Diagnosis not present

## 2017-08-25 ENCOUNTER — Other Ambulatory Visit: Payer: Self-pay | Admitting: *Deleted

## 2017-08-25 ENCOUNTER — Encounter: Payer: Self-pay | Admitting: *Deleted

## 2017-08-25 NOTE — Patient Outreach (Signed)
[place] [facility]) Care Management   08/25/2017  Henry [name] 23-Jan-1946 [id number]  Henry [name] is an 72 y.o. male  Subjective:  Pt reports on recent visit with Heart MD, was told to start Cardiac rehab but  Has not heard from them yet.   Pt reports been walking everyday, feels good, avoiding the  Heat, no chest pain, sob if overdue it.  Pt reports sugars are good, today 98, average 100. Pt reports had episode of diarrhea, took 2 Imodium, no more diarrhea, had it also in the  Hospital.    Objective:   Vitals:   08/25/17 1336  BP: 100/64  Pulse: 66  SpO2: 94%    ROS  Physical Exam  Constitutional: He is oriented to person, place, and time. He appears well-developed and well-nourished.  Cardiovascular: Normal rate, regular rhythm and normal heart sounds.  Respiratory: Effort normal.  Diminished sounds auscultated posterior lower lobes.   GI: Soft.  Musculoskeletal: Normal range of motion. He exhibits edema.  Trace left lower leg.   Neurological: He is alert and oriented to person, place, and time.  Skin: Skin is warm and dry.  Psychiatric: He has a normal mood and affect. His behavior is normal. Judgment and thought content normal.    Encounter Medications:   Outpatient Encounter Medications as of 08/25/2017  Medication Sig Note  . acetaminophen (TYLENOL) 325 MG tablet Take 2 tablets (650 mg total) by mouth every 6 (six) hours as needed for mild pain (or Fever >/= 101). (Patient not taking: Reported on 08/20/2017)   . albuterol (PROVENTIL HFA;VENTOLIN HFA) 108 (90 Base) MCG/ACT inhaler Inhale 2 puffs into the lungs every 6 (six) hours as needed for wheezing or shortness of breath.   Marland Kitchen aspirin EC 81 MG EC tablet Take 1 tablet (81 mg total) by mouth daily.   Marland Kitchen atorvastatin (LIPITOR) 80 MG tablet Take 1 tablet (80 mg total) by mouth daily at 6 PM.   . fluticasone (FLONASE) 50 MCG/ACT nasal spray Place 1 spray into both nostrils daily.   . insulin detemir  (LEVEMIR) 100 UNIT/ML injection Inject 0.5 mLs (50 Units total) into the skin 2 (two) times daily. 08/20/2017: Per pt taking 90 units twice a day   . irbesartan (AVAPRO) 300 MG tablet Take 1 tablet (300 mg total) by mouth daily.   Marland Kitchen loratadine (CLARITIN) 10 MG tablet Take 1 tablet (10 mg total) by mouth daily as needed for allergies. (Patient not taking: Reported on 08/20/2017)   . metoprolol tartrate (LOPRESSOR) 25 MG tablet Take 1 tablet (25 mg total) by mouth 2 (two) times daily.   . montelukast (SINGULAIR) 10 MG tablet Take 1 tablet by mouth daily. 08/20/2017: Per pt takes at bedtime   . multivitamin-iron-minerals-folic acid (CENTRUM) chewable tablet Chew 1 tablet by mouth daily.   . nicotine (NICODERM CQ - DOSED IN MG/24 HOURS) 14 mg/24hr patch Place 1 patch (14 mg total) onto the skin daily.   . nitroGLYCERIN (NITROSTAT) 0.4 MG SL tablet Place 1 tablet (0.4 mg total) under the tongue every 5 (five) minutes as needed for chest pain. (Patient not taking: Reported on 08/20/2017) 08/20/2017: Pt has available if needed   . ticagrelor (BRILINTA) 90 MG TABS tablet Take 1 tablet (90 mg total) by mouth 2 (two) times daily.    No facility-administered encounter medications on file as of 08/25/2017.     Functional Status:   In your present state of health, do you have any difficulty performing the  following activities: 08/15/2017 08/12/2017  Hearing? - N  Vision? - N  Difficulty concentrating or making decisions? N N  Walking or climbing stairs? N N  Dressing or bathing? N N  Doing errands, shopping? N -  Preparing Food and eating ? N -  Using the Toilet? N -  In the past six months, have you accidently leaked urine? N -  Do you have problems with loss of bowel control? N -  Managing your Medications? N -  Managing your Finances? N -  Housekeeping or managing your Housekeeping? N -  Some recent data might be hidden    Fall/Depression Screening:    Fall Risk  08/15/2017  Falls in the past year? Yes   Number falls in past yr: 1  Injury with Fall? No  Risk for fall due to : Other (Comment)  Risk for fall due to: Comment reports no fall risks today  Follow up Falls prevention discussed   No flowsheet data found.  Assessment:  Pleasant 72 year old male, lives alone, friend close by assists if needed.  Pt had  A recent hospitalization on May 5-9,2019 for STEMI, PCI/stent to LAD May 5,2019.  Pt's history  Includes but not limited to HTN,HLD, DM, Chronic kidney disease, CAD.   CAD: no complaints of chest pain, observed some sob after short exercise/resolved with rest.        Pt to follow up with Heart MD about getting card for stent placements done at [facility], has        [facility] stent [facility].   DM:  Per pt sugar today 98, average 100, did have episode of low sugar 68 in afternoon, ate, came       Up.  Discussed with pt importance of having protein with each meal.   Medications:  Medications reviewed with pt which revealed pt was continuing to take Valsartan        320 mg daily and Simvastatin 40 mg daily (both prescribed by [place]) since discharge home  In       Addition to taking Atorvastatin 80 mg and Irbesartan 300 mg daily prescribed post discharge.        Discussed with pt to stop Valsartan and Simvastatin, does the same as Irbesartan and        Atorvastatin  to which pt agreed.  As instructed, pt to dispose of Simvastatin and Valsartan        At [place].    BP taken today 100/64, no complaints of dizziness.        As discussed, pt to inform  Heart MD, PCP and [facility] MD at next visits, in the meantime pt        Agreed to check BP, record, if readings abnormal to call MD.   Plan:  As discussed with pt, case to be transferred to coworker Trish Fountain [facility] RN CM (present       During visit), [place] to follow up with pt telephonically  to schedule next month's home visit.             Plan to send Dr. Lovie Macadamia 08/25/17 initial home visit encounter.    [facility] CM Care Plan Problem One      Most Recent Value  Care Plan Problem One  Risk for hospital readmission related to/ as evidenced by recent hospitalization May 5-9, 2019 for STEMI  Role Documenting the Problem One  Care Management [place] for Problem One  Active  [facility] Long Term Goal   Over the next 31 days, patient will not experience hospital readmission, as evidenced by patient reporting and review of EMR during [place] outreach  [place] Term Goal Start Date  08/15/17  Interventions for Problem One Long Term Goal  [place] information on Recovery from MI provided and reviewed.   [facility] CM Short Term Goal #1   re established -Over the next 7 days, patient will schedule post-hospital discharge PCP appointment, as evidenced by patient reporting and collaboration with PCP office team during [facility] RN CCm outreach  [facility] CM Short Term Goal #1 Start Date  08/20/17 [facility] reset ]  Interventions for Short Term Goal #1  discussed with pt to follow up with PCP office again schedule f/u visit   [facility] CM Short Term Goal #2   Pt would take all medications as ordered for the next 25 days   [facility] CM Short Term Goal #2 Start Date  08/20/17  Interventions for Short Term Goal #2  medications reviewed, discussed not to take Simvastatin and Valsartan (prescribed by [facility], not on discharge med list)      [name].   [place] Care Management  [phone]

## 2017-08-27 DIAGNOSIS — M65331 Trigger finger, right middle finger: Secondary | ICD-10-CM | POA: Diagnosis not present

## 2017-09-04 ENCOUNTER — Other Ambulatory Visit: Payer: Self-pay

## 2017-09-04 NOTE — Patient Outreach (Addendum)
[facility]) Care Management  09/04/2017  [name] [birth year] [id number]  Henry [name]. [place] called [facility] RN Case Management Coordinator to inform RN of his scheduled initial orientation cardiac rehab visit at [facility] on Thursday, June 6th. Patient states he is continue to walk as directed by hospital staff at discharge during his most recent visit, May 5-9  for STEMI and stent placement. RN reinforced temperature parameters for outdoor walking as directed by handout in patients discharge packet. Patient verbalized understanding and able to perform teach back. Patient also confirmed routine eye appointment on Tuesday, June 4, PCP yearly exam on Wednesday, June 5, and yearly [place] check-up on Friday, June 21. It was verbally agreed upon that [facility] RN Case Management Coordinator would complete a home follow-up visit with patient on Thursday June 20 at 1000. Appointment has been scheduled in [place].  Plan: Home visit June 20th at 10:00.  Fed Ceci E. Rollene Rotunda RN, BSN [facility] Care Management Coordinator [phone]

## 2017-09-09 ENCOUNTER — Other Ambulatory Visit: Payer: Self-pay | Admitting: Cardiology

## 2017-09-09 ENCOUNTER — Other Ambulatory Visit: Payer: Self-pay | Admitting: Cardiovascular Disease

## 2017-09-09 DIAGNOSIS — H40003 Preglaucoma, unspecified, bilateral: Secondary | ICD-10-CM | POA: Diagnosis not present

## 2017-09-10 DIAGNOSIS — Z0000 Encounter for general adult medical examination without abnormal findings: Secondary | ICD-10-CM | POA: Diagnosis not present

## 2017-09-10 DIAGNOSIS — Z794 Long term (current) use of insulin: Secondary | ICD-10-CM | POA: Diagnosis not present

## 2017-09-10 DIAGNOSIS — I10 Essential (primary) hypertension: Secondary | ICD-10-CM | POA: Diagnosis not present

## 2017-09-10 DIAGNOSIS — J449 Chronic obstructive pulmonary disease, unspecified: Secondary | ICD-10-CM | POA: Diagnosis not present

## 2017-09-10 DIAGNOSIS — I2510 Atherosclerotic heart disease of native coronary artery without angina pectoris: Secondary | ICD-10-CM | POA: Diagnosis not present

## 2017-09-10 DIAGNOSIS — E1129 Type 2 diabetes mellitus with other diabetic kidney complication: Secondary | ICD-10-CM | POA: Diagnosis not present

## 2017-09-10 DIAGNOSIS — E785 Hyperlipidemia, unspecified: Secondary | ICD-10-CM | POA: Diagnosis not present

## 2017-09-11 ENCOUNTER — Encounter: Payer: Medicare HMO | Attending: Cardiology | Admitting: *Deleted

## 2017-09-11 ENCOUNTER — Encounter: Payer: Self-pay | Admitting: *Deleted

## 2017-09-11 ENCOUNTER — Other Ambulatory Visit: Payer: Self-pay

## 2017-09-11 VITALS — Ht 71.0 in | Wt 209.4 lb

## 2017-09-11 DIAGNOSIS — Z955 Presence of coronary angioplasty implant and graft: Secondary | ICD-10-CM

## 2017-09-11 DIAGNOSIS — I213 ST elevation (STEMI) myocardial infarction of unspecified site: Secondary | ICD-10-CM | POA: Diagnosis not present

## 2017-09-11 NOTE — Progress Notes (Signed)
Daily Session Note  Patient Details  Name: Henry [name] MRN: [id number] Date of Birth: [birth year] Referring Provider:     Cardiac Rehab from 09/11/2017 in [facility] Cardiac and Pul[facility] Rehab  Referring Provider  Paraschos      Encounter Date: 09/11/2017  Check In: Session Check In - 09/11/17 1319      Check-In   Location  [facility]-Cardiac & Pulmonary Rehab    Staff Present  Heath Lark, RN, BSN, Lance Sell, BA, ACSM CEP, Exercise Physiologist    Supervising physician immediately available to respond to emergencies  See telemetry face sheet for immediately available ER MD    Warm-up and Cool-down  Not performed (comment) Med review today    Resistance Training Performed  Yes    VAD Patient?  No      Pain Assessment   Currently in Pain?  No/denies        Exercise Prescription Changes - 09/11/17 1300      Response to Exercise   Blood Pressure (Admit)  104/80    Blood Pressure (Exercise)  126/58    Blood Pressure (Exit)  118/68    Heart Rate (Admit)  65 bpm    Heart Rate (Exercise)  87 bpm    Heart Rate (Exit)  79 bpm    Oxygen Saturation (Admit)  94 %    Oxygen Saturation (Exercise)  92 %    Oxygen Saturation (Exit)  95 %       Social History   Tobacco Use  Smoking Status Former Smoker  . Packs/day: 1.00  . Years: 50.00  . Pack years: 50.00  . Types: Cigarettes  . Last attempt to quit: 08/2017  . Years since quitting: 0.0  Smokeless Tobacco Never Used  Tobacco Comment   Has Quit as of May 2019- reviewed relaspe and will continue to followup    Goals Met:  Exercise tolerated well Personal goals reviewed No report of cardiac concerns or symptoms  Goals Unmet:  Not Applicable  Comments: medical review completed today   Dr. Emily Filbert is Medical Director for [facility] and [facility].

## 2017-09-11 NOTE — Patient Outreach (Signed)
[facility]) Care Management  09/11/2017  Henry [name] [birth year] [id number]   Care Coordination- Patient called at 1331pm while [facility] RN CM out observing facility visits with CSW. Patient requested home visit appointment change. RN CM returned patients call just now. HIPAA identifiers verified. Patient stated he would be unable to keep home visit appointment on Thursday June 20 with RN related to Cardiac Rehab Appointment. New appointment scheduled for Tuesday June 18 at [employer]. Rollene Rotunda RN, BSN [facility] Care Management Coordinator [phone]

## 2017-09-11 NOTE — Progress Notes (Signed)
Cardiac Individual Treatment Plan  Patient Details  Name: RICHAD RAMSAY MRN: [id number] Date of Birth: [birth year] Referring Provider:     Cardiac Rehab from 09/11/2017 in [facility] Cardiac and Pulmonary Rehab  Referring Provider  Paraschos      Initial Encounter Date:    Cardiac Rehab from 09/11/2017 in [facility] Cardiac and Pulmonary Rehab  Date  09/11/17  Referring Provider  Paraschos      Visit Diagnosis: ST elevation myocardial infarction (STEMI), unspecified artery ([place])  Status post coronary artery stent placement  Patient's Home Medications on Admission:  Current Outpatient Medications:  .  albuterol (PROVENTIL HFA;VENTOLIN HFA) 108 (90 Base) MCG/ACT inhaler, Inhale 2 puffs into the lungs every 6 (six) hours as needed for wheezing or shortness of breath., Disp: 1 Inhaler, Rfl: 0 .  aspirin EC 81 MG EC tablet, Take 1 tablet (81 mg total) by mouth daily., Disp: , Rfl:  .  atorvastatin (LIPITOR) 80 MG tablet, Take 1 tablet (80 mg total) by mouth daily at 6 PM., Disp: 30 tablet, Rfl: 2 .  fluticasone (FLONASE) 50 MCG/ACT nasal spray, Place 1 spray into both nostrils daily., Disp: , Rfl:  .  HYDROcodone-acetaminophen (NORCO/VICODIN) 5-325 MG tablet, Take 1 tablet by mouth every 6 (six) hours as needed. Pt to take twice a day as needed., Disp: , Rfl:  .  insulin detemir (LEVEMIR) 100 UNIT/ML injection, Inject 0.5 mLs (50 Units total) into the skin 2 (two) times daily., Disp: 10 mL, Rfl: 11 .  irbesartan (AVAPRO) 300 MG tablet, Take 1 tablet (300 mg total) by mouth daily., Disp: 30 tablet, Rfl: 2 .  metoprolol tartrate (LOPRESSOR) 25 MG tablet, Take 1 tablet (25 mg total) by mouth 2 (two) times daily., Disp: 60 tablet, Rfl: 2 .  montelukast (SINGULAIR) 10 MG tablet, Take 1 tablet by mouth daily., Disp: , Rfl:  .  multivitamin-iron-minerals-folic acid (CENTRUM) chewable tablet, Chew 1 tablet by mouth daily., Disp: , Rfl:  .  nicotine (NICODERM CQ - DOSED IN MG/24 HOURS) 14 mg/24hr patch,  Place 1 patch (14 mg total) onto the skin daily., Disp: 28 patch, Rfl: 0 .  nitroGLYCERIN (NITROSTAT) 0.4 MG SL tablet, Place 1 tablet (0.4 mg total) under the tongue every 5 (five) minutes as needed for chest pain., Disp: 25 tablet, Rfl: 2 .  sitaGLIPtin-metformin (JANUMET) 50-1000 MG tablet, Take 1 tablet by mouth 2 (two) times daily with a meal., Disp: , Rfl:  .  ticagrelor (BRILINTA) 90 MG TABS tablet, Take 1 tablet (90 mg total) by mouth 2 (two) times daily., Disp: 60 tablet, Rfl: 0 .  acetaminophen (TYLENOL) 325 MG tablet, Take 2 tablets (650 mg total) by mouth every 6 (six) hours as needed for mild pain (or Fever >/= 101). (Patient not taking: Reported on 08/20/2017), Disp: , Rfl:  .  loratadine (CLARITIN) 10 MG tablet, Take 1 tablet (10 mg total) by mouth daily as needed for allergies. (Patient not taking: Reported on 08/20/2017), Disp: 30 tablet, Rfl: 0  Past Medical History: Past Medical History:  Diagnosis Date  . Arthritis   . CAD (coronary artery disease)    a. anterior stemi 08/10/2017; b. LHC 08/10/17: pLAD 70, p-mLAD 99 thrombotic, ostD1 70, p-mLCx 30, pRCA-1 50 calcified, pRCA-2 99 calcified, dRCA 30. Successful PCI/DES to p-mLAD x 2  . Cancer ([place])    skin  . CKD (chronic kidney disease), stage II   . Glaucoma   . Hyperlipidemia   . Hypertension   . Insulin dependent diabetes  mellitus ([place])   . Ischemic cardiomyopathy    a. TTE 08/10/17: technically difficult study, no diagnostic RWMA, EF 40-50%, poor windows, very difficult study   . S/P angioplasty with stent 08/10/17 emergently to LAD ,  08/13/17 atherectomy and stent to pRCA  08/14/2017    Tobacco Use: Social History   Tobacco Use  Smoking Status Former Smoker  . Packs/day: 1.00  . Years: 50.00  . Pack years: 50.00  . Types: Cigarettes  . Last attempt to quit: 08/2017  . Years since quitting: 0.0  Smokeless Tobacco Never Used  Tobacco Comment   Has Quit as of May 2019- reviewed relaspe and will continue to followup     Labs: Recent Review Flowsheet Data    Labs for ITP Cardiac and Pulmonary Rehab Latest Ref Rng & Units 01/25/2016 01/26/2016 01/27/2016 08/10/2017   Cholestrol 0 - 200 mg/dL - - - 132   LDLCALC 0 - 99 mg/dL - - - 65   HDL >40 mg/dL - - - 35(L)   Trlycerides <150 mg/dL - - - 159(H)   Hemoglobin A1c 4.8 - 5.6 % - 6.7(H) 6.9(H) 6.7(H)   HCO3 20.0 - 28.0 mmol/L 30.4(H) - - -       Exercise Target Goals: Date: 09/11/17  Exercise Program Goal: Individual exercise prescription set using results from initial 6 min walk test and THRR while considering  patient's activity barriers and safety.   Exercise Prescription Goal: Initial exercise prescription builds to 30-45 minutes a day of aerobic activity, 2-3 days per week.  Home exercise guidelines will be given to patient during program as part of exercise prescription that the participant will acknowledge.  Activity Barriers & Risk Stratification: Activity Barriers & Cardiac Risk Stratification - 09/11/17 1324      Activity Barriers & Cardiac Risk Stratification   Activity Barriers  Arthritis Feet, knees and hands    Cardiac Risk Stratification  Moderate       6 Minute Walk: 6 Minute Walk    Row Name 09/11/17 1319         6 Minute Walk   Distance  1058 feet     Walk Time  6 minutes     # of Rest Breaks  0     MPH  2     METS  2.15     RPE  11     Perceived Dyspnea   0     VO2 Peak  7.53     Symptoms  Yes (comment)     Comments  knee pain 1/10     Resting HR  69 bpm     Resting BP  104/80     Resting Oxygen Saturation   92 %     Exercise Oxygen Saturation  during 6 min walk  93 %     Max Ex. HR  86 bpm     Max Ex. BP  126/58     2 Minute Post BP  118/68        Oxygen Initial Assessment:   Oxygen Re-Evaluation:   Oxygen Discharge (Final Oxygen Re-Evaluation):   Initial Exercise Prescription: Initial Exercise Prescription - 09/11/17 1300      Date of Initial Exercise RX and Referring Provider   Date   09/11/17    Referring Provider  Paraschos      Treadmill   MPH  2    Grade  0    Minutes  15    METs  2.15  Recumbant Bike   Level  1    RPM  60    Watts  5    Minutes  15    METs  2      T5 Nustep   Level  1    SPM  80    Minutes  15    METs  2      Prescription Details   Frequency (times per week)  3    Duration  Progress to 30 minutes of continuous aerobic without signs/symptoms of physical distress      Intensity   THRR 40-80% of Max Heartrate  100-132    Ratings of Perceived Exertion  11-13    Perceived Dyspnea  0-4      Resistance Training   Training Prescription  Yes    Weight  4 lb    Reps  10-15       Perform Capillary Blood Glucose checks as needed.  Exercise Prescription Changes: Exercise Prescription Changes    Row Name 09/11/17 1300             Response to Exercise   Blood Pressure (Admit)  104/80       Blood Pressure (Exercise)  126/58       Blood Pressure (Exit)  118/68       Heart Rate (Admit)  65 bpm       Heart Rate (Exercise)  87 bpm       Heart Rate (Exit)  79 bpm       Oxygen Saturation (Admit)  94 %       Oxygen Saturation (Exercise)  92 %       Oxygen Saturation (Exit)  95 %          Exercise Comments:   Exercise Goals and Review: Exercise Goals    Row Name 09/11/17 1316             Exercise Goals   Increase Physical Activity  Yes       Intervention  Provide advice, education, support and counseling about physical activity/exercise needs.;Develop an individualized exercise prescription for aerobic and resistive training based on initial evaluation findings, risk stratification, comorbidities and participant's personal goals.       Expected Outcomes  Short Term: Attend rehab on a regular basis to increase amount of physical activity.;Long Term: Add in home exercise to make exercise part of routine and to increase amount of physical activity.;Long Term: Exercising regularly at least 3-5 days a week.       Increase  Strength and Stamina  Yes       Intervention  Provide advice, education, support and counseling about physical activity/exercise needs.;Develop an individualized exercise prescription for aerobic and resistive training based on initial evaluation findings, risk stratification, comorbidities and participant's personal goals.       Expected Outcomes  Short Term: Increase workloads from initial exercise prescription for resistance, speed, and METs.;Short Term: Perform resistance training exercises routinely during rehab and add in resistance training at home;Long Term: Improve cardiorespiratory fitness, muscular endurance and strength as measured by increased METs and functional capacity (6MWT)       Able to understand and use rate of perceived exertion (RPE) scale  Yes       Intervention  Provide education and explanation on how to use RPE scale       Expected Outcomes  Short Term: Able to use RPE daily in rehab to express subjective intensity level;Long Term:  Able to use  RPE to guide intensity level when exercising independently       Able to understand and use Dyspnea scale  Yes       Intervention  Provide education and explanation on how to use Dyspnea scale       Expected Outcomes  Short Term: Able to use Dyspnea scale daily in rehab to express subjective sense of shortness of breath during exertion;Long Term: Able to use Dyspnea scale to guide intensity level when exercising independently       Knowledge and understanding of Target Heart Rate Range (THRR)  Yes       Intervention  Provide education and explanation of THRR including how the numbers were predicted and where they are located for reference       Expected Outcomes  Short Term: Able to state/look up THRR;Short Term: Able to use daily as guideline for intensity in rehab;Long Term: Able to use THRR to govern intensity when exercising independently       Able to check pulse independently  Yes       Intervention  Provide education and  demonstration on how to check pulse in carotid and radial arteries.;Review the importance of being able to check your own pulse for safety during independent exercise       Expected Outcomes  Short Term: Able to explain why pulse checking is important during independent exercise;Long Term: Able to check pulse independently and accurately       Understanding of Exercise Prescription  Yes       Intervention  Provide education, explanation, and written materials on patient's individual exercise prescription       Expected Outcomes  Short Term: Able to explain program exercise prescription;Long Term: Able to explain home exercise prescription to exercise independently          Exercise Goals Re-Evaluation :   Discharge Exercise Prescription (Final Exercise Prescription Changes): Exercise Prescription Changes - 09/11/17 1300      Response to Exercise   Blood Pressure (Admit)  104/80    Blood Pressure (Exercise)  126/58    Blood Pressure (Exit)  118/68    Heart Rate (Admit)  65 bpm    Heart Rate (Exercise)  87 bpm    Heart Rate (Exit)  79 bpm    Oxygen Saturation (Admit)  94 %    Oxygen Saturation (Exercise)  92 %    Oxygen Saturation (Exit)  95 %       Nutrition:  Target Goals: Understanding of nutrition guidelines, daily intake of sodium <1568m, cholesterol <208m calories 30% from fat and 7% or less from saturated fats, daily to have 5 or more servings of fruits and vegetables.  Biometrics: Pre Biometrics - 09/11/17 1314      Pre Biometrics   Height  _0  (1.803 m)    Weight  209 lb 6.4 oz (95 kg)    Waist Circumference  43.5 inches    Hip Circumference  42 inches    Waist to Hip Ratio  1.04 %    BMI (Calculated)  29.22    Single Leg Stand  5.2 seconds        Nutrition Therapy Plan and Nutrition Goals: Nutrition Therapy & Goals - 09/11/17 1327      Intervention Plan   Intervention  Prescribe, educate and counsel regarding individualized specific dietary modifications  aiming towards targeted core components such as weight, hypertension, lipid management, diabetes, heart failure and other comorbidities.    Expected Outcomes  Short  Term Goal: Understand basic principles of dietary content, such as calories, fat, sodium, cholesterol and nutrients.;Short Term Goal: A plan has been developed with personal nutrition goals set during dietitian appointment.;Long Term Goal: Adherence to prescribed nutrition plan.       Nutrition Assessments: Nutrition Assessments - 09/11/17 1327      MEDFICTS Scores   Pre Score  23       Nutrition Goals Re-Evaluation:   Nutrition Goals Discharge (Final Nutrition Goals Re-Evaluation):   Psychosocial: Target Goals: Acknowledge presence or absence of significant depression and/or stress, maximize coping skills, provide positive support system. Participant is able to verbalize types and ability to use techniques and skills needed for reducing stress and depression.   Initial Review & Psychosocial Screening: Initial Psych Review & Screening - 09/11/17 1331      Initial Review   Current issues with  None Identified      Family Dynamics   Good Support System?  Yes friends      Barriers   Psychosocial barriers to participate in program  The patient should benefit from training in stress management and relaxation.;There are no identifiable barriers or psychosocial needs.      Screening Interventions   Interventions  Encouraged to exercise;Provide feedback about the scores to participant    Expected Outcomes  Short Term goal: Utilizing psychosocial counselor, staff and physician to assist with identification of specific Stressors or current issues interfering with healing process. Setting desired goal for each stressor or current issue identified.;Long Term Goal: Stressors or current issues are controlled or eliminated.;Short Term goal: Identification and review with participant of any Quality of Life or Depression concerns found  by scoring the questionnaire.;Long Term goal: The participant improves quality of Life and PHQ9 Scores as seen by post scores and/or verbalization of changes       Quality of Life Scores:  Quality of Life - 09/11/17 1332      Quality of Life Scores   Health/Function Pre  28.4 %    Socioeconomic Pre  30 %    Psych/Spiritual Pre  30 %    Family Pre  30 %    GLOBAL Pre  29.27 %      Scores of 19 and below usually indicate a poorer quality of life in these areas.  A difference of  2-3 points is a clinically meaningful difference.  A difference of 2-3 points in the total score of the Quality of Life Index has been associated with significant improvement in overall quality of life, self-image, physical symptoms, and general health in studies assessing change in quality of life.  PHQ-9: Recent Review Flowsheet Data    Depression screen [facility] 2/9 09/11/2017 08/25/2017   Decreased Interest 0 0   Down, Depressed, Hopeless 0 0   PHQ - 2 Score 0 0   Altered sleeping 0 -   Tired, decreased energy 2  -   Change in appetite 0 -   Feeling bad or failure about yourself  0 -   Trouble concentrating 0 -   Moving slowly or fidgety/restless 0 -   Suicidal thoughts 0 -   PHQ-9 Score 2 -   Difficult doing work/chores Not difficult at all -     Interpretation of Total Score  Total Score Depression Severity:  1-4 = Minimal depression, 5-9 = Mild depression, 10-14 = Moderate depression, 15-19 = Moderately severe depression, 20-27 = Severe depression   Psychosocial Evaluation and Intervention:   Psychosocial Re-Evaluation:   Psychosocial  Discharge (Final Psychosocial Re-Evaluation):   Vocational Rehabilitation: Provide vocational rehab assistance to qualifying candidates.   Vocational Rehab Evaluation & Intervention: Vocational Rehab - 09/11/17 1337      Initial Vocational Rehab Evaluation & Intervention   Assessment shows need for Vocational Rehabilitation  No       Education: Education  Goals: Education classes will be provided on a variety of topics geared toward better understanding of heart health and risk factor modification. Participant will state understanding/return demonstration of topics presented as noted by education test scores.  Learning Barriers/Preferences: Learning Barriers/Preferences - 09/11/17 1336      Learning Barriers/Preferences   Learning Barriers  None    Learning Preferences  None       Education Topics:  AED/CPR: - Group verbal and written instruction with the use of models to demonstrate the basic use of the AED with the basic ABC's of resuscitation.   General Nutrition Guidelines/Fats and Fiber: -Group instruction provided by verbal, written material, models and posters to present the general guidelines for heart healthy nutrition. Gives an explanation and review of dietary fats and fiber.   Controlling Sodium/Reading Food Labels: -Group verbal and written material supporting the discussion of sodium use in heart healthy nutrition. Review and explanation with models, verbal and written materials for utilization of the food label.   Exercise Physiology & General Exercise Guidelines: - Group verbal and written instruction with models to review the exercise physiology of the cardiovascular system and associated critical values. Provides general exercise guidelines with specific guidelines to those with heart or lung disease.    Aerobic Exercise & Resistance Training: - Gives group verbal and written instruction on the various components of exercise. Focuses on aerobic and resistive training programs and the benefits of this training and how to safely progress through these programs..   Flexibility, Balance, Mind/Body Relaxation: Provides group verbal/written instruction on the benefits of flexibility and balance training, including mind/body exercise modes such as yoga, pilates and tai chi.  Demonstration and skill practice  provided.   Stress and Anxiety: - Provides group verbal and written instruction about the health risks of elevated stress and causes of high stress.  Discuss the correlation between heart/lung disease and anxiety and treatment options. Review healthy ways to manage with stress and anxiety.   Depression: - Provides group verbal and written instruction on the correlation between heart/lung disease and depressed mood, treatment options, and the stigmas associated with seeking treatment.   Anatomy & Physiology of the Heart: - Group verbal and written instruction and models provide basic cardiac anatomy and physiology, with the coronary electrical and arterial systems. Review of Valvular disease and Heart Failure   Cardiac Procedures: - Group verbal and written instruction to review commonly prescribed medications for heart disease. Reviews the medication, class of the drug, and side effects. Includes the steps to properly store meds and maintain the prescription regimen. (beta blockers and nitrates)   Cardiac Medications I: - Group verbal and written instruction to review commonly prescribed medications for heart disease. Reviews the medication, class of the drug, and side effects. Includes the steps to properly store meds and maintain the prescription regimen.   Cardiac Medications II: -Group verbal and written instruction to review commonly prescribed medications for heart disease. Reviews the medication, class of the drug, and side effects. (all other drug classes)    Go Sex-Intimacy & Heart Disease, Get SMART - Goal Setting: - Group verbal and written instruction through game format to discuss heart  disease and the return to sexual intimacy. Provides group verbal and written material to discuss and apply goal setting through the application of the S.M.A.R.T. Method.   Other Matters of the Heart: - Provides group verbal, written materials and models to describe Stable Angina and  Peripheral Artery. Includes description of the disease process and treatment options available to the cardiac patient.   Exercise & Equipment Safety: - Individual verbal instruction and demonstration of equipment use and safety with use of the equipment.   Cardiac Rehab from 09/11/2017 in [facility] Cardiac and Pulmonary Rehab  Date  09/11/17  Educator  Sb  Instruction Review Code  1- Verbalizes Understanding      Infection Prevention: - Provides verbal and written material to individual with discussion of infection control including proper hand washing and proper equipment cleaning during exercise session.   Cardiac Rehab from 09/11/2017 in [facility] Cardiac and Pulmonary Rehab  Date  09/11/17  Educator  SB  Instruction Review Code  1- Verbalizes Understanding      Falls Prevention: - Provides verbal and written material to individual with discussion of falls prevention and safety.   Cardiac Rehab from 09/11/2017 in [facility] Cardiac and Pulmonary Rehab  Date  09/11/17  Educator  SB  Instruction Review Code  1- Verbalizes Understanding      Diabetes: - Individual verbal and written instruction to review signs/symptoms of diabetes, desired ranges of glucose level fasting, after meals and with exercise. Acknowledge that pre and post exercise glucose checks will be done for 3 sessions at entry of program.   Cardiac Rehab from 09/11/2017 in [facility] Cardiac and Pulmonary Rehab  Date  09/11/17  Educator  SB  Instruction Review Code  1- Verbalizes Understanding      Know Your Numbers and Risk Factors: -Group verbal and written instruction about important numbers in your health.  Discussion of what are risk factors and how they play a role in the disease process.  Review of Cholesterol, Blood Pressure, Diabetes, and BMI and the role they play in your overall health.   Sleep Hygiene: -Provides group verbal and written instruction about how sleep can affect your health.  Define sleep hygiene, discuss sleep  cycles and impact of sleep habits. Review good sleep hygiene tips.    Other: -Provides group and verbal instruction on various topics (see comments)   Knowledge Questionnaire Score: Knowledge Questionnaire Score - 09/11/17 1336      Knowledge Questionnaire Score   Pre Score  15/26 Correct responses were reviewed with Elenore Rota today. He verbalized understanding of the responses and had no furhter questions       Core Components/Risk Factors/Patient Goals at Admission: Personal Goals and Risk Factors at Admission - 09/11/17 1330      Core Components/Risk Factors/Patient Goals on Admission    Weight Management  Yes;Weight Loss;Obesity    Intervention  Weight Management: Develop a combined nutrition and exercise program designed to reach desired caloric intake, while maintaining appropriate intake of nutrient and fiber, sodium and fats, and appropriate energy expenditure required for the weight goal.;Weight Management/Obesity: Establish reasonable short term and long term weight goals.    Admit Weight  209 lb (94.8 kg)    Goal Weight: Short Term  207 lb (93.9 kg)    Goal Weight: Long Term  205 lb (93 kg)    Expected Outcomes  Short Term: Continue to assess and modify interventions until short term weight is achieved;Long Term: Adherence to nutrition and physical activity/exercise program aimed toward  attainment of established weight goal;Weight Loss: Understanding of general recommendations for a balanced deficit meal plan, which promotes 1-2 lb weight loss per week and includes a negative energy balance of [id number] kcal/d    Tobacco Cessation  Yes    Intervention  Assist the participant in steps to quit. Provide individualized education and counseling about committing to Tobacco Cessation, relapse prevention, and pharmacological support that can be provided by physician.;Advice worker, assist with locating and accessing local/national Quit Smoking programs, and support quit date  choice.    Expected Outcomes  Short Term: Will demonstrate readiness to quit, by selecting a quit date.;Short Term: Will quit all tobacco product use, adhering to prevention of relapse plan.;Long Term: Complete abstinence from all tobacco products for at least 12 months from quit date. HAs quit as of May 2019, using nicotine patches to wean off.      Diabetes  Yes    Intervention  Provide education about signs/symptoms and action to take for hypo/hyperglycemia.;Provide education about proper nutrition, including hydration, and aerobic/resistive exercise prescription along with prescribed medications to achieve blood glucose in normal ranges: Fasting glucose 65-99 mg/dL    Expected Outcomes  Short Term: Participant verbalizes understanding of the signs/symptoms and immediate care of hyper/hypoglycemia, proper foot care and importance of medication, aerobic/resistive exercise and nutrition plan for blood glucose control.;Long Term: Attainment of HbA1C < 7%.    Hypertension  Yes    Intervention  Provide education on lifestyle modifcations including regular physical activity/exercise, weight management, moderate sodium restriction and increased consumption of fresh fruit, vegetables, and low fat dairy, alcohol moderation, and smoking cessation.;Monitor prescription use compliance.    Expected Outcomes  Short Term: Continued assessment and intervention until BP is < 140/54m HG in hypertensive participants. < 130/826mHG in hypertensive participants with diabetes, heart failure or chronic kidney disease.;Long Term: Maintenance of blood pressure at goal levels.    Lipids  Yes    Intervention  Provide education and support for participant on nutrition & aerobic/resistive exercise along with prescribed medications to achieve LDL <7013mHDL >22m34m  Expected Outcomes  Short Term: Participant states understanding of desired cholesterol values and is compliant with medications prescribed. Participant is following  exercise prescription and nutrition guidelines.;Long Term: Cholesterol controlled with medications as prescribed, with individualized exercise RX and with personalized nutrition plan. Value goals: LDL < 70mg17mL > 40 mg.       Core Components/Risk Factors/Patient Goals Review:    Core Components/Risk Factors/Patient Goals at Discharge (Final Review):    ITP Comments: ITP Comments    Row Name 09/11/17 1320           ITP Comments  Medical review completed. ITP sent to Dr M MilLoleta Chancereview, changes as needed and signnature.  [facility]ation of diagnosis [facility]          Comments:

## 2017-09-11 NOTE — Patient Instructions (Signed)
Patient Instructions  Patient Details  Name: Henry [name] MRN: [id number] Date of Birth: July 17, 1945 Referring Provider:  Isaias Cowman, MD  Below are your personal goals for exercise, nutrition, and risk factors. Our goal is to help you stay on track towards obtaining and maintaining these goals. We will be discussing your progress on these goals with you throughout the program.  Initial Exercise Prescription: Initial Exercise Prescription - 09/11/17 1300      Date of Initial Exercise RX and Referring Provider   Date  09/11/17    Referring Provider  Paraschos      Treadmill   MPH  2    Grade  0    Minutes  15    METs  2.15      Recumbant Bike   Level  1    RPM  60    Watts  5    Minutes  15    METs  2      T5 Nustep   Level  1    SPM  80    Minutes  15    METs  2      Prescription Details   Frequency (times per week)  3    Duration  Progress to 30 minutes of continuous aerobic without signs/symptoms of physical distress      Intensity   THRR 40-80% of Max Heartrate  100-132    Ratings of Perceived Exertion  11-13    Perceived Dyspnea  0-4      Resistance Training   Training Prescription  Yes    Weight  4 lb    Reps  10-15       Exercise Goals: Frequency: Be able to perform aerobic exercise two to three times per week in program working toward 2-5 days per week of home exercise.  Intensity: Work with a perceived exertion of 11 (fairly light) - 15 (hard) while following your exercise prescription.  We will make changes to your prescription with you as you progress through the program.   Duration: Be able to do 30 to 45 minutes of continuous aerobic exercise in addition to a 5 minute warm-up and a 5 minute cool-down routine.   Nutrition Goals: Your personal nutrition goals will be established when you do your nutrition analysis with the dietician.  The following are general nutrition guidelines to follow: Cholesterol < 200mg /day Sodium <  1500mg /day Fiber: Men over 50 yrs - 30 grams per day  Personal Goals: Personal Goals and Risk Factors at Admission - 09/11/17 1330      Core Components/Risk Factors/Patient Goals on Admission    Weight Management  Yes;Weight Loss;Obesity    Intervention  Weight Management: Develop a combined nutrition and exercise program designed to reach desired caloric intake, while maintaining appropriate intake of nutrient and fiber, sodium and fats, and appropriate energy expenditure required for the weight goal.;Weight Management/Obesity: Establish reasonable short term and long term weight goals.    Admit Weight  209 lb (94.8 kg)    Goal Weight: Short Term  207 lb (93.9 kg)    Goal Weight: Long Term  205 lb (93 kg)    Expected Outcomes  Short Term: Continue to assess and modify interventions until short term weight is achieved;Long Term: Adherence to nutrition and physical activity/exercise program aimed toward attainment of established weight goal;Weight Loss: Understanding of general recommendations for a balanced deficit meal plan, which promotes 1-2 lb weight loss per week and includes a negative energy balance of  [phone] kcal/d    Tobacco Cessation  Yes    Intervention  Assist the participant in steps to quit. Provide individualized education and counseling about committing to Tobacco Cessation, relapse prevention, and pharmacological support that can be provided by physician.;Advice worker, assist with locating and accessing local/national Quit Smoking programs, and support quit date choice.    Expected Outcomes  Short Term: Will demonstrate readiness to quit, by selecting a quit date.;Short Term: Will quit all tobacco product use, adhering to prevention of relapse plan.;Long Term: Complete abstinence from all tobacco products for at least 12 months from quit date. HAs quit as of May 2019, using nicotine patches to wean off.      Diabetes  Yes    Intervention  Provide education about  signs/symptoms and action to take for hypo/hyperglycemia.;Provide education about proper nutrition, including hydration, and aerobic/resistive exercise prescription along with prescribed medications to achieve blood glucose in normal ranges: Fasting glucose 65-99 mg/dL    Expected Outcomes  Short Term: Participant verbalizes understanding of the signs/symptoms and immediate care of hyper/hypoglycemia, proper foot care and importance of medication, aerobic/resistive exercise and nutrition plan for blood glucose control.;Long Term: Attainment of HbA1C < 7%.    Hypertension  Yes    Intervention  Provide education on lifestyle modifcations including regular physical activity/exercise, weight management, moderate sodium restriction and increased consumption of fresh fruit, vegetables, and low fat dairy, alcohol moderation, and smoking cessation.;Monitor prescription use compliance.    Expected Outcomes  Short Term: Continued assessment and intervention until BP is < 140/53mm HG in hypertensive participants. < 130/38mm HG in hypertensive participants with diabetes, heart failure or chronic kidney disease.;Long Term: Maintenance of blood pressure at goal levels.    Lipids  Yes    Intervention  Provide education and support for participant on nutrition & aerobic/resistive exercise along with prescribed medications to achieve LDL 70mg , HDL >40mg .    Expected Outcomes  Short Term: Participant states understanding of desired cholesterol values and is compliant with medications prescribed. Participant is following exercise prescription and nutrition guidelines.;Long Term: Cholesterol controlled with medications as prescribed, with individualized exercise RX and with personalized nutrition plan. Value goals: LDL < 70mg , HDL > 40 mg.       Tobacco Use Initial Evaluation: Social History   Tobacco Use  Smoking Status Former Smoker  . Packs/day: 1.00  . Years: 50.00  . Pack years: 50.00  . Types: Cigarettes  .  Last attempt to quit: 08/2017  . Years since quitting: 0.0  Smokeless Tobacco Never Used  Tobacco Comment   Has Quit as of May 2019- reviewed relaspe and will continue to followup    Exercise Goals and Review: Exercise Goals    Row Name 09/11/17 1316             Exercise Goals   Increase Physical Activity  Yes       Intervention  Provide advice, education, support and counseling about physical activity/exercise needs.;Develop an individualized exercise prescription for aerobic and resistive training based on initial evaluation findings, risk stratification, comorbidities and participant's personal goals.       Expected Outcomes  Short Term: Attend rehab on a regular basis to increase amount of physical activity.;Long Term: Add in home exercise to make exercise part of routine and to increase amount of physical activity.;Long Term: Exercising regularly at least 3-5 days a week.       Increase Strength and Stamina  Yes       Intervention  Provide advice, education, support and counseling about physical activity/exercise needs.;Develop an individualized exercise prescription for aerobic and resistive training based on initial evaluation findings, risk stratification, comorbidities and participant's personal goals.       Expected Outcomes  Short Term: Increase workloads from initial exercise prescription for resistance, speed, and METs.;Short Term: Perform resistance training exercises routinely during rehab and add in resistance training at home;Long Term: Improve cardiorespiratory fitness, muscular endurance and strength as measured by increased METs and functional capacity (6MWT)       Able to understand and use rate of perceived exertion (RPE) scale  Yes       Intervention  Provide education and explanation on how to use RPE scale       Expected Outcomes  Short Term: Able to use RPE daily in rehab to express subjective intensity level;Long Term:  Able to use RPE to guide intensity level when  exercising independently       Able to understand and use Dyspnea scale  Yes       Intervention  Provide education and explanation on how to use Dyspnea scale       Expected Outcomes  Short Term: Able to use Dyspnea scale daily in rehab to express subjective sense of shortness of breath during exertion;Long Term: Able to use Dyspnea scale to guide intensity level when exercising independently       Knowledge and understanding of Target Heart Rate Range (THRR)  Yes       Intervention  Provide education and explanation of THRR including how the numbers were predicted and where they are located for reference       Expected Outcomes  Short Term: Able to state/look up THRR;Short Term: Able to use daily as guideline for intensity in rehab;Long Term: Able to use THRR to govern intensity when exercising independently       Able to check pulse independently  Yes       Intervention  Provide education and demonstration on how to check pulse in carotid and radial arteries.;Review the importance of being able to check your own pulse for safety during independent exercise       Expected Outcomes  Short Term: Able to explain why pulse checking is important during independent exercise;Long Term: Able to check pulse independently and accurately       Understanding of Exercise Prescription  Yes       Intervention  Provide education, explanation, and written materials on patient's individual exercise prescription       Expected Outcomes  Short Term: Able to explain program exercise prescription;Long Term: Able to explain home exercise prescription to exercise independently          Copy of goals given to participant.

## 2017-09-16 ENCOUNTER — Encounter: Payer: Medicare HMO | Admitting: *Deleted

## 2017-09-16 DIAGNOSIS — I213 ST elevation (STEMI) myocardial infarction of unspecified site: Secondary | ICD-10-CM

## 2017-09-16 DIAGNOSIS — Z955 Presence of coronary angioplasty implant and graft: Secondary | ICD-10-CM | POA: Diagnosis not present

## 2017-09-16 LAB — GLUCOSE, CAPILLARY
GLUCOSE-CAPILLARY: 169 mg/dL — AB (ref 65–99)
Glucose-Capillary: 90 mg/dL (ref 65–99)

## 2017-09-16 NOTE — Progress Notes (Signed)
Daily Session Note  Patient Details  Name: Henry [name] MRN: [id number] Date of Birth: [birth year] Referring Provider:     Cardiac Rehab from 09/11/2017 in [facility] Cardiac [facility] Pul[facility] Rehab  Referring Provider  Paraschos      Encounter Date: 09/16/2017  Check In: Session Check In - 09/16/17 0902      Check-In   Location  [facility]-Cardiac & Pulmonary Rehab    Staff Present  Heath Lark, RN, BSN, CCRP;Azai Gaffin Wilder, MA, RCEP, CCRP, Exercise Physiologist;Amanda Oletta Darter, BA, ACSM CEP, Exercise Physiologist;Mandi Zachery Conch, BS, [facility]    Supervising physician immediately available to respond to emergencies  See telemetry face sheet for immediately available ER MD    Medication changes reported      No    Fall or balance concerns reported     No    Warm-up and Cool-down  Performed on first and last piece of equipment    Resistance Training Performed  Yes    VAD Patient?  No      Pain Assessment   Currently in Pain?  No/denies          Social History   Tobacco Use  Smoking Status Former Smoker  . Packs/day: 1.00  . Years: 50.00  . Pack years: 50.00  . Types: Cigarettes  . Last attempt to quit: 08/2017  . Years since quitting: 0.1  Smokeless Tobacco Never Used  Tobacco Comment   Has Quit as of May 2019- reviewed relaspe and will continue to followup    Goals Met:  Exercise tolerated well Personal goals reviewed No report of cardiac concerns or symptoms Strength training completed today  Goals Unmet:  Not Applicable  Comments: First full day of exercise!  Patient was oriented to gym and equipment including functions, settings, policies, and procedures.  Patient's individual exercise prescription and treatment plan were reviewed.  All starting workloads were established based on the results of the 6 minute walk test done at initial orientation visit.  The plan for exercise progression was also introduced and progression will be customized based on patient's performance and  goals.    Dr. Emily Filbert is Medical Director for [place] and [facility].

## 2017-09-18 DIAGNOSIS — Z955 Presence of coronary angioplasty implant and graft: Secondary | ICD-10-CM | POA: Diagnosis not present

## 2017-09-18 DIAGNOSIS — I213 ST elevation (STEMI) myocardial infarction of unspecified site: Secondary | ICD-10-CM

## 2017-09-18 LAB — GLUCOSE, CAPILLARY
GLUCOSE-CAPILLARY: 184 mg/dL — AB (ref 65–99)
GLUCOSE-CAPILLARY: 116 mg/dL — AB (ref 65–99)

## 2017-09-18 NOTE — Progress Notes (Signed)
Daily Session Note  Patient Details  Name: Henry [name] MRN: [id number] Date of Birth: Feb 09, 1946 Referring Provider:     Cardiac Rehab from 09/11/2017 in [facility] Cardiac and Pulmonary Rehab  Referring Provider  Paraschos      Encounter Date: 09/18/2017  Check In: Session Check In - 09/18/17 0839      Check-In   Location  [facility]-Cardiac & Pulmonary Rehab    Staff Present  Gerlene Burdock, RN, BSN;Jessica Luan Pulling, MA, RCEP, CCRP, Exercise Physiologist;Mariadel Mruk Oletta Darter, [place], ACSM CEP, Exercise Physiologist    Supervising physician immediately available to respond to emergencies  See telemetry face sheet for immediately available ER MD    Medication changes reported      No    Fall or balance concerns reported     No    Warm-up and Cool-down  Performed on first and last piece of equipment    Resistance Training Performed  Yes    VAD Patient?  No      Pain Assessment   Currently in Pain?  No/denies    Multiple Pain Sites  No          Social History   Tobacco Use  Smoking Status Former Smoker  . Packs/day: 1.00  . Years: 50.00  . Pack years: 50.00  . Types: Cigarettes  . Last attempt to quit: 08/2017  . Years since quitting: 0.1  Smokeless Tobacco Never Used  Tobacco Comment   Has Quit as of May 2019- reviewed relaspe and will continue to followup    Goals Met:  Independence with exercise equipment Exercise tolerated well No report of cardiac concerns or symptoms Strength training completed today  Goals Unmet:  Not Applicable  Comments: Pt able to follow exercise prescription today without complaint.  Will continue to monitor for progression.    Dr. Emily Filbert is Medical Director for [place] and [facility].

## 2017-09-23 ENCOUNTER — Other Ambulatory Visit: Payer: Self-pay

## 2017-09-23 DIAGNOSIS — Z955 Presence of coronary angioplasty implant and graft: Secondary | ICD-10-CM

## 2017-09-23 DIAGNOSIS — I213 ST elevation (STEMI) myocardial infarction of unspecified site: Secondary | ICD-10-CM | POA: Diagnosis not present

## 2017-09-23 LAB — GLUCOSE, CAPILLARY
GLUCOSE-CAPILLARY: 176 mg/dL — AB (ref 65–99)
GLUCOSE-CAPILLARY: 127 mg/dL — AB (ref 65–99)

## 2017-09-23 NOTE — Progress Notes (Signed)
Daily Session Note  Patient Details  Name: Henry [name] MRN: [id number] Date of Birth: Dec 10, 1945 Referring Provider:     Cardiac Rehab from 09/11/2017 in [facility] Cardiac and Pulmonary Rehab  Referring Provider  Paraschos      Encounter Date: 09/23/2017  Check In: Session Check In - 09/23/17 0825      Check-In   Location  [facility]-Cardiac & Pulmonary Rehab    Staff Present  Heath Lark, RN, BSN, CCRP;Jessica Metcalfe, MA, RCEP, CCRP, Exercise Physiologist;Mellony Danziger Oletta Darter, [place], ACSM CEP, Exercise Physiologist    Supervising physician immediately available to respond to emergencies  See telemetry face sheet for immediately available ER MD    Medication changes reported      No    Fall or balance concerns reported     No    Warm-up and Cool-down  Performed on first and last piece of equipment    Resistance Training Performed  Yes    VAD Patient?  No      Pain Assessment   Currently in Pain?  No/denies    Multiple Pain Sites  No          Social History   Tobacco Use  Smoking Status Former Smoker  . Packs/day: 1.00  . Years: 50.00  . Pack years: 50.00  . Types: Cigarettes  . Last attempt to quit: 08/2017  . Years since quitting: 0.1  Smokeless Tobacco Never Used  Tobacco Comment   Has Quit as of May 2019- reviewed relaspe and will continue to followup    Goals Met:  Independence with exercise equipment Exercise tolerated well No report of cardiac concerns or symptoms Strength training completed today  Goals Unmet:  Not Applicable  Comments: Reviewed home exercise with pt today.  Pt plans to walk outside for exercise.  Reviewed THR, pulse, RPE, sign and symptoms, NTG use, and when to call 911 or MD.  Also discussed weather considerations and indoor options.  Pt voiced understanding.    Dr. Emily Filbert is Medical Director for [place] and [facility].

## 2017-09-23 NOTE — Patient Outreach (Signed)
[facility]) Care Management   09/23/2017  Henry [name] July 11, 1945 [id number]  Henry [name] is an 72 y.o. male  Subjective: "I am feeling much better".  "My energy level is coming back".  "I can breath better since I quit smoking".  Objective:   BP (!) 106/56 (BP Location: Right Arm, Patient Position: Sitting, Cuff Size: Normal)   Pulse 74   Resp 18   Wt 208 lb (94.3 kg) Comment: today in cardiac rehab  SpO2 94%   BMI 29.01 kg/m      Physical Exam  Constitutional: He is oriented to person, place, and time. He appears well-developed and well-nourished.  Cardiovascular: Normal rate, regular rhythm, normal heart sounds and intact distal pulses.  Respiratory: Effort normal and breath sounds normal.  GI: Soft. Bowel sounds are normal.  Neurological: He is alert and oriented to person, place, and time.  Skin: Skin is warm and dry.  Scattered ecchymosis on arms from yardwork  Psychiatric: He has a normal mood and affect. His behavior is normal. Judgment and thought content normal.    Encounter Medications:   Outpatient Encounter Medications as of 09/23/2017  Medication Sig Note  . albuterol (PROVENTIL HFA;VENTOLIN HFA) 108 (90 Base) MCG/ACT inhaler Inhale 2 puffs into the lungs every 6 (six) hours as needed for wheezing or shortness of breath.   Marland Kitchen aspirin EC 81 MG EC tablet Take 1 tablet (81 mg total) by mouth daily.   Marland Kitchen atorvastatin (LIPITOR) 80 MG tablet Take 1 tablet (80 mg total) by mouth daily at 6 PM.   . cetirizine (ZYRTEC) 10 MG tablet Take 10 mg by mouth daily.   . fluticasone (FLONASE) 50 MCG/ACT nasal spray Place 1 spray into both nostrils daily.   . insulin detemir (LEVEMIR) 100 UNIT/ML injection Inject 0.5 mLs (50 Units total) into the skin 2 (two) times daily. 08/25/2017: Per pt taking 90 units in am and pm   . irbesartan (AVAPRO) 300 MG tablet Take 1 tablet (300 mg total) by mouth daily.   . metoprolol tartrate (LOPRESSOR) 25 MG tablet  Take 1 tablet (25 mg total) by mouth 2 (two) times daily.   . montelukast (SINGULAIR) 10 MG tablet Take 1 tablet by mouth daily. 08/20/2017: Per pt takes at bedtime   . multivitamin-iron-minerals-folic acid (CENTRUM) chewable tablet Chew 1 tablet by mouth daily.   . nicotine (NICODERM CQ - DOSED IN MG/24 HOURS) 14 mg/24hr patch Place 1 patch (14 mg total) onto the skin daily. 09/11/2017: Is weaning down the patch to be completely off  . sitaGLIPtin-metformin (JANUMET) 50-1000 MG tablet Take 1 tablet by mouth 2 (two) times daily with a meal.   . ticagrelor (BRILINTA) 90 MG TABS tablet Take 1 tablet (90 mg total) by mouth 2 (two) times daily.   Marland Kitchen acetaminophen (TYLENOL) 325 MG tablet Take 2 tablets (650 mg total) by mouth every 6 (six) hours as needed for mild pain (or Fever >/= 101). (Patient not taking: Reported on 08/20/2017)   . HYDROcodone-acetaminophen (NORCO/VICODIN) 5-325 MG tablet Take 1 tablet by mouth every 6 (six) hours as needed. Pt to take twice a day as needed.   . loratadine (CLARITIN) 10 MG tablet Take 1 tablet (10 mg total) by mouth daily as needed for allergies. (Patient not taking: Reported on 08/20/2017)   . nitroGLYCERIN (NITROSTAT) 0.4 MG SL tablet Place 1 tablet (0.4 mg total) under the tongue every 5 (five) minutes as needed for chest pain. (Patient not taking: Reported on  09/23/2017) 09/23/2017: Not taking but available if needed   No facility-administered encounter medications on file as of 09/23/2017.     Functional Status:   In your present state of health, do you have any difficulty performing the following activities: 08/25/2017 08/15/2017  Hearing? N -  Vision? N -  Difficulty concentrating or making decisions? N N  Walking or climbing stairs? N N  Dressing or bathing? N N  Doing errands, shopping? N N  Preparing Food and eating ? N N  Using the Toilet? N N  In the past six months, have you accidently leaked urine? N N  Do you have problems with loss of bowel control? N N   Managing your Medications? N N  Managing your Finances? N N  Housekeeping or managing your Housekeeping? N N  Some recent data might be hidden    Fall/Depression Screening:    Fall Risk  09/11/2017 08/15/2017  Falls in the past year? Yes Yes  Number falls in past yr: 1 1  Injury with Fall? No No  Risk for fall due to : History of fall(s) Other (Comment)  Risk for fall due to: Comment - reports no fall risks today  Follow up Falls prevention discussed Falls prevention discussed   PHQ 2/9 Scores 09/11/2017 08/25/2017  PHQ - 2 Score 0 0  PHQ- 9 Score 2 -    Assessment:  72 year old male referred for care management during recent hospitalization, May 5-9 for STEMI, PCI/stent to LAD on May 5 and orbital atherectomy and placement of DES to the RCA on May 8. History includes but not limited to HTN, HLD, DM2, CKD, RA, and CAD.  CAD: Patient is doing well. He has enrolled in cardiac rehab and attends regularly on Tuesdays and Thursdays for moderate aerobic exercise and resistance training. His enrollment in the program will extend to 36 sessions which will carry him through 4 months. He states he is enjoying the program and at todays sessions he was given his home exercise prescription. He continues to walk daily for approximately 30 min. He understands weather limitations to exercising safely outdoors such as temperature and humidity. He has an indoor plan if weather is not appropriate for exercise. He is taking all medications as prescribed including Brillinta, carries his nitroglycerin with him at all times and understands when and how to use. He denies any chest pain or pressure since returning home from the hospital. RN CM did notice mild scattered bruising on patients arms bilat. We discussed how Brillinta works within the body and side effects. Patient was cautioned about bleeding and bruising more easily and safety precautions when gardening or using sharp items. He verbalizes his BP goal of <130/80  and his cholesterol goal of <200. He continues to be an ex-smoker. He is loosing weight by eating a Heart Healthy/diabetic diet. His weight today was 208.   DM2: Patient continues to check his sugars and record QID. Today's fasting am sugar was 115, at cardiac rehab after breakfast was 160, immediately after exercise 130. He states his morning sugars consistantly range from 105-115, before lunch 130s, before dinner 120s, and at HS 120-130. [employer] recently sent him an A1C home test kit which he completed. His results were 6.5. He continues to be compliant with all diabetic medications. He recently has a PCP visit for DM follow-up and states Dr. Lovie Macadamia was very pleases with how well he was managing his diabetes.   Follow-up Appointments: 09/26/17 PCP at [facility] [place], [place]  [place] 6 month check up   Plan: Follow up telephonically next month and if no additional CM needs, close case   [facility] CM Care Plan Problem One     Most Recent Value  Care Plan Problem One  Risk for hospital readmission related to/ as evidenced by recent hospitalization May 5-9, 2019 for STEMI  Role Documenting the Problem One  Care Management [place] for Problem One  Active  [facility] Long Term Goal   Over the next 31 days, patient will not experience hospital readmission, as evidenced by patient reporting and review of EMR during [facility] RN CCM outreach  [facility] Long Term Goal Start Date  08/15/17  [facility] Long Term Goal Met Date  09/23/17  [facility] CM Short Term Goal #1   re established -Over the next 7 days, patient will schedule post-hospital discharge PCP appointment, as evidenced by patient reporting and collaboration with PCP office team during [facility] RN CCm outreach  [facility] CM Short Term Goal #1 Start Date  08/20/17 [facility] reset ]  [facility] CM Short Term Goal #1 Met Date  09/04/17  [facility] CM Short Term Goal #2   Pt would take all medications as ordered for the next 25 days   [facility] CM Short Term Goal #2 Start Date  09/04/17  [facility] CM Short Term Goal  #2 Met Date  09/23/17      [employer] Clinkscale E. Rollene Rotunda RN, BSN [facility] Care Management Coordinator [phone]

## 2017-09-24 ENCOUNTER — Encounter: Payer: Self-pay | Admitting: *Deleted

## 2017-09-24 DIAGNOSIS — I213 ST elevation (STEMI) myocardial infarction of unspecified site: Secondary | ICD-10-CM

## 2017-09-24 DIAGNOSIS — Z955 Presence of coronary angioplasty implant and graft: Secondary | ICD-10-CM

## 2017-09-24 NOTE — Progress Notes (Signed)
Cardiac Individual Treatment Plan  Patient Details  Name: Henry [name] MRN: [id number] Date of Birth: [birth year] Referring Provider:     Cardiac Rehab from 09/11/2017 in [facility] Cardiac and Pulmonary Rehab  Referring Provider  Paraschos      Initial Encounter Date:    Cardiac Rehab from 09/11/2017 in [facility] Cardiac and Pulmonary Rehab  Date  09/11/17  Referring Provider  Paraschos      Visit Diagnosis: ST elevation myocardial infarction (STEMI), unspecified artery ([place])  Status post coronary artery stent placement  Patient's Home Medications on Admission:  Current Outpatient Medications:  .  acetaminophen (TYLENOL) 325 MG tablet, Take 2 tablets (650 mg total) by mouth every 6 (six) hours as needed for mild pain (or Fever >/= 101). (Patient not taking: Reported on 08/20/2017), Disp: , Rfl:  .  albuterol (PROVENTIL HFA;VENTOLIN HFA) 108 (90 Base) MCG/ACT inhaler, Inhale 2 puffs into the lungs every 6 (six) hours as needed for wheezing or shortness of breath., Disp: 1 Inhaler, Rfl: 0 .  aspirin EC 81 MG EC tablet, Take 1 tablet (81 mg total) by mouth daily., Disp: , Rfl:  .  atorvastatin (LIPITOR) 80 MG tablet, Take 1 tablet (80 mg total) by mouth daily at 6 PM., Disp: 30 tablet, Rfl: 2 .  cetirizine (ZYRTEC) 10 MG tablet, Take 10 mg by mouth daily., Disp: , Rfl:  .  fluticasone (FLONASE) 50 MCG/ACT nasal spray, Place 1 spray into both nostrils daily., Disp: , Rfl:  .  HYDROcodone-acetaminophen (NORCO/VICODIN) 5-325 MG tablet, Take 1 tablet by mouth every 6 (six) hours as needed. Pt to take twice a day as needed., Disp: , Rfl:  .  insulin detemir (LEVEMIR) 100 UNIT/ML injection, Inject 0.5 mLs (50 Units total) into the skin 2 (two) times daily., Disp: 10 mL, Rfl: 11 .  irbesartan (AVAPRO) 300 MG tablet, Take 1 tablet (300 mg total) by mouth daily., Disp: 30 tablet, Rfl: 2 .  loratadine (CLARITIN) 10 MG tablet, Take 1 tablet (10 mg total) by mouth daily as needed for allergies. (Patient not  taking: Reported on 08/20/2017), Disp: 30 tablet, Rfl: 0 .  metoprolol tartrate (LOPRESSOR) 25 MG tablet, Take 1 tablet (25 mg total) by mouth 2 (two) times daily., Disp: 60 tablet, Rfl: 2 .  montelukast (SINGULAIR) 10 MG tablet, Take 1 tablet by mouth daily., Disp: , Rfl:  .  multivitamin-iron-minerals-folic acid (CENTRUM) chewable tablet, Chew 1 tablet by mouth daily., Disp: , Rfl:  .  nicotine (NICODERM CQ - DOSED IN MG/24 HOURS) 14 mg/24hr patch, Place 1 patch (14 mg total) onto the skin daily., Disp: 28 patch, Rfl: 0 .  nitroGLYCERIN (NITROSTAT) 0.4 MG SL tablet, Place 1 tablet (0.4 mg total) under the tongue every 5 (five) minutes as needed for chest pain. (Patient not taking: Reported on 09/23/2017), Disp: 25 tablet, Rfl: 2 .  sitaGLIPtin-metformin (JANUMET) 50-1000 MG tablet, Take 1 tablet by mouth 2 (two) times daily with a meal., Disp: , Rfl:  .  ticagrelor (BRILINTA) 90 MG TABS tablet, Take 1 tablet (90 mg total) by mouth 2 (two) times daily., Disp: 60 tablet, Rfl: 0  Past Medical History: Past Medical History:  Diagnosis Date  . Arthritis   . CAD (coronary artery disease)    a. anterior stemi 08/10/2017; b. LHC 08/10/17: pLAD 70, p-mLAD 99 thrombotic, ostD1 70, p-mLCx 30, pRCA-1 50 calcified, pRCA-2 99 calcified, dRCA 30. Successful PCI/DES to p-mLAD x 2  . Cancer ([place])    skin  . CKD (  chronic kidney disease), stage II   . Glaucoma   . Hyperlipidemia   . Hypertension   . Insulin dependent diabetes mellitus ([place])   . Ischemic cardiomyopathy    a. TTE 08/10/17: technically difficult study, no diagnostic RWMA, EF 40-50%, poor windows, very difficult study   . S/P angioplasty with stent 08/10/17 emergently to LAD ,  08/13/17 atherectomy and stent to pRCA  08/14/2017    Tobacco Use: Social History   Tobacco Use  Smoking Status Former Smoker  . Packs/day: 1.00  . Years: 50.00  . Pack years: 50.00  . Types: Cigarettes  . Last attempt to quit: 08/2017  . Years since quitting: 0.1    Smokeless Tobacco Never Used  Tobacco Comment   Has Quit as of May 2019- reviewed relaspe and will continue to followup    Labs: Recent Review Flowsheet Data    Labs for ITP Cardiac and Pulmonary Rehab Latest Ref Rng & Units 01/25/2016 01/26/2016 01/27/2016 08/10/2017   Cholestrol 0 - 200 mg/dL - - - 132   LDLCALC 0 - 99 mg/dL - - - 65   HDL >40 mg/dL - - - 35(L)   Trlycerides <150 mg/dL - - - 159(H)   Hemoglobin A1c 4.8 - 5.6 % - 6.7(H) 6.9(H) 6.7(H)   HCO3 20.0 - 28.0 mmol/L 30.4(H) - - -       Exercise Target Goals:    Exercise Program Goal: Individual exercise prescription set using results from initial 6 min walk test and THRR while considering  patient's activity barriers and safety.   Exercise Prescription Goal: Initial exercise prescription builds to 30-45 minutes a day of aerobic activity, 2-3 days per week.  Home exercise guidelines will be given to patient during program as part of exercise prescription that the participant will acknowledge.  Activity Barriers & Risk Stratification: Activity Barriers & Cardiac Risk Stratification - 09/11/17 1324      Activity Barriers & Cardiac Risk Stratification   Activity Barriers  Arthritis Feet, knees and hands    Cardiac Risk Stratification  Moderate       6 Minute Walk: 6 Minute Walk    Row Name 09/11/17 1319         6 Minute Walk   Distance  1058 feet     Walk Time  6 minutes     # of Rest Breaks  0     MPH  2     METS  2.15     RPE  11     Perceived Dyspnea   0     VO2 Peak  7.53     Symptoms  Yes (comment)     Comments  knee pain 1/10     Resting HR  69 bpm     Resting BP  104/80     Resting Oxygen Saturation   92 %     Exercise Oxygen Saturation  during 6 min walk  93 %     Max Ex. HR  86 bpm     Max Ex. BP  126/58     2 Minute Post BP  118/68        Oxygen Initial Assessment:   Oxygen Re-Evaluation:   Oxygen Discharge (Final Oxygen Re-Evaluation):   Initial Exercise Prescription: Initial  Exercise Prescription - 09/11/17 1300      Date of Initial Exercise RX and Referring Provider   Date  09/11/17    Referring Provider  Paraschos      Treadmill  MPH  2    Grade  0    Minutes  15    METs  2.15      Recumbant Bike   Level  1    RPM  60    Watts  5    Minutes  15    METs  2      T5 Nustep   Level  1    SPM  80    Minutes  15    METs  2      Prescription Details   Frequency (times per week)  3    Duration  Progress to 30 minutes of continuous aerobic without signs/symptoms of physical distress      Intensity   THRR 40-80% of Max Heartrate  100-132    Ratings of Perceived Exertion  11-13    Perceived Dyspnea  0-4      Resistance Training   Training Prescription  Yes    Weight  4 lb    Reps  10-15       Perform Capillary Blood Glucose checks as needed.  Exercise Prescription Changes: Exercise Prescription Changes    Row Name 09/11/17 1300 09/16/17 1400 09/23/17 0900         Response to Exercise   Blood Pressure (Admit)  104/80  108/68  108/68     Blood Pressure (Exercise)  126/58  134/70  134/70     Blood Pressure (Exit)  118/68  114/68  114/68     Heart Rate (Admit)  65 bpm  71 bpm  71 bpm     Heart Rate (Exercise)  87 bpm  92 bpm  92 bpm     Heart Rate (Exit)  79 bpm  76 bpm  76 bpm     Oxygen Saturation (Admit)  94 %  -  -     Oxygen Saturation (Exercise)  92 %  -  -     Oxygen Saturation (Exit)  95 %  -  -     Rating of Perceived Exertion (Exercise)  -  15  15     Symptoms  -  none  none     Comments  -  first full day of exercise  first full day of exercise     Duration  -  Continue with 30 min of aerobic exercise without signs/symptoms of physical distress.  Continue with 30 min of aerobic exercise without signs/symptoms of physical distress.     Intensity  -  THRR unchanged  THRR unchanged       Progression   Progression  -  Continue to progress workloads to maintain intensity without signs/symptoms of physical distress.  Continue to  progress workloads to maintain intensity without signs/symptoms of physical distress.     Average METs  -  2.33  2.33       Resistance Training   Training Prescription  -  Yes  Yes     Weight  -  4 lbs  4 lbs     Reps  -  10-15  10-15       Interval Training   Interval Training  -  No  No       Recumbant Bike   Level  -  1  1     Watts  -  18  18     Minutes  -  15  15     METs  -  2.66  2.66  T5 Nustep   Level  -  1  1     Minutes  -  15  15     METs  -  2  2       Home Exercise Plan   Plans to continue exercise at  -  -  Home (comment) walk     Frequency  -  -  Add 2 additional days to program exercise sessions.     Initial Home Exercises Provided  -  -  09/23/17        Exercise Comments: Exercise Comments    Row Name 09/16/17 0903           Exercise Comments  First full day of exercise!  Patient was oriented to gym and equipment including functions, settings, policies, and procedures.  Patient's individual exercise prescription and treatment plan were reviewed.  All starting workloads were established based on the results of the 6 minute walk test done at initial orientation visit.  The plan for exercise progression was also introduced and progression will be customized based on patient's performance and goals.          Exercise Goals and Review: Exercise Goals    Row Name 09/11/17 1316             Exercise Goals   Increase Physical Activity  Yes       Intervention  Provide advice, education, support and counseling about physical activity/exercise needs.;Develop an individualized exercise prescription for aerobic and resistive training based on initial evaluation findings, risk stratification, comorbidities and participant's personal goals.       Expected Outcomes  Short Term: Attend rehab on a regular basis to increase amount of physical activity.;Long Term: Add in home exercise to make exercise part of routine and to increase amount of physical  activity.;Long Term: Exercising regularly at least 3-5 days a week.       Increase Strength and Stamina  Yes       Intervention  Provide advice, education, support and counseling about physical activity/exercise needs.;Develop an individualized exercise prescription for aerobic and resistive training based on initial evaluation findings, risk stratification, comorbidities and participant's personal goals.       Expected Outcomes  Short Term: Increase workloads from initial exercise prescription for resistance, speed, and METs.;Short Term: Perform resistance training exercises routinely during rehab and add in resistance training at home;Long Term: Improve cardiorespiratory fitness, muscular endurance and strength as measured by increased METs and functional capacity (6MWT)       Able to understand and use rate of perceived exertion (RPE) scale  Yes       Intervention  Provide education and explanation on how to use RPE scale       Expected Outcomes  Short Term: Able to use RPE daily in rehab to express subjective intensity level;Long Term:  Able to use RPE to guide intensity level when exercising independently       Able to understand and use Dyspnea scale  Yes       Intervention  Provide education and explanation on how to use Dyspnea scale       Expected Outcomes  Short Term: Able to use Dyspnea scale daily in rehab to express subjective sense of shortness of breath during exertion;Long Term: Able to use Dyspnea scale to guide intensity level when exercising independently       Knowledge and understanding of Target Heart Rate Range (THRR)  Yes       Intervention  Provide education and explanation of THRR including how the numbers were predicted and where they are located for reference       Expected Outcomes  Short Term: Able to state/look up THRR;Short Term: Able to use daily as guideline for intensity in rehab;Long Term: Able to use THRR to govern intensity when exercising independently       Able  to check pulse independently  Yes       Intervention  Provide education and demonstration on how to check pulse in carotid and radial arteries.;Review the importance of being able to check your own pulse for safety during independent exercise       Expected Outcomes  Short Term: Able to explain why pulse checking is important during independent exercise;Long Term: Able to check pulse independently and accurately       Understanding of Exercise Prescription  Yes       Intervention  Provide education, explanation, and written materials on patient's individual exercise prescription       Expected Outcomes  Short Term: Able to explain program exercise prescription;Long Term: Able to explain home exercise prescription to exercise independently          Exercise Goals Re-Evaluation : Exercise Goals Re-Evaluation    Row Name 09/16/17 0903 09/23/17 0939           Exercise Goal Re-Evaluation   Exercise Goals Review  Increase Physical Activity;Able to understand and use rate of perceived exertion (RPE) scale;Knowledge and understanding of Target Heart Rate Range (THRR);Understanding of Exercise Prescription  Increase Physical Activity;Understanding of Exercise Prescription;Knowledge and understanding of Target Heart Rate Range (THRR);Increase Strength and Stamina;Able to understand and use rate of perceived exertion (RPE) scale;Able to check pulse independently      Comments  Reviewed RPE scale, THR and program prescription with pt today.  Pt voiced understanding and was given a copy of goals to take home.   Don walks outside for exercise currently.  He will check his pulse and stretch afterwards. He has arthritis which affects strength training. [facility]      Expected Outcomes  Short: Use RPE daily to regulate intensity.  Long: Follow program prescription in THR.  Short - check pulse while exercising on his own, stretch after walking Long - maintain improved MET level  after completing HT         Discharge Exercise Prescription (Final Exercise Prescription Changes): Exercise Prescription Changes - 09/23/17 0900      Response to Exercise   Blood Pressure (Admit)  108/68    Blood Pressure (Exercise)  134/70    Blood Pressure (Exit)  114/68    Heart Rate (Admit)  71 bpm    Heart Rate (Exercise)  92 bpm    Heart Rate (Exit)  76 bpm    Rating of Perceived Exertion (Exercise)  15    Symptoms  none    Comments  first full day of exercise    Duration  Continue with 30 min of aerobic exercise without signs/symptoms of physical distress.    Intensity  THRR unchanged      Progression   Progression  Continue to progress workloads to maintain intensity without signs/symptoms of physical distress.    Average METs  2.33      Resistance Training   Training Prescription  Yes    Weight  4 lbs    Reps  10-15      Interval Training   Interval Training  No  Recumbant Bike   Level  1    Watts  18    Minutes  15    METs  2.66      T5 Nustep   Level  1    Minutes  15    METs  2      Home Exercise Plan   Plans to continue exercise at  Home (comment) walk    Frequency  Add 2 additional days to program exercise sessions.    Initial Home Exercises Provided  09/23/17       Nutrition:  Target Goals: Understanding of nutrition guidelines, daily intake of sodium <1567m, cholesterol <2068m calories 30% from fat and 7% or less from saturated fats, daily to have 5 or more servings of fruits and vegetables.  Biometrics: Pre Biometrics - 09/11/17 1314      Pre Biometrics   Height  '5\' 11"'  (1.803 m)    Weight  209 lb 6.4 oz (95 kg)    Waist Circumference  43.5 inches    Hip Circumference  42 inches    Waist to Hip Ratio  1.04 %    BMI (Calculated)  29.22    Single Leg Stand  5.2 seconds        Nutrition Therapy Plan and Nutrition Goals: Nutrition Therapy & Goals - 09/11/17 1327      Intervention Plan   Intervention  Prescribe, educate and  counsel regarding individualized specific dietary modifications aiming towards targeted core components such as weight, hypertension, lipid management, diabetes, heart failure and other comorbidities.    Expected Outcomes  Short Term Goal: Understand basic principles of dietary content, such as calories, fat, sodium, cholesterol and nutrients.;Short Term Goal: A plan has been developed with personal nutrition goals set during dietitian appointment.;Long Term Goal: Adherence to prescribed nutrition plan.       Nutrition Assessments: Nutrition Assessments - 09/11/17 1327      MEDFICTS Scores   Pre Score  23       Nutrition Goals Re-Evaluation:   Nutrition Goals Discharge (Final Nutrition Goals Re-Evaluation):   Psychosocial: Target Goals: Acknowledge presence or absence of significant depression and/or stress, maximize coping skills, provide positive support system. Participant is able to verbalize types and ability to use techniques and skills needed for reducing stress and depression.   Initial Review & Psychosocial Screening: Initial Psych Review & Screening - 09/11/17 1331      Initial Review   Current issues with  None Identified      Family Dynamics   Good Support System?  Yes friends      Barriers   Psychosocial barriers to participate in program  The patient should benefit from training in stress management and relaxation.;There are no identifiable barriers or psychosocial needs.      Screening Interventions   Interventions  Encouraged to exercise;Provide feedback about the scores to participant    Expected Outcomes  Short Term goal: Utilizing psychosocial counselor, staff and physician to assist with identification of specific Stressors or current issues interfering with healing process. Setting desired goal for each stressor or current issue identified.;Long Term Goal: Stressors or current issues are controlled or eliminated.;Short Term goal: Identification and review with  participant of any Quality of Life or Depression concerns found by scoring the questionnaire.;Long Term goal: The participant improves quality of Life and PHQ9 Scores as seen by post scores and/or verbalization of changes       Quality of Life Scores:  Quality of Life - 09/11/17 1332  Quality of Life Scores   Health/Function Pre  28.4 %    Socioeconomic Pre  30 %    Psych/Spiritual Pre  30 %    Family Pre  30 %    GLOBAL Pre  29.27 %      Scores of 19 and below usually indicate a poorer quality of life in these areas.  A difference of  2-3 points is a clinically meaningful difference.  A difference of 2-3 points in the total score of the Quality of Life Index has been associated with significant improvement in overall quality of life, self-image, physical symptoms, and general health in studies assessing change in quality of life.  PHQ-9: Recent Review Flowsheet Data    Depression screen [facility] 2/9 09/11/2017 08/25/2017   Decreased Interest 0 0   Down, Depressed, Hopeless 0 0   PHQ - 2 Score 0 0   Altered sleeping 0 -   Tired, decreased energy 2  -   Change in appetite 0 -   Feeling bad or failure about yourself  0 -   Trouble concentrating 0 -   Moving slowly or fidgety/restless 0 -   Suicidal thoughts 0 -   PHQ-9 Score 2 -   Difficult doing work/chores Not difficult at all -     Interpretation of Total Score  Total Score Depression Severity:  1-4 = Minimal depression, 5-9 = Mild depression, 10-14 = Moderate depression, 15-19 = Moderately severe depression, 20-27 = Severe depression   Psychosocial Evaluation and Intervention:   Psychosocial Re-Evaluation:   Psychosocial Discharge (Final Psychosocial Re-Evaluation):   Vocational Rehabilitation: Provide vocational rehab assistance to qualifying candidates.   Vocational Rehab Evaluation & Intervention: Vocational Rehab - 09/11/17 1337      Initial Vocational Rehab Evaluation & Intervention   Assessment shows need for  Vocational Rehabilitation  No       Education: Education Goals: Education classes will be provided on a variety of topics geared toward better understanding of heart health and risk factor modification. Participant will state understanding/return demonstration of topics presented as noted by education test scores.  Learning Barriers/Preferences: Learning Barriers/Preferences - 09/11/17 1336      Learning Barriers/Preferences   Learning Barriers  None    Learning Preferences  None       Education Topics:  AED/CPR: - Group verbal and written instruction with the use of models to demonstrate the basic use of the AED with the basic ABC's of resuscitation.   General Nutrition Guidelines/Fats and Fiber: -Group instruction provided by verbal, written material, models and posters to present the general guidelines for heart healthy nutrition. Gives an explanation and review of dietary fats and fiber.   Controlling Sodium/Reading Food Labels: -Group verbal and written material supporting the discussion of sodium use in heart healthy nutrition. Review and explanation with models, verbal and written materials for utilization of the food label.   Exercise Physiology & General Exercise Guidelines: - Group verbal and written instruction with models to review the exercise physiology of the cardiovascular system and associated critical values. Provides general exercise guidelines with specific guidelines to those with heart or lung disease.    Aerobic Exercise & Resistance Training: - Gives group verbal and written instruction on the various components of exercise. Focuses on aerobic and resistive training programs and the benefits of this training and how to safely progress through these programs..   Cardiac Rehab from 09/23/2017 in [facility] Cardiac and Pulmonary Rehab  Date  09/23/17  Educator  [facility]  Instruction Review Code  1- Geologist, engineering, Balance, Mind/Body  Relaxation: Provides group verbal/written instruction on the benefits of flexibility and balance training, including mind/body exercise modes such as yoga, pilates and tai chi.  Demonstration and skill practice provided.   Stress and Anxiety: - Provides group verbal and written instruction about the health risks of elevated stress and causes of high stress.  Discuss the correlation between heart/lung disease and anxiety and treatment options. Review healthy ways to manage with stress and anxiety.   Depression: - Provides group verbal and written instruction on the correlation between heart/lung disease and depressed mood, treatment options, and the stigmas associated with seeking treatment.   Cardiac Rehab from 09/23/2017 in [facility] Cardiac and Pulmonary Rehab  Date  09/16/17  Educator  [facility]  Instruction Review Code  1- Verbalizes Understanding      Anatomy & Physiology of the Heart: - Group verbal and written instruction and models provide basic cardiac anatomy and physiology, with the coronary electrical and arterial systems. Review of Valvular disease and Heart Failure   Cardiac Procedures: - Group verbal and written instruction to review commonly prescribed medications for heart disease. Reviews the medication, class of the drug, and side effects. Includes the steps to properly store meds and maintain the prescription regimen. (beta blockers and nitrates)   Cardiac Medications I: - Group verbal and written instruction to review commonly prescribed medications for heart disease. Reviews the medication, class of the drug, and side effects. Includes the steps to properly store meds and maintain the prescription regimen.   Cardiac Medications II: -Group verbal and written instruction to review commonly prescribed medications for heart disease. Reviews the medication, class of the drug, and side effects. (all other drug classes)   Cardiac Rehab from 09/23/2017 in [facility] Cardiac and Pulmonary  Rehab  Date  09/18/17  Educator  CE  Instruction Review Code  1- Verbalizes Understanding       Go Sex-Intimacy & Heart Disease, Get SMART - Goal Setting: - Group verbal and written instruction through game format to discuss heart disease and the return to sexual intimacy. Provides group verbal and written material to discuss and apply goal setting through the application of the S.M.A.R.T. Method.   Other Matters of the Heart: - Provides group verbal, written materials and models to describe Stable Angina and Peripheral Artery. Includes description of the disease process and treatment options available to the cardiac patient.   Exercise & Equipment Safety: - Individual verbal instruction and demonstration of equipment use and safety with use of the equipment.   Cardiac Rehab from 09/23/2017 in [facility] Cardiac and Pulmonary Rehab  Date  09/11/17  Educator  Sb  Instruction Review Code  1- Verbalizes Understanding      Infection Prevention: - Provides verbal and written material to individual with discussion of infection control including proper hand washing and proper equipment cleaning during exercise session.   Cardiac Rehab from 09/23/2017 in [facility] Cardiac and Pulmonary Rehab  Date  09/11/17  Educator  SB  Instruction Review Code  1- Verbalizes Understanding      Falls Prevention: - Provides verbal and written material to individual with discussion of falls prevention and safety.   Cardiac Rehab from 09/23/2017 in [facility] Cardiac and Pulmonary Rehab  Date  09/11/17  Educator  SB  Instruction Review Code  1- Verbalizes Understanding      Diabetes: - Individual verbal and written instruction to review signs/symptoms of diabetes,  desired ranges of glucose level fasting, after meals and with exercise. Acknowledge that pre and post exercise glucose checks will be done for 3 sessions at entry of program.   Cardiac Rehab from 09/23/2017 in [facility] Cardiac and Pulmonary Rehab  Date  09/11/17   Educator  SB  Instruction Review Code  1- Verbalizes Understanding      Know Your Numbers and Risk Factors: -Group verbal and written instruction about important numbers in your health.  Discussion of what are risk factors and how they play a role in the disease process.  Review of Cholesterol, Blood Pressure, Diabetes, and BMI and the role they play in your overall health.   Cardiac Rehab from 09/23/2017 in [facility] Cardiac and Pulmonary Rehab  Date  09/18/17  Educator  CE  Instruction Review Code  1- Verbalizes Understanding      Sleep Hygiene: -Provides group verbal and written instruction about how sleep can affect your health.  Define sleep hygiene, discuss sleep cycles and impact of sleep habits. Review good sleep hygiene tips.    Other: -Provides group and verbal instruction on various topics (see comments)   Knowledge Questionnaire Score: Knowledge Questionnaire Score - 09/11/17 1336      Knowledge Questionnaire Score   Pre Score  15/26 Correct responses were reviewed with Elenore Rota today. He verbalized understanding of the responses and had no furhter questions       Core Components/Risk Factors/Patient Goals at Admission: Personal Goals and Risk Factors at Admission - 09/11/17 1330      Core Components/Risk Factors/Patient Goals on Admission    Weight Management  Yes;Weight Loss;Obesity    Intervention  Weight Management: Develop a combined nutrition and exercise program designed to reach desired caloric intake, while maintaining appropriate intake of nutrient and fiber, sodium and fats, and appropriate energy expenditure required for the weight goal.;Weight Management/Obesity: Establish reasonable short term and long term weight goals.    Admit Weight  209 lb (94.8 kg)    Goal Weight: Short Term  207 lb (93.9 kg)    Goal Weight: Long Term  205 lb (93 kg)    Expected Outcomes  Short Term: Continue to assess and modify interventions until short term weight is achieved;Long  Term: Adherence to nutrition and physical activity/exercise program aimed toward attainment of established weight goal;Weight Loss: Understanding of general recommendations for a balanced deficit meal plan, which promotes 1-2 lb weight loss per week and includes a negative energy balance of [phone] kcal/d    Tobacco Cessation  Yes    Intervention  Assist the participant in steps to quit. Provide individualized education and counseling about committing to Tobacco Cessation, relapse prevention, and pharmacological support that can be provided by physician.;Advice worker, assist with locating and accessing local/national Quit Smoking programs, and support quit date choice.    Expected Outcomes  Short Term: Will demonstrate readiness to quit, by selecting a quit date.;Short Term: Will quit all tobacco product use, adhering to prevention of relapse plan.;Long Term: Complete abstinence from all tobacco products for at least 12 months from quit date. HAs quit as of May 2019, using nicotine patches to wean off.      Diabetes  Yes    Intervention  Provide education about signs/symptoms and action to take for hypo/hyperglycemia.;Provide education about proper nutrition, including hydration, and aerobic/resistive exercise prescription along with prescribed medications to achieve blood glucose in normal ranges: Fasting glucose 65-99 mg/dL    Expected Outcomes  Short Term: Participant verbalizes understanding of  the signs/symptoms and immediate care of hyper/hypoglycemia, proper foot care and importance of medication, aerobic/resistive exercise and nutrition plan for blood glucose control.;Long Term: Attainment of HbA1C < 7%.    Hypertension  Yes    Intervention  Provide education on lifestyle modifcations including regular physical activity/exercise, weight management, moderate sodium restriction and increased consumption of fresh fruit, vegetables, and low fat dairy, alcohol moderation, and smoking  cessation.;Monitor prescription use compliance.    Expected Outcomes  Short Term: Continued assessment and intervention until BP is < 140/50m HG in hypertensive participants. < 130/891mHG in hypertensive participants with diabetes, heart failure or chronic kidney disease.;Long Term: Maintenance of blood pressure at goal levels.    Lipids  Yes    Intervention  Provide education and support for participant on nutrition & aerobic/resistive exercise along with prescribed medications to achieve LDL <7045mHDL >49m71m  Expected Outcomes  Short Term: Participant states understanding of desired cholesterol values and is compliant with medications prescribed. Participant is following exercise prescription and nutrition guidelines.;Long Term: Cholesterol controlled with medications as prescribed, with individualized exercise RX and with personalized nutrition plan. Value goals: LDL < 70mg4mL > 40 mg.       Core Components/Risk Factors/Patient Goals Review:    Core Components/Risk Factors/Patient Goals at Discharge (Final Review):    ITP Comments: ITP Comments    Row Name 09/11/17 1320 09/24/17 0601         ITP Comments  Medical review completed. ITP sent to Dr M MilLoleta Chancereview, changes as needed and signnature.  Documentation of diagnosis can be found in [facility]  30 day review. Continue with ITP unless directed changes per Medical Director review  New to program         Comments:

## 2017-09-25 ENCOUNTER — Encounter: Payer: Medicare HMO | Admitting: *Deleted

## 2017-09-25 ENCOUNTER — Ambulatory Visit: Payer: Medicare HMO

## 2017-09-25 DIAGNOSIS — I213 ST elevation (STEMI) myocardial infarction of unspecified site: Secondary | ICD-10-CM | POA: Diagnosis not present

## 2017-09-25 DIAGNOSIS — Z955 Presence of coronary angioplasty implant and graft: Secondary | ICD-10-CM | POA: Diagnosis not present

## 2017-09-25 NOTE — Progress Notes (Signed)
Daily Session Note  Patient Details  Name: Henry [name] MRN: [id number] Date of Birth: 07/03/1945 Referring Provider:     Cardiac Rehab from 09/11/2017 in [facility] Cardiac and Pulmonary Rehab  Referring Provider  Paraschos      Encounter Date: 09/25/2017  Check In: Session Check In - 09/25/17 0816      Check-In   Location  [facility]-Cardiac & Pulmonary Rehab    Staff Present   , MA, RCEP, CCRP, Exercise Physiologist;Amanda Sommer, BA, ACSM CEP, Exercise Physiologist;Carroll Enterkin, RN, BSN    Supervising physician immediately available to respond to emergencies  See telemetry face sheet for immediately available ER MD    Medication changes reported      No    Fall or balance concerns reported     No    Warm-up and Cool-down  Performed on first and last piece of equipment    Resistance Training Performed  Yes    VAD Patient?  No      Pain Assessment   Currently in Pain?  No/denies          Social History   Tobacco Use  Smoking Status Former Smoker  . Packs/day: 1.00  . Years: 50.00  . Pack years: 50.00  . Types: Cigarettes  . Last attempt to quit: 08/2017  . Years since quitting: 0.1  Smokeless Tobacco Never Used  Tobacco Comment   Has Quit as of May 2019- reviewed relaspe and will continue to followup    Goals Met:  Independence with exercise equipment Exercise tolerated well No report of cardiac concerns or symptoms Strength training completed today  Goals Unmet:  Not Applicable  Comments: Pt able to follow exercise prescription today without complaint.  Will continue to monitor for progression.    Dr. Mark Miller is Medical Director for [facility] and [facility]. 

## 2017-09-30 DIAGNOSIS — I213 ST elevation (STEMI) myocardial infarction of unspecified site: Secondary | ICD-10-CM

## 2017-09-30 DIAGNOSIS — M65331 Trigger finger, right middle finger: Secondary | ICD-10-CM | POA: Diagnosis not present

## 2017-09-30 DIAGNOSIS — Z955 Presence of coronary angioplasty implant and graft: Secondary | ICD-10-CM | POA: Diagnosis not present

## 2017-09-30 DIAGNOSIS — M79642 Pain in left hand: Secondary | ICD-10-CM | POA: Diagnosis not present

## 2017-09-30 DIAGNOSIS — M79641 Pain in right hand: Secondary | ICD-10-CM | POA: Diagnosis not present

## 2017-09-30 NOTE — Progress Notes (Signed)
Daily Session Note  Patient Details  Name: Henry [name] MRN: [id number] Date of Birth: [birth year] Referring Provider:     Cardiac Rehab from 09/11/2017 in [facility] Cardiac and Pulmonary Rehab  Referring Provider  Paraschos      Encounter Date: 09/30/2017  Check In: Session Check In - 09/30/17 0838      Check-In   Location  [facility]-Cardiac & Pulmonary Rehab    Staff Present  Heath Lark, RN, BSN, CCRP;Jessica Torrance, MA, RCEP, CCRP, Exercise Physiologist;Amanda Oletta Darter, [place], ACSM CEP, Exercise Physiologist    Supervising physician immediately available to respond to emergencies  See telemetry face sheet for immediately available ER MD    Medication changes reported      No    Fall or balance concerns reported     No    Warm-up and Cool-down  Performed on first and last piece of equipment    Resistance Training Performed  Yes    VAD Patient?  No    PAD/SET Patient?  No      Pain Assessment   Currently in Pain?  No/denies    Multiple Pain Sites  No          Social History   Tobacco Use  Smoking Status Former Smoker  . Packs/day: 1.00  . Years: 50.00  . Pack years: 50.00  . Types: Cigarettes  . Last attempt to quit: 08/2017  . Years since quitting: 0.1  Smokeless Tobacco Never Used  Tobacco Comment   Has Quit as of May 2019- reviewed relaspe and will continue to followup    Goals Met:  Independence with exercise equipment Exercise tolerated well No report of cardiac concerns or symptoms Strength training completed today  Goals Unmet:  Not Applicable  Comments: Pt able to follow exercise prescription today without complaint.  Will continue to monitor for progression.    Dr. Emily Filbert is Medical Director for [place] and [facility].

## 2017-10-01 DIAGNOSIS — M79672 Pain in left foot: Secondary | ICD-10-CM | POA: Diagnosis not present

## 2017-10-01 DIAGNOSIS — M79671 Pain in right foot: Secondary | ICD-10-CM | POA: Diagnosis not present

## 2017-10-01 DIAGNOSIS — M779 Enthesopathy, unspecified: Secondary | ICD-10-CM | POA: Diagnosis not present

## 2017-10-02 DIAGNOSIS — Z955 Presence of coronary angioplasty implant and graft: Secondary | ICD-10-CM | POA: Diagnosis not present

## 2017-10-02 DIAGNOSIS — I213 ST elevation (STEMI) myocardial infarction of unspecified site: Secondary | ICD-10-CM

## 2017-10-02 NOTE — Progress Notes (Signed)
Daily Session Note  Patient Details  Name: Henry [name] MRN: [id number] Date of Birth: 07/01/1945 Referring Provider:     Cardiac Rehab from 09/11/2017 in [facility] Cardiac and Pulmonary Rehab  Referring Provider  Paraschos      Encounter Date: 10/02/2017  Check In: Session Check In - 10/02/17 0816      Check-In   Location  [facility]-Cardiac & Pulmonary Rehab    Staff Present  Justin Mend RCP,RRT,BSRT;Carroll Enterkin, RN, Levie Heritage, MA, RCEP, CCRP, Exercise Physiologist    Supervising physician immediately available to respond to emergencies  See telemetry face sheet for immediately available ER MD    Medication changes reported      No    Fall or balance concerns reported     No    Tobacco Cessation  No Change    Warm-up and Cool-down  Performed on first and last piece of equipment    Resistance Training Performed  Yes    VAD Patient?  No    PAD/SET Patient?  No      Pain Assessment   Currently in Pain?  No/denies        Exercise Prescription Changes - 10/01/17 1500      Response to Exercise   Blood Pressure (Admit)  110/62    Blood Pressure (Exercise)  138/60    Blood Pressure (Exit)  126/64    Heart Rate (Admit)  66 bpm    Heart Rate (Exercise)  116 bpm    Heart Rate (Exit)  70 bpm    Rating of Perceived Exertion (Exercise)  12    Symptoms  none    Duration  Continue with 30 min of aerobic exercise without signs/symptoms of physical distress.    Intensity  THRR unchanged      Progression   Progression  Continue to progress workloads to maintain intensity without signs/symptoms of physical distress.    Average METs  2.34      Resistance Training   Training Prescription  Yes    Weight  5 lbs    Reps  10-15      Interval Training   Interval Training  No      Treadmill   MPH  1.1    Grade  1    Minutes  15    METs  1.99      Recumbant Bike   Level  1    Watts  26    Minutes  15    METs  2.83      T5 Nustep   Level  1    Minutes  15    METs   2      Home Exercise Plan   Plans to continue exercise at  Home (comment) walk    Frequency  Add 2 additional days to program exercise sessions.    Initial Home Exercises Provided  09/23/17       Social History   Tobacco Use  Smoking Status Former Smoker  . Packs/day: 1.00  . Years: 50.00  . Pack years: 50.00  . Types: Cigarettes  . Last attempt to quit: 08/2017  . Years since quitting: 0.1  Smokeless Tobacco Never Used  Tobacco Comment   Has Quit as of May 2019- reviewed relaspe and will continue to followup    Goals Met:  Independence with exercise equipment Exercise tolerated well No report of cardiac concerns or symptoms Strength training completed today  Goals Unmet:  Not Applicable  Comments: Pt able  to follow exercise prescription today without complaint.  Will continue to monitor for progression.   Dr. Emily Filbert is Medical Director for [place] and [facility].

## 2017-10-07 ENCOUNTER — Encounter: Payer: Medicare HMO | Attending: Cardiology | Admitting: *Deleted

## 2017-10-07 DIAGNOSIS — Z955 Presence of coronary angioplasty implant and graft: Secondary | ICD-10-CM

## 2017-10-07 DIAGNOSIS — I213 ST elevation (STEMI) myocardial infarction of unspecified site: Secondary | ICD-10-CM | POA: Insufficient documentation

## 2017-10-07 NOTE — Progress Notes (Signed)
Daily Session Note  Patient Details  Name: Henry [name] MRN: [id number] Date of Birth: 24-Sep-1945 Referring Provider:     Cardiac Rehab from 09/11/2017 in [facility] Cardiac and Pulmonary Rehab  Referring Provider  Paraschos      Encounter Date: 10/07/2017  Check In: Session Check In - 10/07/17 0918      Check-In   Location  [facility]-Cardiac & Pulmonary Rehab    Staff Present  Joellyn Rued, BS, PEC;Carroll Enterkin, RN, BSN;Willye Javier [place], MA, RCEP, [employer], Exercise Physiologist;Susanne Bice, RN, BSN, [employer] physician immediately available to respond to emergencies  See telemetry face sheet for immediately available ER MD    Medication changes reported      No    Fall or balance concerns reported     No    Warm-up and Cool-down  Performed on first and last piece of equipment    Resistance Training Performed  Yes    VAD Patient?  No    PAD/SET Patient?  No      Pain Assessment   Currently in Pain?  No/denies          Social History   Tobacco Use  Smoking Status Former Smoker  . Packs/day: 1.00  . Years: 50.00  . Pack years: 50.00  . Types: Cigarettes  . Last attempt to quit: 08/2017  . Years since quitting: 0.1  Smokeless Tobacco Never Used  Tobacco Comment   Has Quit as of May 2019- reviewed relaspe and will continue to followup    Goals Met:  Independence with exercise equipment Exercise tolerated well No report of cardiac concerns or symptoms Strength training completed today  Goals Unmet:  Not Applicable  Comments: Pt able to follow exercise prescription today without complaint.  Will continue to monitor for progression.    Dr. Emily Filbert is Medical Director for [place] and [facility].

## 2017-10-08 ENCOUNTER — Other Ambulatory Visit: Payer: Self-pay | Admitting: Cardiology

## 2017-10-13 ENCOUNTER — Other Ambulatory Visit: Payer: Self-pay

## 2017-10-13 NOTE — Patient Outreach (Signed)
[facility]) Care Management  10/13/2017  Henry [name] [birth year] [id number]   Successful telephone encounter to the above patient. Patient states he is doing well. Continues to enjoy Cardiac Rehab twice a week and feels he is regaining his strength and stamina. States he will continue Cardiac rehab until September. Patient is scheduled to see a dietician for his CAD and DM on 10/21/17 as part of a benefit through the CR program. RN CM reinforced importance of adherence to heart healthy diabetic diet.  Patient denies chest pain or pressure since last hospitalization. Able to verbalize when to use NTG SL and how to use correctly. Continues to take all medications as prescribed. [facility]. Will now [place] metoprolol, atorvastatin, [place]m [place]   Continues to attempt to exercise daily as prescribed by CR, limited by humidity and heat. Encouraged patient to utilize "big box stores" for his daily exercise. [facility] changes and he is able to exercise outdoors.  Checking CBG TID. Today's fasting AM blood glucose 110. Patient did not weigh today. States he follows his weight at CR. Patient encouraged to weigh daily related to a dx of ischemic CM and low EF. Patient also encouraged to check BP daily for HTN and record. [facility] and encouraged to use a tool for documenting above information.  Patient states he did seek out care from his Rheumatologist for hand pain and trigger finger discomfort. States he received a steroid injection and his RA joint pain and trigger finger have almost resolve. He is able to hold weights at CR without discomfort.  Patient has met all goals he established [place] Discussed discharge at today's visit and patient agrees. Patient has been educated and given  tools to utilize in order to be successful in managing his CAD. He commits to continuing CR until successful completion of the program. Patient reminded that he could self refer in the event of further need for [facility].   Plan: Case Closure  Henry Campanella E. PRollene RotundaRN, BSN TC[facility] IncCare Management Coordinator [phone]

## 2017-10-14 DIAGNOSIS — I213 ST elevation (STEMI) myocardial infarction of unspecified site: Secondary | ICD-10-CM

## 2017-10-14 DIAGNOSIS — Z955 Presence of coronary angioplasty implant and graft: Secondary | ICD-10-CM | POA: Diagnosis not present

## 2017-10-14 NOTE — Progress Notes (Signed)
Daily Session Note  Patient Details  Name: Henry [name] MRN: [id number] Date of Birth: Sep 24, 1945 Referring Provider:     Cardiac Rehab from 09/11/2017 in [facility] Cardiac and Pulmonary Rehab  Referring Provider  Paraschos      Encounter Date: 10/14/2017  Check In: Session Check In - 10/14/17 0815      Check-In   Location  [facility]-Cardiac & Pulmonary Rehab    Staff Present  Heath Lark, RN, BSN, CCRP;Joseph Hood RCP,RRT,BSRT;Mandi Merrifield, BS, [place];Nada Maclachlan, BA, ACSM CEP, Exercise Physiologist    Supervising physician immediately available to respond to emergencies  See telemetry face sheet for immediately available ER MD    Medication changes reported      No    Fall or balance concerns reported     No    Warm-up and Cool-down  Performed on first and last piece of equipment    Resistance Training Performed  Yes    VAD Patient?  No    PAD/SET Patient?  No      Pain Assessment   Currently in Pain?  No/denies    Multiple Pain Sites  No          Social History   Tobacco Use  Smoking Status Former Smoker  . Packs/day: 1.00  . Years: 50.00  . Pack years: 50.00  . Types: Cigarettes  . Last attempt to quit: 08/2017  . Years since quitting: 0.1  Smokeless Tobacco Never Used  Tobacco Comment   Has Quit as of May 2019- reviewed relaspe and will continue to followup    Goals Met:  Independence with exercise equipment Exercise tolerated well No report of cardiac concerns or symptoms Strength training completed today  Goals Unmet:  Not Applicable  Comments: Pt able to follow exercise prescription today without complaint.  Will continue to monitor for progression.    Dr. Emily Filbert is Medical Director for [place] and [facility].

## 2017-10-16 DIAGNOSIS — I213 ST elevation (STEMI) myocardial infarction of unspecified site: Secondary | ICD-10-CM

## 2017-10-16 DIAGNOSIS — Z955 Presence of coronary angioplasty implant and graft: Secondary | ICD-10-CM

## 2017-10-16 NOTE — Progress Notes (Signed)
Daily Session Note  Patient Details  Name: Miron A Steffy MRN: [id number] Date of Birth: 08/12/1945 Referring Provider:     Cardiac Rehab from 09/11/2017 in [facility] Cardiac and Pulmonary Rehab  Referring Provider  Paraschos      Encounter Date: 10/16/2017  Check In: Session Check In - 10/16/17 0937      Check-In   Location  [facility]-Cardiac & Pulmonary Rehab    Staff Present  Nana Addai, RN BSN;Susanne Bice, RN, BSN, CCRP; , BA, ACSM CEP, Exercise Physiologist    Supervising physician immediately available to respond to emergencies  See telemetry face sheet for immediately available ER MD    Medication changes reported      No    Fall or balance concerns reported     No    Warm-up and Cool-down  Performed on first and last piece of equipment    Resistance Training Performed  Yes    VAD Patient?  No    PAD/SET Patient?  No      Pain Assessment   Currently in Pain?  No/denies    Multiple Pain Sites  No          Social History   Tobacco Use  Smoking Status Former Smoker  . Packs/day: 1.00  . Years: 50.00  . Pack years: 50.00  . Types: Cigarettes  . Last attempt to quit: 08/2017  . Years since quitting: 0.1  Smokeless Tobacco Never Used  Tobacco Comment   Has Quit as of May 2019- reviewed relaspe and will continue to followup    Goals Met:  Independence with exercise equipment Exercise tolerated well No report of cardiac concerns or symptoms Strength training completed today  Goals Unmet:  Not Applicable  Comments: Pt able to follow exercise prescription today without complaint.  Will continue to monitor for progression.    Dr. Mark Miller is Medical Director for [facility] and [facility]. 

## 2017-10-21 DIAGNOSIS — Z955 Presence of coronary angioplasty implant and graft: Secondary | ICD-10-CM | POA: Diagnosis not present

## 2017-10-21 DIAGNOSIS — I213 ST elevation (STEMI) myocardial infarction of unspecified site: Secondary | ICD-10-CM | POA: Diagnosis not present

## 2017-10-21 NOTE — Progress Notes (Signed)
Daily Session Note  Patient Details  Name: Henry [name] MRN: [id number] Date of Birth: Oct 03, 1945 Referring Provider:     Cardiac Rehab from 09/11/2017 in [facility] Cardiac and Pulmonary Rehab  Referring Provider  Paraschos      Encounter Date: 10/21/2017  Check In: Session Check In - 10/21/17 0843      Check-In   Location  [facility]-Cardiac & Pulmonary Rehab    Staff Present  Heath Lark, RN, BSN, CCRP;Jessica Aurora, MA, RCEP, CCRP, Exercise Physiologist;Mandi Ballard, BS, PEC;Nada Maclachlan, BA, ACSM CEP, Exercise Physiologist    Supervising physician immediately available to respond to emergencies  See telemetry face sheet for immediately available ER MD    Medication changes reported      No    Fall or balance concerns reported     No    Warm-up and Cool-down  Performed on first and last piece of equipment    Resistance Training Performed  Yes    VAD Patient?  No    PAD/SET Patient?  No      Pain Assessment   Currently in Pain?  No/denies    Multiple Pain Sites  No          Social History   Tobacco Use  Smoking Status Former Smoker  . Packs/day: 1.00  . Years: 50.00  . Pack years: 50.00  . Types: Cigarettes  . Last attempt to quit: 08/2017  . Years since quitting: 0.2  Smokeless Tobacco Never Used  Tobacco Comment   Has Quit as of May 2019- reviewed relaspe and will continue to followup    Goals Met:  Independence with exercise equipment Exercise tolerated well Personal goals reviewed No report of cardiac concerns or symptoms Strength training completed today  Goals Unmet:  Not Applicable  Comments: Pt able to follow exercise prescription today without complaint.  Will continue to monitor for progression.    Dr. Emily Filbert is Medical Director for [place] and [facility].

## 2017-10-22 ENCOUNTER — Encounter: Payer: Self-pay | Admitting: *Deleted

## 2017-10-22 DIAGNOSIS — I213 ST elevation (STEMI) myocardial infarction of unspecified site: Secondary | ICD-10-CM

## 2017-10-22 DIAGNOSIS — Z955 Presence of coronary angioplasty implant and graft: Secondary | ICD-10-CM

## 2017-10-22 NOTE — Progress Notes (Signed)
Cardiac Individual Treatment Plan  Patient Details  Name: Henry [name] MRN: [id number] Date of Birth: [birth year] Referring Provider:     Cardiac Rehab from 09/11/2017 in [facility] Cardiac and Pulmonary Rehab  Referring Provider  Paraschos      Initial Encounter Date:    Cardiac Rehab from 09/11/2017 in [facility] Cardiac and Pulmonary Rehab  Date  09/11/17      Visit Diagnosis: ST elevation myocardial infarction (STEMI), unspecified artery [facility])  Status post coronary artery stent placement  Patient's Home Medications on Admission:  Current Outpatient Medications:  .  acetaminophen (TYLENOL) 325 MG tablet, Take 2 tablets (650 mg total) by mouth every 6 (six) hours as needed for mild pain (or Fever >/= 101). (Patient not taking: Reported on 08/20/2017), Disp: , Rfl:  .  albuterol (PROVENTIL HFA;VENTOLIN HFA) 108 (90 Base) MCG/ACT inhaler, Inhale 2 puffs into the lungs every 6 (six) hours as needed for wheezing or shortness of breath., Disp: 1 Inhaler, Rfl: 0 .  aspirin EC 81 MG EC tablet, Take 1 tablet (81 mg total) by mouth daily., Disp: , Rfl:  .  atorvastatin (LIPITOR) 80 MG tablet, Take 1 tablet (80 mg total) by mouth daily at 6 PM., Disp: 30 tablet, Rfl: 2 .  cetirizine (ZYRTEC) 10 MG tablet, Take 10 mg by mouth daily., Disp: , Rfl:  .  fluticasone (FLONASE) 50 MCG/ACT nasal spray, Place 1 spray into both nostrils daily., Disp: , Rfl:  .  HYDROcodone-acetaminophen (NORCO/VICODIN) 5-325 MG tablet, Take 1 tablet by mouth every 6 (six) hours as needed. Pt to take twice a day as needed., Disp: , Rfl:  .  insulin detemir (LEVEMIR) 100 UNIT/ML injection, Inject 0.5 mLs (50 Units total) into the skin 2 (two) times daily., Disp: 10 mL, Rfl: 11 .  irbesartan (AVAPRO) 300 MG tablet, TAKE ONE TABLET EVERY DAY, Disp: 30 tablet, Rfl: 2 .  loratadine (CLARITIN) 10 MG tablet, Take 1 tablet (10 mg total) by mouth daily as needed for allergies. (Patient not taking: Reported on 08/20/2017), Disp: 30 tablet,  Rfl: 0 .  metoprolol tartrate (LOPRESSOR) 25 MG tablet, Take 1 tablet (25 mg total) by mouth 2 (two) times daily., Disp: 60 tablet, Rfl: 2 .  montelukast (SINGULAIR) 10 MG tablet, Take 1 tablet by mouth daily., Disp: , Rfl:  .  multivitamin-iron-minerals-folic acid (CENTRUM) chewable tablet, Chew 1 tablet by mouth daily., Disp: , Rfl:  .  nicotine (NICODERM CQ - DOSED IN MG/24 HOURS) 14 mg/24hr patch, Place 1 patch (14 mg total) onto the skin daily. (Patient taking differently: Place 7 mg onto the skin daily. ), Disp: 28 patch, Rfl: 0 .  nitroGLYCERIN (NITROSTAT) 0.4 MG SL tablet, Place 1 tablet (0.4 mg total) under the tongue every 5 (five) minutes as needed for chest pain. (Patient not taking: Reported on 09/23/2017), Disp: 25 tablet, Rfl: 2 .  sitaGLIPtin-metformin (JANUMET) 50-1000 MG tablet, Take 1 tablet by mouth 2 (two) times daily with a meal., Disp: , Rfl:  .  ticagrelor (BRILINTA) 90 MG TABS tablet, Take 1 tablet (90 mg total) by mouth 2 (two) times daily., Disp: 60 tablet, Rfl: 0  Past Medical History: Past Medical History:  Diagnosis Date  . Arthritis   . CAD (coronary artery disease)    a. anterior stemi 08/10/2017; b. LHC 08/10/17: pLAD 70, p-mLAD 99 thrombotic, ostD1 70, p-mLCx 30, pRCA-1 50 calcified, pRCA-2 99 calcified, dRCA 30. Successful PCI/DES to p-mLAD x 2  . Cancer ([place])    skin  .  CKD (chronic kidney disease), stage II   . Glaucoma   . Hyperlipidemia   . Hypertension   . Insulin dependent diabetes mellitus ([place])   . Ischemic cardiomyopathy    a. TTE 08/10/17: technically difficult study, no diagnostic RWMA, EF 40-50%, poor windows, very difficult study   . S/P angioplasty with stent 08/10/17 emergently to LAD ,  08/13/17 atherectomy and stent to pRCA  08/14/2017    Tobacco Use: Social History   Tobacco Use  Smoking Status Former Smoker  . Packs/day: 1.00  . Years: 50.00  . Pack years: 50.00  . Types: Cigarettes  . Last attempt to quit: 08/2017  . Years since quitting:  0.2  Smokeless Tobacco Never Used  Tobacco Comment   Has Quit as of May 2019- reviewed relaspe and will continue to followup    Labs: Recent Review Flowsheet Data    Labs for ITP Cardiac and Pulmonary Rehab Latest Ref Rng & Units 01/25/2016 01/26/2016 01/27/2016 08/10/2017   Cholestrol 0 - 200 mg/dL - - - 132   LDLCALC 0 - 99 mg/dL - - - 65   HDL >40 mg/dL - - - 35(L)   Trlycerides <150 mg/dL - - - 159(H)   Hemoglobin A1c 4.8 - 5.6 % - 6.7(H) 6.9(H) 6.7(H)   HCO3 20.0 - 28.0 mmol/L 30.4(H) - - -       Exercise Target Goals:    Exercise Program Goal: Individual exercise prescription set using results from initial 6 min walk test and THRR while considering  patient's activity barriers and safety.   Exercise Prescription Goal: Initial exercise prescription builds to 30-45 minutes a day of aerobic activity, 2-3 days per week.  Home exercise guidelines will be given to patient during program as part of exercise prescription that the participant will acknowledge.  Activity Barriers & Risk Stratification: Activity Barriers & Cardiac Risk Stratification - 09/11/17 1324      Activity Barriers & Cardiac Risk Stratification   Activity Barriers  Arthritis Feet, knees and hands    Cardiac Risk Stratification  Moderate       6 Minute Walk: 6 Minute Walk    Row Name 09/11/17 1319         6 Minute Walk   Distance  1058 feet     Walk Time  6 minutes     # of Rest Breaks  0     MPH  2     METS  2.15     RPE  11     Perceived Dyspnea   0     VO2 Peak  7.53     Symptoms  Yes (comment)     Comments  knee pain 1/10     Resting HR  69 bpm     Resting BP  104/80     Resting Oxygen Saturation   92 %     Exercise Oxygen Saturation  during 6 min walk  93 %     Max Ex. HR  86 bpm     Max Ex. BP  126/58     2 Minute Post BP  118/68        Oxygen Initial Assessment:   Oxygen Re-Evaluation:   Oxygen Discharge (Final Oxygen Re-Evaluation):   Initial Exercise  Prescription: Initial Exercise Prescription - 09/11/17 1300      Date of Initial Exercise RX and Referring Provider   Date  09/11/17    Referring Provider  Paraschos      Treadmill  MPH  2    Grade  0    Minutes  15    METs  2.15      Recumbant Bike   Level  1    RPM  60    Watts  5    Minutes  15    METs  2      T5 Nustep   Level  1    SPM  80    Minutes  15    METs  2      Prescription Details   Frequency (times per week)  3    Duration  Progress to 30 minutes of continuous aerobic without signs/symptoms of physical distress      Intensity   THRR 40-80% of Max Heartrate  100-132    Ratings of Perceived Exertion  11-13    Perceived Dyspnea  0-4      Resistance Training   Training Prescription  Yes    Weight  4 lb    Reps  10-15       Perform Capillary Blood Glucose checks as needed.  Exercise Prescription Changes: Exercise Prescription Changes    Row Name 09/11/17 1300 09/16/17 1400 09/23/17 0900 10/01/17 1500 10/14/17 1300     Response to Exercise   Blood Pressure (Admit)  104/80  108/68  108/68  110/62  118/58   Blood Pressure (Exercise)  126/58  134/70  134/70  138/60  142/60   Blood Pressure (Exit)  118/68  114/68  114/68  126/64  114/60   Heart Rate (Admit)  65 bpm  71 bpm  71 bpm  66 bpm  70 bpm   Heart Rate (Exercise)  87 bpm  92 bpm  92 bpm  116 bpm  90 bpm   Heart Rate (Exit)  79 bpm  76 bpm  76 bpm  70 bpm  76 bpm   Oxygen Saturation (Admit)  94 %  -  -  -  -   Oxygen Saturation (Exercise)  92 %  -  -  -  -   Oxygen Saturation (Exit)  95 %  -  -  -  -   Rating of Perceived Exertion (Exercise)  -  '15  15  12  12   '$ Symptoms  -  none  none  none  none   Comments  -  first full day of exercise  first full day of exercise  -  -   Duration  -  Continue with 30 min of aerobic exercise without signs/symptoms of physical distress.  Continue with 30 min of aerobic exercise without signs/symptoms of physical distress.  Continue with 30 min of aerobic  exercise without signs/symptoms of physical distress.  Continue with 30 min of aerobic exercise without signs/symptoms of physical distress.   Intensity  -  THRR unchanged  THRR unchanged  THRR unchanged  THRR unchanged     Progression   Progression  -  Continue to progress workloads to maintain intensity without signs/symptoms of physical distress.  Continue to progress workloads to maintain intensity without signs/symptoms of physical distress.  Continue to progress workloads to maintain intensity without signs/symptoms of physical distress.  Continue to progress workloads to maintain intensity without signs/symptoms of physical distress.   Average METs  -  2.33  2.33  2.34  2.3     Resistance Training   Training Prescription  -  Yes  Yes  Yes  Yes   Weight  -  4  lbs  4 lbs  5 lbs  5 lb   Reps  -  10-15  10-15  10-15  10-15     Interval Training   Interval Training  -  No  No  No  No     Treadmill   MPH  -  -  -  1.1  1.1   Grade  -  -  -  1  1   Minutes  -  -  -  15  15   METs  -  -  -  1.99  1.99     Recumbant Bike   Level  -  '1  1  1  '$ -   Watts  -  '18  18  26  '$ -   Minutes  -  '15  15  15  '$ -   METs  -  2.66  2.66  2.83  -     T5 Nustep   Level  -  '1  1  1  2   '$ SPM  -  -  -  -  80   Minutes  -  '15  15  15  15   '$ METs  -  '2  2  2  2     '$ Home Exercise Plan   Plans to continue exercise at  -  -  Home (comment) walk  Home (comment) walk  -   Frequency  -  -  Add 2 additional days to program exercise sessions.  Add 2 additional days to program exercise sessions.  -   Initial Home Exercises Provided  -  -  09/23/17  09/23/17  -      Exercise Comments: Exercise Comments    Row Name 09/16/17 0903           Exercise Comments  First full day of exercise!  Patient was oriented to gym and equipment including functions, settings, policies, and procedures.  Patient's individual exercise prescription and treatment plan were reviewed.  All starting workloads were established based on  the results of the 6 minute walk test done at initial orientation visit.  The plan for exercise progression was also introduced and progression will be customized based on patient's performance and goals.          Exercise Goals and Review: Exercise Goals    Row Name 09/11/17 1316             Exercise Goals   Increase Physical Activity  Yes       Intervention  Provide advice, education, support and counseling about physical activity/exercise needs.;Develop an individualized exercise prescription for aerobic and resistive training based on initial evaluation findings, risk stratification, comorbidities and participant's personal goals.       Expected Outcomes  Short Term: Attend rehab on a regular basis to increase amount of physical activity.;Long Term: Add in home exercise to make exercise part of routine and to increase amount of physical activity.;Long Term: Exercising regularly at least 3-5 days a week.       Increase Strength and Stamina  Yes       Intervention  Provide advice, education, support and counseling about physical activity/exercise needs.;Develop an individualized exercise prescription for aerobic and resistive training based on initial evaluation findings, risk stratification, comorbidities and participant's personal goals.       Expected Outcomes  Short Term: Increase workloads from initial exercise prescription for resistance, speed, and METs.;Short Term: Perform resistance training exercises routinely during rehab  and add in resistance training at home;Long Term: Improve cardiorespiratory fitness, muscular endurance and strength as measured by increased METs and functional capacity (6MWT)       Able to understand and use rate of perceived exertion (RPE) scale  Yes       Intervention  Provide education and explanation on how to use RPE scale       Expected Outcomes  Short Term: Able to use RPE daily in rehab to express subjective intensity level;Long Term:  Able to use RPE to  guide intensity level when exercising independently       Able to understand and use Dyspnea scale  Yes       Intervention  Provide education and explanation on how to use Dyspnea scale       Expected Outcomes  Short Term: Able to use Dyspnea scale daily in rehab to express subjective sense of shortness of breath during exertion;Long Term: Able to use Dyspnea scale to guide intensity level when exercising independently       Knowledge and understanding of Target Heart Rate Range (THRR)  Yes       Intervention  Provide education and explanation of THRR including how the numbers were predicted and where they are located for reference       Expected Outcomes  Short Term: Able to state/look up THRR;Short Term: Able to use daily as guideline for intensity in rehab;Long Term: Able to use THRR to govern intensity when exercising independently       Able to check pulse independently  Yes       Intervention  Provide education and demonstration on how to check pulse in carotid and radial arteries.;Review the importance of being able to check your own pulse for safety during independent exercise       Expected Outcomes  Short Term: Able to explain why pulse checking is important during independent exercise;Long Term: Able to check pulse independently and accurately       Understanding of Exercise Prescription  Yes       Intervention  Provide education, explanation, and written materials on patient's individual exercise prescription       Expected Outcomes  Short Term: Able to explain program exercise prescription;Long Term: Able to explain home exercise prescription to exercise independently          Exercise Goals Re-Evaluation : Exercise Goals Re-Evaluation    Row Name 09/16/17 0903 09/23/17 0939 10/01/17 1511 10/14/17 1304       Exercise Goal Re-Evaluation   Exercise Goals Review  Increase Physical Activity;Able to understand and use rate of perceived exertion (RPE) scale;Knowledge and understanding of  Target Heart Rate Range (THRR);Understanding of Exercise Prescription  Increase Physical Activity;Understanding of Exercise Prescription;Knowledge and understanding of Target Heart Rate Range (THRR);Increase Strength and Stamina;Able to understand and use rate of perceived exertion (RPE) scale;Able to check pulse independently  Increase Physical Activity;Increase Strength and Stamina;Understanding of Exercise Prescription  Increase Physical Activity;Able to understand and use rate of perceived exertion (RPE) scale;Knowledge and understanding of Target Heart Rate Range (THRR);Increase Strength and Stamina    Comments  Reviewed RPE scale, THR and program prescription with pt today.  Pt voiced understanding and was given a copy of goals to take home.   Don walks outside for exercise currently.  He will check his pulse and stretch afterwards. He has arthritis which affects strength training. [facility]  Henry [name] has been doing well in rehab.  Everything continues  to be a 12, but weill be start to increae his workloads.  We will continue to monitor his progression.   Pt is tolerating exercise well.  Still has some foot pain on TM but is able to finish alloted time.    Expected Outcomes  Short: Use RPE daily to regulate intensity.  Long: Follow program prescription in THR.  Short - check pulse while exercising on his own, stretch after walking Long - maintain improved MET level after completing HT  Short: Increase all workloads.  Long: Continue to add in exercise at home.   Short - increase speed and/or grade on TM Long - increase overall MET level       Discharge Exercise Prescription (Final Exercise Prescription Changes): Exercise Prescription Changes - 10/14/17 1300      Response to Exercise   Blood Pressure (Admit)  118/58    Blood Pressure (Exercise)  142/60    Blood Pressure (Exit)  114/60    Heart Rate (Admit)  70 bpm    Heart Rate (Exercise)  90 bpm    Heart Rate (Exit)   76 bpm    Rating of Perceived Exertion (Exercise)  12    Symptoms  none    Duration  Continue with 30 min of aerobic exercise without signs/symptoms of physical distress.    Intensity  THRR unchanged      Progression   Progression  Continue to progress workloads to maintain intensity without signs/symptoms of physical distress.    Average METs  2.3      Resistance Training   Training Prescription  Yes    Weight  5 lb    Reps  10-15      Interval Training   Interval Training  No      Treadmill   MPH  1.1    Grade  1    Minutes  15    METs  1.99      T5 Nustep   Level  2    SPM  80    Minutes  15    METs  2       Nutrition:  Target Goals: Understanding of nutrition guidelines, daily intake of sodium '1500mg'$ , cholesterol '200mg'$ , calories 30% from fat and 7% or less from saturated fats, daily to have 5 or more servings of fruits and vegetables.  Biometrics: Pre Biometrics - 09/11/17 1314      Pre Biometrics   Height  '5\' 11"'$  (1.803 m)    Weight  209 lb 6.4 oz (95 kg)    Waist Circumference  43.5 inches    Hip Circumference  42 inches    Waist to Hip Ratio  1.04 %    BMI (Calculated)  29.22    Single Leg Stand  5.2 seconds        Nutrition Therapy Plan and Nutrition Goals: Nutrition Therapy & Goals - 10/21/17 1055      Nutrition Therapy   Diet  Instructed on a meal plan based on 1800 calories including dietary guidelines for diabetes and heart healthy dietary guidelines    Drug/Food Interactions  Statins/Certain Fruits    Protein (specify units)  8    Fiber  25 grams    Whole Grain Foods  3 servings    Saturated Fats  13 max. grams    Fruits and Vegetables  5 servings/day    Sodium  1500 grams      Personal Nutrition Goals   Nutrition Goal  Include a protein  food at breakfast or mid-morning snack.    Personal Goal #2  Include a small snack in the afternoon such as fruit.    Personal Goal #3  Add as many non-starchy vegetables as possible.    Personal Goal  #4  Read labels for saturated fat and sodium.      Intervention Plan   Intervention  Prescribe, educate and counsel regarding individualized specific dietary modifications aiming towards targeted core components such as weight, hypertension, lipid management, diabetes, heart failure and other comorbidities.;Nutrition handout(s) given to patient.    Expected Outcomes  Short Term Goal: Understand basic principles of dietary content, such as calories, fat, sodium, cholesterol and nutrients.;Short Term Goal: A plan has been developed with personal nutrition goals set during dietitian appointment.;Long Term Goal: Adherence to prescribed nutrition plan.       Nutrition Assessments: Nutrition Assessments - 09/11/17 1327      MEDFICTS Scores   Pre Score  23       Nutrition Goals Re-Evaluation:   Nutrition Goals Discharge (Final Nutrition Goals Re-Evaluation):   Psychosocial: Target Goals: Acknowledge presence or absence of significant depression and/or stress, maximize coping skills, provide positive support system. Participant is able to verbalize types and ability to use techniques and skills needed for reducing stress and depression.   Initial Review & Psychosocial Screening: Initial Psych Review & Screening - 09/11/17 1331      Initial Review   Current issues with  None Identified      Family Dynamics   Good Support System?  Yes friends      Barriers   Psychosocial barriers to participate in program  The patient should benefit from training in stress management and relaxation.;There are no identifiable barriers or psychosocial needs.      Screening Interventions   Interventions  Encouraged to exercise;Provide feedback about the scores to participant    Expected Outcomes  Short Term goal: Utilizing psychosocial counselor, staff and physician to assist with identification of specific Stressors or current issues interfering with healing process. Setting desired goal for each stressor  or current issue identified.;Long Term Goal: Stressors or current issues are controlled or eliminated.;Short Term goal: Identification and review with participant of any Quality of Life or Depression concerns found by scoring the questionnaire.;Long Term goal: The participant improves quality of Life and PHQ9 Scores as seen by post scores and/or verbalization of changes       Quality of Life Scores:  Quality of Life - 09/11/17 1332      Quality of Life Scores   Health/Function Pre  28.4 %    Socioeconomic Pre  30 %    Psych/Spiritual Pre  30 %    Family Pre  30 %    GLOBAL Pre  29.27 %      Scores of 19 and below usually indicate a poorer quality of life in these areas.  A difference of  2-3 points is a clinically meaningful difference.  A difference of 2-3 points in the total score of the Quality of Life Index has been associated with significant improvement in overall quality of life, self-image, physical symptoms, and general health in studies assessing change in quality of life.  PHQ-9: Recent Review Flowsheet Data    Depression screen [facility] 2/9 09/11/2017 08/25/2017   Decreased Interest 0 0   Down, Depressed, Hopeless 0 0   PHQ - 2 Score 0 0   Altered sleeping 0 -   Tired, decreased energy 2  -  Change in appetite 0 -   Feeling bad or failure about yourself  0 -   Trouble concentrating 0 -   Moving slowly or fidgety/restless 0 -   Suicidal thoughts 0 -   PHQ-9 Score 2 -   Difficult doing work/chores Not difficult at all -     Interpretation of Total Score  Total Score Depression Severity:  1-4 = Minimal depression, 5-9 = Mild depression, 10-14 = Moderate depression, 15-19 = Moderately severe depression, 20-27 = Severe depression   Psychosocial Evaluation and Intervention: Psychosocial Evaluation - 09/25/17 0948      Psychosocial Evaluation & Interventions   Interventions  Encouraged to exercise with the program and follow exercise prescription    Comments  Counselor met  with Mr. Mendel Ryder Henry [name]) today for initial psychosocial evaluation.  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  He has a friend that he regards his primary support person and reports no family locally.  Henry [name] has had a chronic history of kidney stones (but none in the past year); some arthritis and diabetes in addition to his heart condition.  He reports sleeping "okay" and having a good appetite.  Henry [name] denies a history of depression or anxiety or any current symptoms and states he is typically in a positive mood most of the time.  Don quit smoking on the day of his heart attack and although he has cravings - he feels empowered to have quit.  His goals are to increase his stamina and strength while in thie program.  Staff will follow.     Expected Outcomes  Short:  Henry [name] will continue to remain nicotene free and exercise for his heart health.  Long:  Henry [name] will exercise consistently to increase his stamina and strength and maintain any progress made in this program.     Continue Psychosocial Services   Follow up required by staff       Psychosocial Re-Evaluation: Psychosocial Re-Evaluation    Silvana Name 10/21/17 [phone]             Psychosocial Re-Evaluation   Current issues with  None Identified       Comments  Henry [name] reports not getting stressed.       Expected Outcomes  Continue to use current strategies to remain stress free.        Interventions  Encouraged to attend Cardiac Rehabilitation for the exercise       Continue Psychosocial Services   Follow up required by staff          Psychosocial Discharge (Final Psychosocial Re-Evaluation): Psychosocial Re-Evaluation - 10/21/17 0814      Psychosocial Re-Evaluation   Current issues with  None Identified    Comments  Henry [name] reports not getting stressed.    Expected Outcomes  Continue to use current strategies to remain stress free.     Interventions  Encouraged to attend Cardiac Rehabilitation for the exercise     Continue Psychosocial Services   Follow up required by staff       Vocational Rehabilitation: Provide vocational rehab assistance to qualifying candidates.   Vocational Rehab Evaluation & Intervention: Vocational Rehab - 09/11/17 1337      Initial Vocational Rehab Evaluation & Intervention   Assessment shows need for Vocational Rehabilitation  No       Education: Education Goals: Education classes will be provided on a variety of topics geared toward better understanding of heart health and risk factor  modification. Participant will state understanding/return demonstration of topics presented as noted by education test scores.  Learning Barriers/Preferences: Learning Barriers/Preferences - 09/11/17 1336      Learning Barriers/Preferences   Learning Barriers  None    Learning Preferences  None       Education Topics:  AED/CPR: - Group verbal and written instruction with the use of models to demonstrate the basic use of the AED with the basic ABC's of resuscitation.   General Nutrition Guidelines/Fats and Fiber: -Group instruction provided by verbal, written material, models and posters to present the general guidelines for heart healthy nutrition. Gives an explanation and review of dietary fats and fiber.   Cardiac Rehab from 10/21/2017 in [facility] Cardiac and Pulmonary Rehab  Date  10/21/17  Educator  CR  Instruction Review Code  1- Verbalizes Understanding      Controlling Sodium/Reading Food Labels: -Group verbal and written material supporting the discussion of sodium use in heart healthy nutrition. Review and explanation with models, verbal and written materials for utilization of the food label.   Exercise Physiology & General Exercise Guidelines: - Group verbal and written instruction with models to review the exercise physiology of the cardiovascular system and associated critical values. Provides general exercise guidelines with specific guidelines to those with  heart or lung disease.    Aerobic Exercise & Resistance Training: - Gives group verbal and written instruction on the various components of exercise. Focuses on aerobic and resistive training programs and the benefits of this training and how to safely progress through these programs..   Cardiac Rehab from 10/21/2017 in [facility] Cardiac and Pulmonary Rehab  Date  09/23/17  Educator  [facility]  Instruction Review Code  1- Verbalizes Understanding      Flexibility, Balance, Mind/Body Relaxation: Provides group verbal/written instruction on the benefits of flexibility and balance training, including mind/body exercise modes such as yoga, pilates and tai chi.  Demonstration and skill practice provided.   Cardiac Rehab from 10/21/2017 in [facility] Cardiac and Pulmonary Rehab  Date  09/25/17  Educator  AS  Instruction Review Code  1- Verbalizes Understanding      Stress and Anxiety: - Provides group verbal and written instruction about the health risks of elevated stress and causes of high stress.  Discuss the correlation between heart/lung disease and anxiety and treatment options. Review healthy ways to manage with stress and anxiety.   Cardiac Rehab from 10/21/2017 in [facility] Cardiac and Pulmonary Rehab  Date  10/14/17  Educator  [facility]  Instruction Review Code  1- Verbalizes Understanding      Depression: - Provides group verbal and written instruction on the correlation between heart/lung disease and depressed mood, treatment options, and the stigmas associated with seeking treatment.   Cardiac Rehab from 10/21/2017 in [facility] Cardiac and Pulmonary Rehab  Date  09/16/17  Educator  [facility]  Instruction Review Code  1- Verbalizes Understanding      Anatomy & Physiology of the Heart: - Group verbal and written instruction and models provide basic cardiac anatomy and physiology, with the coronary electrical and arterial systems. Review of Valvular disease and Heart Failure   Cardiac Rehab from 10/21/2017 in [facility]  Cardiac and Pulmonary Rehab  Date  10/16/17  Educator  SB  Instruction Review Code  1- Verbalizes Understanding      Cardiac Procedures: - Group verbal and written instruction to review commonly prescribed medications for heart disease. Reviews the medication, class of the drug, and side effects. Includes the steps to properly  store meds and maintain the prescription regimen. (beta blockers and nitrates)   Cardiac Rehab from 10/21/2017 in [facility] Cardiac and Pulmonary Rehab  Date  10/07/17  Educator  CE  Instruction Review Code  1- Verbalizes Understanding      Cardiac Medications I: - Group verbal and written instruction to review commonly prescribed medications for heart disease. Reviews the medication, class of the drug, and side effects. Includes the steps to properly store meds and maintain the prescription regimen.   Cardiac Rehab from 10/21/2017 in [facility] Cardiac and Pulmonary Rehab  Date  09/30/17  Educator  SB  Instruction Review Code  1- Verbalizes Understanding      Cardiac Medications II: -Group verbal and written instruction to review commonly prescribed medications for heart disease. Reviews the medication, class of the drug, and side effects. (all other drug classes)   Cardiac Rehab from 10/21/2017 in [facility] Cardiac and Pulmonary Rehab  Date  09/18/17  Educator  CE  Instruction Review Code  1- Verbalizes Understanding       Go Sex-Intimacy & Heart Disease, Get SMART - Goal Setting: - Group verbal and written instruction through game format to discuss heart disease and the return to sexual intimacy. Provides group verbal and written material to discuss and apply goal setting through the application of the S.M.A.R.T. Method.   Cardiac Rehab from 10/21/2017 in [facility] Cardiac and Pulmonary Rehab  Date  10/07/17  Educator  CE  Instruction Review Code  1- Verbalizes Understanding      Other Matters of the Heart: - Provides group verbal, written materials and models to  describe Stable Angina and Peripheral Artery. Includes description of the disease process and treatment options available to the cardiac patient.   Exercise & Equipment Safety: - Individual verbal instruction and demonstration of equipment use and safety with use of the equipment.   Cardiac Rehab from 10/21/2017 in [facility] Cardiac and Pulmonary Rehab  Date  09/11/17  Educator  Sb  Instruction Review Code  1- Verbalizes Understanding      Infection Prevention: - Provides verbal and written material to individual with discussion of infection control including proper hand washing and proper equipment cleaning during exercise session.   Cardiac Rehab from 10/21/2017 in [facility] Cardiac and Pulmonary Rehab  Date  09/11/17  Educator  SB  Instruction Review Code  1- Verbalizes Understanding      Falls Prevention: - Provides verbal and written material to individual with discussion of falls prevention and safety.   Cardiac Rehab from 10/21/2017 in [facility] Cardiac and Pulmonary Rehab  Date  09/11/17  Educator  SB  Instruction Review Code  1- Verbalizes Understanding      Diabetes: - Individual verbal and written instruction to review signs/symptoms of diabetes, desired ranges of glucose level fasting, after meals and with exercise. Acknowledge that pre and post exercise glucose checks will be done for 3 sessions at entry of program.   Cardiac Rehab from 10/21/2017 in [facility] Cardiac and Pulmonary Rehab  Date  09/11/17  Educator  SB  Instruction Review Code  1- Verbalizes Understanding      Know Your Numbers and Risk Factors: -Group verbal and written instruction about important numbers in your health.  Discussion of what are risk factors and how they play a role in the disease process.  Review of Cholesterol, Blood Pressure, Diabetes, and BMI and the role they play in your overall health.   Cardiac Rehab from 10/21/2017 in [facility] Cardiac and Pulmonary Rehab  Date  09/18/17  Educator  CE  Instruction  Review Code  1- Verbalizes Understanding      Sleep Hygiene: -Provides group verbal and written instruction about how sleep can affect your health.  Define sleep hygiene, discuss sleep cycles and impact of sleep habits. Review good sleep hygiene tips.    Other: -Provides group and verbal instruction on various topics (see comments)   Knowledge Questionnaire Score: Knowledge Questionnaire Score - 09/11/17 1336      Knowledge Questionnaire Score   Pre Score  15/26 Correct responses were reviewed with Elenore Rota today. He verbalized understanding of the responses and had no furhter questions       Core Components/Risk Factors/Patient Goals at Admission: Personal Goals and Risk Factors at Admission - 09/11/17 1330      Core Components/Risk Factors/Patient Goals on Admission    Weight Management  Yes;Weight Loss;Obesity    Intervention  Weight Management: Develop a combined nutrition and exercise program designed to reach desired caloric intake, while maintaining appropriate intake of nutrient and fiber, sodium and fats, and appropriate energy expenditure required for the weight goal.;Weight Management/Obesity: Establish reasonable short term and long term weight goals.    Admit Weight  209 lb (94.8 kg)    Goal Weight: Short Term  207 lb (93.9 kg)    Goal Weight: Long Term  205 lb (93 kg)    Expected Outcomes  Short Term: Continue to assess and modify interventions until short term weight is achieved;Long Term: Adherence to nutrition and physical activity/exercise program aimed toward attainment of established weight goal;Weight Loss: Understanding of general recommendations for a balanced deficit meal plan, which promotes 1-2 lb weight loss per week and includes a negative energy balance of [phone] kcal/d    Tobacco Cessation  Yes    Intervention  Assist the participant in steps to quit. Provide individualized education and counseling about committing to Tobacco Cessation, relapse prevention,  and pharmacological support that can be provided by physician.;Advice worker, assist with locating and accessing local/national Quit Smoking programs, and support quit date choice.    Expected Outcomes  Short Term: Will demonstrate readiness to quit, by selecting a quit date.;Short Term: Will quit all tobacco product use, adhering to prevention of relapse plan.;Long Term: Complete abstinence from all tobacco products for at least 12 months from quit date. HAs quit as of May 2019, using nicotine patches to wean off.      Diabetes  Yes    Intervention  Provide education about signs/symptoms and action to take for hypo/hyperglycemia.;Provide education about proper nutrition, including hydration, and aerobic/resistive exercise prescription along with prescribed medications to achieve blood glucose in normal ranges: Fasting glucose 65-99 mg/dL    Expected Outcomes  Short Term: Participant verbalizes understanding of the signs/symptoms and immediate care of hyper/hypoglycemia, proper foot care and importance of medication, aerobic/resistive exercise and nutrition plan for blood glucose control.;Long Term: Attainment of HbA1C < 7%.    Hypertension  Yes    Intervention  Provide education on lifestyle modifcations including regular physical activity/exercise, weight management, moderate sodium restriction and increased consumption of fresh fruit, vegetables, and low fat dairy, alcohol moderation, and smoking cessation.;Monitor prescription use compliance.    Expected Outcomes  Short Term: Continued assessment and intervention until BP is < 140/40m HG in hypertensive participants. < 130/815mHG in hypertensive participants with diabetes, heart failure or chronic kidney disease.;Long Term: Maintenance of blood pressure at goal levels.    Lipids  Yes    Intervention  Provide education  and support for participant on nutrition & aerobic/resistive exercise along with prescribed medications to achieve LDL  '70mg'$ , HDL >'40mg'$ .    Expected Outcomes  Short Term: Participant states understanding of desired cholesterol values and is compliant with medications prescribed. Participant is following exercise prescription and nutrition guidelines.;Long Term: Cholesterol controlled with medications as prescribed, with individualized exercise RX and with personalized nutrition plan. Value goals: LDL < '70mg'$ , HDL > 40 mg.       Core Components/Risk Factors/Patient Goals Review:  Goals and Risk Factor Review    Row Name 10/21/17 0807             Core Components/Risk Factors/Patient Goals Review   Personal Goals Review  Other;Weight Management/Obesity;Hypertension;Lipids       Review  Henry [name] still has trouble with his feet and knees.  he wears good supportive shoes and orthortics which is all that is recommended by his Dr.  He has noticed having more energy and feelng better and feels the benefit to his heart is worth the foot and knee trouble.  He has not been able to walk outside due to hot weather.  He has had some trouble staying asleep since his heart event  and will contact Dr about this issue.  We discussed possibility of water walking as an alternative.         Expected Outcomes  Short - call his Dr about sleeping this week Long - develop a plan to help with sleep long term, continue exercise          Core Components/Risk Factors/Patient Goals at Discharge (Final Review):  Goals and Risk Factor Review - 10/21/17 0807      Core Components/Risk Factors/Patient Goals Review   Personal Goals Review  Other;Weight Management/Obesity;Hypertension;Lipids    Review  Henry [name] still has trouble with his feet and knees.  he wears good supportive shoes and orthortics which is all that is recommended by his Dr.  He has noticed having more energy and feelng better and feels the benefit to his heart is worth the foot and knee trouble.  He has not been able to walk outside due to hot weather.  He has had some trouble staying  asleep since his heart event  and will contact Dr about this issue.  We discussed possibility of water walking as an alternative.      Expected Outcomes  Short - call his Dr about sleeping this week Long - develop a plan to help with sleep long term, continue exercise       ITP Comments: ITP Comments    Row Name 09/11/17 1320 09/24/17 0601 10/22/17 0543       ITP Comments  Medical review completed. ITP sent to Dr Loleta Chance for review, changes as needed and signnature.  Documentation of diagnosis can be found in [facility] 08/12/2017  30 day review. Continue with ITP unless directed changes per Medical Director review  New to program  30 day review. Continue with ITP unless directed changes per Medical Director review.          Comments: 30 day review. Continue with ITP unless directed changes per Medical Director review.

## 2017-10-23 ENCOUNTER — Encounter: Payer: Medicare HMO | Admitting: *Deleted

## 2017-10-23 DIAGNOSIS — I213 ST elevation (STEMI) myocardial infarction of unspecified site: Secondary | ICD-10-CM

## 2017-10-23 DIAGNOSIS — Z955 Presence of coronary angioplasty implant and graft: Secondary | ICD-10-CM | POA: Diagnosis not present

## 2017-10-23 LAB — GLUCOSE, CAPILLARY: GLUCOSE-CAPILLARY: 132 mg/dL — AB (ref 70–99)

## 2017-10-23 NOTE — Progress Notes (Signed)
Daily Session Note  Patient Details  Name: DAVIYON WIDMAYER MRN: [id number] Date of Birth: 10/13/1945 Referring Provider:     Cardiac Rehab from 09/11/2017 in [facility] Cardiac and Pulmonary Rehab  Referring Provider  Paraschos      Encounter Date: 10/23/2017  Check In: Session Check In - 10/23/17 0848      Check-In   Location  [facility]-Cardiac & Pulmonary Rehab    Staff Present  Heath Lark, RN, BSN, CCRP;Jessica Middleton, MA, RCEP, CCRP, Exercise Physiologist;Mandi Zachery Conch, BS, [facility]    Supervising physician immediately available to respond to emergencies  See telemetry face sheet for immediately available ER MD    Medication changes reported      No    Fall or balance concerns reported     No    Tobacco Cessation  No Change    Warm-up and Cool-down  Performed on first and last piece of equipment    Resistance Training Performed  Yes    VAD Patient?  No    PAD/SET Patient?  No      Pain Assessment   Currently in Pain?  No/denies    Pain Score  0-No pain    Multiple Pain Sites  No          Social History   Tobacco Use  Smoking Status Former Smoker  . Packs/day: 1.00  . Years: 50.00  . Pack years: 50.00  . Types: Cigarettes  . Last attempt to quit: 08/2017  . Years since quitting: 0.2  Smokeless Tobacco Never Used  Tobacco Comment   Has Quit as of May 2019- reviewed relaspe and will continue to followup    Goals Met:  Independence with exercise equipment Exercise tolerated well No report of cardiac concerns or symptoms Strength training completed today  Goals Unmet:  Not Applicable  Comments: Pt able to follow exercise prescription today without complaint.  Will continue to monitor for progression.    Dr. Emily Filbert is Medical Director for [place] and [facility].

## 2017-10-28 DIAGNOSIS — Z955 Presence of coronary angioplasty implant and graft: Secondary | ICD-10-CM

## 2017-10-28 DIAGNOSIS — I213 ST elevation (STEMI) myocardial infarction of unspecified site: Secondary | ICD-10-CM | POA: Diagnosis not present

## 2017-10-28 NOTE — Progress Notes (Signed)
Daily Session Note  Patient Details  Name: Henry [name] MRN: [id number] Date of Birth: Sep 14, 1945 Referring Provider:     Cardiac Rehab from 09/11/2017 in [facility] Cardiac and Pulmonary Rehab  Referring Provider  Paraschos      Encounter Date: 10/28/2017  Check In: Session Check In - 10/28/17 4492      Check-In   Location  [facility]-Cardiac & Pulmonary Rehab    Staff Present  Heath Lark, RN, BSN, CCRP;Mandi Loann Chahal, BS, PEC;Nada Maclachlan, BA, ACSM CEP, Exercise Physiologist    Supervising physician immediately available to respond to emergencies  See telemetry face sheet for immediately available ER MD    Medication changes reported      No    Fall or balance concerns reported     No    Tobacco Cessation  No Change    Warm-up and Cool-down  Performed on first and last piece of equipment    Resistance Training Performed  Yes    VAD Patient?  No    PAD/SET Patient?  No      Pain Assessment   Currently in Pain?  No/denies    Pain Score  0-No pain    Multiple Pain Sites  No          Social History   Tobacco Use  Smoking Status Former Smoker  . Packs/day: 1.00  . Years: 50.00  . Pack years: 50.00  . Types: Cigarettes  . Last attempt to quit: 08/2017  . Years since quitting: 0.2  Smokeless Tobacco Never Used  Tobacco Comment   Has Quit as of May 2019- reviewed relaspe and will continue to followup    Goals Met:  Independence with exercise equipment Exercise tolerated well No report of cardiac concerns or symptoms Strength training completed today  Goals Unmet:  Not Applicable  Comments: Pt able to follow exercise prescription today without complaint.  Will continue to monitor for progression.    Dr. Emily Filbert is Medical Director for [place] and [facility].

## 2017-10-30 DIAGNOSIS — I213 ST elevation (STEMI) myocardial infarction of unspecified site: Secondary | ICD-10-CM

## 2017-10-30 DIAGNOSIS — Z955 Presence of coronary angioplasty implant and graft: Secondary | ICD-10-CM

## 2017-10-30 NOTE — Progress Notes (Signed)
Daily Session Note  Patient Details  Name: Henry [name] MRN: [id number] Date of Birth: 07-Jan-1946 Referring Provider:     Cardiac Rehab from 09/11/2017 in [facility] Cardiac and Pulmonary Rehab  Referring Provider  Paraschos      Encounter Date: 10/30/2017  Check In: Session Check In - 10/30/17 0842      Check-In   Location  [facility]-Cardiac & Pulmonary Rehab    Staff Present  Gerlene Burdock, RN, BSN;Jessica Hawkins, MA, RCEP, CCRP, Exercise Physiologist;Mandi Ballard, BS, PEC;Nada Maclachlan, BA, ACSM CEP, Exercise Physiologist    Supervising physician immediately available to respond to emergencies  See telemetry face sheet for immediately available ER MD    Medication changes reported      No    Fall or balance concerns reported     No    Warm-up and Cool-down  Performed on first and last piece of equipment    Resistance Training Performed  Yes    VAD Patient?  No    PAD/SET Patient?  No      Pain Assessment   Currently in Pain?  No/denies    Multiple Pain Sites  No        Exercise Prescription Changes - 10/29/17 0900      Response to Exercise   Blood Pressure (Admit)  102/60    Blood Pressure (Exercise)  140/64    Blood Pressure (Exit)  112/62    Heart Rate (Admit)  70 bpm    Heart Rate (Exercise)  93 bpm    Heart Rate (Exit)  69 bpm    Rating of Perceived Exertion (Exercise)  12    Symptoms  none    Duration  Continue with 30 min of aerobic exercise without signs/symptoms of physical distress.    Intensity  THRR unchanged      Progression   Progression  Continue to progress workloads to maintain intensity without signs/symptoms of physical distress.    Average METs  2.32      Resistance Training   Training Prescription  Yes    Weight  5 lbs    Reps  10-15      Interval Training   Interval Training  No      Treadmill   MPH  1.1    Grade  1    Minutes  15    METs  1.99      Recumbant Bike   Level  2    Watts  22    Minutes  15    METs  2.66      T5  Nustep   Level  2    Minutes  15    METs  2.3      Home Exercise Plan   Plans to continue exercise at  Home (comment) walk    Frequency  Add 2 additional days to program exercise sessions.    Initial Home Exercises Provided  09/23/17       Social History   Tobacco Use  Smoking Status Former Smoker  . Packs/day: 1.00  . Years: 50.00  . Pack years: 50.00  . Types: Cigarettes  . Last attempt to quit: 08/2017  . Years since quitting: 0.2  Smokeless Tobacco Never Used  Tobacco Comment   Has Quit as of May 2019- reviewed relaspe and will continue to followup    Goals Met:  Independence with exercise equipment Exercise tolerated well No report of cardiac concerns or symptoms Strength training completed today  Goals Unmet:  Not Applicable  Comments: Pt able to follow exercise prescription today without complaint.  Will continue to monitor for progression.    Dr. Emily Filbert is Medical Director for [place] and [facility].

## 2017-11-04 VITALS — Ht 71.0 in | Wt 210.0 lb

## 2017-11-04 DIAGNOSIS — Z955 Presence of coronary angioplasty implant and graft: Secondary | ICD-10-CM

## 2017-11-04 DIAGNOSIS — I213 ST elevation (STEMI) myocardial infarction of unspecified site: Secondary | ICD-10-CM

## 2017-11-04 LAB — GLUCOSE, CAPILLARY: GLUCOSE-CAPILLARY: 133 mg/dL — AB (ref 70–99)

## 2017-11-04 NOTE — Progress Notes (Signed)
Daily Session Note  Patient Details  Name: Henry [name] MRN: [id number] Date of Birth: 24-Aug-1945 Referring Provider:     Cardiac Rehab from 09/11/2017 in [facility] Cardiac and Pulmonary Rehab  Referring Provider  Paraschos      Encounter Date: 11/04/2017  Check In: Session Check In - 11/04/17 0854      Check-In   Supervising physician immediately available to respond to emergencies  See telemetry face sheet for immediately available ER MD    Location  [facility]-Cardiac & Pulmonary Rehab    Staff Present  Heath Lark, RN, BSN, CCRP;Jessica Milan, MA, RCEP, CCRP, Exercise Physiologist;Amanda Oletta Darter, [place], ACSM CEP, Exercise Physiologist    Medication changes reported      No    Fall or balance concerns reported     No    Warm-up and Cool-down  Performed on first and last piece of equipment    Resistance Training Performed  Yes    VAD Patient?  No    PAD/SET Patient?  No      Pain Assessment   Currently in Pain?  No/denies    Multiple Pain Sites  No          Social History   Tobacco Use  Smoking Status Former Smoker  . Packs/day: 1.00  . Years: 50.00  . Pack years: 50.00  . Types: Cigarettes  . Last attempt to quit: 08/2017  . Years since quitting: 0.2  Smokeless Tobacco Never Used  Tobacco Comment   Has Quit as of May 2019- reviewed relaspe and will continue to followup    Goals Met:  Independence with exercise equipment Exercise tolerated well No report of cardiac concerns or symptoms Strength training completed today  Goals Unmet:  Not Applicable  Comments:  [place] Name 09/11/17 1319 11/04/17 0855       6 Minute Walk   Phase  -  Discharge    Distance  1058 feet  1325 feet    Distance % Change  -  25 %    Distance Feet Change  -  267 ft    Walk Time  6 minutes  6 minutes    # of Rest Breaks  0  0    MPH  2  -    METS  2.15  2.65    RPE  11  11    Perceived Dyspnea   0  0    VO2 Peak  7.53  9.3    Symptoms  Yes (comment)  No    Comments  knee pain 1/10  -    Resting HR  69 bpm  70 bpm    Resting BP  104/80  128/70    Resting Oxygen Saturation   92 %  -    Exercise Oxygen Saturation  during 6 min walk  93 %  -    Max Ex. HR  86 bpm  91 bpm    Max Ex. BP  126/58  154/60    2 Minute Post BP  118/68  -         Dr. Emily Filbert is Medical Director for [place] and [facility].

## 2017-11-06 ENCOUNTER — Encounter: Payer: Medicare HMO | Attending: Cardiology | Admitting: *Deleted

## 2017-11-06 DIAGNOSIS — I213 ST elevation (STEMI) myocardial infarction of unspecified site: Secondary | ICD-10-CM | POA: Insufficient documentation

## 2017-11-06 DIAGNOSIS — Z955 Presence of coronary angioplasty implant and graft: Secondary | ICD-10-CM | POA: Insufficient documentation

## 2017-11-06 NOTE — Progress Notes (Signed)
Daily Session Note  Patient Details  Name: Henry [name] MRN: [id number] Date of Birth: 1946-01-31 Referring Provider:     Cardiac Rehab from 09/11/2017 in [facility] Cardiac and Pulmonary Rehab  Referring Provider  Paraschos      Encounter Date: 11/06/2017  Check In: Session Check In - 11/06/17 0920      Check-In   Supervising physician immediately available to respond to emergencies  See telemetry face sheet for immediately available ER MD    Staff Present  Alberteen Sam, MA, RCEP, CCRP, Exercise Physiologist;Carroll Enterkin, RN, Vickki Hearing, BA, ACSM CEP, Exercise Physiologist    Medication changes reported      No    Fall or balance concerns reported     No    Warm-up and Cool-down  Performed on first and last piece of equipment    Resistance Training Performed  Yes    VAD Patient?  No    PAD/SET Patient?  No      Pain Assessment   Currently in Pain?  No/denies          Social History   Tobacco Use  Smoking Status Former Smoker  . Packs/day: 1.00  . Years: 50.00  . Pack years: 50.00  . Types: Cigarettes  . Last attempt to quit: 08/2017  . Years since quitting: 0.2  Smokeless Tobacco Never Used  Tobacco Comment   Has Quit as of May 2019- reviewed relaspe and will continue to followup    Goals Met:  Independence with exercise equipment Exercise tolerated well No report of cardiac concerns or symptoms Strength training completed today  Goals Unmet:  Not Applicable  Comments: Pt able to follow exercise prescription today without complaint.  Will continue to monitor for progression.    Dr. Emily Filbert is Medical Director for [facility] and [facility].

## 2017-11-10 DIAGNOSIS — I10 Essential (primary) hypertension: Secondary | ICD-10-CM | POA: Diagnosis not present

## 2017-11-10 DIAGNOSIS — Z794 Long term (current) use of insulin: Secondary | ICD-10-CM | POA: Diagnosis not present

## 2017-11-10 DIAGNOSIS — R002 Palpitations: Secondary | ICD-10-CM | POA: Diagnosis not present

## 2017-11-10 DIAGNOSIS — I491 Atrial premature depolarization: Secondary | ICD-10-CM | POA: Diagnosis not present

## 2017-11-10 DIAGNOSIS — I2510 Atherosclerotic heart disease of native coronary artery without angina pectoris: Secondary | ICD-10-CM | POA: Diagnosis not present

## 2017-11-10 DIAGNOSIS — J449 Chronic obstructive pulmonary disease, unspecified: Secondary | ICD-10-CM | POA: Diagnosis not present

## 2017-11-10 DIAGNOSIS — E1129 Type 2 diabetes mellitus with other diabetic kidney complication: Secondary | ICD-10-CM | POA: Diagnosis not present

## 2017-11-10 DIAGNOSIS — E785 Hyperlipidemia, unspecified: Secondary | ICD-10-CM | POA: Diagnosis not present

## 2017-11-11 ENCOUNTER — Encounter: Payer: Medicare HMO | Admitting: *Deleted

## 2017-11-11 DIAGNOSIS — Z955 Presence of coronary angioplasty implant and graft: Secondary | ICD-10-CM

## 2017-11-11 DIAGNOSIS — I213 ST elevation (STEMI) myocardial infarction of unspecified site: Secondary | ICD-10-CM | POA: Diagnosis not present

## 2017-11-11 NOTE — Progress Notes (Signed)
Daily Session Note  Patient Details  Name: Henry [name] MRN: [id number] Date of Birth: 09/16/1945 Referring Provider:     Cardiac Rehab from 09/11/2017 in [facility] Cardiac and Pulmonary Rehab  Referring Provider  Paraschos      Encounter Date: 11/11/2017  Check In: Session Check In - 11/11/17 0845      Check-In   Supervising physician immediately available to respond to emergencies  See telemetry face sheet for immediately available ER MD    Location  [facility]-Cardiac & Pulmonary Rehab    Staff Present  Mandi Ballard, BS, PEC;Susanne Bice, RN, BSN, CCRP; , MA, RCEP, CCRP, Exercise Physiologist;Amanda Sommer, BA, ACSM CEP, Exercise Physiologist    Medication changes reported      No    Fall or balance concerns reported     No    Tobacco Cessation  No Change    Warm-up and Cool-down  Performed on first and last piece of equipment    Resistance Training Performed  Yes    VAD Patient?  No    PAD/SET Patient?  No      Pain Assessment   Currently in Pain?  No/denies    Pain Score  0-No pain    Multiple Pain Sites  No          Social History   Tobacco Use  Smoking Status Former Smoker  . Packs/day: 1.00  . Years: 50.00  . Pack years: 50.00  . Types: Cigarettes  . Last attempt to quit: 08/2017  . Years since quitting: 0.2  Smokeless Tobacco Never Used  Tobacco Comment   Has Quit as of May 2019- reviewed relaspe and will continue to followup    Goals Met:  Independence with exercise equipment Exercise tolerated well No report of cardiac concerns or symptoms Strength training completed today  Goals Unmet:  Not Applicable  Comments: Pt able to follow exercise prescription today without complaint.  Will continue to monitor for progression.    Dr. Mark Miller is Medical Director for [facility] and [facility]. 

## 2017-11-13 ENCOUNTER — Encounter: Payer: Medicare HMO | Admitting: *Deleted

## 2017-11-13 DIAGNOSIS — I213 ST elevation (STEMI) myocardial infarction of unspecified site: Secondary | ICD-10-CM | POA: Diagnosis not present

## 2017-11-13 DIAGNOSIS — Z955 Presence of coronary angioplasty implant and graft: Secondary | ICD-10-CM | POA: Diagnosis not present

## 2017-11-13 NOTE — Progress Notes (Signed)
Daily Session Note  Patient Details  Name: Henry [name] MRN: [id number] Date of Birth: 1946-02-05 Referring Provider:     Cardiac Rehab from 09/11/2017 in [facility] Cardiac and Pulmonary Rehab  Referring Provider  Paraschos      Encounter Date: 11/13/2017  Check In: Session Check In - 11/13/17 [place]      Check-In   Supervising physician immediately available to respond to emergencies  See telemetry face sheet for immediately available ER MD    Location  [facility]-Cardiac & Pulmonary Rehab    Staff Present  Renita Papa, RN BSN;Morgana Rowley Luan Pulling, MA, RCEP, CCRP, Exercise Physiologist;Amanda Oletta Darter, BA, ACSM CEP, Exercise Physiologist;Mandi Ballard, BS, PEC    Medication changes reported      No    Fall or balance concerns reported     No    Warm-up and Cool-down  Performed on first and last piece of equipment    Resistance Training Performed  Yes    VAD Patient?  No    PAD/SET Patient?  No      Pain Assessment   Currently in Pain?  No/denies          Social History   Tobacco Use  Smoking Status Former Smoker  . Packs/day: 1.00  . Years: 50.00  . Pack years: 50.00  . Types: Cigarettes  . Last attempt to quit: 08/2017  . Years since quitting: 0.2  Smokeless Tobacco Never Used  Tobacco Comment   Has Quit as of May 2019- reviewed relaspe and will continue to followup    Goals Met:  Independence with exercise equipment Exercise tolerated well No report of cardiac concerns or symptoms Strength training completed today  Goals Unmet:  Not Applicable  Comments: Pt able to follow exercise prescription today without complaint.  Will continue to monitor for progression.    Dr. Emily Filbert is Medical Director for [facility].

## 2017-11-13 NOTE — Patient Instructions (Addendum)
Discharge Patient Instructions  Patient Details  Name: Henry [name] MRN: [id number] Date of Birth: 13-Oct-1945 Referring Provider:  Isaias Cowman, MD   Number of Visits: 83  Reason for Discharge:  Patient reached a stable level of exercise. Patient independent in their exercise. Patient has met program and personal goals.  Smoking History:  Social History   Tobacco Use  Smoking Status Former Smoker  . Packs/day: 1.00  . Years: 50.00  . Pack years: 50.00  . Types: Cigarettes  . Last attempt to quit: 08/2017  . Years since quitting: 0.2  Smokeless Tobacco Never Used  Tobacco Comment   Has Quit as of May 2019- reviewed relaspe and will continue to followup    Diagnosis:  ST elevation myocardial infarction (STEMI), unspecified artery ([place])  Status post coronary artery stent placement  Initial Exercise Prescription: Initial Exercise Prescription - 09/11/17 1300      Date of Initial Exercise RX and Referring Provider   Date  09/11/17    Referring Provider  Paraschos      Treadmill   MPH  2    Grade  0    Minutes  15    METs  2.15      Recumbant Bike   Level  1    RPM  60    Watts  5    Minutes  15    METs  2      T5 Nustep   Level  1    SPM  80    Minutes  15    METs  2      Prescription Details   Frequency (times per week)  3    Duration  Progress to 30 minutes of continuous aerobic without signs/symptoms of physical distress      Intensity   THRR 40-80% of Max Heartrate  100-132    Ratings of Perceived Exertion  11-13    Perceived Dyspnea  0-4      Resistance Training   Training Prescription  Yes    Weight  4 lb    Reps  10-15       Discharge Exercise Prescription (Final Exercise Prescription Changes): Exercise Prescription Changes - 11/11/17 1500      Response to Exercise   Blood Pressure (Admit)  104/58    Blood Pressure (Exercise)  142/60    Blood Pressure (Exit)  126/70    Heart Rate (Admit)  67 bpm    Heart Rate  (Exercise)  83 bpm    Heart Rate (Exit)  69 bpm    Rating of Perceived Exertion (Exercise)  13    Symptoms  none    Duration  Continue with 30 min of aerobic exercise without signs/symptoms of physical distress.    Intensity  THRR unchanged      Progression   Progression  Continue to progress workloads to maintain intensity without signs/symptoms of physical distress.    Average METs  2.6      Resistance Training   Training Prescription  Yes    Weight  5 lbs    Reps  10-15      Interval Training   Interval Training  No      Recumbant Bike   Level  4    Watts  29    Minutes  15    METs  2.99      T5 Nustep   Level  4    Minutes  15    METs  2.2  Home Exercise Plan   Plans to continue exercise at  Home (comment)   walk   Frequency  Add 2 additional days to program exercise sessions.    Initial Home Exercises Provided  09/23/17       Functional Capacity: 6 Minute Walk    Row Name 09/11/17 1319 11/04/17 0855       6 Minute Walk   Phase  -  Discharge    Distance  1058 feet  1325 feet    Distance % Change  -  25 %    Distance Feet Change  -  267 ft    Walk Time  6 minutes  6 minutes    # of Rest Breaks  0  0    MPH  2  2.5    METS  2.15  2.65    RPE  11  11    Perceived Dyspnea   0  0    VO2 Peak  7.53  9.3    Symptoms  Yes (comment)  No    Comments  knee pain 1/10  knee pain 6/10    Resting HR  69 bpm  70 bpm    Resting BP  104/80  128/70    Resting Oxygen Saturation   92 %  -    Exercise Oxygen Saturation  during 6 min walk  93 %  -    Max Ex. HR  86 bpm  91 bpm    Max Ex. BP  126/58  154/60    2 Minute Post BP  118/68  -       Quality of Life: Quality of Life - 11/13/17 0945      Quality of Life   Select  Quality of Life      Quality of Life Scores   Health/Function Pre  28.4 %    Health/Function Post  25.38 %    Health/Function % Change  -10.63 %    Socioeconomic Pre  30 %    Socioeconomic Post  26.75 %    Socioeconomic % Change    -10.83 %    Psych/Spiritual Pre  30 %    Psych/Spiritual Post  30 %    Psych/Spiritual % Change  0 %    Family Pre  30 %    Family Post  27 %    Family % Change  -10 %    GLOBAL Pre  29.27 %    GLOBAL Post  26.95 %    GLOBAL % Change  -7.93 %       Personal Goals: Goals established at orientation with interventions provided to work toward goal. Personal Goals and Risk Factors at Admission - 09/11/17 1330      Core Components/Risk Factors/Patient Goals on Admission    Weight Management  Yes;Weight Loss;Obesity    Intervention  Weight Management: Develop a combined nutrition and exercise program designed to reach desired caloric intake, while maintaining appropriate intake of nutrient and fiber, sodium and fats, and appropriate energy expenditure required for the weight goal.;Weight Management/Obesity: Establish reasonable short term and long term weight goals.    Admit Weight  209 lb (94.8 kg)    Goal Weight: Short Term  207 lb (93.9 kg)    Goal Weight: Long Term  205 lb (93 kg)    Expected Outcomes  Short Term: Continue to assess and modify interventions until short term weight is achieved;Long Term: Adherence to nutrition and physical activity/exercise program aimed toward attainment  of established weight goal;Weight Loss: Understanding of general recommendations for a balanced deficit meal plan, which promotes 1-2 lb weight loss per week and includes a negative energy balance of [phone] kcal/d    Tobacco Cessation  Yes    Intervention  Assist the participant in steps to quit. Provide individualized education and counseling about committing to Tobacco Cessation, relapse prevention, and pharmacological support that can be provided by physician.;Advice worker, assist with locating and accessing local/national Quit Smoking programs, and support quit date choice.    Expected Outcomes  Short Term: Will demonstrate readiness to quit, by selecting a quit date.;Short Term: Will  quit all tobacco product use, adhering to prevention of relapse plan.;Long Term: Complete abstinence from all tobacco products for at least 12 months from quit date.   HAs quit as of May 2019, using nicotine patches to wean off.     Diabetes  Yes    Intervention  Provide education about signs/symptoms and action to take for hypo/hyperglycemia.;Provide education about proper nutrition, including hydration, and aerobic/resistive exercise prescription along with prescribed medications to achieve blood glucose in normal ranges: Fasting glucose 65-99 mg/dL    Expected Outcomes  Short Term: Participant verbalizes understanding of the signs/symptoms and immediate care of hyper/hypoglycemia, proper foot care and importance of medication, aerobic/resistive exercise and nutrition plan for blood glucose control.;Long Term: Attainment of HbA1C < 7%.    Hypertension  Yes    Intervention  Provide education on lifestyle modifcations including regular physical activity/exercise, weight management, moderate sodium restriction and increased consumption of fresh fruit, vegetables, and low fat dairy, alcohol moderation, and smoking cessation.;Monitor prescription use compliance.    Expected Outcomes  Short Term: Continued assessment and intervention until BP is < 140/67m HG in hypertensive participants. < 130/824mHG in hypertensive participants with diabetes, heart failure or chronic kidney disease.;Long Term: Maintenance of blood pressure at goal levels.    Lipids  Yes    Intervention  Provide education and support for participant on nutrition & aerobic/resistive exercise along with prescribed medications to achieve LDL <7036mHDL >75m54m  Expected Outcomes  Short Term: Participant states understanding of desired cholesterol values and is compliant with medications prescribed. Participant is following exercise prescription and nutrition guidelines.;Long Term: Cholesterol controlled with medications as prescribed, with  individualized exercise RX and with personalized nutrition plan. Value goals: LDL < 70mg33mL > 40 mg.        Personal Goals Discharge: Goals and Risk Factor Review - 11/06/17 0823      Core Components/Risk Factors/Patient Goals Review   Personal Goals Review  Weight Management/Obesity;Hypertension;Lipids    Review  Don hTimmothy Soursgotten new inserts.  He has been holding steady around 208-210.  He is still aiming for 200 lbs and continues to work on his diet and exercise.  He continues to do well on medications.  He still checks his blood pressures and blood sugars daily.     Expected Outcomes  Short: Continue to work on his weight loss.  Long: Continue to focus on his risk factors.        Exercise Goals and Review: Exercise Goals    Row Name 09/11/17 1316             Exercise Goals   Increase Physical Activity  Yes       Intervention  Provide advice, education, support and counseling about physical activity/exercise needs.;Develop an individualized exercise prescription for aerobic and resistive training based on initial evaluation findings, risk stratification,  comorbidities and participant's personal goals.       Expected Outcomes  Short Term: Attend rehab on a regular basis to increase amount of physical activity.;Long Term: Add in home exercise to make exercise part of routine and to increase amount of physical activity.;Long Term: Exercising regularly at least 3-5 days a week.       Increase Strength and Stamina  Yes       Intervention  Provide advice, education, support and counseling about physical activity/exercise needs.;Develop an individualized exercise prescription for aerobic and resistive training based on initial evaluation findings, risk stratification, comorbidities and participant's personal goals.       Expected Outcomes  Short Term: Increase workloads from initial exercise prescription for resistance, speed, and METs.;Short Term: Perform resistance training exercises  routinely during rehab and add in resistance training at home;Long Term: Improve cardiorespiratory fitness, muscular endurance and strength as measured by increased METs and functional capacity (6MWT)       Able to understand and use rate of perceived exertion (RPE) scale  Yes       Intervention  Provide education and explanation on how to use RPE scale       Expected Outcomes  Short Term: Able to use RPE daily in rehab to express subjective intensity level;Long Term:  Able to use RPE to guide intensity level when exercising independently       Able to understand and use Dyspnea scale  Yes       Intervention  Provide education and explanation on how to use Dyspnea scale       Expected Outcomes  Short Term: Able to use Dyspnea scale daily in rehab to express subjective sense of shortness of breath during exertion;Long Term: Able to use Dyspnea scale to guide intensity level when exercising independently       Knowledge and understanding of Target Heart Rate Range (THRR)  Yes       Intervention  Provide education and explanation of THRR including how the numbers were predicted and where they are located for reference       Expected Outcomes  Short Term: Able to state/look up THRR;Short Term: Able to use daily as guideline for intensity in rehab;Long Term: Able to use THRR to govern intensity when exercising independently       Able to check pulse independently  Yes       Intervention  Provide education and demonstration on how to check pulse in carotid and radial arteries.;Review the importance of being able to check your own pulse for safety during independent exercise       Expected Outcomes  Short Term: Able to explain why pulse checking is important during independent exercise;Long Term: Able to check pulse independently and accurately       Understanding of Exercise Prescription  Yes       Intervention  Provide education, explanation, and written materials on patient's individual exercise  prescription       Expected Outcomes  Short Term: Able to explain program exercise prescription;Long Term: Able to explain home exercise prescription to exercise independently          Nutrition & Weight - Outcomes: Pre Biometrics - 09/11/17 1314      Pre Biometrics   Height  '5\' 11"'  (1.803 m)    Weight  209 lb 6.4 oz (95 kg)    Waist Circumference  43.5 inches    Hip Circumference  42 inches    Waist to Hip Ratio  1.04 %  BMI (Calculated)  29.22    Single Leg Stand  5.2 seconds      Post Biometrics - 11/04/17 0855       Post  Biometrics   Height  '5\' 11"'  (1.803 m)    Weight  210 lb (95.3 kg)    Waist Circumference  43.5 inches    Hip Circumference  41 inches    Waist to Hip Ratio  1.06 %    BMI (Calculated)  29.3    Single Leg Stand  12.21 seconds       Nutrition: Nutrition Therapy & Goals - 10/21/17 1055      Nutrition Therapy   Diet  Instructed on a meal plan based on 1800 calories including dietary guidelines for diabetes and heart healthy dietary guidelines    Drug/Food Interactions  Statins/Certain Fruits    Protein (specify units)  8    Fiber  25 grams    Whole Grain Foods  3 servings    Saturated Fats  13 max. grams    Fruits and Vegetables  5 servings/day    Sodium  1500 grams      Personal Nutrition Goals   Nutrition Goal  Include a protein food at breakfast or mid-morning snack.    Personal Goal #2  Include a small snack in the afternoon such as fruit.    Personal Goal #3  Add as many non-starchy vegetables as possible.    Personal Goal #4  Read labels for saturated fat and sodium.      Intervention Plan   Intervention  Prescribe, educate and counsel regarding individualized specific dietary modifications aiming towards targeted core components such as weight, hypertension, lipid management, diabetes, heart failure and other comorbidities.;Nutrition handout(s) given to patient.    Expected Outcomes  Short Term Goal: Understand basic principles of dietary  content, such as calories, fat, sodium, cholesterol and nutrients.;Short Term Goal: A plan has been developed with personal nutrition goals set during dietitian appointment.;Long Term Goal: Adherence to prescribed nutrition plan.       Nutrition Discharge: Nutrition Assessments - 11/13/17 0945      MEDFICTS Scores   Pre Score  23    Post Score  42    Score Difference  19       Education Questionnaire Score: Knowledge Questionnaire Score - 11/13/17 0944      Knowledge Questionnaire Score   Pre Score  15/26    Post Score  19/26   test reviewed with pt today      Goals reviewed with patient; copy given to patient.

## 2017-11-17 DIAGNOSIS — J309 Allergic rhinitis, unspecified: Secondary | ICD-10-CM | POA: Diagnosis not present

## 2017-11-17 DIAGNOSIS — I10 Essential (primary) hypertension: Secondary | ICD-10-CM | POA: Diagnosis not present

## 2017-11-17 DIAGNOSIS — I2510 Atherosclerotic heart disease of native coronary artery without angina pectoris: Secondary | ICD-10-CM | POA: Diagnosis not present

## 2017-11-17 DIAGNOSIS — E1129 Type 2 diabetes mellitus with other diabetic kidney complication: Secondary | ICD-10-CM | POA: Diagnosis not present

## 2017-11-17 DIAGNOSIS — E785 Hyperlipidemia, unspecified: Secondary | ICD-10-CM | POA: Diagnosis not present

## 2017-11-17 DIAGNOSIS — Z794 Long term (current) use of insulin: Secondary | ICD-10-CM | POA: Diagnosis not present

## 2017-11-18 ENCOUNTER — Encounter: Payer: Medicare HMO | Admitting: *Deleted

## 2017-11-18 DIAGNOSIS — I213 ST elevation (STEMI) myocardial infarction of unspecified site: Secondary | ICD-10-CM | POA: Diagnosis not present

## 2017-11-18 DIAGNOSIS — Z955 Presence of coronary angioplasty implant and graft: Secondary | ICD-10-CM

## 2017-11-18 NOTE — Progress Notes (Signed)
Discharge Progress Report  Patient Details  Name: Henry [name] MRN: [id number] Date of Birth: 08/21/1945 Referring Provider:     Cardiac Rehab from 09/11/2017 in [facility]  Referring Provider  Paraschos       Number of Visits: 42  Reason for Discharge:  Patient reached a stable level of exercise. Patient independent in their exercise. Patient has met program and personal goals.  Smoking History:  Social History   Tobacco Use  Smoking Status Former Smoker  . Packs/day: 1.00  . Years: 50.00  . Pack years: 50.00  . Types: Cigarettes  . Last attempt to quit: 08/2017  . Years since quitting: 0.2  Smokeless Tobacco Never Used  Tobacco Comment   Has Quit as of May 2019- reviewed relaspe and will continue to followup    Diagnosis:  ST elevation myocardial infarction (STEMI), unspecified artery ([place])  Status post coronary artery stent placement  ADL [facility]:   Initial Exercise Prescription: Initial Exercise Prescription - 09/11/17 1300      Date of Initial Exercise RX and Referring Provider   Date  09/11/17    Referring Provider  Paraschos      Treadmill   MPH  2    Grade  0    Minutes  15    METs  2.15      Recumbant Bike   Level  1    RPM  60    Watts  5    Minutes  15    METs  2      T5 Nustep   Level  1    SPM  80    Minutes  15    METs  2      Prescription Details   Frequency (times per week)  3    Duration  Progress to 30 minutes of continuous aerobic without signs/symptoms of physical distress      Intensity   THRR 40-80% of Max Heartrate  100-132    Ratings of Perceived Exertion  11-13    Perceived Dyspnea  0-4      Resistance Training   Training Prescription  Yes    Weight  4 lb    Reps  10-15       Discharge Exercise Prescription (Final Exercise Prescription Changes): Exercise Prescription Changes - 11/11/17 1500      Response to Exercise   Blood Pressure (Admit)  104/58    Blood Pressure (Exercise)   142/60    Blood Pressure (Exit)  126/70    Heart Rate (Admit)  67 bpm    Heart Rate (Exercise)  83 bpm    Heart Rate (Exit)  69 bpm    Rating of Perceived Exertion (Exercise)  13    Symptoms  none    Duration  Continue with 30 min of aerobic exercise without signs/symptoms of physical distress.    Intensity  THRR unchanged      Progression   Progression  Continue to progress workloads to maintain intensity without signs/symptoms of physical distress.    Average METs  2.6      Resistance Training   Training Prescription  Yes    Weight  5 lbs    Reps  10-15      Interval Training   Interval Training  No      Recumbant Bike   Level  4    Watts  29    Minutes  15    METs  2.99  T5 Nustep   Level  4    Minutes  15    METs  2.2      Home Exercise Plan   Plans to continue exercise at  Home (comment)   walk   Frequency  Add 2 additional days to program exercise sessions.    Initial Home Exercises Provided  09/23/17       Functional Capacity: 6 Minute Walk    Row Name 09/11/17 1319 11/04/17 0855       6 Minute Walk   Phase  -  Discharge    Distance  1058 feet  1325 feet    Distance % Change  -  25 %    Distance Feet Change  -  267 ft    Walk Time  6 minutes  6 minutes    # of Rest Breaks  0  0    MPH  2  2.5    METS  2.15  2.65    RPE  11  11    Perceived Dyspnea   0  0    VO2 Peak  7.53  9.3    Symptoms  Yes (comment)  No    Comments  knee pain 1/10  knee pain 6/10    Resting HR  69 bpm  70 bpm    Resting BP  104/80  128/70    Resting Oxygen Saturation   92 %  -    Exercise Oxygen Saturation  during 6 min walk  93 %  -    Max Ex. HR  86 bpm  91 bpm    Max Ex. BP  126/58  154/60    2 Minute Post BP  118/68  -       Psychological, QOL, Others - Outcomes: PHQ 2/9: Depression screen [facility] 2/9 11/13/2017 09/11/2017 08/25/2017  Decreased Interest 1 0 0  Down, Depressed, Hopeless 0 0 0  PHQ - 2 Score 1 0 0  Altered sleeping 2 0 -  Tired, decreased energy 1 2  -  Change in appetite 0 0 -  Feeling bad or failure about yourself  0 0 -  Trouble concentrating 0 0 -  Moving slowly or fidgety/restless 0 0 -  Suicidal thoughts 0 0 -  PHQ-9 Score 4 2 -  Difficult doing work/chores Not difficult at all Not difficult at all -    Quality of Life: Quality of Life - 11/13/17 0945      Quality of Life   Select  Quality of Life      Quality of Life Scores   Health/Function Pre  28.4 %    Health/Function Post  25.38 %    Health/Function % Change  -10.63 %    Socioeconomic Pre  30 %    Socioeconomic Post  26.75 %    Socioeconomic % Change   -10.83 %    Psych/Spiritual Pre  30 %    Psych/Spiritual Post  30 %    Psych/Spiritual % Change  0 %    Family Pre  30 %    Family Post  27 %    Family % Change  -10 %    GLOBAL Pre  29.27 %    GLOBAL Post  26.95 %    GLOBAL % Change  -7.93 %       Personal Goals: Goals established at orientation with interventions provided to work toward goal. Personal Goals and Risk Factors at Admission - 09/11/17 1330  Core Components/Risk Factors/Patient Goals on Admission    Weight Management  Yes;Weight Loss;Obesity    Intervention  Weight Management: Develop a combined nutrition and exercise program designed to reach desired caloric intake, while maintaining appropriate intake of nutrient and fiber, sodium and fats, and appropriate energy expenditure required for the weight goal.;Weight Management/Obesity: Establish reasonable short term and long term weight goals.    Admit Weight  209 lb (94.8 kg)    Goal Weight: Short Term  207 lb (93.9 kg)    Goal Weight: Long Term  205 lb (93 kg)    Expected Outcomes  Short Term: Continue to assess and modify interventions until short term weight is achieved;Long Term: Adherence to nutrition and physical activity/exercise program aimed toward attainment of established weight goal;Weight Loss: Understanding of general recommendations for a balanced deficit meal plan, which  promotes 1-2 lb weight loss per week and includes a negative energy balance of [phone] kcal/d    Tobacco Cessation  Yes    Intervention  Assist the participant in steps to quit. Provide individualized education and counseling about committing to Tobacco Cessation, relapse prevention, and pharmacological support that can be provided by physician.;Advice worker, assist with locating and accessing local/national Quit Smoking programs, and support quit date choice.    Expected Outcomes  Short Term: Will demonstrate readiness to quit, by selecting a quit date.;Short Term: Will quit all tobacco product use, adhering to prevention of relapse plan.;Long Term: Complete abstinence from all tobacco products for at least 12 months from quit date.   HAs quit as of May 2019, using nicotine patches to wean off.     Diabetes  Yes    Intervention  Provide education about signs/symptoms and action to take for hypo/hyperglycemia.;Provide education about proper nutrition, including hydration, and aerobic/resistive exercise prescription along with prescribed medications to achieve blood glucose in normal ranges: Fasting glucose 65-99 mg/dL    Expected Outcomes  Short Term: Participant verbalizes understanding of the signs/symptoms and immediate care of hyper/hypoglycemia, proper foot care and importance of medication, aerobic/resistive exercise and nutrition plan for blood glucose control.;Long Term: Attainment of HbA1C < 7%.    Hypertension  Yes    Intervention  Provide education on lifestyle modifcations including regular physical activity/exercise, weight management, moderate sodium restriction and increased consumption of fresh fruit, vegetables, and low fat dairy, alcohol moderation, and smoking cessation.;Monitor prescription use compliance.    Expected Outcomes  Short Term: Continued assessment and intervention until BP is < 140/44m HG in hypertensive participants. < 130/8101mHG in hypertensive  participants with diabetes, heart failure or chronic kidney disease.;Long Term: Maintenance of blood pressure at goal levels.    Lipids  Yes    Intervention  Provide education and support for participant on nutrition & aerobic/resistive exercise along with prescribed medications to achieve LDL <7038mHDL >43m44m  Expected Outcomes  Short Term: Participant states understanding of desired cholesterol values and is compliant with medications prescribed. Participant is following exercise prescription and nutrition guidelines.;Long Term: Cholesterol controlled with medications as prescribed, with individualized exercise RX and with personalized nutrition plan. Value goals: LDL < 70mg44mL > 40 mg.        Personal Goals Discharge: Goals and Risk Factor Review    Row Name 10/21/17 0807 11/06/17 0823           Core Components/Risk Factors/Patient Goals Review   Personal Goals Review  Other;Weight Management/Obesity;Hypertension;Lipids  Weight Management/Obesity;Hypertension;Lipids      Review  Don sTimmothy Soursl has  trouble with his feet and knees.  he wears good supportive shoes and orthortics which is all that is recommended by his Dr.  He has noticed having more energy and feelng better and feels the benefit to his heart is worth the foot and knee trouble.  He has not been able to walk outside due to hot weather.  He has had some trouble staying asleep since his heart event  and will contact Dr about this issue.  We discussed possibility of water walking as an alternative.    Timmothy Sours has gotten new inserts.  He has been holding steady around 208-210.  He is still aiming for 200 lbs and continues to work on his diet and exercise.  He continues to do well on medications.  He still checks his blood pressures and blood sugars daily.       Expected Outcomes  Short - call his Dr about sleeping this week Long - develop a plan to help with sleep long term, continue exercise  Short: Continue to work on his weight loss.   Long: Continue to focus on his risk factors.          Exercise Goals and Review: Exercise Goals    Row Name 09/11/17 1316             Exercise Goals   Increase Physical Activity  Yes       Intervention  Provide advice, education, support and counseling about physical activity/exercise needs.;Develop an individualized exercise prescription for aerobic and resistive training based on initial evaluation findings, risk stratification, comorbidities and participant's personal goals.       Expected Outcomes  Short Term: Attend rehab on a regular basis to increase amount of physical activity.;Long Term: Add in home exercise to make exercise part of routine and to increase amount of physical activity.;Long Term: Exercising regularly at least 3-5 days a week.       Increase Strength and Stamina  Yes       Intervention  Provide advice, education, support and counseling about physical activity/exercise needs.;Develop an individualized exercise prescription for aerobic and resistive training based on initial evaluation findings, risk stratification, comorbidities and participant's personal goals.       Expected Outcomes  Short Term: Increase workloads from initial exercise prescription for resistance, speed, and METs.;Short Term: Perform resistance training exercises routinely during rehab and add in resistance training at home;Long Term: Improve cardiorespiratory fitness, muscular endurance and strength as measured by increased METs and functional capacity (6MWT)       Able to understand and use rate of perceived exertion (RPE) scale  Yes       Intervention  Provide education and explanation on how to use RPE scale       Expected Outcomes  Short Term: Able to use RPE daily in rehab to express subjective intensity level;Long Term:  Able to use RPE to guide intensity level when exercising independently       Able to understand and use Dyspnea scale  Yes       Intervention  Provide education and explanation  on how to use Dyspnea scale       Expected Outcomes  Short Term: Able to use Dyspnea scale daily in rehab to express subjective sense of shortness of breath during exertion;Long Term: Able to use Dyspnea scale to guide intensity level when exercising independently       Knowledge and understanding of Target Heart Rate Range (THRR)  Yes  Intervention  Provide education and explanation of THRR including how the numbers were predicted and where they are located for reference       Expected Outcomes  Short Term: Able to state/look up THRR;Short Term: Able to use daily as guideline for intensity in rehab;Long Term: Able to use THRR to govern intensity when exercising independently       Able to check pulse independently  Yes       Intervention  Provide education and demonstration on how to check pulse in carotid and radial arteries.;Review the importance of being able to check your own pulse for safety during independent exercise       Expected Outcomes  Short Term: Able to explain why pulse checking is important during independent exercise;Long Term: Able to check pulse independently and accurately       Understanding of Exercise Prescription  Yes       Intervention  Provide education, explanation, and written materials on patient's individual exercise prescription       Expected Outcomes  Short Term: Able to explain program exercise prescription;Long Term: Able to explain home exercise prescription to exercise independently          Nutrition & Weight - Outcomes: Pre Biometrics - 09/11/17 1314      Pre Biometrics   Height  5' 11" (1.803 m)    Weight  209 lb 6.4 oz (95 kg)    Waist Circumference  43.5 inches    Hip Circumference  42 inches    Waist to Hip Ratio  1.04 %    BMI (Calculated)  29.22    Single Leg Stand  5.2 seconds      Post Biometrics - 11/04/17 0855       Post  Biometrics   Height  5' 11" (1.803 m)    Weight  210 lb (95.3 kg)    Waist Circumference  43.5 inches    Hip  Circumference  41 inches    Waist to Hip Ratio  1.06 %    BMI (Calculated)  29.3    Single Leg Stand  12.21 seconds       Nutrition: Nutrition Therapy & Goals - 10/21/17 1055      Nutrition Therapy   Diet  Instructed on a meal plan based on 1800 calories including dietary guidelines for diabetes and heart healthy dietary guidelines    Drug/Food Interactions  Statins/Certain Fruits    Protein (specify units)  8    Fiber  25 grams    Whole Grain Foods  3 servings    Saturated Fats  13 max. grams    Fruits and Vegetables  5 servings/day    Sodium  1500 grams      Personal Nutrition Goals   Nutrition Goal  Include a protein food at breakfast or mid-morning snack.    Personal Goal #2  Include a small snack in the afternoon such as fruit.    Personal Goal #3  Add as many non-starchy vegetables as possible.    Personal Goal #4  Read labels for saturated fat and sodium.      Intervention Plan   Intervention  Prescribe, educate and counsel regarding individualized specific dietary modifications aiming towards targeted core components such as weight, hypertension, lipid management, diabetes, heart failure and other comorbidities.;Nutrition handout(s) given to patient.    Expected Outcomes  Short Term Goal: Understand basic principles of dietary content, such as calories, fat, sodium, cholesterol and nutrients.;Short Term Goal: A plan has  been developed with personal nutrition goals set during dietitian appointment.;Long Term Goal: Adherence to prescribed nutrition plan.       Nutrition Discharge: Nutrition Assessments - 11/13/17 0945      MEDFICTS Scores   Pre Score  23    Post Score  42    Score Difference  19       Education Questionnaire Score: Knowledge Questionnaire Score - 11/13/17 0944      Knowledge Questionnaire Score   Pre Score  15/26    Post Score  19/26   test reviewed with pt today      Goals reviewed with patient; copy given to patient.

## 2017-11-18 NOTE — Progress Notes (Signed)
Cardiac Individual Treatment Plan  Patient Details  Name: Henry [name] MRN: [id number] Date of Birth: [birth year] Referring Provider:     Cardiac Rehab from 09/11/2017 in [facility] Cardiac and Pulmonary Rehab  Referring Provider  Paraschos      Initial Encounter Date:    Cardiac Rehab from 09/11/2017 in [facility] Cardiac and Pulmonary Rehab  Date  09/11/17      Visit Diagnosis: ST elevation myocardial infarction (STEMI), unspecified artery [facility])  Status post coronary artery stent placement  Patient's Home Medications on Admission:  Current Outpatient Medications:  .  acetaminophen (TYLENOL) 325 MG tablet, Take 2 tablets (650 mg total) by mouth every 6 (six) hours as needed for mild pain (or Fever >/= 101). (Patient not taking: Reported on 08/20/2017), Disp: , Rfl:  .  albuterol (PROVENTIL HFA;VENTOLIN HFA) 108 (90 Base) MCG/ACT inhaler, Inhale 2 puffs into the lungs every 6 (six) hours as needed for wheezing or shortness of breath., Disp: 1 Inhaler, Rfl: 0 .  aspirin EC 81 MG EC tablet, Take 1 tablet (81 mg total) by mouth daily., Disp: , Rfl:  .  atorvastatin (LIPITOR) 80 MG tablet, Take 1 tablet (80 mg total) by mouth daily at 6 PM., Disp: 30 tablet, Rfl: 2 .  cetirizine (ZYRTEC) 10 MG tablet, Take 10 mg by mouth daily., Disp: , Rfl:  .  fluticasone (FLONASE) 50 MCG/ACT nasal spray, Place 1 spray into both nostrils daily., Disp: , Rfl:  .  HYDROcodone-acetaminophen (NORCO/VICODIN) 5-325 MG tablet, Take 1 tablet by mouth every 6 (six) hours as needed. Pt to take twice a day as needed., Disp: , Rfl:  .  insulin detemir (LEVEMIR) 100 UNIT/ML injection, Inject 0.5 mLs (50 Units total) into the skin 2 (two) times daily., Disp: 10 mL, Rfl: 11 .  irbesartan (AVAPRO) 300 MG tablet, TAKE ONE TABLET EVERY DAY, Disp: 30 tablet, Rfl: 2 .  loratadine (CLARITIN) 10 MG tablet, Take 1 tablet (10 mg total) by mouth daily as needed for allergies. (Patient not taking: Reported on 08/20/2017), Disp: 30 tablet,  Rfl: 0 .  metoprolol tartrate (LOPRESSOR) 25 MG tablet, Take 1 tablet (25 mg total) by mouth 2 (two) times daily., Disp: 60 tablet, Rfl: 2 .  montelukast (SINGULAIR) 10 MG tablet, Take 1 tablet by mouth daily., Disp: , Rfl:  .  multivitamin-iron-minerals-folic acid (CENTRUM) chewable tablet, Chew 1 tablet by mouth daily., Disp: , Rfl:  .  nicotine (NICODERM CQ - DOSED IN MG/24 HOURS) 14 mg/24hr patch, Place 1 patch (14 mg total) onto the skin daily. (Patient taking differently: Place 7 mg onto the skin daily. ), Disp: 28 patch, Rfl: 0 .  nitroGLYCERIN (NITROSTAT) 0.4 MG SL tablet, Place 1 tablet (0.4 mg total) under the tongue every 5 (five) minutes as needed for chest pain. (Patient not taking: Reported on 09/23/2017), Disp: 25 tablet, Rfl: 2 .  sitaGLIPtin-metformin (JANUMET) 50-1000 MG tablet, Take 1 tablet by mouth 2 (two) times daily with a meal., Disp: , Rfl:  .  ticagrelor (BRILINTA) 90 MG TABS tablet, Take 1 tablet (90 mg total) by mouth 2 (two) times daily., Disp: 60 tablet, Rfl: 0  Past Medical History: Past Medical History:  Diagnosis Date  . Arthritis   . CAD (coronary artery disease)    a. anterior stemi 08/10/2017; b. LHC 08/10/17: pLAD 70, p-mLAD 99 thrombotic, ostD1 70, p-mLCx 30, pRCA-1 50 calcified, pRCA-2 99 calcified, dRCA 30. Successful PCI/DES to p-mLAD x 2  . Cancer ([place])    skin  .  CKD (chronic kidney disease), stage II   . Glaucoma   . Hyperlipidemia   . Hypertension   . Insulin dependent diabetes mellitus ([place])   . Ischemic cardiomyopathy    a. TTE 08/10/17: technically difficult study, no diagnostic RWMA, EF 40-50%, poor windows, very difficult study   . S/P angioplasty with stent 08/10/17 emergently to LAD ,  08/13/17 atherectomy and stent to pRCA  08/14/2017    Tobacco Use: Social History   Tobacco Use  Smoking Status Former Smoker  . Packs/day: 1.00  . Years: 50.00  . Pack years: 50.00  . Types: Cigarettes  . Last attempt to quit: 08/2017  . Years since quitting:  0.2  Smokeless Tobacco Never Used  Tobacco Comment   Has Quit as of May 2019- reviewed relaspe and will continue to followup    Labs: Recent Review Flowsheet Data    Labs for ITP Cardiac and Pulmonary Rehab Latest Ref Rng & Units 01/25/2016 01/26/2016 01/27/2016 08/10/2017   Cholestrol 0 - 200 mg/dL - - - 132   LDLCALC 0 - 99 mg/dL - - - 65   HDL >40 mg/dL - - - 35(L)   Trlycerides <150 mg/dL - - - 159(H)   Hemoglobin A1c 4.8 - 5.6 % - 6.7(H) 6.9(H) 6.7(H)   HCO3 20.0 - 28.0 mmol/L 30.4(H) - - -       Exercise Target Goals: Exercise Program Goal: Individual exercise prescription set using results from initial 6 min walk test and THRR while considering  patient's activity barriers and safety.   Exercise Prescription Goal: Initial exercise prescription builds to 30-45 minutes a day of aerobic activity, 2-3 days per week.  Home exercise guidelines will be given to patient during program as part of exercise prescription that the participant will acknowledge.  Activity Barriers & Risk Stratification: Activity Barriers & Cardiac Risk Stratification - 09/11/17 1324      Activity Barriers & Cardiac Risk Stratification   Activity Barriers  Arthritis   Feet, knees and hands   Cardiac Risk Stratification  Moderate       6 Minute Walk: 6 Minute Walk    Row Name 09/11/17 1319 11/04/17 0855       6 Minute Walk   Phase  -  Discharge    Distance  1058 feet  1325 feet    Distance % Change  -  25 %    Distance Feet Change  -  267 ft    Walk Time  6 minutes  6 minutes    # of Rest Breaks  0  0    MPH  2  2.5    METS  2.15  2.65    RPE  11  11    Perceived Dyspnea   0  0    VO2 Peak  7.53  9.3    Symptoms  Yes (comment)  No    Comments  knee pain 1/10  knee pain 6/10    Resting HR  69 bpm  70 bpm    Resting BP  104/80  128/70    Resting Oxygen Saturation   92 %  -    Exercise Oxygen Saturation  during 6 min walk  93 %  -    Max Ex. HR  86 bpm  91 bpm    Max Ex. BP  126/58   154/60    2 Minute Post BP  118/68  -       Oxygen Initial Assessment:  Oxygen Re-Evaluation:   Oxygen Discharge (Final Oxygen Re-Evaluation):   Initial Exercise Prescription: Initial Exercise Prescription - 09/11/17 1300      Date of Initial Exercise RX and Referring Provider   Date  09/11/17    Referring Provider  Paraschos      Treadmill   MPH  2    Grade  0    Minutes  15    METs  2.15      Recumbant Bike   Level  1    RPM  60    Watts  5    Minutes  15    METs  2      T5 Nustep   Level  1    SPM  80    Minutes  15    METs  2      Prescription Details   Frequency (times per week)  3    Duration  Progress to 30 minutes of continuous aerobic without signs/symptoms of physical distress      Intensity   THRR 40-80% of Max Heartrate  100-132    Ratings of Perceived Exertion  11-13    Perceived Dyspnea  0-4      Resistance Training   Training Prescription  Yes    Weight  4 lb    Reps  10-15       Perform Capillary Blood Glucose checks as needed.  Exercise Prescription Changes: Exercise Prescription Changes    Row Name 09/11/17 1300 09/16/17 1400 09/23/17 0900 10/01/17 1500 10/14/17 1300     Response to Exercise   Blood Pressure (Admit)  104/80  108/68  108/68  110/62  118/58   Blood Pressure (Exercise)  126/58  134/70  134/70  138/60  142/60   Blood Pressure (Exit)  118/68  114/68  114/68  126/64  114/60   Heart Rate (Admit)  65 bpm  71 bpm  71 bpm  66 bpm  70 bpm   Heart Rate (Exercise)  87 bpm  92 bpm  92 bpm  116 bpm  90 bpm   Heart Rate (Exit)  79 bpm  76 bpm  76 bpm  70 bpm  76 bpm   Oxygen Saturation (Admit)  94 %  -  -  -  -   Oxygen Saturation (Exercise)  92 %  -  -  -  -   Oxygen Saturation (Exit)  95 %  -  -  -  -   Rating of Perceived Exertion (Exercise)  -  _0 Symptoms  -  none  none  none  none   Comments  -  first full day of exercise  first full day of exercise  -  -   Duration  -  Continue with 30 min of aerobic  exercise without signs/symptoms of physical distress.  Continue with 30 min of aerobic exercise without signs/symptoms of physical distress.  Continue with 30 min of aerobic exercise without signs/symptoms of physical distress.  Continue with 30 min of aerobic exercise without signs/symptoms of physical distress.   Intensity  -  THRR unchanged  THRR unchanged  THRR unchanged  THRR unchanged     Progression   Progression  -  Continue to progress workloads to maintain intensity without signs/symptoms of physical distress.  Continue to progress workloads to maintain intensity without signs/symptoms of physical distress.  Continue to progress workloads to maintain intensity without signs/symptoms of physical distress.  Continue to progress workloads to maintain intensity without signs/symptoms of physical distress.   Average METs  -  2.33  2.33  2.34  2.3     Resistance Training   Training Prescription  -  Yes  Yes  Yes  Yes   Weight  -  4 lbs  4 lbs  5 lbs  5 lb   Reps  -  10-15  10-15  10-15  10-15     Interval Training   Interval Training  -  No  No  No  No     Treadmill   MPH  -  -  -  1.1  1.1   Grade  -  -  -  1  1   Minutes  -  -  -  15  15   METs  -  -  -  1.99  1.99     Recumbant Bike   Level  -  _0 -   Watts  -  _1 -   Minutes  -  _2 -   METs  -  2.66  2.66  2.83  -     T5 Nustep   Level  -  _3 SPM  -  -  -  -  80   Minutes  -  _4 METs  -  _5 Home Exercise Plan   Plans to continue exercise at  -  -  Home (comment) walk  Home (comment) walk  -   Frequency  -  -  Add 2 additional days to program exercise sessions.  Add 2 additional days to program exercise sessions.  -   Initial Home Exercises Provided  -  -  09/23/17  09/23/17  -   [place] Name 10/29/17 0900 11/11/17 1500           Response to Exercise   Blood Pressure (Admit)  102/60  104/58      Blood Pressure (Exercise)  140/64  142/60      Blood Pressure  (Exit)  112/62  126/70      Heart Rate (Admit)  70 bpm  67 bpm      Heart Rate (Exercise)  93 bpm  83 bpm      Heart Rate (Exit)  69 bpm  69 bpm      Rating of Perceived Exertion (Exercise)  12  13      Symptoms  none  none      Duration  Continue with 30 min of aerobic exercise without signs/symptoms of physical distress.  Continue with 30 min of aerobic exercise without signs/symptoms of physical distress.      Intensity  THRR unchanged  THRR unchanged        Progression   Progression  Continue to progress workloads to maintain intensity without signs/symptoms of physical distress.  Continue to progress workloads to maintain intensity without signs/symptoms of physical distress.      Average METs  2.32  2.6        Resistance Training   Training Prescription  Yes  Yes      Weight  5 lbs  5 lbs      Reps  10-15  10-15        Interval  Training   Interval Training  No  No        Treadmill   MPH  1.1  -      Grade  1  -      Minutes  15  -      METs  1.99  -        Recumbant Bike   Level  2  4      Watts  22  29      Minutes  15  15      METs  2.66  2.99        T5 Nustep   Level  2  4      Minutes  15  15      METs  2.3  2.2        Home Exercise Plan   Plans to continue exercise at  Home (comment) walk  Home (comment) walk      Frequency  Add 2 additional days to program exercise sessions.  Add 2 additional days to program exercise sessions.      Initial Home Exercises Provided  09/23/17  09/23/17         Exercise Comments: Exercise Comments    Row Name 09/16/17 8295 11/18/17 0823         Exercise Comments  First full day of exercise!  Patient was oriented to gym and equipment including functions, settings, policies, and procedures.  Patient's individual exercise prescription and treatment plan were reviewed.  All starting workloads were established based on the results of the 6 minute walk test done at initial orientation visit.  The plan for exercise progression was  also introduced and progression will be customized based on patient's performance and goals.   Henry [name] graduated today from  rehab with 36 sessions completed.  Details of the patient's exercise prescription and what He needs to do in order to continue the prescription and progress were discussed with patient.  Patient was given a copy of prescription and goals.  Patient verbalized understanding.  Majour plans to continue to exercise by joining the [employer].         Exercise Goals and Review: Exercise Goals    Row Name 09/11/17 1316             Exercise Goals   Increase Physical Activity  Yes       Intervention  Provide advice, education, support and counseling about physical activity/exercise needs.;Develop an individualized exercise prescription for aerobic and resistive training based on initial evaluation findings, risk stratification, comorbidities and participant's personal goals.       Expected Outcomes  Short Term: Attend rehab on a regular basis to increase amount of physical activity.;Long Term: Add in home exercise to make exercise part of routine and to increase amount of physical activity.;Long Term: Exercising regularly at least 3-5 days a week.       Increase Strength and Stamina  Yes       Intervention  Provide advice, education, support and counseling about physical activity/exercise needs.;Develop an individualized exercise prescription for aerobic and resistive training based on initial evaluation findings, risk stratification, comorbidities and participant's personal goals.       Expected Outcomes  Short Term: Increase workloads from initial exercise prescription for resistance, speed, and METs.;Short Term: Perform resistance training exercises routinely during rehab and add in resistance training at home;Long Term: Improve cardiorespiratory fitness, muscular endurance and strength as measured by increased METs and functional capacity (6MWT)  Able to understand and use rate  of perceived exertion (RPE) scale  Yes       Intervention  Provide education and explanation on how to use RPE scale       Expected Outcomes  Short Term: Able to use RPE daily in rehab to express subjective intensity level;Long Term:  Able to use RPE to guide intensity level when exercising independently       Able to understand and use Dyspnea scale  Yes       Intervention  Provide education and explanation on how to use Dyspnea scale       Expected Outcomes  Short Term: Able to use Dyspnea scale daily in rehab to express subjective sense of shortness of breath during exertion;Long Term: Able to use Dyspnea scale to guide intensity level when exercising independently       Knowledge and understanding of Target Heart Rate Range (THRR)  Yes       Intervention  Provide education and explanation of THRR including how the numbers were predicted and where they are located for reference       Expected Outcomes  Short Term: Able to state/look up THRR;Short Term: Able to use daily as guideline for intensity in rehab;Long Term: Able to use THRR to govern intensity when exercising independently       Able to check pulse independently  Yes       Intervention  Provide education and demonstration on how to check pulse in carotid and radial arteries.;Review the importance of being able to check your own pulse for safety during independent exercise       Expected Outcomes  Short Term: Able to explain why pulse checking is important during independent exercise;Long Term: Able to check pulse independently and accurately       Understanding of Exercise Prescription  Yes       Intervention  Provide education, explanation, and written materials on patient's individual exercise prescription       Expected Outcomes  Short Term: Able to explain program exercise prescription;Long Term: Able to explain home exercise prescription to exercise independently          Exercise Goals Re-Evaluation : Exercise Goals Re-Evaluation     Row Name 09/16/17 0903 09/23/17 0939 10/01/17 1511 10/14/17 1304 10/29/17 0926     Exercise Goal Re-Evaluation   Exercise Goals Review  Increase Physical Activity;Able to understand and use rate of perceived exertion (RPE) scale;Knowledge and understanding of Target Heart Rate Range (THRR);Understanding of Exercise Prescription  Increase Physical Activity;Understanding of Exercise Prescription;Knowledge and understanding of Target Heart Rate Range (THRR);Increase Strength and Stamina;Able to understand and use rate of perceived exertion (RPE) scale;Able to check pulse independently  Increase Physical Activity;Increase Strength and Stamina;Understanding of Exercise Prescription  Increase Physical Activity;Able to understand and use rate of perceived exertion (RPE) scale;Knowledge and understanding of Target Heart Rate Range (THRR);Increase Strength and Stamina  Increase Physical Activity;Increase Strength and Stamina;Understanding of Exercise Prescription   Comments  Reviewed RPE scale, THR and program prescription with pt today.  Pt voiced understanding and was given a copy of goals to take home.   Henry [name] walks outside for exercise currently.  He will check his pulse and stretch afterwards. He has arthritis which affects strength training. [facility]  Henry [name] has been doing well in rehab.  Everything continues to be a 12, but weill be start to increae his workloads.  We will continue to monitor his progression.  Pt is tolerating exercise well.  Still has some foot pain on TM but is able to finish alloted time.  Henry [name] has been doing well in rehab.  He has been holding steady on his workloads.  We will start to increase his workloads and continue to monitor his progress.    Expected Outcomes  Short: Use RPE daily to regulate intensity.  Long: Follow program prescription in THR.  Short - check pulse while exercising on his own, stretch after walking Long - maintain improved MET level  after completing HT  Short: Increase all workloads.  Long: Continue to add in exercise at home.   Short - increase speed and/or grade on TM Long - increase overall MET level  Short: Increase workloads.  Long: Continue to improve strength and stamina.    [place] Name 11/06/17 6503 11/11/17 1504           Exercise Goal Re-Evaluation   Exercise Goals Review  Increase Physical Activity;Increase Strength and Stamina;Understanding of Exercise Prescription  Increase Physical Activity;Increase Strength and Stamina;Understanding of Exercise Prescription      Comments  Henry [name] has been doing well in rehab. He is exercising 3-4 days a week by walking.  He is starting to feel stronger.  He improved his post 6MWT!!  To maintain he is going to keep walking daily.   Henry [name] continues to do well in rehab. He is already nearing graduation. He will continue to walk each day.       Expected Outcomes  Short: Continue to work towards graduation.  Long: Continue to improve strength and stamina.   Short: Graduation!  Long: Continue to exercise independently         Discharge Exercise Prescription (Final Exercise Prescription Changes): Exercise Prescription Changes - 11/11/17 1500      Response to Exercise   Blood Pressure (Admit)  104/58    Blood Pressure (Exercise)  142/60    Blood Pressure (Exit)  126/70    Heart Rate (Admit)  67 bpm    Heart Rate (Exercise)  83 bpm    Heart Rate (Exit)  69 bpm    Rating of Perceived Exertion (Exercise)  13    Symptoms  none    Duration  Continue with 30 min of aerobic exercise without signs/symptoms of physical distress.    Intensity  THRR unchanged      Progression   Progression  Continue to progress workloads to maintain intensity without signs/symptoms of physical distress.    Average METs  2.6      Resistance Training   Training Prescription  Yes    Weight  5 lbs    Reps  10-15      Interval Training   Interval Training  No      Recumbant Bike   Level  4    Watts  29      Minutes  15    METs  2.99      T5 Nustep   Level  4    Minutes  15    METs  2.2      Home Exercise Plan   Plans to continue exercise at  Home (comment)   walk   Frequency  Add 2 additional days to program exercise sessions.    Initial Home Exercises Provided  09/23/17       Nutrition:  Target Goals: Understanding of nutrition guidelines, daily intake of sodium <1528m, cholesterol <203m calories 30% from fat and 7% or less from saturated fats,  daily to have 5 or more servings of fruits and vegetables.  Biometrics: Pre Biometrics - 09/11/17 1314      Pre Biometrics   Height  5' 11" (1.803 m)    Weight  209 lb 6.4 oz (95 kg)    Waist Circumference  43.5 inches    Hip Circumference  42 inches    Waist to Hip Ratio  1.04 %    BMI (Calculated)  29.22    Single Leg Stand  5.2 seconds      Post Biometrics - 11/04/17 0855       Post  Biometrics   Height  5' 11" (1.803 m)    Weight  210 lb (95.3 kg)    Waist Circumference  43.5 inches    Hip Circumference  41 inches    Waist to Hip Ratio  1.06 %    BMI (Calculated)  29.3    Single Leg Stand  12.21 seconds       Nutrition Therapy Plan and Nutrition Goals: Nutrition Therapy & Goals - 10/21/17 1055      Nutrition Therapy   Diet  Instructed on a meal plan based on 1800 calories including dietary guidelines for diabetes and heart healthy dietary guidelines    Drug/Food Interactions  Statins/Certain Fruits    Protein (specify units)  8    Fiber  25 grams    Whole Grain Foods  3 servings    Saturated Fats  13 max. grams    Fruits and Vegetables  5 servings/day    Sodium  1500 grams      Personal Nutrition Goals   Nutrition Goal  Include a protein food at breakfast or mid-morning snack.    Personal Goal #2  Include a small snack in the afternoon such as fruit.    Personal Goal #3  Add as many non-starchy vegetables as possible.    Personal Goal #4  Read labels for saturated fat and sodium.      Intervention Plan    Intervention  Prescribe, educate and counsel regarding individualized specific dietary modifications aiming towards targeted core components such as weight, hypertension, lipid management, diabetes, heart failure and other comorbidities.;Nutrition handout(s) given to patient.    Expected Outcomes  Short Term Goal: Understand basic principles of dietary content, such as calories, fat, sodium, cholesterol and nutrients.;Short Term Goal: A plan has been developed with personal nutrition goals set during dietitian appointment.;Long Term Goal: Adherence to prescribed nutrition plan.       Nutrition Assessments: Nutrition Assessments - 11/13/17 0945      MEDFICTS Scores   Pre Score  23    Post Score  42    Score Difference  19       Nutrition Goals Re-Evaluation: Nutrition Goals Re-Evaluation    [place] Name 11/06/17 0827             Goals   Nutrition Goal  Include a protein food at breakfast or mid-morning snack. More vegetables, reading labels       Comment  Henry [name] has been well with his diet.  He has been shocked by how much sodium is in the foods he thought were healthy.  He has increased his protein intake and eating more vegetables.  He is planning to continue with his vigilance on the sodium intake.       Expected Outcome  Short: Continue to watch sodium.  Long: Continue to follow recommendations.  Nutrition Goals Discharge (Final Nutrition Goals Re-Evaluation): Nutrition Goals Re-Evaluation - 11/06/17 0827      Goals   Nutrition Goal  Include a protein food at breakfast or mid-morning snack. More vegetables, reading labels    Comment  Henry [name] has been well with his diet.  He has been shocked by how much sodium is in the foods he thought were healthy.  He has increased his protein intake and eating more vegetables.  He is planning to continue with his vigilance on the sodium intake.    Expected Outcome  Short: Continue to watch sodium.  Long: Continue to follow recommendations.          Psychosocial: Target Goals: Acknowledge presence or absence of significant depression and/or stress, maximize coping skills, provide positive support system. Participant is able to verbalize types and ability to use techniques and skills needed for reducing stress and depression.   Initial Review & Psychosocial Screening: Initial Psych Review & Screening - 09/11/17 1331      Initial Review   Current issues with  None Identified      Family Dynamics   Good Support System?  Yes   friends     Barriers   Psychosocial barriers to participate in program  The patient should benefit from training in stress management and relaxation.;There are no identifiable barriers or psychosocial needs.      Screening Interventions   Interventions  Encouraged to exercise;Provide feedback about the scores to participant    Expected Outcomes  Short Term goal: Utilizing psychosocial counselor, staff and physician to assist with identification of specific Stressors or current issues interfering with healing process. Setting desired goal for each stressor or current issue identified.;Long Term Goal: Stressors or current issues are controlled or eliminated.;Short Term goal: Identification and review with participant of any Quality of Life or Depression concerns found by scoring the questionnaire.;Long Term goal: The participant improves quality of Life and PHQ9 Scores as seen by post scores and/or verbalization of changes       Quality of Life Scores:  Quality of Life - 11/13/17 0945      Quality of Life   Select  Quality of Life      Quality of Life Scores   Health/Function Pre  28.4 %    Health/Function Post  25.38 %    Health/Function % Change  -10.63 %    Socioeconomic Pre  30 %    Socioeconomic Post  26.75 %    Socioeconomic % Change   -10.83 %    Psych/Spiritual Pre  30 %    Psych/Spiritual Post  30 %    Psych/Spiritual % Change  0 %    Family Pre  30 %    Family Post  27 %    Family %  Change  -10 %    GLOBAL Pre  29.27 %    GLOBAL Post  26.95 %    GLOBAL % Change  -7.93 %      Scores of 19 and below usually indicate a poorer quality of life in these areas.  A difference of  2-3 points is a clinically meaningful difference.  A difference of 2-3 points in the total score of the Quality of Life Index has been associated with significant improvement in overall quality of life, self-image, physical symptoms, and general health in studies assessing change in quality of life.  PHQ-9: Recent Review Flowsheet Data    Depression screen [facility] 2/9 11/13/2017 09/11/2017 08/25/2017  Decreased Interest 1 0 0   Down, Depressed, Hopeless 0 0 0   PHQ - 2 Score 1 0 0   Altered sleeping 2 0 -   Tired, decreased energy 1 2  -   Change in appetite 0 0 -   Feeling bad or failure about yourself  0 0 -   Trouble concentrating 0 0 -   Moving slowly or fidgety/restless 0 0 -   Suicidal thoughts 0 0 -   PHQ-9 Score 4 2 -   Difficult doing work/chores Not difficult at all Not difficult at all -     Interpretation of Total Score  Total Score Depression Severity:  1-4 = Minimal depression, 5-9 = Mild depression, 10-14 = Moderate depression, 15-19 = Moderately severe depression, 20-27 = Severe depression   Psychosocial Evaluation and Intervention: Psychosocial Evaluation - 09/25/17 0948      Psychosocial Evaluation & Interventions   Interventions  Encouraged to exercise with the program and follow exercise prescription    Comments  Counselor met with Mr. Mendel Ryder Henry [name]) today for initial psychosocial evaluation.  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  He has a friend that he regards his primary support person and reports no family locally.  Henry [name] has had a chronic history of kidney stones (but none in the past year); some arthritis and diabetes in addition to his heart condition.  He reports sleeping "okay" and having a good appetite.  Henry [name] denies a history of  depression or anxiety or any current symptoms and states he is typically in a positive mood most of the time.  Henry [name] quit smoking on the day of his heart attack and although he has cravings - he feels empowered to have quit.  His goals are to increase his stamina and strength while in thie program.  Staff will follow.     Expected Outcomes  Short:  Henry [name] will continue to remain nicotene free and exercise for his heart health.  Long:  Henry [name] will exercise consistently to increase his stamina and strength and maintain any progress made in this program.     Continue Psychosocial Services   Follow up required by staff       Psychosocial Re-Evaluation: Psychosocial Re-Evaluation    [place] Name 10/21/17 0814 11/06/17 0825           Psychosocial Re-Evaluation   Current issues with  None Identified  Current Sleep Concerns      Comments  Henry [name] reports not getting stressed.  Henry [name] has been having some sleep problems.  He has a bed time routine.   He will be here for the sleep class today.  He always wakes between 2-4am and is unable to get back to sleep.  He is going to talk with Juliann Pulse some more about it.   The best part of coming to the program has been the exercise to get him back in shape and recovering from the heart attack.        Expected Outcomes  Continue to use current strategies to remain stress free.   Short: Attend sleep class.  Long: Continue to stay positive overall.       Interventions  Encouraged to attend Cardiac Rehabilitation for the exercise  Encouraged to attend Cardiac Rehabilitation for the exercise      Continue Psychosocial Services   Follow up required by staff  Follow up required by staff  Psychosocial Discharge (Final Psychosocial Re-Evaluation): Psychosocial Re-Evaluation - 11/06/17 0825      Psychosocial Re-Evaluation   Current issues with  Current Sleep Concerns    Comments  Henry [name] has been having some sleep problems.  He has a bed time routine.   He will be here for the sleep  class today.  He always wakes between 2-4am and is unable to get back to sleep.  He is going to talk with Juliann Pulse some more about it.   The best part of coming to the program has been the exercise to get him back in shape and recovering from the heart attack.      Expected Outcomes  Short: Attend sleep class.  Long: Continue to stay positive overall.     Interventions  Encouraged to attend Cardiac Rehabilitation for the exercise    Continue Psychosocial Services   Follow up required by staff       Vocational Rehabilitation: Provide vocational rehab assistance to qualifying candidates.   Vocational Rehab Evaluation & Intervention: Vocational Rehab - 09/11/17 1337      Initial Vocational Rehab Evaluation & Intervention   Assessment shows need for Vocational Rehabilitation  No       Education: Education Goals: Education classes will be provided on a variety of topics geared toward better understanding of heart health and risk factor modification. Participant will state understanding/return demonstration of topics presented as noted by education test scores.  Learning Barriers/Preferences: Learning Barriers/Preferences - 09/11/17 1336      Learning Barriers/Preferences   Learning Barriers  None    Learning Preferences  None       Education Topics:  AED/CPR: - Group verbal and written instruction with the use of models to demonstrate the basic use of the AED with the basic ABC's of resuscitation.   Cardiac Rehab from 11/13/2017 in [facility] Cardiac and Pulmonary Rehab  Date  10/23/17  Educator  SB  Instruction Review Code  1- Verbalizes Understanding      General Nutrition Guidelines/Fats and Fiber: -Group instruction provided by verbal, written material, models and posters to present the general guidelines for heart healthy nutrition. Gives an explanation and review of dietary fats and fiber.   Cardiac Rehab from 11/13/2017 in [facility] Cardiac and Pulmonary Rehab  Date  10/21/17  Educator   CR  Instruction Review Code  1- Verbalizes Understanding      Controlling Sodium/Reading Food Labels: -Group verbal and written material supporting the discussion of sodium use in heart healthy nutrition. Review and explanation with models, verbal and written materials for utilization of the food label.   Cardiac Rehab from 11/13/2017 in [facility] Cardiac and Pulmonary Rehab  Date  10/28/17  Educator  SB  Instruction Review Code  1- Verbalizes Understanding      Exercise Physiology & General Exercise Guidelines: - Group verbal and written instruction with models to review the exercise physiology of the cardiovascular system and associated critical values. Provides general exercise guidelines with specific guidelines to those with heart or lung disease.    Cardiac Rehab from 11/13/2017 in [facility] Cardiac and Pulmonary Rehab  Date  11/04/17  Educator  [facility]  Instruction Review Code  1- Verbalizes Understanding      Aerobic Exercise & Resistance Training: - Gives group verbal and written instruction on the various components of exercise. Focuses on aerobic and resistive training programs and the benefits of this training and how to safely progress through these programs..   Cardiac Rehab from 11/13/2017 in [facility]  Cardiac and Pulmonary Rehab  Date  11/13/17  Educator  AS  Instruction Review Code  1- Verbalizes Understanding      Flexibility, Balance, Mind/Body Relaxation: Provides group verbal/written instruction on the benefits of flexibility and balance training, including mind/body exercise modes such as yoga, pilates and tai chi.  Demonstration and skill practice provided.   Cardiac Rehab from 11/13/2017 in [facility] Cardiac and Pulmonary Rehab  Date  09/25/17  Educator  AS  Instruction Review Code  1- Verbalizes Understanding      Stress and Anxiety: - Provides group verbal and written instruction about the health risks of elevated stress and causes of high stress.  Discuss the correlation between  heart/lung disease and anxiety and treatment options. Review healthy ways to manage with stress and anxiety.   Cardiac Rehab from 11/13/2017 in [facility] Cardiac and Pulmonary Rehab  Date  10/14/17  Educator  [facility]  Instruction Review Code  1- Verbalizes Understanding      Depression: - Provides group verbal and written instruction on the correlation between heart/lung disease and depressed mood, treatment options, and the stigmas associated with seeking treatment.   Cardiac Rehab from 11/13/2017 in [facility] Cardiac and Pulmonary Rehab  Date  11/11/17  Educator  [facility]  Instruction Review Code  1- Verbalizes Understanding      Anatomy & Physiology of the Heart: - Group verbal and written instruction and models provide basic cardiac anatomy and physiology, with the coronary electrical and arterial systems. Review of Valvular disease and Heart Failure   Cardiac Rehab from 11/13/2017 in [facility] Cardiac and Pulmonary Rehab  Date  10/16/17  Educator  SB  Instruction Review Code  1- Verbalizes Understanding      Cardiac Procedures: - Group verbal and written instruction to review commonly prescribed medications for heart disease. Reviews the medication, class of the drug, and side effects. Includes the steps to properly store meds and maintain the prescription regimen. (beta blockers and nitrates)   Cardiac Rehab from 11/13/2017 in [facility] Cardiac and Pulmonary Rehab  Date  10/07/17  Educator  CE  Instruction Review Code  1- Verbalizes Understanding      Cardiac Medications I: - Group verbal and written instruction to review commonly prescribed medications for heart disease. Reviews the medication, class of the drug, and side effects. Includes the steps to properly store meds and maintain the prescription regimen.   Cardiac Rehab from 11/13/2017 in [facility] Cardiac and Pulmonary Rehab  Date  09/30/17  Educator  SB  Instruction Review Code  1- Verbalizes Understanding      Cardiac Medications II: -Group verbal and  written instruction to review commonly prescribed medications for heart disease. Reviews the medication, class of the drug, and side effects. (all other drug classes)   Cardiac Rehab from 11/13/2017 in [facility] Cardiac and Pulmonary Rehab  Date  09/18/17  Educator  CE  Instruction Review Code  1- Verbalizes Understanding       Go Sex-Intimacy & Heart Disease, Get SMART - Goal Setting: - Group verbal and written instruction through game format to discuss heart disease and the return to sexual intimacy. Provides group verbal and written material to discuss and apply goal setting through the application of the S.M.A.R.T. Method.   Cardiac Rehab from 11/13/2017 in [facility] Cardiac and Pulmonary Rehab  Date  10/07/17  Educator  CE  Instruction Review Code  1- Verbalizes Understanding      Other Matters of the Heart: - Provides group verbal, written materials and models  to describe Stable Angina and Peripheral Artery. Includes description of the disease process and treatment options available to the cardiac patient.   Exercise & Equipment Safety: - Individual verbal instruction and demonstration of equipment use and safety with use of the equipment.   Cardiac Rehab from 11/13/2017 in [facility] Cardiac and Pulmonary Rehab  Date  09/11/17  Educator  Sb  Instruction Review Code  1- Verbalizes Understanding      Infection Prevention: - Provides verbal and written material to individual with discussion of infection control including proper hand washing and proper equipment cleaning during exercise session.   Cardiac Rehab from 11/13/2017 in [facility] Cardiac and Pulmonary Rehab  Date  09/11/17  Educator  SB  Instruction Review Code  1- Verbalizes Understanding      Falls Prevention: - Provides verbal and written material to individual with discussion of falls prevention and safety.   Cardiac Rehab from 11/13/2017 in [facility] Cardiac and Pulmonary Rehab  Date  09/11/17  Educator  SB  Instruction Review Code  1-  Verbalizes Understanding      Diabetes: - Individual verbal and written instruction to review signs/symptoms of diabetes, desired ranges of glucose level fasting, after meals and with exercise. Acknowledge that pre and post exercise glucose checks will be done for 3 sessions at entry of program.   Cardiac Rehab from 11/13/2017 in [facility] Cardiac and Pulmonary Rehab  Date  09/11/17  Educator  SB  Instruction Review Code  1- Verbalizes Understanding      Know Your Numbers and Risk Factors: -Group verbal and written instruction about important numbers in your health.  Discussion of what are risk factors and how they play a role in the disease process.  Review of Cholesterol, Blood Pressure, Diabetes, and BMI and the role they play in your overall health.   Cardiac Rehab from 11/13/2017 in [facility] Cardiac and Pulmonary Rehab  Date  09/18/17  Educator  CE  Instruction Review Code  1- Verbalizes Understanding      Sleep Hygiene: -Provides group verbal and written instruction about how sleep can affect your health.  Define sleep hygiene, discuss sleep cycles and impact of sleep habits. Review good sleep hygiene tips.    Cardiac Rehab from 11/13/2017 in [facility] Cardiac and Pulmonary Rehab  Date  11/06/17  Educator  [facility]  Instruction Review Code  1- Verbalizes Understanding      Other: -Provides group and verbal instruction on various topics (see comments)   Knowledge Questionnaire Score: Knowledge Questionnaire Score - 11/13/17 0944      Knowledge Questionnaire Score   Pre Score  15/26    Post Score  19/26   test reviewed with pt today      Core Components/Risk Factors/Patient Goals at Admission: Personal Goals and Risk Factors at Admission - 09/11/17 1330      Core Components/Risk Factors/Patient Goals on Admission    Weight Management  Yes;Weight Loss;Obesity    Intervention  Weight Management: Develop a combined nutrition and exercise program designed to reach desired caloric intake,  while maintaining appropriate intake of nutrient and fiber, sodium and fats, and appropriate energy expenditure required for the weight goal.;Weight Management/Obesity: Establish reasonable short term and long term weight goals.    Admit Weight  209 lb (94.8 kg)    Goal Weight: Short Term  207 lb (93.9 kg)    Goal Weight: Long Term  205 lb (93 kg)    Expected Outcomes  Short Term: Continue to assess and modify interventions  until short term weight is achieved;Long Term: Adherence to nutrition and physical activity/exercise program aimed toward attainment of established weight goal;Weight Loss: Understanding of general recommendations for a balanced deficit meal plan, which promotes 1-2 lb weight loss per week and includes a negative energy balance of [phone] kcal/d    Tobacco Cessation  Yes    Intervention  Assist the participant in steps to quit. Provide individualized education and counseling about committing to Tobacco Cessation, relapse prevention, and pharmacological support that can be provided by physician.;Advice worker, assist with locating and accessing local/national Quit Smoking programs, and support quit date choice.    Expected Outcomes  Short Term: Will demonstrate readiness to quit, by selecting a quit date.;Short Term: Will quit all tobacco product use, adhering to prevention of relapse plan.;Long Term: Complete abstinence from all tobacco products for at least 12 months from quit date.   HAs quit as of May 2019, using nicotine patches to wean off.     Diabetes  Yes    Intervention  Provide education about signs/symptoms and action to take for hypo/hyperglycemia.;Provide education about proper nutrition, including hydration, and aerobic/resistive exercise prescription along with prescribed medications to achieve blood glucose in normal ranges: Fasting glucose 65-99 mg/dL    Expected Outcomes  Short Term: Participant verbalizes understanding of the signs/symptoms and  immediate care of hyper/hypoglycemia, proper foot care and importance of medication, aerobic/resistive exercise and nutrition plan for blood glucose control.;Long Term: Attainment of HbA1C < 7%.    Hypertension  Yes    Intervention  Provide education on lifestyle modifcations including regular physical activity/exercise, weight management, moderate sodium restriction and increased consumption of fresh fruit, vegetables, and low fat dairy, alcohol moderation, and smoking cessation.;Monitor prescription use compliance.    Expected Outcomes  Short Term: Continued assessment and intervention until BP is < 140/9m HG in hypertensive participants. < 130/836mHG in hypertensive participants with diabetes, heart failure or chronic kidney disease.;Long Term: Maintenance of blood pressure at goal levels.    Lipids  Yes    Intervention  Provide education and support for participant on nutrition & aerobic/resistive exercise along with prescribed medications to achieve LDL <7078mHDL >1m72m  Expected Outcomes  Short Term: Participant states understanding of desired cholesterol values and is compliant with medications prescribed. Participant is following exercise prescription and nutrition guidelines.;Long Term: Cholesterol controlled with medications as prescribed, with individualized exercise RX and with personalized nutrition plan. Value goals: LDL < 70mg33mL > 40 mg.       Core Components/Risk Factors/Patient Goals Review:  Goals and Risk Factor Review    Row Name 10/21/17 0807 11/06/17 0823           Core Components/Risk Factors/Patient Goals Review   Personal Goals Review  Other;Weight Management/Obesity;Hypertension;Lipids  Weight Management/Obesity;Hypertension;Lipids      Review  Henry [name] sTimmothy Soursl has trouble with his feet and knees.  he wears good supportive shoes and orthortics which is all that is recommended by his Dr.  He has noticed having more energy and feelng better and feels the benefit to his  heart is worth the foot and knee trouble.  He has not been able to walk outside due to hot weather.  He has had some trouble staying asleep since his heart event  and will contact Dr about this issue.  We discussed possibility of water walking as an alternative.    Henry [name] hTimmothy Soursgotten new inserts.  He has been holding steady around 208-210.  He is still  aiming for 200 lbs and continues to work on his diet and exercise.  He continues to do well on medications.  He still checks his blood pressures and blood sugars daily.       Expected Outcomes  Short - call his Dr about sleeping this week Long - develop a plan to help with sleep long term, continue exercise  Short: Continue to work on his weight loss.  Long: Continue to focus on his risk factors.          Core Components/Risk Factors/Patient Goals at Discharge (Final Review):  Goals and Risk Factor Review - 11/06/17 0823      Core Components/Risk Factors/Patient Goals Review   Personal Goals Review  Weight Management/Obesity;Hypertension;Lipids    Review  Henry [name] has gotten new inserts.  He has been holding steady around 208-210.  He is still aiming for 200 lbs and continues to work on his diet and exercise.  He continues to do well on medications.  He still checks his blood pressures and blood sugars daily.     Expected Outcomes  Short: Continue to work on his weight loss.  Long: Continue to focus on his risk factors.        ITP Comments: ITP Comments    Row Name 09/11/17 1320 09/24/17 0601 10/22/17 0543 11/18/17 9983     ITP Comments  Medical review completed. ITP sent to Dr Loleta Chance for review, changes as needed and signnature.  Documentation of diagnosis [facility] be found in [facility] 08/12/2017  30 day review. Continue with ITP unless directed changes per Medical Director review  New to program  30 day review. Continue with ITP unless directed changes per Medical Director review.    Discharge ITP sent and signed by Dr. Caryl Comes, covering for Dr. Sabra Heck.  Discharge  Summary routed to PCP and cardiologist.       Comments: Discharge ITP

## 2017-11-18 NOTE — Progress Notes (Signed)
Daily Session Note  Patient Details  Name: Henry [name] MRN: [id number] Date of Birth: 06-09-1945 Referring Provider:     Cardiac Rehab from 09/11/2017 in [facility] Cardiac and Pulmonary Rehab  Referring Provider  [place]      Encounter Date: 11/18/2017  Check In: Session Check In - 11/18/17 0820      Check-In   Supervising physician immediately available to respond to emergencies  See telemetry face sheet for immediately available ER MD    Location  [facility]-Cardiac & Pulmonary Rehab    Staff Present  Heath Lark, RN, BSN, CCRP;Maciej Schweitzer [place], MA, RCEP, CCRP, Exercise Physiologist;Mandi Ballard, BS, PEC    Medication changes reported      No    Fall or balance concerns reported     No    Warm-up and Cool-down  Performed on first and last piece of equipment    Resistance Training Performed  Yes    VAD Patient?  No    PAD/SET Patient?  No      Pain Assessment   Currently in Pain?  No/denies          Social History   Tobacco Use  Smoking Status Former Smoker  . Packs/day: 1.00  . Years: 50.00  . Pack years: 50.00  . Types: Cigarettes  . Last attempt to quit: 08/2017  . Years since quitting: 0.2  Smokeless Tobacco Never Used  Tobacco Comment   Has Quit as of May 2019- reviewed relaspe and will continue to followup    Goals Met:  Independence with exercise equipment Exercise tolerated well Personal goals reviewed No report of cardiac concerns or symptoms Strength training completed today  Goals Unmet:  Not Applicable  Comments:  Henry [name] graduated today from  rehab with 36 sessions completed.  Details of the patient's exercise prescription and what He needs to do in order to continue the prescription and progress were discussed with patient.  Patient was given a copy of prescription and goals.  Patient verbalized understanding.  Henry [name] plans to continue to exercise by joining the [employer].    Dr. Emily Filbert is Medical Director for [place] [facility].

## 2017-11-19 DIAGNOSIS — E785 Hyperlipidemia, unspecified: Secondary | ICD-10-CM | POA: Diagnosis not present

## 2017-11-19 DIAGNOSIS — E1129 Type 2 diabetes mellitus with other diabetic kidney complication: Secondary | ICD-10-CM | POA: Diagnosis not present

## 2017-11-19 DIAGNOSIS — I10 Essential (primary) hypertension: Secondary | ICD-10-CM | POA: Diagnosis not present

## 2017-11-19 DIAGNOSIS — J309 Allergic rhinitis, unspecified: Secondary | ICD-10-CM | POA: Diagnosis not present

## 2017-11-19 DIAGNOSIS — I2510 Atherosclerotic heart disease of native coronary artery without angina pectoris: Secondary | ICD-10-CM | POA: Diagnosis not present

## 2017-11-19 DIAGNOSIS — Z794 Long term (current) use of insulin: Secondary | ICD-10-CM | POA: Diagnosis not present

## 2017-12-29 DIAGNOSIS — R197 Diarrhea, unspecified: Secondary | ICD-10-CM | POA: Diagnosis not present

## 2018-01-07 ENCOUNTER — Other Ambulatory Visit: Payer: Self-pay | Admitting: Cardiology

## 2018-02-04 ENCOUNTER — Other Ambulatory Visit: Payer: Self-pay | Admitting: Cardiology

## 2018-02-04 DIAGNOSIS — Z79899 Other long term (current) drug therapy: Secondary | ICD-10-CM | POA: Diagnosis not present

## 2018-02-10 DIAGNOSIS — I2510 Atherosclerotic heart disease of native coronary artery without angina pectoris: Secondary | ICD-10-CM | POA: Diagnosis not present

## 2018-02-10 DIAGNOSIS — I10 Essential (primary) hypertension: Secondary | ICD-10-CM | POA: Diagnosis not present

## 2018-02-10 DIAGNOSIS — I491 Atrial premature depolarization: Secondary | ICD-10-CM | POA: Diagnosis not present

## 2018-02-10 DIAGNOSIS — R002 Palpitations: Secondary | ICD-10-CM | POA: Diagnosis not present

## 2018-02-10 DIAGNOSIS — E785 Hyperlipidemia, unspecified: Secondary | ICD-10-CM | POA: Diagnosis not present

## 2018-03-02 DIAGNOSIS — M79642 Pain in left hand: Secondary | ICD-10-CM | POA: Diagnosis not present

## 2018-03-02 DIAGNOSIS — M79641 Pain in right hand: Secondary | ICD-10-CM | POA: Diagnosis not present

## 2018-03-02 DIAGNOSIS — M79672 Pain in left foot: Secondary | ICD-10-CM | POA: Diagnosis not present

## 2018-03-02 DIAGNOSIS — M79671 Pain in right foot: Secondary | ICD-10-CM | POA: Diagnosis not present

## 2018-03-02 DIAGNOSIS — R200 Anesthesia of skin: Secondary | ICD-10-CM | POA: Diagnosis not present

## 2018-03-07 ENCOUNTER — Other Ambulatory Visit: Payer: Self-pay | Admitting: Cardiology

## 2018-03-11 DIAGNOSIS — H40003 Preglaucoma, unspecified, bilateral: Secondary | ICD-10-CM | POA: Diagnosis not present

## 2018-03-12 DIAGNOSIS — M19071 Primary osteoarthritis, right ankle and foot: Secondary | ICD-10-CM | POA: Diagnosis not present

## 2018-03-12 DIAGNOSIS — M79661 Pain in right lower leg: Secondary | ICD-10-CM | POA: Diagnosis not present

## 2018-03-12 DIAGNOSIS — M7989 Other specified soft tissue disorders: Secondary | ICD-10-CM | POA: Diagnosis not present

## 2018-03-12 DIAGNOSIS — S8011XA Contusion of right lower leg, initial encounter: Secondary | ICD-10-CM | POA: Diagnosis not present

## 2018-03-13 DIAGNOSIS — Z8709 Personal history of other diseases of the respiratory system: Secondary | ICD-10-CM | POA: Diagnosis not present

## 2018-03-13 DIAGNOSIS — Z8639 Personal history of other endocrine, nutritional and metabolic disease: Secondary | ICD-10-CM | POA: Diagnosis not present

## 2018-03-13 DIAGNOSIS — J309 Allergic rhinitis, unspecified: Secondary | ICD-10-CM | POA: Diagnosis not present

## 2018-03-13 DIAGNOSIS — R062 Wheezing: Secondary | ICD-10-CM | POA: Diagnosis not present

## 2018-03-17 DIAGNOSIS — H40003 Preglaucoma, unspecified, bilateral: Secondary | ICD-10-CM | POA: Diagnosis not present

## 2018-04-04 ENCOUNTER — Other Ambulatory Visit: Payer: Self-pay | Admitting: Cardiology

## 2018-04-10 DIAGNOSIS — Z8639 Personal history of other endocrine, nutritional and metabolic disease: Secondary | ICD-10-CM | POA: Diagnosis not present

## 2018-04-10 DIAGNOSIS — K529 Noninfective gastroenteritis and colitis, unspecified: Secondary | ICD-10-CM | POA: Diagnosis not present

## 2018-04-14 DIAGNOSIS — T148XXA Other injury of unspecified body region, initial encounter: Secondary | ICD-10-CM | POA: Diagnosis not present

## 2018-04-14 DIAGNOSIS — L918 Other hypertrophic disorders of the skin: Secondary | ICD-10-CM | POA: Diagnosis not present

## 2018-04-14 DIAGNOSIS — R58 Hemorrhage, not elsewhere classified: Secondary | ICD-10-CM | POA: Diagnosis not present

## 2018-05-04 ENCOUNTER — Other Ambulatory Visit: Payer: Self-pay | Admitting: Cardiology

## 2018-05-13 DIAGNOSIS — I491 Atrial premature depolarization: Secondary | ICD-10-CM | POA: Diagnosis not present

## 2018-05-13 DIAGNOSIS — I2510 Atherosclerotic heart disease of native coronary artery without angina pectoris: Secondary | ICD-10-CM | POA: Diagnosis not present

## 2018-05-13 DIAGNOSIS — J449 Chronic obstructive pulmonary disease, unspecified: Secondary | ICD-10-CM | POA: Diagnosis not present

## 2018-05-13 DIAGNOSIS — I10 Essential (primary) hypertension: Secondary | ICD-10-CM | POA: Diagnosis not present

## 2018-05-13 DIAGNOSIS — R002 Palpitations: Secondary | ICD-10-CM | POA: Diagnosis not present

## 2018-05-19 DIAGNOSIS — C44319 Basal cell carcinoma of skin of other parts of face: Secondary | ICD-10-CM | POA: Diagnosis not present

## 2018-05-19 DIAGNOSIS — L820 Inflamed seborrheic keratosis: Secondary | ICD-10-CM | POA: Diagnosis not present

## 2018-05-19 DIAGNOSIS — C44519 Basal cell carcinoma of skin of other part of trunk: Secondary | ICD-10-CM | POA: Diagnosis not present

## 2018-05-19 DIAGNOSIS — Z85828 Personal history of other malignant neoplasm of skin: Secondary | ICD-10-CM | POA: Diagnosis not present

## 2018-05-19 DIAGNOSIS — D485 Neoplasm of uncertain behavior of skin: Secondary | ICD-10-CM | POA: Diagnosis not present

## 2018-05-19 DIAGNOSIS — Z08 Encounter for follow-up examination after completed treatment for malignant neoplasm: Secondary | ICD-10-CM | POA: Diagnosis not present

## 2018-05-19 DIAGNOSIS — L570 Actinic keratosis: Secondary | ICD-10-CM | POA: Diagnosis not present

## 2018-05-19 DIAGNOSIS — R58 Hemorrhage, not elsewhere classified: Secondary | ICD-10-CM | POA: Diagnosis not present

## 2018-06-02 DIAGNOSIS — I132 Hypertensive heart and chronic kidney disease with heart failure and with stage 5 chronic kidney disease, or end stage renal disease: Secondary | ICD-10-CM | POA: Diagnosis not present

## 2018-06-02 DIAGNOSIS — N401 Enlarged prostate with lower urinary tract symptoms: Secondary | ICD-10-CM | POA: Diagnosis not present

## 2018-06-02 DIAGNOSIS — Z125 Encounter for screening for malignant neoplasm of prostate: Secondary | ICD-10-CM | POA: Diagnosis not present

## 2018-06-02 DIAGNOSIS — N200 Calculus of kidney: Secondary | ICD-10-CM | POA: Diagnosis not present

## 2018-06-03 DIAGNOSIS — Z794 Long term (current) use of insulin: Secondary | ICD-10-CM | POA: Diagnosis not present

## 2018-06-03 DIAGNOSIS — R0602 Shortness of breath: Secondary | ICD-10-CM | POA: Diagnosis not present

## 2018-06-03 DIAGNOSIS — E1129 Type 2 diabetes mellitus with other diabetic kidney complication: Secondary | ICD-10-CM | POA: Diagnosis not present

## 2018-06-03 DIAGNOSIS — I2510 Atherosclerotic heart disease of native coronary artery without angina pectoris: Secondary | ICD-10-CM | POA: Diagnosis not present

## 2018-06-03 DIAGNOSIS — J449 Chronic obstructive pulmonary disease, unspecified: Secondary | ICD-10-CM | POA: Diagnosis not present

## 2018-06-05 ENCOUNTER — Other Ambulatory Visit: Payer: Self-pay | Admitting: Cardiology

## 2018-06-15 DIAGNOSIS — C44319 Basal cell carcinoma of skin of other parts of face: Secondary | ICD-10-CM | POA: Diagnosis not present

## 2018-06-15 DIAGNOSIS — D485 Neoplasm of uncertain behavior of skin: Secondary | ICD-10-CM | POA: Diagnosis not present

## 2018-06-15 DIAGNOSIS — L98499 Non-pressure chronic ulcer of skin of other sites with unspecified severity: Secondary | ICD-10-CM | POA: Diagnosis not present

## 2018-06-22 DIAGNOSIS — C44519 Basal cell carcinoma of skin of other part of trunk: Secondary | ICD-10-CM | POA: Diagnosis not present

## 2018-07-06 DIAGNOSIS — M79671 Pain in right foot: Secondary | ICD-10-CM | POA: Diagnosis not present

## 2018-07-06 DIAGNOSIS — M79672 Pain in left foot: Secondary | ICD-10-CM | POA: Diagnosis not present

## 2018-07-06 DIAGNOSIS — M7751 Other enthesopathy of right foot: Secondary | ICD-10-CM | POA: Diagnosis not present

## 2018-07-06 DIAGNOSIS — M7752 Other enthesopathy of left foot: Secondary | ICD-10-CM | POA: Diagnosis not present

## 2018-07-27 DIAGNOSIS — E1142 Type 2 diabetes mellitus with diabetic polyneuropathy: Secondary | ICD-10-CM | POA: Diagnosis not present

## 2018-07-27 DIAGNOSIS — M79672 Pain in left foot: Secondary | ICD-10-CM | POA: Diagnosis not present

## 2018-08-11 DIAGNOSIS — I491 Atrial premature depolarization: Secondary | ICD-10-CM | POA: Diagnosis not present

## 2018-08-11 DIAGNOSIS — I10 Essential (primary) hypertension: Secondary | ICD-10-CM | POA: Diagnosis not present

## 2018-08-11 DIAGNOSIS — I2510 Atherosclerotic heart disease of native coronary artery without angina pectoris: Secondary | ICD-10-CM | POA: Diagnosis not present

## 2018-08-11 DIAGNOSIS — J449 Chronic obstructive pulmonary disease, unspecified: Secondary | ICD-10-CM | POA: Diagnosis not present

## 2018-08-11 DIAGNOSIS — Z794 Long term (current) use of insulin: Secondary | ICD-10-CM | POA: Diagnosis not present

## 2018-08-11 DIAGNOSIS — R002 Palpitations: Secondary | ICD-10-CM | POA: Diagnosis not present

## 2018-08-11 DIAGNOSIS — E785 Hyperlipidemia, unspecified: Secondary | ICD-10-CM | POA: Diagnosis not present

## 2018-08-11 DIAGNOSIS — E1129 Type 2 diabetes mellitus with other diabetic kidney complication: Secondary | ICD-10-CM | POA: Diagnosis not present

## 2018-08-17 DIAGNOSIS — M79671 Pain in right foot: Secondary | ICD-10-CM | POA: Diagnosis not present

## 2018-08-17 DIAGNOSIS — M79672 Pain in left foot: Secondary | ICD-10-CM | POA: Diagnosis not present

## 2018-08-17 DIAGNOSIS — E1142 Type 2 diabetes mellitus with diabetic polyneuropathy: Secondary | ICD-10-CM | POA: Diagnosis not present

## 2018-09-01 DIAGNOSIS — M79642 Pain in left hand: Secondary | ICD-10-CM | POA: Diagnosis not present

## 2018-09-01 DIAGNOSIS — R202 Paresthesia of skin: Secondary | ICD-10-CM | POA: Diagnosis not present

## 2018-09-01 DIAGNOSIS — M79641 Pain in right hand: Secondary | ICD-10-CM | POA: Diagnosis not present

## 2018-09-01 DIAGNOSIS — R200 Anesthesia of skin: Secondary | ICD-10-CM | POA: Diagnosis not present

## 2018-09-23 DIAGNOSIS — M25572 Pain in left ankle and joints of left foot: Secondary | ICD-10-CM | POA: Diagnosis not present

## 2018-09-23 DIAGNOSIS — M25571 Pain in right ankle and joints of right foot: Secondary | ICD-10-CM | POA: Diagnosis not present

## 2018-09-28 DIAGNOSIS — H40003 Preglaucoma, unspecified, bilateral: Secondary | ICD-10-CM | POA: Diagnosis not present

## 2018-10-12 DIAGNOSIS — R5381 Other malaise: Secondary | ICD-10-CM | POA: Diagnosis not present

## 2018-11-14 ENCOUNTER — Other Ambulatory Visit: Payer: Self-pay

## 2018-11-14 ENCOUNTER — Emergency Department: Payer: Medicare HMO

## 2018-11-14 ENCOUNTER — Emergency Department
Admission: EM | Admit: 2018-11-14 | Discharge: 2018-11-14 | Disposition: A | Discharge status: Home / Self Care | Payer: Medicare HMO | Attending: Emergency Medicine | Admitting: Emergency Medicine

## 2018-11-14 DIAGNOSIS — I2510 Atherosclerotic heart disease of native coronary artery without angina pectoris: Secondary | ICD-10-CM | POA: Insufficient documentation

## 2018-11-14 DIAGNOSIS — R079 Chest pain, unspecified: Secondary | ICD-10-CM

## 2018-11-14 DIAGNOSIS — Z79899 Other long term (current) drug therapy: Secondary | ICD-10-CM | POA: Insufficient documentation

## 2018-11-14 DIAGNOSIS — R0789 Other chest pain: Secondary | ICD-10-CM | POA: Diagnosis not present

## 2018-11-14 DIAGNOSIS — Z7982 Long term (current) use of aspirin: Secondary | ICD-10-CM | POA: Diagnosis not present

## 2018-11-14 DIAGNOSIS — Z87891 Personal history of nicotine dependence: Secondary | ICD-10-CM | POA: Diagnosis not present

## 2018-11-14 DIAGNOSIS — Z951 Presence of aortocoronary bypass graft: Secondary | ICD-10-CM | POA: Diagnosis not present

## 2018-11-14 DIAGNOSIS — N182 Chronic kidney disease, stage 2 (mild): Secondary | ICD-10-CM | POA: Insufficient documentation

## 2018-11-14 DIAGNOSIS — E1122 Type 2 diabetes mellitus with diabetic chronic kidney disease: Secondary | ICD-10-CM | POA: Diagnosis not present

## 2018-11-14 DIAGNOSIS — E785 Hyperlipidemia, unspecified: Secondary | ICD-10-CM | POA: Insufficient documentation

## 2018-11-14 DIAGNOSIS — Z794 Long term (current) use of insulin: Secondary | ICD-10-CM | POA: Diagnosis not present

## 2018-11-14 DIAGNOSIS — I129 Hypertensive chronic kidney disease with stage 1 through stage 4 chronic kidney disease, or unspecified chronic kidney disease: Secondary | ICD-10-CM | POA: Insufficient documentation

## 2018-11-14 DIAGNOSIS — R0602 Shortness of breath: Secondary | ICD-10-CM | POA: Insufficient documentation

## 2018-11-14 DIAGNOSIS — I252 Old myocardial infarction: Secondary | ICD-10-CM | POA: Diagnosis not present

## 2018-11-14 LAB — COMPREHENSIVE METABOLIC PANEL
ALT: 24 U/L (ref 0–44)
AST: 27 U/L (ref 15–41)
Albumin: 4.5 g/dL (ref 3.5–5.0)
Alkaline Phosphatase: 57 U/L (ref 38–126)
Anion gap: 12 (ref 5–15)
BUN: 19 mg/dL (ref 8–23)
CO2: 23 mmol/L (ref 22–32)
Calcium: 10.3 mg/dL (ref 8.9–10.3)
Chloride: 101 mmol/L (ref 98–111)
Creatinine, Ser: 1.08 mg/dL (ref 0.61–1.24)
GFR calc Af Amer: >60 mL/min (ref >60)
GFR calc non Af Amer: >60 mL/min (ref >60)
Glucose, Bld: 140 mg/dL — ABNORMAL HIGH (ref 70–99)
Potassium: 4.1 mmol/L (ref 3.5–5.1)
Sodium: 136 mmol/L (ref 135–145)
Total Bilirubin: 0.5 mg/dL (ref 0.3–1.2)
Total Protein: 7.8 g/dL (ref 6.5–8.1)

## 2018-11-14 LAB — CBC WITH DIFFERENTIAL/PLATELET
Abs Immature Granulocytes: 0.03 10*3/uL (ref 0.00–0.07)
Basophils Absolute: 0.1 10*3/uL (ref 0.0–0.1)
Basophils Relative: 1 %
Eosinophils Absolute: 0.2 10*3/uL (ref 0.0–0.5)
Eosinophils Relative: 2 %
HCT: 44.3 % (ref 39.0–52.0)
Hemoglobin: 14.9 g/dL (ref 13.0–17.0)
Immature Granulocytes: 0 %
Lymphocytes Relative: 19 %
Lymphs Abs: 2 10*3/uL (ref 0.7–4.0)
MCH: 29.4 pg (ref 26.0–34.0)
MCHC: 33.6 g/dL (ref 30.0–36.0)
MCV: 87.4 fL (ref 80.0–100.0)
Monocytes Absolute: 0.9 10*3/uL (ref 0.1–1.0)
Monocytes Relative: 9 %
Neutro Abs: 7.1 10*3/uL (ref 1.7–7.7)
Neutrophils Relative %: 69 %
Platelets: 317 10*3/uL (ref 150–400)
RBC: 5.07 MIL/uL (ref 4.22–5.81)
RDW: 13.4 % (ref 11.5–15.5)
WBC: 10.4 10*3/uL (ref 4.0–10.5)
nRBC: 0 % (ref 0.0–0.2)

## 2018-11-14 LAB — GLUCOSE, CAPILLARY: Glucose-Capillary: 121 mg/dL — ABNORMAL HIGH (ref 70–99)

## 2018-11-14 LAB — BRAIN NATRIURETIC PEPTIDE: B Natriuretic Peptide: 26 pg/mL (ref 0.0–100.0)

## 2018-11-14 LAB — TROPONIN I (HIGH SENSITIVITY)
Troponin I (High Sensitivity): 5 ng/L (ref <18)
Troponin I (High Sensitivity): 4 ng/L (ref <18)

## 2018-11-14 NOTE — ED Triage Notes (Signed)
pt c/o chest congestion with sharp pains that radiate across chest, dizziness and feeling of passing out, [facility] sent for eval and stated that CXR shows fluid on the lungs

## 2018-11-14 NOTE — ED Provider Notes (Signed)
[facility] Emergency Department Provider Note  Time seen: 8:55 PM  I have reviewed the triage vital signs and the nursing notes.   HISTORY  Chief Complaint Chest Pain and Shortness of Breath   HPI Henry [name] is a 73 y.o. male with a past medical history of CAD, CKD, hypertension, hyperlipidemia, angioplasty May 2019 who presents to the emergency department for chest discomfort.  According to the patient since his stent last year he has intermittently been experiencing a sensation of "congestion" in the chest.  States it was somewhat worse today which he describes more as a discomfort or tightness on both sides of his chest.  Patient was concerned it could be cardiac in nature so he went to urgent care for evaluation.  They did an x-ray and told him that it looks like he could have "fluid on his lungs."  And they referred him to the emergency department for further evaluation.  Here the patient appears well, no acute distress.  Denies any chest discomfort at this time.  Denies any fever or shortness of breath.  Does state occasional cough but had a long smoking history recently quit in 2019.   Past Medical History:  Diagnosis Date  . Arthritis   . CAD (coronary artery disease)    a. anterior stemi 08/10/2017; b. LHC 08/10/17: pLAD 70, p-mLAD 99 thrombotic, ostD1 70, p-mLCx 30, pRCA-1 50 calcified, pRCA-2 99 calcified, dRCA 30. Successful PCI/DES to p-mLAD x 2  . Cancer ([place])    skin  . CKD (chronic kidney disease), stage II   . Glaucoma   . Hyperlipidemia   . Hypertension   . Insulin dependent diabetes mellitus ([place])   . Ischemic cardiomyopathy    a. TTE 08/10/17: technically difficult study, no diagnostic RWMA, EF 40-50%, poor windows, very difficult study   . S/P angioplasty with stent 08/10/17 emergently to LAD ,  08/13/17 atherectomy and stent to pRCA  08/14/2017    Patient Active Problem List   Diagnosis Date Noted  . S/P angioplasty with stent 08/10/17  emergently to LAD ,  08/13/17 atherectomy and stent to [facility]  08/14/2017  . CAD in native artery 08/12/2017  . Leukocytosis 08/12/2017  . Insulin dependent diabetes mellitus ([place]) 08/12/2017  . NSTEMI (non-ST elevated myocardial infarction) ([place]) 08/12/2017  . Acute ST elevation myocardial infarction (STEMI) involving left anterior descending (LAD) coronary artery ([place]) 08/10/2017  . ST elevation myocardial infarction involving left anterior descending (LAD) coronary artery ([place])   . CAP (community acquired pneumonia) 01/25/2016  . Diabetes ([place]) 01/25/2016  . HTN (hypertension) 01/25/2016  . HLD (hyperlipidemia) 01/25/2016    Past Surgical History:  Procedure Laterality Date  . COLONOSCOPY WITH PROPOFOL N/A 12/08/2014   Procedure: COLONOSCOPY WITH PROPOFOL;  Surgeon: Manya Silvas, MD;  Location: [facility] ENDOSCOPY;  Service: Endoscopy;  Laterality: N/A;  . COLONOSCOPY WITH PROPOFOL N/A 01/10/2016   Procedure: COLONOSCOPY WITH PROPOFOL;  Surgeon: Manya Silvas, MD;  Location: [facility] ENDOSCOPY;  Service: Endoscopy;  Laterality: N/A;  . CORONARY ATHERECTOMY N/A 08/13/2017   Procedure: CORONARY ATHERECTOMY;  Surgeon: Wellington Hampshire, MD;  Location: [place] CV LAB;  Service: Cardiovascular;  Laterality: N/A;  . CORONARY STENT INTERVENTION N/A 08/13/2017   Procedure: CORONARY STENT INTERVENTION;  Surgeon: Wellington Hampshire, MD;  Location: [place] CV LAB;  Service: Cardiovascular;  Laterality: N/A;  . CORONARY/GRAFT ACUTE MI REVASCULARIZATION N/A 08/10/2017   Procedure: Coronary/Graft Acute MI Revascularization;  Surgeon: Burnell Blanks, MD;  Location: [place] CV LAB;  Service: Cardiovascular;  Laterality: N/A;  . EXTRACORPOREAL SHOCK WAVE LITHOTRIPSY Right 11/09/2015   Procedure: EXTRACORPOREAL SHOCK WAVE LITHOTRIPSY (ESWL);  Surgeon: Royston Cowper, MD;  Location: [facility] ORS;  Service: Urology;  Laterality: Right;  . EXTRACORPOREAL SHOCK WAVE LITHOTRIPSY Left 10/17/2016   Procedure:  EXTRACORPOREAL SHOCK WAVE LITHOTRIPSY (ESWL);  Surgeon: Royston Cowper, MD;  Location: [facility] ORS;  Service: Urology;  Laterality: Left;  . LEFT HEART CATH AND CORONARY ANGIOGRAPHY N/A 08/10/2017   Procedure: LEFT HEART CATH AND CORONARY ANGIOGRAPHY;  Surgeon: Burnell Blanks, MD;  Location: [place] CV LAB;  Service: Cardiovascular;  Laterality: N/A;  . LITHOTRIPSY     x 6  . PAROTIDECTOMY  2015  . TEMPORARY PACEMAKER N/A 08/13/2017   Procedure: TEMPORARY PACEMAKER;  Surgeon: Wellington Hampshire, MD;  Location: [place] CV LAB;  Service: Cardiovascular;  Laterality: N/A;  . TONSILLECTOMY      Prior to Admission medications   Medication Sig Start Date End Date Taking? Authorizing Provider  acetaminophen (TYLENOL) 325 MG tablet Take 2 tablets (650 mg total) by mouth every 6 (six) hours as needed for mild pain (or Fever >/= 101). Patient not taking: Reported on 08/20/2017 01/27/16   Nicholes Mango, MD  albuterol (PROVENTIL HFA;VENTOLIN HFA) 108 (90 Base) MCG/ACT inhaler Inhale 2 puffs into the lungs every 6 (six) hours as needed for wheezing or shortness of breath. 01/27/16   Nicholes Mango, MD  aspirin EC 81 MG EC tablet Take 1 tablet (81 mg total) by mouth daily. 08/15/17   Isaiah Serge, NP  atorvastatin (LIPITOR) 80 MG tablet Take 1 tablet (80 mg total) by mouth daily at 6 PM. 08/14/17   Isaiah Serge, NP  cetirizine (ZYRTEC) 10 MG tablet Take 10 mg by mouth daily.    [provider]  fluticasone (FLONASE) 50 MCG/ACT nasal spray Place 1 spray into both nostrils daily.    [provider]  HYDROcodone-acetaminophen (NORCO/VICODIN) 5-325 MG tablet Take 1 tablet by mouth every 6 (six) hours as needed. Pt to take twice a day as needed.    [provider]  insulin detemir (LEVEMIR) 100 UNIT/ML injection Inject 0.5 mLs (50 Units total) into the skin 2 (two) times daily. 08/12/17   Gladstone Lighter, MD  irbesartan (AVAPRO) 300 MG tablet TAKE ONE TABLET EVERY DAY  06/05/18   Isaiah Serge, NP  loratadine (CLARITIN) 10 MG tablet Take 1 tablet (10 mg total) by mouth daily as needed for allergies. Patient not taking: Reported on 08/20/2017 08/12/17   Gladstone Lighter, MD  metoprolol tartrate (LOPRESSOR) 25 MG tablet Take 1 tablet (25 mg total) by mouth 2 (two) times daily. 08/14/17   Isaiah Serge, NP  montelukast (SINGULAIR) 10 MG tablet Take 1 tablet by mouth daily. 07/10/17   [provider]  multivitamin-iron-minerals-folic acid (CENTRUM) chewable tablet Chew 1 tablet by mouth daily.    [provider]  nicotine (NICODERM CQ - DOSED IN MG/24 HOURS) 14 mg/24hr patch Place 1 patch (14 mg total) onto the skin daily. Patient taking differently: Place 7 mg onto the skin daily.  08/14/17   Isaiah Serge, NP  nitroGLYCERIN (NITROSTAT) 0.4 MG SL tablet Place 1 tablet (0.4 mg total) under the tongue every 5 (five) minutes as needed for chest pain. Patient not taking: Reported on 09/23/2017 08/14/17   Isaiah Serge, NP  sitaGLIPtin-metformin (JANUMET) 50-1000 MG tablet Take 1 tablet by mouth 2 (two) times daily  with a meal.    [provider]  ticagrelor (BRILINTA) 90 MG TABS tablet Take 1 tablet (90 mg total) by mouth 2 (two) times daily. 08/14/17   Isaiah Serge, NP    No Known Allergies  Family History  Problem Relation Age of Onset  . Heart attack Mother   . Melanoma Father   . Parkinsonism Father     Social History Social History   Tobacco Use  . Smoking status: Former Smoker    Packs/day: 1.00    Years: 50.00    Pack years: 50.00    Types: Cigarettes    Quit date: 08/2017    Years since quitting: 1.2  . Smokeless tobacco: Never Used  . Tobacco comment: Has Quit as of May 2019- reviewed relaspe and will continue to followup  Substance Use Topics  . Alcohol use: No  . Drug use: No    Review of Systems Constitutional: Negative for fever. Cardiovascular: Positive for intermittent chest congestion/tightness per  patient.  None currently. Respiratory: Negative for shortness of breath.  Negative for cough. Gastrointestinal: Negative for abdominal pain, vomiting  Musculoskeletal: Negative for leg pain or swelling. Skin: Negative for skin complaints  Neurological: Negative for headache All other ROS negative  ____________________________________________   PHYSICAL EXAM:  VITAL SIGNS: ED Triage Vitals  Enc Vitals Group     BP 11/14/18 1748 137/79     Pulse Rate 11/14/18 1748 85     Resp 11/14/18 1748 18     Temp 11/14/18 1748 98.8 F (37.1 C)     Temp Source 11/14/18 1748 Oral     SpO2 11/14/18 1748 95 %     Weight 11/14/18 1657 210 lb (95.3 kg)     Height 11/14/18 1657 6' (1.829 m)     Head Circumference --      Peak Flow --      Pain Score 11/14/18 1656 6     Pain Loc --      Pain Edu? --      Excl. in [place]? --    Constitutional: Alert and oriented. Well appearing and in no distress. Eyes: Normal exam ENT      Head: Normocephalic and atraumatic.      Mouth/Throat: Mucous membranes are moist. Cardiovascular: Normal rate, regular rhythm. No murmur Respiratory: Normal respiratory effort without tachypnea nor retractions. Breath sounds are clear Gastrointestinal: Soft and nontender. No distention. Musculoskeletal: Nontender with normal range of motion in all extremities. No lower extremity tenderness or edema. Neurologic:  Normal speech and language. No gross focal neurologic deficits Skin:  Skin is warm, dry and intact.  Psychiatric: Mood and affect are normal.   ____________________________________________    EKG  EKG viewed and interpreted by myself shows a normal sinus rhythm at 88 bpm with a narrow QRS, normal axis, normal intervals, nonspecific ST changes.  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  ____________________________________________   INITIAL IMPRESSION / ASSESSMENT AND PLAN / ED  COURSE  Pertinent labs & imaging results that were available during my care of the patient were reviewed by me and considered in my medical decision making (see chart for details).   Patient presents emergency department for intermittent chest congestion/tightness somewhat worse today.  Overall the patient appears very well, no distress.  Denies any symptoms at this time.  Reassuring physical exam.  Differential this time would include ACS, angina, chest wall discomfort, pneumonia, pneumothorax, COPD.  Patient's x-ray is reassuring,  EKG shows nonspecific findings.  Labs including cardiac enzymes are negative.  We will repeat a troponin as a precaution.  If the patient's repeat troponin remains negative anticipate likely discharge home.  Patient agreeable to plan of care.  Repeat troponin remains negative.  Patient continues to appear well.  We will discharge home with PCP follow-up.  Patient agreeable to plan of care.  Henry [name] was evaluated in Emergency Department on 11/14/2018 for the symptoms described in the history of present illness. He was evaluated in the context of the global COVID-19 pandemic, which necessitated consideration that the patient might be at risk for infection with the SARS-CoV-2 virus that causes COVID-19. Institutional protocols and algorithms that pertain to the evaluation of patients at risk for COVID-19 are in a state of rapid change based on information released by regulatory bodies including the CDC and federal and state organizations. These policies and algorithms were followed during the patient's care in the ED.  ____________________________________________   FINAL CLINICAL IMPRESSION(S) / ED DIAGNOSES  Chest pain   Harvest Dark, MD 11/14/18 2148

## 2018-11-25 DIAGNOSIS — I491 Atrial premature depolarization: Secondary | ICD-10-CM | POA: Diagnosis not present

## 2018-11-25 DIAGNOSIS — I10 Essential (primary) hypertension: Secondary | ICD-10-CM | POA: Diagnosis not present

## 2018-11-25 DIAGNOSIS — I2510 Atherosclerotic heart disease of native coronary artery without angina pectoris: Secondary | ICD-10-CM | POA: Diagnosis not present

## 2018-11-25 DIAGNOSIS — E785 Hyperlipidemia, unspecified: Secondary | ICD-10-CM | POA: Diagnosis not present

## 2018-11-30 DIAGNOSIS — R0981 Nasal congestion: Secondary | ICD-10-CM | POA: Diagnosis not present

## 2018-11-30 DIAGNOSIS — R0989 Other specified symptoms and signs involving the circulatory and respiratory systems: Secondary | ICD-10-CM | POA: Diagnosis not present

## 2018-12-07 DIAGNOSIS — E1142 Type 2 diabetes mellitus with diabetic polyneuropathy: Secondary | ICD-10-CM | POA: Diagnosis not present

## 2018-12-07 DIAGNOSIS — M79672 Pain in left foot: Secondary | ICD-10-CM | POA: Diagnosis not present

## 2018-12-07 DIAGNOSIS — M79671 Pain in right foot: Secondary | ICD-10-CM | POA: Diagnosis not present

## 2018-12-07 DIAGNOSIS — E1349 Other specified diabetes mellitus with other diabetic neurological complication: Secondary | ICD-10-CM | POA: Diagnosis not present

## 2018-12-22 DIAGNOSIS — Z85828 Personal history of other malignant neoplasm of skin: Secondary | ICD-10-CM | POA: Diagnosis not present

## 2018-12-22 DIAGNOSIS — C44519 Basal cell carcinoma of skin of other part of trunk: Secondary | ICD-10-CM | POA: Diagnosis not present

## 2018-12-22 DIAGNOSIS — D2271 Melanocytic nevi of right lower limb, including hip: Secondary | ICD-10-CM | POA: Diagnosis not present

## 2018-12-22 DIAGNOSIS — D485 Neoplasm of uncertain behavior of skin: Secondary | ICD-10-CM | POA: Diagnosis not present

## 2018-12-22 DIAGNOSIS — D2272 Melanocytic nevi of left lower limb, including hip: Secondary | ICD-10-CM | POA: Diagnosis not present

## 2018-12-22 DIAGNOSIS — D2261 Melanocytic nevi of right upper limb, including shoulder: Secondary | ICD-10-CM | POA: Diagnosis not present

## 2018-12-22 DIAGNOSIS — D2262 Melanocytic nevi of left upper limb, including shoulder: Secondary | ICD-10-CM | POA: Diagnosis not present

## 2018-12-22 DIAGNOSIS — Z08 Encounter for follow-up examination after completed treatment for malignant neoplasm: Secondary | ICD-10-CM | POA: Diagnosis not present

## 2018-12-22 DIAGNOSIS — L821 Other seborrheic keratosis: Secondary | ICD-10-CM | POA: Diagnosis not present

## 2018-12-22 DIAGNOSIS — D225 Melanocytic nevi of trunk: Secondary | ICD-10-CM | POA: Diagnosis not present

## 2018-12-26 ENCOUNTER — Emergency Department
Admission: EM | Admit: 2018-12-26 | Discharge: 2018-12-26 | Disposition: A | Discharge status: Home / Self Care | Payer: Medicare HMO | Attending: Emergency Medicine | Admitting: Emergency Medicine

## 2018-12-26 ENCOUNTER — Other Ambulatory Visit: Payer: Self-pay

## 2018-12-26 ENCOUNTER — Encounter: Payer: Self-pay | Admitting: Emergency Medicine

## 2018-12-26 DIAGNOSIS — Y939 Activity, unspecified: Secondary | ICD-10-CM | POA: Diagnosis not present

## 2018-12-26 DIAGNOSIS — E119 Type 2 diabetes mellitus without complications: Secondary | ICD-10-CM | POA: Insufficient documentation

## 2018-12-26 DIAGNOSIS — I2510 Atherosclerotic heart disease of native coronary artery without angina pectoris: Secondary | ICD-10-CM | POA: Insufficient documentation

## 2018-12-26 DIAGNOSIS — W5503XA Scratched by cat, initial encounter: Secondary | ICD-10-CM | POA: Insufficient documentation

## 2018-12-26 DIAGNOSIS — S61411A Laceration without foreign body of right hand, initial encounter: Secondary | ICD-10-CM

## 2018-12-26 DIAGNOSIS — S60511A Abrasion of right hand, initial encounter: Secondary | ICD-10-CM

## 2018-12-26 DIAGNOSIS — Z87891 Personal history of nicotine dependence: Secondary | ICD-10-CM | POA: Diagnosis not present

## 2018-12-26 DIAGNOSIS — Z79899 Other long term (current) drug therapy: Secondary | ICD-10-CM | POA: Insufficient documentation

## 2018-12-26 DIAGNOSIS — Y929 Unspecified place or not applicable: Secondary | ICD-10-CM | POA: Diagnosis not present

## 2018-12-26 DIAGNOSIS — Z7901 Long term (current) use of anticoagulants: Secondary | ICD-10-CM | POA: Insufficient documentation

## 2018-12-26 DIAGNOSIS — I129 Hypertensive chronic kidney disease with stage 1 through stage 4 chronic kidney disease, or unspecified chronic kidney disease: Secondary | ICD-10-CM | POA: Diagnosis not present

## 2018-12-26 DIAGNOSIS — Z7982 Long term (current) use of aspirin: Secondary | ICD-10-CM | POA: Diagnosis not present

## 2018-12-26 DIAGNOSIS — S6991XA Unspecified injury of right wrist, hand and finger(s), initial encounter: Secondary | ICD-10-CM | POA: Diagnosis present

## 2018-12-26 DIAGNOSIS — Y999 Unspecified external cause status: Secondary | ICD-10-CM | POA: Insufficient documentation

## 2018-12-26 DIAGNOSIS — N183 Chronic kidney disease, stage 3 (moderate): Secondary | ICD-10-CM | POA: Diagnosis not present

## 2018-12-26 MED ORDER — TRANEXAMIC ACID 1000 MG/10ML IV SOLN
500.0000 mg | Freq: Once | INTRAVENOUS | Status: DC
  Filled 2018-12-26: qty 10

## 2018-12-26 MED ORDER — AMOXICILLIN-POT CLAVULANATE 875-125 MG PO TABS: 1.0000 | ORAL_TABLET | Freq: Two times a day (BID) | ORAL | 0 refills | Status: DC

## 2018-12-26 MED ORDER — ONDANSETRON 4 MG PO TBDP: 4.0000 mg | ORAL_TABLET | Freq: Three times a day (TID) | ORAL | 0 refills | Status: DC | PRN

## 2018-12-26 NOTE — ED Provider Notes (Signed)
[facility] Emergency Department Provider Note  ____________________________________________  Time seen: Approximately 9:06 AM  I have reviewed the triage vital signs and the nursing notes.   HISTORY  Chief Complaint Cat Scratch    HPI Henry [name] is a 73 y.o. male with a history of CAD CKD hypertension diabetes on insulin who comes to the ED complaining of pain and bleeding from the right hand after being scratched by his neighbor's cat yesterday.  He reports knowing the cat very well, interacts with it frequently and is usually friendly.  Yesterday he was petting its paws when it scratched him on the hand.  He is on Brilinta, and it has continued bleeding since then.  He denies any chest pain shortness of breath fevers chills sweats or dizziness.  He otherwise feels fine.  Last tetanus shot was 1 to 2 years ago.  The cat has regular vet care and is up-to-date with vaccinations.      Past Medical History:  Diagnosis Date  . Arthritis   . CAD (coronary artery disease)    a. anterior stemi 08/10/2017; b. LHC 08/10/17: pLAD 70, p-mLAD 99 thrombotic, ostD1 70, p-mLCx 30, pRCA-1 50 calcified, pRCA-2 99 calcified, dRCA 30. Successful PCI/DES to p-mLAD x 2  . Cancer ([place])    skin  . CKD (chronic kidney disease), stage II   . Glaucoma   . Hyperlipidemia   . Hypertension   . Insulin dependent diabetes mellitus ([place])   . Ischemic cardiomyopathy    a. TTE 08/10/17: technically difficult study, no diagnostic RWMA, EF 40-50%, poor windows, very difficult study   . S/P angioplasty with stent 08/10/17 emergently to LAD ,  08/13/17 atherectomy and stent to pRCA  08/14/2017     Patient Active Problem List   Diagnosis Date Noted  . S/P angioplasty with stent 08/10/17 emergently to LAD ,  08/13/17 atherectomy and stent to [facility]  08/14/2017  . CAD in native artery 08/12/2017  . Leukocytosis 08/12/2017  . Insulin dependent diabetes mellitus ([place]) 08/12/2017  . NSTEMI (non-ST  elevated myocardial infarction) ([place]) 08/12/2017  . Acute ST elevation myocardial infarction (STEMI) involving left anterior descending (LAD) coronary artery ([place]) 08/10/2017  . ST elevation myocardial infarction involving left anterior descending (LAD) coronary artery ([place])   . CAP (community acquired pneumonia) 01/25/2016  . Diabetes ([place]) 01/25/2016  . HTN (hypertension) 01/25/2016  . HLD (hyperlipidemia) 01/25/2016     Past Surgical History:  Procedure Laterality Date  . COLONOSCOPY WITH PROPOFOL N/A 12/08/2014   Procedure: COLONOSCOPY WITH PROPOFOL;  Surgeon: Manya Silvas, MD;  Location: [facility] ENDOSCOPY;  Service: Endoscopy;  Laterality: N/A;  . COLONOSCOPY WITH PROPOFOL N/A 01/10/2016   Procedure: COLONOSCOPY WITH PROPOFOL;  Surgeon: Manya Silvas, MD;  Location: [facility] ENDOSCOPY;  Service: Endoscopy;  Laterality: N/A;  . CORONARY ATHERECTOMY N/A 08/13/2017   Procedure: CORONARY ATHERECTOMY;  Surgeon: Wellington Hampshire, MD;  Location: [place] CV LAB;  Service: Cardiovascular;  Laterality: N/A;  . CORONARY STENT INTERVENTION N/A 08/13/2017   Procedure: CORONARY STENT INTERVENTION;  Surgeon: Wellington Hampshire, MD;  Location: [place] CV LAB;  Service: Cardiovascular;  Laterality: N/A;  . CORONARY/GRAFT ACUTE MI REVASCULARIZATION N/A 08/10/2017   Procedure: Coronary/Graft Acute MI Revascularization;  Surgeon: Burnell Blanks, MD;  Location: [place] CV LAB;  Service: Cardiovascular;  Laterality: N/A;  . EXTRACORPOREAL SHOCK WAVE LITHOTRIPSY Right 11/09/2015   Procedure: EXTRACORPOREAL SHOCK WAVE LITHOTRIPSY (ESWL);  Surgeon: Royston Cowper, MD;  Location:  [facility] ORS;  Service: Urology;  Laterality: Right;  . EXTRACORPOREAL SHOCK WAVE LITHOTRIPSY Left 10/17/2016   Procedure: EXTRACORPOREAL SHOCK WAVE LITHOTRIPSY (ESWL);  Surgeon: Royston Cowper, MD;  Location: [facility] ORS;  Service: Urology;  Laterality: Left;  . LEFT HEART CATH AND CORONARY ANGIOGRAPHY N/A 08/10/2017    Procedure: LEFT HEART CATH AND CORONARY ANGIOGRAPHY;  Surgeon: Burnell Blanks, MD;  Location: [place] CV LAB;  Service: Cardiovascular;  Laterality: N/A;  . LITHOTRIPSY     x 6  . PAROTIDECTOMY  2015  . TEMPORARY PACEMAKER N/A 08/13/2017   Procedure: TEMPORARY PACEMAKER;  Surgeon: Wellington Hampshire, MD;  Location: [place] CV LAB;  Service: Cardiovascular;  Laterality: N/A;  . TONSILLECTOMY       Prior to Admission medications   Medication Sig Start Date End Date Taking? Authorizing Provider  acetaminophen (TYLENOL) 325 MG tablet Take 2 tablets (650 mg total) by mouth every 6 (six) hours as needed for mild pain (or Fever >/= 101). Patient not taking: Reported on 08/20/2017 01/27/16   Nicholes Mango, MD  albuterol (PROVENTIL HFA;VENTOLIN HFA) 108 (90 Base) MCG/ACT inhaler Inhale 2 puffs into the lungs every 6 (six) hours as needed for wheezing or shortness of breath. 01/27/16   Nicholes Mango, MD  amoxicillin-clavulanate (AUGMENTIN) 875-125 MG tablet Take 1 tablet by mouth 2 (two) times daily. 12/26/18   Carrie Mew, MD  aspirin EC 81 MG EC tablet Take 1 tablet (81 mg total) by mouth daily. 08/15/17   Isaiah Serge, NP  atorvastatin (LIPITOR) 80 MG tablet Take 1 tablet (80 mg total) by mouth daily at 6 PM. 08/14/17   Isaiah Serge, NP  cetirizine (ZYRTEC) 10 MG tablet Take 10 mg by mouth daily.    [provider]  fluticasone (FLONASE) 50 MCG/ACT nasal spray Place 1 spray into both nostrils daily.    [provider]  HYDROcodone-acetaminophen (NORCO/VICODIN) 5-325 MG tablet Take 1 tablet by mouth every 6 (six) hours as needed. Pt to take twice a day as needed.    [provider]  insulin detemir (LEVEMIR) 100 UNIT/ML injection Inject 0.5 mLs (50 Units total) into the skin 2 (two) times daily. 08/12/17   Gladstone Lighter, MD  irbesartan (AVAPRO) 300 MG tablet TAKE ONE TABLET EVERY DAY 06/05/18   Isaiah Serge, NP  loratadine (CLARITIN) 10 MG tablet  Take 1 tablet (10 mg total) by mouth daily as needed for allergies. Patient not taking: Reported on 08/20/2017 08/12/17   Gladstone Lighter, MD  metoprolol tartrate (LOPRESSOR) 25 MG tablet Take 1 tablet (25 mg total) by mouth 2 (two) times daily. 08/14/17   Isaiah Serge, NP  montelukast (SINGULAIR) 10 MG tablet Take 1 tablet by mouth daily. 07/10/17   [provider]  multivitamin-iron-minerals-folic acid (CENTRUM) chewable tablet Chew 1 tablet by mouth daily.    [provider]  nicotine (NICODERM CQ - DOSED IN MG/24 HOURS) 14 mg/24hr patch Place 1 patch (14 mg total) onto the skin daily. Patient taking differently: Place 7 mg onto the skin daily.  08/14/17   Isaiah Serge, NP  nitroGLYCERIN (NITROSTAT) 0.4 MG SL tablet Place 1 tablet (0.4 mg total) under the tongue every 5 (five) minutes as needed for chest pain. Patient not taking: Reported on 09/23/2017 08/14/17   Isaiah Serge, NP  ondansetron (ZOFRAN ODT) 4 MG disintegrating tablet Take 1 tablet (4 mg total) by mouth every 8 (eight) hours as needed for nausea or vomiting. 12/26/18  Carrie Mew, MD  sitaGLIPtin-metformin (JANUMET) 50-1000 MG tablet Take 1 tablet by mouth 2 (two) times daily with a meal.    [provider]  ticagrelor (BRILINTA) 90 MG TABS tablet Take 1 tablet (90 mg total) by mouth 2 (two) times daily. 08/14/17   Isaiah Serge, NP     Allergies Patient has no known allergies.   Family History  Problem Relation Age of Onset  . Heart attack Mother   . Melanoma Father   . Parkinsonism Father     Social History Social History   Tobacco Use  . Smoking status: Former Smoker    Packs/day: 1.00    Years: 50.00    Pack years: 50.00    Types: Cigarettes    Quit date: 08/2017    Years since quitting: 1.3  . Smokeless tobacco: Never Used  . Tobacco comment: Has Quit as of May 2019- reviewed relaspe and will continue to followup  Substance Use Topics  . Alcohol use: No  . Drug use: No     Review of Systems  Constitutional:   No fever or chills.  ENT:   No sore throat. No rhinorrhea. Cardiovascular:   No chest pain or syncope. Respiratory:   No dyspnea or cough. Gastrointestinal:   Negative for abdominal pain, vomiting and diarrhea.  Musculoskeletal: Right hand pain All other systems reviewed and are negative except as documented above in ROS and HPI.  ____________________________________________   PHYSICAL EXAM:  VITAL SIGNS: ED Triage Vitals  Enc Vitals Group     BP 12/26/18 0810 (!) 181/95     Pulse Rate 12/26/18 0810 79     Resp --      Temp 12/26/18 0810 98.2 F (36.8 C)     Temp Source 12/26/18 0810 Oral     SpO2 12/26/18 0810 95 %     Weight 12/26/18 0814 210 lb (95.3 kg)     Height 12/26/18 0814 6' (1.829 m)     Head Circumference --      Peak Flow --      Pain Score 12/26/18 0814 9     Pain Loc --      Pain Edu? --      Excl. in [place]? --     Vital signs reviewed, nursing assessments reviewed.   Constitutional:   Alert and oriented. Non-toxic appearance. Eyes:   Conjunctivae are normal. EOMI. ENT      Head:   Normocephalic and atraumatic.  Cardiovascular:   Normal radial pulse.  Cap refill less than 2 seconds. Respiratory:   Normal respiratory effort without tachypnea Musculoskeletal:   Normal range of motion in all extremities. No joint effusions.  There is a 1 cm linear laceration over the dorsum of the hand between the first and second extensor tendons.  There is continuous venous oozing from the proximal aspect of the wound which is controllable with direct pressure.  No tendon or bone involvement, no arterial bleeding.. Neurologic:   Normal speech and language.  Motor grossly intact. No acute focal neurologic deficits are appreciated.  Skin:    Skin is warm, dry with hand wound as above.  There are 3 other superficial scratches that do not penetrate the skin layer on the right hand.  ____________________________________________     LABS (pertinent positives/negatives) (all labs ordered are listed, but only abnormal results are displayed) Labs Reviewed - No data to display ____________________________________________   EKG    ____________________________________________    RADIOLOGY  No  results found.  ____________________________________________   PROCEDURES .Marland KitchenLaceration Repair  Date/Time: 12/26/2018 9:11 AM Performed by: Carrie Mew, MD Authorized by: Carrie Mew, MD   Consent:    Consent obtained:  Verbal   Consent given by:  Patient   Risks discussed:  Infection, pain, retained foreign body, poor cosmetic result and poor wound healing   Alternatives discussed:  Referral and no treatment Anesthesia (see MAR for exact dosages):    Anesthesia method:  None Laceration details:    Location:  Hand   Hand location:  R hand, dorsum   Length (cm):  1 Repair type:    Repair type:  Simple Exploration:    Hemostasis achieved with:  Direct pressure (surgicell)   Wound exploration: entire depth of wound probed and visualized     Wound extent: no fascia violation noted, no foreign bodies/material noted, no muscle damage noted, no nerve damage noted, no tendon damage noted, no underlying fracture noted and no vascular damage noted     Contaminated: no   Treatment:    Area cleansed with:  Saline and Betadine   Amount of cleaning:  Extensive   Irrigation solution:  Sterile saline   Visualized foreign bodies/material removed: no   Skin repair:    Repair method:  Steri-Strips   Number of Steri-Strips:  2 Approximation:    Approximation:  Loose Post-procedure details:    Dressing:  Sterile dressing   Patient tolerance of procedure:  Tolerated well, no immediate complications    ____________________________________________    CLINICAL IMPRESSION / ASSESSMENT AND PLAN / ED COURSE  Medications ordered in the ED: Medications  tranexamic acid (CYKLOKAPRON) injection 500 mg (has no  administration in time range)    Pertinent labs & imaging results that were available during my care of the patient were reviewed by me and considered in my medical decision making (see chart for details).  Henry [name] was evaluated in Emergency Department on 12/26/2018 for the symptoms described in the history of present illness. He was evaluated in the context of the global COVID-19 pandemic, which necessitated consideration that the patient might be at risk for infection with the SARS-CoV-2 virus that causes COVID-19. Institutional protocols and algorithms that pertain to the evaluation of patients at risk for COVID-19 are in a state of rapid change based on information released by regulatory bodies including the CDC and federal and state organizations. These policies and algorithms were followed during the patient's care in the ED.   Patient presents with laceration to right hand due to a cat scratch.  There is some swelling and early evidence of developing cellulitis.  Wound was cleansed with Betadine, irrigated, and achieved hemostasis with Surgicel and direct pressure, loosely reapproximated with Steri-Strips.  I will start the patient on Augmentin, Zofran as needed, follow-up with primary care.  Counseled patient on how this may complicate his diabetes management and continue to monitor his blood sugars closely and drink plenty of water.      ____________________________________________   FINAL CLINICAL IMPRESSION(S) / ED DIAGNOSES    Final diagnoses:  Cat scratch of hand, right, initial encounter  Laceration of right hand without foreign body, initial encounter     ED Discharge Orders         Ordered    amoxicillin-clavulanate (AUGMENTIN) 875-125 MG tablet  2 times daily     12/26/18 0906    ondansetron (ZOFRAN ODT) 4 MG disintegrating tablet  Every 8 hours PRN     12/26/18 0906  Portions of this note were generated with dragon dictation software. Dictation  errors may occur despite best attempts at proofreading.   Carrie Mew, MD 12/26/18 [phone]

## 2018-12-26 NOTE — ED Notes (Addendum)
FIRST NURSE NOTE:  Pt scratched by neighbor's cat this morning, states unable to get bleeding to stop. Pt takes Brillinta.

## 2018-12-26 NOTE — ED Notes (Signed)
Called Pharmacy and  They will send up medication for patient

## 2018-12-26 NOTE — ED Triage Notes (Addendum)
Pt reports he was scratched by familiar neighborhood cat.  Pt states owners said cat is up to date with vaccines.  Last tetanus shot pt was in the last 1-2 years.  Pt has bleeding stained on gauze, states he takes Brillinta.

## 2019-01-13 DIAGNOSIS — Z86010 Personal history of colonic polyps: Secondary | ICD-10-CM | POA: Diagnosis not present

## 2019-01-27 DIAGNOSIS — R200 Anesthesia of skin: Secondary | ICD-10-CM | POA: Diagnosis not present

## 2019-01-27 DIAGNOSIS — R202 Paresthesia of skin: Secondary | ICD-10-CM | POA: Diagnosis not present

## 2019-01-29 DIAGNOSIS — C4441 Basal cell carcinoma of skin of scalp and neck: Secondary | ICD-10-CM | POA: Diagnosis not present

## 2019-01-29 DIAGNOSIS — C44519 Basal cell carcinoma of skin of other part of trunk: Secondary | ICD-10-CM | POA: Diagnosis not present

## 2019-02-08 DIAGNOSIS — I2510 Atherosclerotic heart disease of native coronary artery without angina pectoris: Secondary | ICD-10-CM | POA: Diagnosis not present

## 2019-02-08 DIAGNOSIS — Z79899 Other long term (current) drug therapy: Secondary | ICD-10-CM | POA: Diagnosis not present

## 2019-02-08 DIAGNOSIS — Z0000 Encounter for general adult medical examination without abnormal findings: Secondary | ICD-10-CM | POA: Diagnosis not present

## 2019-02-08 DIAGNOSIS — M25569 Pain in unspecified knee: Secondary | ICD-10-CM | POA: Diagnosis not present

## 2019-02-08 DIAGNOSIS — M79673 Pain in unspecified foot: Secondary | ICD-10-CM | POA: Diagnosis not present

## 2019-02-08 DIAGNOSIS — N200 Calculus of kidney: Secondary | ICD-10-CM | POA: Diagnosis not present

## 2019-02-08 DIAGNOSIS — E119 Type 2 diabetes mellitus without complications: Secondary | ICD-10-CM | POA: Diagnosis not present

## 2019-02-08 DIAGNOSIS — I10 Essential (primary) hypertension: Secondary | ICD-10-CM | POA: Diagnosis not present

## 2019-02-08 DIAGNOSIS — E785 Hyperlipidemia, unspecified: Secondary | ICD-10-CM | POA: Diagnosis not present

## 2019-02-15 DIAGNOSIS — R202 Paresthesia of skin: Secondary | ICD-10-CM | POA: Diagnosis not present

## 2019-02-15 DIAGNOSIS — R200 Anesthesia of skin: Secondary | ICD-10-CM | POA: Diagnosis not present

## 2019-03-03 DIAGNOSIS — M79641 Pain in right hand: Secondary | ICD-10-CM | POA: Diagnosis not present

## 2019-03-09 DIAGNOSIS — R200 Anesthesia of skin: Secondary | ICD-10-CM | POA: Diagnosis not present

## 2019-03-09 DIAGNOSIS — R202 Paresthesia of skin: Secondary | ICD-10-CM | POA: Diagnosis not present

## 2019-03-22 DIAGNOSIS — M25562 Pain in left knee: Secondary | ICD-10-CM | POA: Diagnosis not present

## 2019-03-22 DIAGNOSIS — M170 Bilateral primary osteoarthritis of knee: Secondary | ICD-10-CM | POA: Diagnosis not present

## 2019-03-22 DIAGNOSIS — M25561 Pain in right knee: Secondary | ICD-10-CM | POA: Diagnosis not present

## 2019-03-30 DIAGNOSIS — H40003 Preglaucoma, unspecified, bilateral: Secondary | ICD-10-CM | POA: Diagnosis not present

## 2019-04-05 DIAGNOSIS — H40003 Preglaucoma, unspecified, bilateral: Secondary | ICD-10-CM | POA: Diagnosis not present

## 2019-04-23 ENCOUNTER — Other Ambulatory Visit: Payer: Self-pay

## 2019-04-23 ENCOUNTER — Other Ambulatory Visit: Admission: RE | Admit: 2019-04-23 | Payer: Medicare HMO | Source: Ambulatory Visit

## 2019-04-23 ENCOUNTER — Other Ambulatory Visit
Admission: RE | Admit: 2019-04-23 | Discharge: 2019-04-23 | Disposition: A | Discharge status: Home / Self Care | Payer: Medicare HMO | Source: Ambulatory Visit | Attending: General Surgery | Admitting: General Surgery

## 2019-04-23 DIAGNOSIS — Z01812 Encounter for preprocedural laboratory examination: Secondary | ICD-10-CM | POA: Diagnosis not present

## 2019-04-23 DIAGNOSIS — Z20822 Contact with and (suspected) exposure to covid-19: Secondary | ICD-10-CM | POA: Insufficient documentation

## 2019-04-24 LAB — SARS CORONAVIRUS 2 (TAT 6-24 HRS): SARS Coronavirus 2: NEGATIVE

## 2019-04-26 ENCOUNTER — Other Ambulatory Visit: Payer: Medicare HMO

## 2019-04-26 ENCOUNTER — Ambulatory Visit: Admit: 2019-04-26 | Payer: Medicare HMO | Admitting: Internal Medicine

## 2019-04-26 SURGERY — COLONOSCOPY WITH PROPOFOL
Anesthesia: General

## 2019-04-27 ENCOUNTER — Encounter: Payer: Self-pay | Admitting: General Surgery

## 2019-04-28 ENCOUNTER — Ambulatory Visit: Payer: Medicare HMO | Admitting: Registered Nurse

## 2019-04-28 ENCOUNTER — Encounter: Payer: Self-pay | Admitting: General Surgery

## 2019-04-28 ENCOUNTER — Encounter
Admission: RE | Disposition: A | Discharge status: Home / Self Care | Payer: Self-pay | Source: Home / Self Care | Attending: General Surgery

## 2019-04-28 ENCOUNTER — Other Ambulatory Visit: Payer: Self-pay

## 2019-04-28 ENCOUNTER — Ambulatory Visit
Admission: RE | Admit: 2019-04-28 | Discharge: 2019-04-28 | Disposition: A | Discharge status: Home / Self Care | Payer: Medicare HMO | Attending: General Surgery | Admitting: General Surgery

## 2019-04-28 DIAGNOSIS — I255 Ischemic cardiomyopathy: Secondary | ICD-10-CM | POA: Diagnosis not present

## 2019-04-28 DIAGNOSIS — I252 Old myocardial infarction: Secondary | ICD-10-CM | POA: Diagnosis not present

## 2019-04-28 DIAGNOSIS — Z1211 Encounter for screening for malignant neoplasm of colon: Secondary | ICD-10-CM | POA: Diagnosis not present

## 2019-04-28 DIAGNOSIS — K5730 Diverticulosis of large intestine without perforation or abscess without bleeding: Secondary | ICD-10-CM | POA: Insufficient documentation

## 2019-04-28 DIAGNOSIS — Z955 Presence of coronary angioplasty implant and graft: Secondary | ICD-10-CM | POA: Diagnosis not present

## 2019-04-28 DIAGNOSIS — E1122 Type 2 diabetes mellitus with diabetic chronic kidney disease: Secondary | ICD-10-CM | POA: Insufficient documentation

## 2019-04-28 DIAGNOSIS — K635 Polyp of colon: Secondary | ICD-10-CM | POA: Diagnosis not present

## 2019-04-28 DIAGNOSIS — I2510 Atherosclerotic heart disease of native coronary artery without angina pectoris: Secondary | ICD-10-CM | POA: Diagnosis not present

## 2019-04-28 DIAGNOSIS — D123 Benign neoplasm of transverse colon: Secondary | ICD-10-CM | POA: Diagnosis not present

## 2019-04-28 DIAGNOSIS — Z79899 Other long term (current) drug therapy: Secondary | ICD-10-CM | POA: Diagnosis not present

## 2019-04-28 DIAGNOSIS — N182 Chronic kidney disease, stage 2 (mild): Secondary | ICD-10-CM | POA: Insufficient documentation

## 2019-04-28 DIAGNOSIS — D122 Benign neoplasm of ascending colon: Secondary | ICD-10-CM | POA: Diagnosis not present

## 2019-04-28 DIAGNOSIS — Z794 Long term (current) use of insulin: Secondary | ICD-10-CM | POA: Diagnosis not present

## 2019-04-28 DIAGNOSIS — Z86010 Personal history of colonic polyps: Secondary | ICD-10-CM | POA: Insufficient documentation

## 2019-04-28 DIAGNOSIS — D126 Benign neoplasm of colon, unspecified: Secondary | ICD-10-CM | POA: Diagnosis not present

## 2019-04-28 DIAGNOSIS — I129 Hypertensive chronic kidney disease with stage 1 through stage 4 chronic kidney disease, or unspecified chronic kidney disease: Secondary | ICD-10-CM | POA: Insufficient documentation

## 2019-04-28 DIAGNOSIS — M1990 Unspecified osteoarthritis, unspecified site: Secondary | ICD-10-CM | POA: Diagnosis not present

## 2019-04-28 DIAGNOSIS — E785 Hyperlipidemia, unspecified: Secondary | ICD-10-CM | POA: Diagnosis not present

## 2019-04-28 DIAGNOSIS — Z87891 Personal history of nicotine dependence: Secondary | ICD-10-CM | POA: Insufficient documentation

## 2019-04-28 DIAGNOSIS — K5790 Diverticulosis of intestine, part unspecified, without perforation or abscess without bleeding: Secondary | ICD-10-CM | POA: Diagnosis not present

## 2019-04-28 DIAGNOSIS — Z7982 Long term (current) use of aspirin: Secondary | ICD-10-CM | POA: Insufficient documentation

## 2019-04-28 HISTORY — DX: Personal history of urinary calculi: Z87.442

## 2019-04-28 HISTORY — DX: Polyneuropathy, unspecified: G62.9

## 2019-04-28 HISTORY — DX: Acute myocardial infarction, unspecified: I21.9

## 2019-04-28 HISTORY — PX: COLONOSCOPY WITH PROPOFOL: SHX5780

## 2019-04-28 LAB — GLUCOSE, CAPILLARY
Glucose-Capillary: 101 mg/dL — ABNORMAL HIGH (ref 70–99)
Glucose-Capillary: 120 mg/dL — ABNORMAL HIGH (ref 70–99)

## 2019-04-28 SURGERY — COLONOSCOPY WITH PROPOFOL
Anesthesia: General

## 2019-04-28 MED ORDER — SODIUM CHLORIDE 0.9 % IV SOLN: INTRAVENOUS | Status: DC

## 2019-04-28 MED ORDER — EPHEDRINE SULFATE 50 MG/ML IJ SOLN
INTRAMUSCULAR | Status: DC | PRN
  Administered 2019-04-28: 5 mg via INTRAVENOUS

## 2019-04-28 MED ORDER — PROPOFOL 10 MG/ML IV BOLUS
INTRAVENOUS | Status: DC | PRN
  Administered 2019-04-28 (×2): 20 mg via INTRAVENOUS
  Administered 2019-04-28: 30 mg via INTRAVENOUS
  Administered 2019-04-28: 20 mg via INTRAVENOUS
  Administered 2019-04-28 (×2): 10 mg via INTRAVENOUS

## 2019-04-28 MED ORDER — LIDOCAINE HCL (CARDIAC) PF 100 MG/5ML IV SOSY
PREFILLED_SYRINGE | INTRAVENOUS | Status: DC | PRN
  Administered 2019-04-28: 60 mg via INTRAVENOUS
  Administered 2019-04-28: 40 mg via INTRAVENOUS

## 2019-04-28 MED ORDER — PROPOFOL 500 MG/50ML IV EMUL
INTRAVENOUS | Status: DC | PRN
  Administered 2019-04-28: 75 ug/kg/min via INTRAVENOUS

## 2019-04-28 MED ORDER — SODIUM CHLORIDE (PF) 0.9 % IJ SOLN
INTRAMUSCULAR | Status: AC
  Filled 2019-04-28: qty 10

## 2019-04-28 MED ORDER — PROPOFOL 500 MG/50ML IV EMUL
INTRAVENOUS | Status: AC
  Filled 2019-04-28: qty 50

## 2019-04-28 MED ORDER — PHENYLEPHRINE HCL (PRESSORS) 10 MG/ML IV SOLN
INTRAVENOUS | Status: DC | PRN
  Administered 2019-04-28: 150 ug via INTRAVENOUS
  Administered 2019-04-28: 100 ug via INTRAVENOUS
  Administered 2019-04-28: 200 ug via INTRAVENOUS
  Administered 2019-04-28 (×2): 100 ug via INTRAVENOUS

## 2019-04-28 NOTE — H&P (Signed)
Henry [name] [id number] 12/26/1945     HPI:  74 y/o male with multiple polyps on exam in 2017. For f/u endoscopy.   Medications Prior to Admission  Medication Sig Dispense Refill Last Dose  . acetaminophen (TYLENOL) 325 MG tablet Take 2 tablets (650 mg total) by mouth every 6 (six) hours as needed for mild pain (or Fever >/= 101). (Patient not taking: Reported on 08/20/2017)     . albuterol (PROVENTIL HFA;VENTOLIN HFA) 108 (90 Base) MCG/ACT inhaler Inhale 2 puffs into the lungs every 6 (six) hours as needed for wheezing or shortness of breath. 1 Inhaler 0   . amoxicillin-clavulanate (AUGMENTIN) 875-125 MG tablet Take 1 tablet by mouth 2 (two) times daily. 20 tablet 0   . aspirin EC 81 MG EC tablet Take 1 tablet (81 mg total) by mouth daily.     Marland Kitchen atorvastatin (LIPITOR) 80 MG tablet Take 1 tablet (80 mg total) by mouth daily at 6 PM. 30 tablet 2   . cetirizine (ZYRTEC) 10 MG tablet Take 10 mg by mouth daily.     . fluticasone (FLONASE) 50 MCG/ACT nasal spray Place 1 spray into both nostrils daily.     Marland Kitchen HYDROcodone-acetaminophen (NORCO/VICODIN) 5-325 MG tablet Take 1 tablet by mouth every 6 (six) hours as needed. Pt to take twice a day as needed.     . insulin detemir (LEVEMIR) 100 UNIT/ML injection Inject 0.5 mLs (50 Units total) into the skin 2 (two) times daily. 10 mL 11   . irbesartan (AVAPRO) 300 MG tablet TAKE ONE TABLET EVERY DAY 90 tablet 2   . loratadine (CLARITIN) 10 MG tablet Take 1 tablet (10 mg total) by mouth daily as needed for allergies. (Patient not taking: Reported on 08/20/2017) 30 tablet 0   . metoprolol tartrate (LOPRESSOR) 25 MG tablet Take 1 tablet (25 mg total) by mouth 2 (two) times daily. 60 tablet 2   . montelukast (SINGULAIR) 10 MG tablet Take 1 tablet by mouth daily.     . multivitamin-iron-minerals-folic acid (CENTRUM) chewable tablet Chew 1 tablet by mouth daily.     . nicotine (NICODERM CQ - DOSED IN MG/24 HOURS) 14 mg/24hr patch Place 1 patch (14 mg total)  onto the skin daily. (Patient taking differently: Place 7 mg onto the skin daily. ) 28 patch 0   . nitroGLYCERIN (NITROSTAT) 0.4 MG SL tablet Place 1 tablet (0.4 mg total) under the tongue every 5 (five) minutes as needed for chest pain. (Patient not taking: Reported on 09/23/2017) 25 tablet 2   . ondansetron (ZOFRAN ODT) 4 MG disintegrating tablet Take 1 tablet (4 mg total) by mouth every 8 (eight) hours as needed for nausea or vomiting. 20 tablet 0   . sitaGLIPtin-metformin (JANUMET) 50-1000 MG tablet Take 1 tablet by mouth 2 (two) times daily with a meal.     . ticagrelor (BRILINTA) 90 MG TABS tablet Take 1 tablet (90 mg total) by mouth 2 (two) times daily. 60 tablet 0    No Known Allergies Past Medical History:  Diagnosis Date  . Arthritis   . CAD (coronary artery disease)    a. anterior stemi 08/10/2017; b. LHC 08/10/17: pLAD 70, p-mLAD 99 thrombotic, ostD1 70, p-mLCx 30, pRCA-1 50 calcified, pRCA-2 99 calcified, dRCA 30. Successful PCI/DES to p-mLAD x 2  . Cancer ([place])    skin  . CKD (chronic kidney disease), stage II   . Glaucoma   . Hyperlipidemia   . Hypertension   . Insulin dependent diabetes  mellitus   . Ischemic cardiomyopathy    a. TTE 08/10/17: technically difficult study, no diagnostic RWMA, EF 40-50%, poor windows, very difficult study   . S/P angioplasty with stent 08/10/17 emergently to LAD ,  08/13/17 atherectomy and stent to pRCA  08/14/2017   Past Surgical History:  Procedure Laterality Date  . COLONOSCOPY WITH PROPOFOL N/A 12/08/2014   Procedure: COLONOSCOPY WITH PROPOFOL;  Surgeon: Manya Silvas, MD;  Location[facility] ENDOSCOPY;  Service: Endoscopy;  Laterality: N/A;  . COLONOSCOPY WITH PROPOFOL N/A 01/10/2016   Procedure: COLONOSCOPY WITH PROPOFOL;  Surgeon: Manya Silvas, MD;  Location: [facility] ENDOSCOPY;  Service: Endoscopy;  Laterality: N/A;  . CORONARY ATHERECTOMY N/A 08/13/2017   Procedure: CORONARY ATHERECTOMY;  Surgeon: Wellington Hampshire, MD;  Location: [place] CV LAB;   Service: Cardiovascular;  Laterality: N/A;  . CORONARY STENT INTERVENTION N/A 08/13/2017   Procedure: CORONARY STENT INTERVENTION;  Surgeon: Wellington Hampshire, MD;  Location: [place] CV LAB;  Service: Cardiovascular;  Laterality: N/A;  . CORONARY/GRAFT ACUTE MI REVASCULARIZATION N/A 08/10/2017   Procedure: Coronary/Graft Acute MI Revascularization;  Surgeon: Burnell Blanks, MD;  Location: [place] [facility] LAB;  Service: Cardiovascular;  Laterality: N/A;  . EXTRACORPOREAL SHOCK WAVE LITHOTRIPSY Right 11/09/2015   Procedure: EXTRACORPOREAL SHOCK WAVE LITHOTRIPSY (ESWL);  Surgeon: Royston Cowper, MD;  Location: [facility] ORS;  Service: Urology;  Laterality: Right;  . EXTRACORPOREAL SHOCK WAVE LITHOTRIPSY Left 10/17/2016   Procedure: EXTRACORPOREAL SHOCK WAVE LITHOTRIPSY (ESWL);  Surgeon: Royston Cowper, MD;  Location: [facility] ORS;  Service: Urology;  Laterality: Left;  . LEFT HEART CATH AND CORONARY ANGIOGRAPHY N/A 08/10/2017   Procedure: LEFT HEART CATH AND CORONARY ANGIOGRAPHY;  Surgeon: Burnell Blanks, MD;  Location: [place] CV LAB;  Service: Cardiovascular;  Laterality: N/A;  . LITHOTRIPSY     x 6  . PAROTIDECTOMY  2015  . TEMPORARY PACEMAKER N/A 08/13/2017   Procedure: TEMPORARY PACEMAKER;  Surgeon: Wellington Hampshire, MD;  Location: [place] CV LAB;  Service: Cardiovascular;  Laterality: N/A;  . TONSILLECTOMY     Social History   Socioeconomic History  . Marital status: Single    Spouse name: Not on file  . Number of children: Not on file  . Years of education: 66   . Highest education level: Bachelor's degree (e.g., BA, AB, BS)  Occupational History  . Occupation: retired   Tobacco Use  . Smoking status: Former Smoker    Packs/day: 1.00    Years: 50.00    Pack years: 50.00    Types: Cigarettes    Quit date: 08/2017    Years since quitting: 1.7  . Smokeless tobacco: Never Used  . Tobacco comment: Has Quit as of May 2019- reviewed relaspe and will continue to  followup  Substance and Sexual Activity  . Alcohol use: No  . Drug use: No  . Sexual activity: Not on file  Other Topics Concern  . Not on file  Social History Narrative  . Not on file   Social Determinants of Health   Financial Resource Strain:   . Difficulty of Paying Living Expenses: Not on file  Food Insecurity:   . Worried About Charity fundraiser in the Last Year: Not on file  . Ran Out of Food in the Last Year: Not on file  Transportation Needs:   . Lack of Transportation (Medical): Not on file  . Lack of Transportation (Non-Medical): Not on file  Physical Activity:   .  Days of Exercise per Week: Not on file  . Minutes of Exercise per Session: Not on file  Stress:   . Feeling of Stress : Not on file  Social Connections:   . Frequency of Communication with Friends and Family: Not on file  . Frequency of Social Gatherings with Friends and Family: Not on file  . Attends Religious Services: Not on file  . Active Member of Clubs or Organizations: Not on file  . Attends Archivist Meetings: Not on file  . Marital Status: Not on file  Intimate Partner Violence:   . Fear of Current or Ex-Partner: Not on file  . Emotionally Abused: Not on file  . Physically Abused: Not on file  . Sexually Abused: Not on file   Social History   Social History Narrative  . Not on file     ROS: Negative.   01/10/16:CSY (indic: PHx polyps) - 6 polyps removed, including large SSA in cecum, 2 polyps with chronic active inflammation and crypt hyperplasia in cecum & ascending colon, 3 hyperplastic polyps in ascending & rectosigmoid colon, and 1 TA in rectum.  PE: HEENT: Negative. Lungs: Clear. Cardio: RR.  Assessment/Plan:  Proceed with planned endoscopy. [facility] 04/28/2019

## 2019-04-28 NOTE — Op Note (Signed)
[facility] Gastroenterology Patient Name: Henry [name] Procedure Date: 04/28/2019 9:12 AM MRN: [id number] Account #: [id number] Date of Birth: [birth year] Admit Type: Outpatient Age: 74 Room: [facility] ENDO ROOM 1 Gender: Male Note Status: Finalized Procedure:             Colonoscopy Indications:           High risk colon cancer surveillance: Personal history                         of colonic polyps Providers:             Robert Bellow, MD Referring MD:          Youlanda Roys. Lovie Macadamia, MD (Referring MD) Medicines:             Monitored Anesthesia Care Complications:         No immediate complications. Procedure:             Pre-Anesthesia Assessment:                        - Prior to the procedure, a History and Physical was                         performed, and patient medications, allergies and                         sensitivities were reviewed. The patient's tolerance                         of previous anesthesia was reviewed.                        - The risks and benefits of the procedure and the                         sedation options and risks were discussed with the                         patient. All questions were answered and informed                         consent was obtained.                        After obtaining informed consent, the colonoscope was                         passed under direct vision. Throughout the procedure,                         the patient's blood pressure, pulse, and oxygen                         saturations were monitored continuously. The was                         introduced through the anus and advanced to the the                         cecum, identified  by appendiceal orifice and ileocecal                         valve. The colonoscopy was somewhat difficult due to a                         tortuous colon. Successful completion of the procedure                         was aided by using manual pressure. The  patient                         tolerated the procedure well. The quality of the bowel                         preparation was good. Findings:      A 15 mm polyp was found in the cecum. The polyp was sessile. The polyp       was removed with a hot snare. Resection was incomplete, multiple       biopsies of residual ridge. Retrival was complete.      A 5 mm polyp was found in the cecum. The polyp was sessile. This was       biopsied with a cold forceps for histology. Clip appied for hemostasis.      A 20 mm polyp was found in the mid transverse colon. The polyp was       semi-pedunculated. The polyp was removed with a hot snare. Resection and       retrieval were complete.      A 5 mm polyp was found in the transverse colon mid transverse colon. The       polyp was sessile. Biopsies were taken with a cold forceps for histology.      Multiple medium-mouthed diverticula were found in the sigmoid colon.      The retroflexed view of the distal rectum and anal verge was normal and       showed no anal or rectal abnormalities. Impression:            - One 15 mm polyp in the cecum, removed with a hot                         snare. Resected and retrieved.                        - One 5 mm polyp in the cecum. Biopsied.                        - One 20 mm polyp in the mid transverse colon, removed                         with a hot snare. Resected and retrieved.                        - One 5 mm polyp in the transverse colon in the mid                         transverse colon. Biopsied.                        -  Diverticulosis in the sigmoid colon.                        - The distal rectum and anal verge are normal on                         retroflexion view. Recommendation:        - Telephone endoscopist for pathology results in 1                         week. Procedure Code(s):     --- Professional ---                        [phone], Colonoscopy, flexible; with removal of                          tumor(s), polyp(s), or other lesion(s) by snare                         technique                        45380, 23, Colonoscopy, flexible; with biopsy, single                         or multiple Diagnosis Code(s):     --- Professional ---                        Z86.010, Personal history of colonic polyps                        K63.5, Polyp of colon                        K57.30, Diverticulosis of large intestine without                         perforation or abscess without bleeding CPT copyright 2019 American Medical Association. All rights reserved. The codes documented in this report are preliminary and upon coder review may  be revised to meet current compliance requirements. Robert Bellow, MD 04/28/2019 10:38:21 AM This report has been signed electronically. Number of Addenda: 0 Note Initiated On: 04/28/2019 9:12 AM Scope Withdrawal Time: 0 hours 58 minutes 40 seconds  Total Procedure Duration: 1 hour 8 minutes 13 seconds  Estimated Blood Loss:  Estimated blood loss: none.      [facility]

## 2019-04-28 NOTE — Anesthesia Postprocedure Evaluation (Signed)
Anesthesia Post Note  Patient: Henry [name]  Procedure(s) Performed: COLONOSCOPY WITH PROPOFOL (N/A )  Patient location during evaluation: Endoscopy Anesthesia Type: General Level of consciousness: awake and alert Pain management: pain level controlled Vital Signs Assessment: post-procedure vital signs reviewed and stable Respiratory status: spontaneous breathing and respiratory function stable Cardiovascular status: stable Anesthetic complications: no     Last Vitals:  Vitals:   04/28/19 1048 04/28/19 1058  BP: 125/66   Pulse: (!) 59 (!) 55  Resp: 16 13  Temp:    SpO2: 99% 100%    Last Pain:  Vitals:   04/28/19 1038  TempSrc:   PainSc: 0-No pain                 Angala Hilgers K

## 2019-04-28 NOTE — Anesthesia Preprocedure Evaluation (Addendum)
Anesthesia Evaluation  Patient identified by MRN, date of birth, ID band Patient awake    Reviewed: Allergy & Precautions, NPO status , Patient's Chart, lab work & pertinent test results  History of Anesthesia Complications Negative for: history of anesthetic complications  Airway Mallampati: III       Dental  (+) Missing, Chipped, Poor Dentition   Pulmonary neg sleep apnea, neg COPD, Not current smoker, former smoker,           Cardiovascular hypertension, + CAD, + Past MI and + Cardiac Stents  (-) CHF (-) dysrhythmias (-) Valvular Problems/Murmurs     Neuro/Psych neg Seizures    GI/Hepatic Neg liver ROS, neg GERD  ,  Endo/Other  diabetes, Type 2, Oral Hypoglycemic Agents  Renal/GU Renal InsufficiencyRenal disease     Musculoskeletal   Abdominal   Peds  Hematology   Anesthesia Other Findings   Reproductive/Obstetrics                            Anesthesia Physical Anesthesia Plan  ASA: III  Anesthesia Plan: General   Post-op Pain Management:    Induction: Intravenous  PONV Risk Score and Plan: 2 and TIVA and Propofol infusion  Airway Management Planned: Nasal Cannula  Additional Equipment:   Intra-op Plan:   Post-operative Plan:   Informed Consent: I have reviewed the patients History and Physical, chart, labs and discussed the procedure including the risks, benefits and alternatives for the proposed anesthesia with the patient or authorized representative who has indicated his/her understanding and acceptance.       Plan Discussed with:   Anesthesia Plan Comments:         Anesthesia Quick Evaluation

## 2019-04-28 NOTE — Transfer of Care (Signed)
Immediate Anesthesia Transfer of Care Note  Patient: Henry [name]  Procedure(s) Performed: COLONOSCOPY WITH PROPOFOL (N/A )  Patient Location: Endoscopy Unit  Anesthesia Type:General  Level of Consciousness: drowsy and patient cooperative  Airway & Oxygen Therapy: Patient Spontanous Breathing  Post-op Assessment: Report given to RN and Post -op Vital signs reviewed and stable  Post vital signs: Reviewed and stable  Last Vitals:  Vitals Value Taken Time  BP 105/67 04/28/19 1038  Temp    Pulse 73 04/28/19 1038  Resp 22 04/28/19 1038  SpO2 100 % 04/28/19 1038  Vitals shown include unvalidated device data.  Last Pain:  Vitals:   04/28/19 0852  TempSrc: Temporal  PainSc: 0-No pain         Complications: No apparent anesthesia complications

## 2019-04-29 ENCOUNTER — Encounter: Payer: Self-pay | Admitting: *Deleted

## 2019-04-30 LAB — SURGICAL PATHOLOGY

## 2019-05-10 DIAGNOSIS — G479 Sleep disorder, unspecified: Secondary | ICD-10-CM | POA: Diagnosis not present

## 2019-05-10 DIAGNOSIS — R200 Anesthesia of skin: Secondary | ICD-10-CM | POA: Diagnosis not present

## 2019-05-10 DIAGNOSIS — M25562 Pain in left knee: Secondary | ICD-10-CM | POA: Diagnosis not present

## 2019-05-10 DIAGNOSIS — M25561 Pain in right knee: Secondary | ICD-10-CM | POA: Diagnosis not present

## 2019-05-10 DIAGNOSIS — G8929 Other chronic pain: Secondary | ICD-10-CM | POA: Diagnosis not present

## 2019-05-29 ENCOUNTER — Other Ambulatory Visit: Payer: Self-pay

## 2019-05-29 ENCOUNTER — Ambulatory Visit: Payer: Medicare HMO | Attending: Internal Medicine

## 2019-05-29 DIAGNOSIS — Z23 Encounter for immunization: Secondary | ICD-10-CM

## 2019-05-29 NOTE — Progress Notes (Signed)
   Covid-19 Vaccination Clinic  Name:  Henry [name]    MRN: [id number] DOB: February 10, 1946  05/29/2019  [name] was observed post Covid-19 immunization for 15 minutes without incidence. He was provided with Vaccine Information Sheet and instruction to access the V-Safe system.   [name] was instructed to call 911 with any severe reactions post vaccine: Marland Kitchen Difficulty breathing  . Swelling of your face and throat  . A fast heartbeat  . A bad rash all over your body  . Dizziness and weakness    Immunizations Administered    Name Date Dose VIS Date Route   Pfizer COVID-19 Vaccine 05/29/2019  9:51 AM 0.3 mL 03/19/2019 Intramuscular   Manufacturer: [place]   Lot: [id number]   [place]: [id number]

## 2019-06-02 DIAGNOSIS — N401 Enlarged prostate with lower urinary tract symptoms: Secondary | ICD-10-CM | POA: Diagnosis not present

## 2019-06-02 DIAGNOSIS — E6609 Other obesity due to excess calories: Secondary | ICD-10-CM | POA: Diagnosis not present

## 2019-06-02 DIAGNOSIS — N200 Calculus of kidney: Secondary | ICD-10-CM | POA: Diagnosis not present

## 2019-06-02 DIAGNOSIS — Z125 Encounter for screening for malignant neoplasm of prostate: Secondary | ICD-10-CM | POA: Diagnosis not present

## 2019-06-03 ENCOUNTER — Telehealth: Payer: Self-pay | Admitting: *Deleted

## 2019-06-03 NOTE — Telephone Encounter (Signed)
Calling to confirm appointment and address of 2nd Covid 19 vaccine.

## 2019-06-03 NOTE — Telephone Encounter (Addendum)
Pt left v/m on triage line [phone] and this is the wrong [place] office for Dr Juluis Pitch. Pt v/m said if someone needs to talk with him to call and leave a message and he will cb.

## 2019-06-04 DIAGNOSIS — Z955 Presence of coronary angioplasty implant and graft: Secondary | ICD-10-CM | POA: Diagnosis not present

## 2019-06-04 DIAGNOSIS — R002 Palpitations: Secondary | ICD-10-CM | POA: Diagnosis not present

## 2019-06-04 DIAGNOSIS — E1129 Type 2 diabetes mellitus with other diabetic kidney complication: Secondary | ICD-10-CM | POA: Diagnosis not present

## 2019-06-04 DIAGNOSIS — J449 Chronic obstructive pulmonary disease, unspecified: Secondary | ICD-10-CM | POA: Diagnosis not present

## 2019-06-04 DIAGNOSIS — I491 Atrial premature depolarization: Secondary | ICD-10-CM | POA: Diagnosis not present

## 2019-06-04 DIAGNOSIS — E785 Hyperlipidemia, unspecified: Secondary | ICD-10-CM | POA: Diagnosis not present

## 2019-06-04 DIAGNOSIS — I10 Essential (primary) hypertension: Secondary | ICD-10-CM | POA: Diagnosis not present

## 2019-06-07 DIAGNOSIS — D225 Melanocytic nevi of trunk: Secondary | ICD-10-CM | POA: Diagnosis not present

## 2019-06-07 DIAGNOSIS — L570 Actinic keratosis: Secondary | ICD-10-CM | POA: Diagnosis not present

## 2019-06-07 DIAGNOSIS — D2262 Melanocytic nevi of left upper limb, including shoulder: Secondary | ICD-10-CM | POA: Diagnosis not present

## 2019-06-07 DIAGNOSIS — D2261 Melanocytic nevi of right upper limb, including shoulder: Secondary | ICD-10-CM | POA: Diagnosis not present

## 2019-06-07 DIAGNOSIS — L821 Other seborrheic keratosis: Secondary | ICD-10-CM | POA: Diagnosis not present

## 2019-06-07 DIAGNOSIS — Z85828 Personal history of other malignant neoplasm of skin: Secondary | ICD-10-CM | POA: Diagnosis not present

## 2019-06-07 DIAGNOSIS — X32XXXA Exposure to sunlight, initial encounter: Secondary | ICD-10-CM | POA: Diagnosis not present

## 2019-06-07 DIAGNOSIS — S00401A Unspecified superficial injury of right ear, initial encounter: Secondary | ICD-10-CM | POA: Diagnosis not present

## 2019-06-16 ENCOUNTER — Telehealth: Payer: Self-pay | Admitting: *Deleted

## 2019-06-16 NOTE — Telephone Encounter (Signed)
He called in wanting to confirm which location he was supposed to go for his 2nd COVID-19 vaccine. I let him know it was 06/22/2019 at the [place] location. "I know exactly where that is".   He thanked me for my help.

## 2019-06-22 ENCOUNTER — Ambulatory Visit: Payer: Medicare HMO | Attending: Internal Medicine

## 2019-06-22 DIAGNOSIS — Z23 Encounter for immunization: Secondary | ICD-10-CM

## 2019-06-22 NOTE — Progress Notes (Signed)
   Covid-19 Vaccination Clinic  Name:  Henry [name]    MRN: [id number] DOB: May 07, 1945  06/22/2019  Henry [name] was observed post Covid-19 immunization for 15 minutes without incident. He was provided with Vaccine Information Sheet and instruction to access the V-Safe system.   Henry [name] was instructed to call 911 with any severe reactions post vaccine: Marland Kitchen Difficulty breathing  . Swelling of face and throat  . A fast heartbeat  . A bad rash all over body  . Dizziness and weakness   Immunizations Administered    Name Date Dose VIS Date Route   Pfizer COVID-19 Vaccine 06/22/2019  9:52 AM 0.3 mL 03/19/2019 Intramuscular   Manufacturer: [place]   Lot: [id number]   [place]: [id number]

## 2019-06-26 ENCOUNTER — Emergency Department: Payer: Medicare HMO

## 2019-06-26 ENCOUNTER — Encounter: Payer: Self-pay | Admitting: Emergency Medicine

## 2019-06-26 ENCOUNTER — Other Ambulatory Visit: Payer: Self-pay

## 2019-06-26 ENCOUNTER — Emergency Department
Admission: EM | Admit: 2019-06-26 | Discharge: 2019-06-26 | Disposition: A | Discharge status: Home / Self Care | Payer: Medicare HMO | Attending: Emergency Medicine | Admitting: Emergency Medicine

## 2019-06-26 DIAGNOSIS — N182 Chronic kidney disease, stage 2 (mild): Secondary | ICD-10-CM | POA: Insufficient documentation

## 2019-06-26 DIAGNOSIS — Z7902 Long term (current) use of antithrombotics/antiplatelets: Secondary | ICD-10-CM | POA: Insufficient documentation

## 2019-06-26 DIAGNOSIS — Z87891 Personal history of nicotine dependence: Secondary | ICD-10-CM | POA: Insufficient documentation

## 2019-06-26 DIAGNOSIS — Z7982 Long term (current) use of aspirin: Secondary | ICD-10-CM | POA: Insufficient documentation

## 2019-06-26 DIAGNOSIS — Z794 Long term (current) use of insulin: Secondary | ICD-10-CM | POA: Diagnosis not present

## 2019-06-26 DIAGNOSIS — R069 Unspecified abnormalities of breathing: Secondary | ICD-10-CM | POA: Diagnosis not present

## 2019-06-26 DIAGNOSIS — I129 Hypertensive chronic kidney disease with stage 1 through stage 4 chronic kidney disease, or unspecified chronic kidney disease: Secondary | ICD-10-CM | POA: Insufficient documentation

## 2019-06-26 DIAGNOSIS — R0602 Shortness of breath: Secondary | ICD-10-CM | POA: Diagnosis not present

## 2019-06-26 DIAGNOSIS — J441 Chronic obstructive pulmonary disease with (acute) exacerbation: Secondary | ICD-10-CM | POA: Diagnosis not present

## 2019-06-26 DIAGNOSIS — E1122 Type 2 diabetes mellitus with diabetic chronic kidney disease: Secondary | ICD-10-CM | POA: Diagnosis not present

## 2019-06-26 DIAGNOSIS — Z79899 Other long term (current) drug therapy: Secondary | ICD-10-CM | POA: Diagnosis not present

## 2019-06-26 LAB — BASIC METABOLIC PANEL
Anion gap: 10 (ref 5–15)
BUN: 20 mg/dL (ref 8–23)
CO2: 29 mmol/L (ref 22–32)
Calcium: 10.5 mg/dL — ABNORMAL HIGH (ref 8.9–10.3)
Chloride: 99 mmol/L (ref 98–111)
Creatinine, Ser: 1.25 mg/dL — ABNORMAL HIGH (ref 0.61–1.24)
GFR calc Af Amer: >60 mL/min (ref >60)
GFR calc non Af Amer: 57 mL/min — ABNORMAL LOW (ref >60)
Glucose, Bld: 105 mg/dL — ABNORMAL HIGH (ref 70–99)
Potassium: 4 mmol/L (ref 3.5–5.1)
Sodium: 138 mmol/L (ref 135–145)

## 2019-06-26 LAB — CBC
HCT: 43.7 % (ref 39.0–52.0)
Hemoglobin: 13.7 g/dL (ref 13.0–17.0)
MCH: 27.4 pg (ref 26.0–34.0)
MCHC: 31.4 g/dL (ref 30.0–36.0)
MCV: 87.4 fL (ref 80.0–100.0)
Platelets: 340 10*3/uL (ref 150–400)
RBC: 5 MIL/uL (ref 4.22–5.81)
RDW: 13.9 % (ref 11.5–15.5)
WBC: 8.5 10*3/uL (ref 4.0–10.5)
nRBC: 0 % (ref 0.0–0.2)

## 2019-06-26 LAB — TROPONIN I (HIGH SENSITIVITY)
Troponin I (High Sensitivity): 3 ng/L (ref <18)
Troponin I (High Sensitivity): 4 ng/L (ref <18)

## 2019-06-26 MED ORDER — PREDNISONE 20 MG PO TABS: ORAL_TABLET | ORAL | 0 refills | Status: DC

## 2019-06-26 MED ORDER — PREDNISONE 20 MG PO TABS
60.0000 mg | ORAL_TABLET | Freq: Once | ORAL | Status: AC
  Administered 2019-06-26: 60 mg via ORAL
  Filled 2019-06-26: qty 3

## 2019-06-26 NOTE — ED Triage Notes (Addendum)
Patient brought in by ems from home. Patient with complaint of shortness of breath and just not feeling good. Patient denies chest pain. States that he feels tired. Patient states that he had his second Covid vaccination on Tuesday.

## 2019-06-26 NOTE — Discharge Instructions (Signed)
Take prednisone taper as prescribed   Use albuterol every 4 hrs as needed for cough or wheezing   See your doctor   Return to ER if you have worse shortness of breath, cough, trouble breathing

## 2019-06-26 NOTE — ED Provider Notes (Signed)
[place] EMERGENCY DEPARTMENT Provider Note   CSN: [id number] Arrival date & time: 06/26/19  1958     History Chief Complaint  Patient presents with  . Shortness of Breath    Henry [name] is a 74 y.o. male history of CAD, CKD, hypertension, hyperlipidemia, COPD, he presented with shortness of breath.  Patient states that he ate some food and then shortly afterwards had an episode of shortness of breath.  He had onset of wheezing so gave himself 3 albuterol treatments.  He states that he feels fine right now.  He states that he had second Covid vaccine 4 days ago and has similar symptoms.  Denies any fevers or chills or chest pain.  The history is provided by the patient.       Past Medical History:  Diagnosis Date  . Arthritis   . CAD (coronary artery disease)    a. anterior stemi 08/10/2017; b. LHC 08/10/17: pLAD 70, p-mLAD 99 thrombotic, ostD1 70, p-mLCx 30, pRCA-1 50 calcified, pRCA-2 99 calcified, dRCA 30. Successful PCI/DES to p-mLAD x 2  . Cancer ([employer])    skin  . CKD (chronic kidney disease), stage II   . Glaucoma   . History of kidney stones   . Hyperlipidemia   . Hypertension   . Insulin dependent diabetes mellitus   . Ischemic cardiomyopathy    a. TTE 08/10/17: technically difficult study, no diagnostic RWMA, EF 40-50%, poor windows, very difficult study   . Myocardial infarction ([place])   . Neuropathy   . S/P angioplasty with stent 08/10/17 emergently to LAD ,  08/13/17 atherectomy and stent to pRCA  08/14/2017    Patient Active Problem List   Diagnosis Date Noted  . S/P angioplasty with stent 08/10/17 emergently to LAD ,  08/13/17 atherectomy and stent to [facility]  08/14/2017  . CAD in native artery 08/12/2017  . Leukocytosis 08/12/2017  . Insulin dependent diabetes mellitus 08/12/2017  . NSTEMI (non-ST elevated myocardial infarction) ([place]) 08/12/2017  . Acute ST elevation myocardial infarction (STEMI) involving left anterior descending (LAD)  coronary artery ([place]) 08/10/2017  . ST elevation myocardial infarction involving left anterior descending (LAD) coronary artery ([place])   . CAP (community acquired pneumonia) 01/25/2016  . Diabetes ([place]) 01/25/2016  . HTN (hypertension) 01/25/2016  . HLD (hyperlipidemia) 01/25/2016    Past Surgical History:  Procedure Laterality Date  . CARDIAC CATHETERIZATION    . COLONOSCOPY WITH PROPOFOL N/A 12/08/2014   Procedure: COLONOSCOPY WITH PROPOFOL;  Surgeon: Manya Silvas, MD;  Location: [facility] ENDOSCOPY;  Service: Endoscopy;  Laterality: N/A;  . COLONOSCOPY WITH PROPOFOL N/A 01/10/2016   Procedure: COLONOSCOPY WITH PROPOFOL;  Surgeon: Manya Silvas, MD;  Location: [facility] ENDOSCOPY;  Service: Endoscopy;  Laterality: N/A;  . COLONOSCOPY WITH PROPOFOL N/A 04/28/2019   Procedure: COLONOSCOPY WITH PROPOFOL;  Surgeon: Robert Bellow, MD;  Location: [facility] ENDOSCOPY;  Service: Endoscopy;  Laterality: N/A;  . CORONARY ATHERECTOMY N/A 08/13/2017   Procedure: CORONARY ATHERECTOMY;  Surgeon: Wellington Hampshire, MD;  Location: [place] CV LAB;  Service: Cardiovascular;  Laterality: N/A;  . CORONARY STENT INTERVENTION N/A 08/13/2017   Procedure: CORONARY STENT INTERVENTION;  Surgeon: Wellington Hampshire, MD;  Location: [place] CV LAB;  Service: Cardiovascular;  Laterality: N/A;  . CORONARY/GRAFT ACUTE MI REVASCULARIZATION N/A 08/10/2017   Procedure: Coronary/Graft Acute MI Revascularization;  Surgeon: Burnell Blanks, MD;  Location: [place] CV LAB;  Service: Cardiovascular;  Laterality: N/A;  . EXTRACORPOREAL SHOCK WAVE LITHOTRIPSY  Right 11/09/2015   Procedure: EXTRACORPOREAL SHOCK WAVE LITHOTRIPSY (ESWL);  Surgeon: Royston Cowper, MD;  Location: [facility] ORS;  Service: Urology;  Laterality: Right;  . EXTRACORPOREAL SHOCK WAVE LITHOTRIPSY Left 10/17/2016   Procedure: EXTRACORPOREAL SHOCK WAVE LITHOTRIPSY (ESWL);  Surgeon: Royston Cowper, MD;  Location: [facility] ORS;  Service: Urology;  Laterality: Left;   . LEFT HEART CATH AND CORONARY ANGIOGRAPHY N/A 08/10/2017   Procedure: LEFT HEART CATH AND CORONARY ANGIOGRAPHY;  Surgeon: Burnell Blanks, MD;  Location: [place] CV LAB;  Service: Cardiovascular;  Laterality: N/A;  . LITHOTRIPSY     x 6  . PAROTIDECTOMY  2015  . TEMPORARY PACEMAKER N/A 08/13/2017   Procedure: TEMPORARY PACEMAKER;  Surgeon: Wellington Hampshire, MD;  Location: [place] CV LAB;  Service: Cardiovascular;  Laterality: N/A;  . TONSILLECTOMY         Family History  Problem Relation Age of Onset  . Heart attack Mother   . Melanoma Father   . Parkinsonism Father     Social History   Tobacco Use  . Smoking status: Former Smoker    Packs/day: 1.00    Years: 50.00    Pack years: 50.00    Types: Cigarettes    Quit date: 08/2017    Years since quitting: 1.8  . Smokeless tobacco: Never Used  . Tobacco comment: Has Quit as of May 2019- reviewed relaspe and will continue to followup  Substance Use Topics  . Alcohol use: No  . Drug use: No    Home Medications Prior to Admission medications   Medication Sig Start Date End Date Taking? Authorizing Provider  acetaminophen (TYLENOL) 325 MG tablet Take 2 tablets (650 mg total) by mouth every 6 (six) hours as needed for mild pain (or Fever >/= 101). 01/27/16   Gouru, Illene Silver, MD  albuterol (PROVENTIL HFA;VENTOLIN HFA) 108 (90 Base) MCG/ACT inhaler Inhale 2 puffs into the lungs every 6 (six) hours as needed for wheezing or shortness of breath. 01/27/16   Nicholes Mango, MD  amoxicillin-clavulanate (AUGMENTIN) 875-125 MG tablet Take 1 tablet by mouth 2 (two) times daily. Patient not taking: Reported on 04/28/2019 12/26/18   Carrie Mew, MD  aspirin EC 81 MG EC tablet Take 1 tablet (81 mg total) by mouth daily. 08/15/17   Isaiah Serge, NP  atorvastatin (LIPITOR) 80 MG tablet Take 1 tablet (80 mg total) by mouth daily at 6 PM. 08/14/17   Isaiah Serge, NP  cetirizine (ZYRTEC) 10 MG tablet Take 10 mg by mouth daily.     [provider]  fluticasone (FLONASE) 50 MCG/ACT nasal spray Place 1 spray into both nostrils daily.    [provider]  HYDROcodone-acetaminophen (NORCO/VICODIN) 5-325 MG tablet Take 1 tablet by mouth every 6 (six) hours as needed. Pt to take twice a day as needed.    [provider]  insulin detemir (LEVEMIR) 100 UNIT/ML injection Inject 0.5 mLs (50 Units total) into the skin 2 (two) times daily. 08/12/17   Gladstone Lighter, MD  irbesartan (AVAPRO) 300 MG tablet TAKE ONE TABLET EVERY DAY 06/05/18   Isaiah Serge, NP  loratadine (CLARITIN) 10 MG tablet Take 1 tablet (10 mg total) by mouth daily as needed for allergies. 08/12/17   Gladstone Lighter, MD  metoprolol tartrate (LOPRESSOR) 25 MG tablet Take 1 tablet (25 mg total) by mouth 2 (two) times daily. 08/14/17   Isaiah Serge, NP  montelukast (SINGULAIR) 10 MG tablet Take 1 tablet by mouth daily. 07/10/17  [provider]  multivitamin-iron-minerals-folic acid (CENTRUM) chewable tablet Chew 1 tablet by mouth daily.    [provider]  nicotine (NICODERM CQ - DOSED IN MG/24 HOURS) 14 mg/24hr patch Place 1 patch (14 mg total) onto the skin daily. Patient taking differently: Place 7 mg onto the skin daily.  08/14/17   Isaiah Serge, NP  nitroGLYCERIN (NITROSTAT) 0.4 MG SL tablet Place 1 tablet (0.4 mg total) under the tongue every 5 (five) minutes as needed for chest pain. 08/14/17   Isaiah Serge, NP  ondansetron (ZOFRAN ODT) 4 MG disintegrating tablet Take 1 tablet (4 mg total) by mouth every 8 (eight) hours as needed for nausea or vomiting. 12/26/18   Carrie Mew, MD  sitaGLIPtin-metformin (JANUMET) 50-1000 MG tablet Take 1 tablet by mouth 2 (two) times daily with a meal.    [provider]  ticagrelor (BRILINTA) 90 MG TABS tablet Take 1 tablet (90 mg total) by mouth 2 (two) times daily. 08/14/17   Isaiah Serge, NP    Allergies    Patient has no known allergies.  Review of Systems    Review of Systems  Respiratory: Positive for shortness of breath.   All other systems reviewed and are negative.   Physical Exam Updated Vital Signs BP (!) 159/72   Pulse 98   Temp 98.5 F (36.9 C)   Resp 18   Ht 5\' 11"  (1.803 m)   Wt 98.9 kg   SpO2 95%   BMI 30.40 kg/m   Physical Exam Vitals and nursing note reviewed.  Constitutional:      Appearance: He is well-developed.  HENT:     Head: Normocephalic.     Mouth/Throat:     Mouth: Mucous membranes are moist.  Eyes:     Pupils: Pupils are equal, round, and reactive to light.  Cardiovascular:     Rate and Rhythm: Normal rate and regular rhythm.  Pulmonary:     Comments: Slightly tachypneic, minimal wheezing throughout, no retractions  Abdominal:     General: Bowel sounds are normal.     Palpations: Abdomen is soft.  Musculoskeletal:        General: Normal range of motion.     Cervical back: Normal range of motion and neck supple.  Skin:    General: Skin is warm.     Capillary Refill: Capillary refill takes less than 2 seconds.  Neurological:     General: No focal deficit present.     Mental Status: He is alert and oriented to person, place, and time.  Psychiatric:        Mood and Affect: Mood normal.        Behavior: Behavior normal.     ED Results / Procedures / Treatments   Labs (all labs ordered are listed, but only abnormal results are displayed) Labs Reviewed  CBC  BASIC METABOLIC PANEL  TROPONIN I (HIGH SENSITIVITY)  TROPONIN I (HIGH SENSITIVITY)    EKG EKG Interpretation  Date/Time:  Saturday June 26 2019 20:09:16 EDT Ventricular Rate:  77 PR Interval:  144 QRS Duration: 90 QT Interval:  374 QTC Calculation: 423 R Axis:   68 Text Interpretation: Sinus rhythm with Premature atrial complexes Nonspecific ST abnormality Abnormal ECG When compared with ECG of 14-Nov-2018 17:05, No significant change was found Confirmed by Wandra Arthurs [phone]) on 06/26/2019 9:18:00 PM   Radiology DG Chest  2 View  Result Date: 06/26/2019 CLINICAL DATA:  Shortness of breath EXAM: CHEST - 2  VIEW COMPARISON:  11/14/2018 FINDINGS: There is hyperinflation of the lungs compatible with COPD. No confluent opacities or effusions. No acute bony abnormality. IMPRESSION: COPD.  No active disease. Electronically Signed   By: Rolm Baptise M.D.   On: 06/26/2019 20:51    Procedures Procedures (including critical care time)  Medications Ordered in ED Medications  predniSONE (DELTASONE) tablet 60 mg (has no administration in time range)    ED Course  I have reviewed the triage vital signs and the nursing notes.  Pertinent labs & imaging results that were available during my care of the patient were reviewed by me and considered in my medical decision making (see chart for details).    MDM Rules/Calculators/A&P                      Henry [name] is a 74 y.o. male who presented with shortness of breath and wheezing.  Likely COPD exacerbation.  Previous just got his second Covid shot.  He has no chest pain but does have a history of CAD.  Will get labs and chest x-ray. Trop sent in triage but has no chest pain and I doubt ACS   10:31 PM Labs and Trop negative. No wheezing after prednisone. Likely mild COPD exacerbation. Not hypoxic. Will dc home.   Final Clinical Impression(s) / ED Diagnoses Final diagnoses:  None    Rx / DC Orders ED Discharge Orders    None       Drenda Freeze, MD 06/26/19 2231

## 2019-06-28 ENCOUNTER — Other Ambulatory Visit: Payer: Self-pay | Admitting: Cardiology

## 2019-06-29 DIAGNOSIS — M79642 Pain in left hand: Secondary | ICD-10-CM | POA: Diagnosis not present

## 2019-06-29 DIAGNOSIS — E1129 Type 2 diabetes mellitus with other diabetic kidney complication: Secondary | ICD-10-CM | POA: Diagnosis not present

## 2019-06-29 DIAGNOSIS — E1165 Type 2 diabetes mellitus with hyperglycemia: Secondary | ICD-10-CM | POA: Diagnosis not present

## 2019-06-29 DIAGNOSIS — R200 Anesthesia of skin: Secondary | ICD-10-CM | POA: Diagnosis not present

## 2019-06-29 DIAGNOSIS — G479 Sleep disorder, unspecified: Secondary | ICD-10-CM | POA: Diagnosis not present

## 2019-06-29 DIAGNOSIS — M79641 Pain in right hand: Secondary | ICD-10-CM | POA: Diagnosis not present

## 2019-06-29 DIAGNOSIS — R202 Paresthesia of skin: Secondary | ICD-10-CM | POA: Diagnosis not present

## 2019-06-29 DIAGNOSIS — Z87891 Personal history of nicotine dependence: Secondary | ICD-10-CM | POA: Diagnosis not present

## 2019-06-29 DIAGNOSIS — Z794 Long term (current) use of insulin: Secondary | ICD-10-CM | POA: Diagnosis not present

## 2019-06-29 DIAGNOSIS — M25561 Pain in right knee: Secondary | ICD-10-CM | POA: Diagnosis not present

## 2019-06-29 DIAGNOSIS — G8929 Other chronic pain: Secondary | ICD-10-CM | POA: Diagnosis not present

## 2019-06-29 DIAGNOSIS — J449 Chronic obstructive pulmonary disease, unspecified: Secondary | ICD-10-CM | POA: Diagnosis not present

## 2019-06-29 DIAGNOSIS — M25562 Pain in left knee: Secondary | ICD-10-CM | POA: Diagnosis not present

## 2019-07-12 DIAGNOSIS — B9689 Other specified bacterial agents as the cause of diseases classified elsewhere: Secondary | ICD-10-CM | POA: Diagnosis not present

## 2019-07-12 DIAGNOSIS — J019 Acute sinusitis, unspecified: Secondary | ICD-10-CM | POA: Diagnosis not present

## 2019-07-12 DIAGNOSIS — J209 Acute bronchitis, unspecified: Secondary | ICD-10-CM | POA: Diagnosis not present

## 2019-07-12 DIAGNOSIS — J441 Chronic obstructive pulmonary disease with (acute) exacerbation: Secondary | ICD-10-CM | POA: Diagnosis not present

## 2019-08-09 DIAGNOSIS — I2510 Atherosclerotic heart disease of native coronary artery without angina pectoris: Secondary | ICD-10-CM | POA: Diagnosis not present

## 2019-08-09 DIAGNOSIS — E785 Hyperlipidemia, unspecified: Secondary | ICD-10-CM | POA: Diagnosis not present

## 2019-08-09 DIAGNOSIS — J449 Chronic obstructive pulmonary disease, unspecified: Secondary | ICD-10-CM | POA: Diagnosis not present

## 2019-08-09 DIAGNOSIS — E1140 Type 2 diabetes mellitus with diabetic neuropathy, unspecified: Secondary | ICD-10-CM | POA: Diagnosis not present

## 2019-08-09 DIAGNOSIS — I10 Essential (primary) hypertension: Secondary | ICD-10-CM | POA: Diagnosis not present

## 2019-08-09 DIAGNOSIS — Z794 Long term (current) use of insulin: Secondary | ICD-10-CM | POA: Diagnosis not present

## 2019-08-11 DIAGNOSIS — M79641 Pain in right hand: Secondary | ICD-10-CM | POA: Diagnosis not present

## 2019-08-11 DIAGNOSIS — R200 Anesthesia of skin: Secondary | ICD-10-CM | POA: Diagnosis not present

## 2019-08-11 DIAGNOSIS — R202 Paresthesia of skin: Secondary | ICD-10-CM | POA: Diagnosis not present

## 2019-08-31 DIAGNOSIS — I2109 ST elevation (STEMI) myocardial infarction involving other coronary artery of anterior wall: Secondary | ICD-10-CM | POA: Diagnosis not present

## 2019-08-31 DIAGNOSIS — I10 Essential (primary) hypertension: Secondary | ICD-10-CM | POA: Diagnosis not present

## 2019-08-31 DIAGNOSIS — Z955 Presence of coronary angioplasty implant and graft: Secondary | ICD-10-CM | POA: Diagnosis not present

## 2019-08-31 DIAGNOSIS — I2510 Atherosclerotic heart disease of native coronary artery without angina pectoris: Secondary | ICD-10-CM | POA: Diagnosis not present

## 2019-09-01 ENCOUNTER — Emergency Department: Payer: Medicare HMO

## 2019-09-01 ENCOUNTER — Encounter: Payer: Self-pay | Admitting: Emergency Medicine

## 2019-09-01 ENCOUNTER — Emergency Department
Admission: EM | Admit: 2019-09-01 | Discharge: 2019-09-01 | Disposition: A | Discharge status: Home / Self Care | Payer: Medicare HMO | Attending: Emergency Medicine | Admitting: Emergency Medicine

## 2019-09-01 ENCOUNTER — Other Ambulatory Visit: Payer: Self-pay

## 2019-09-01 DIAGNOSIS — Z79899 Other long term (current) drug therapy: Secondary | ICD-10-CM | POA: Insufficient documentation

## 2019-09-01 DIAGNOSIS — I129 Hypertensive chronic kidney disease with stage 1 through stage 4 chronic kidney disease, or unspecified chronic kidney disease: Secondary | ICD-10-CM | POA: Diagnosis not present

## 2019-09-01 DIAGNOSIS — I2510 Atherosclerotic heart disease of native coronary artery without angina pectoris: Secondary | ICD-10-CM | POA: Diagnosis not present

## 2019-09-01 DIAGNOSIS — N182 Chronic kidney disease, stage 2 (mild): Secondary | ICD-10-CM | POA: Insufficient documentation

## 2019-09-01 DIAGNOSIS — R0602 Shortness of breath: Secondary | ICD-10-CM | POA: Insufficient documentation

## 2019-09-01 DIAGNOSIS — Z87891 Personal history of nicotine dependence: Secondary | ICD-10-CM | POA: Diagnosis not present

## 2019-09-01 DIAGNOSIS — E1122 Type 2 diabetes mellitus with diabetic chronic kidney disease: Secondary | ICD-10-CM | POA: Diagnosis not present

## 2019-09-01 DIAGNOSIS — Z955 Presence of coronary angioplasty implant and graft: Secondary | ICD-10-CM | POA: Insufficient documentation

## 2019-09-01 DIAGNOSIS — I252 Old myocardial infarction: Secondary | ICD-10-CM | POA: Diagnosis not present

## 2019-09-01 DIAGNOSIS — R0600 Dyspnea, unspecified: Secondary | ICD-10-CM | POA: Diagnosis not present

## 2019-09-01 DIAGNOSIS — Z950 Presence of cardiac pacemaker: Secondary | ICD-10-CM | POA: Diagnosis not present

## 2019-09-01 DIAGNOSIS — G629 Polyneuropathy, unspecified: Secondary | ICD-10-CM | POA: Insufficient documentation

## 2019-09-01 DIAGNOSIS — Z794 Long term (current) use of insulin: Secondary | ICD-10-CM | POA: Diagnosis not present

## 2019-09-01 DIAGNOSIS — Z7982 Long term (current) use of aspirin: Secondary | ICD-10-CM | POA: Insufficient documentation

## 2019-09-01 LAB — CBC WITH DIFFERENTIAL/PLATELET
Abs Immature Granulocytes: 0.04 10*3/uL (ref 0.00–0.07)
Basophils Absolute: 0.1 10*3/uL (ref 0.0–0.1)
Basophils Relative: 1 %
Eosinophils Absolute: 0.4 10*3/uL (ref 0.0–0.5)
Eosinophils Relative: 4 %
HCT: 44 % (ref 39.0–52.0)
Hemoglobin: 14.2 g/dL (ref 13.0–17.0)
Immature Granulocytes: 0 %
Lymphocytes Relative: 16 %
Lymphs Abs: 1.7 10*3/uL (ref 0.7–4.0)
MCH: 26.2 pg (ref 26.0–34.0)
MCHC: 32.3 g/dL (ref 30.0–36.0)
MCV: 81 fL (ref 80.0–100.0)
Monocytes Absolute: 1.1 10*3/uL — ABNORMAL HIGH (ref 0.1–1.0)
Monocytes Relative: 11 %
Neutro Abs: 7 10*3/uL (ref 1.7–7.7)
Neutrophils Relative %: 68 %
Platelets: 347 10*3/uL (ref 150–400)
RBC: 5.43 MIL/uL (ref 4.22–5.81)
RDW: 16.1 % — ABNORMAL HIGH (ref 11.5–15.5)
WBC: 10.3 10*3/uL (ref 4.0–10.5)
nRBC: 0 % (ref 0.0–0.2)

## 2019-09-01 LAB — COMPREHENSIVE METABOLIC PANEL
ALT: 29 U/L (ref 0–44)
AST: 30 U/L (ref 15–41)
Albumin: 4.5 g/dL (ref 3.5–5.0)
Alkaline Phosphatase: 79 U/L (ref 38–126)
Anion gap: 10 (ref 5–15)
BUN: 22 mg/dL (ref 8–23)
CO2: 28 mmol/L (ref 22–32)
Calcium: 10.7 mg/dL — ABNORMAL HIGH (ref 8.9–10.3)
Chloride: 99 mmol/L (ref 98–111)
Creatinine, Ser: 1.26 mg/dL — ABNORMAL HIGH (ref 0.61–1.24)
GFR calc Af Amer: >60 mL/min (ref >60)
GFR calc non Af Amer: 56 mL/min — ABNORMAL LOW (ref >60)
Glucose, Bld: 181 mg/dL — ABNORMAL HIGH (ref 70–99)
Potassium: 4.2 mmol/L (ref 3.5–5.1)
Sodium: 137 mmol/L (ref 135–145)
Total Bilirubin: 0.6 mg/dL (ref 0.3–1.2)
Total Protein: 7.7 g/dL (ref 6.5–8.1)

## 2019-09-01 LAB — TROPONIN I (HIGH SENSITIVITY)
Troponin I (High Sensitivity): 5 ng/L (ref <18)
Troponin I (High Sensitivity): 4 ng/L (ref <18)

## 2019-09-01 MED ORDER — IOHEXOL 350 MG/ML SOLN
75.0000 mL | Freq: Once | INTRAVENOUS | Status: AC | PRN
  Administered 2019-09-01: 75 mL via INTRAVENOUS

## 2019-09-01 NOTE — ED Notes (Signed)
Pt verbalized discharge instructions and has no questions at this time 

## 2019-09-01 NOTE — ED Triage Notes (Signed)
Patient ambulatory to triage with steady gait, without difficulty or distress noted, mask in place; pt reports [facility] with exertion; denies any recent illness, denies cough; also st has neuropathy and has been unable to sleep for last wk

## 2019-09-01 NOTE — ED Provider Notes (Signed)
[facility] Emergency Department Provider Note  ____________________________________________   None    (approximate)  I have reviewed the triage vital signs and the nursing notes.   HISTORY  Chief Complaint Shortness of Breath    HPI Henry [name] is a 74 y.o. male with coronary disease status post stents in 2019, CKD, ischemic cardiomyopathy with EF 40 to 50% comes in with shortness of breath.  Patient stated that he woke up this morning around 2-3 AM with some shortness of breath.  He still has some mild shortness of breath at this time, constant, nothing makes it better, nothing makes it worse.  Denies any chest pain.  States that he just had follow-up with his cardiologist yesterday and had been doing well from a cardiac standpoint.  He is a second complaint is that he has severe neuropathy.  He has been on gabapentin and chronic pain medicine.  He states that he is having difficulty sleeping.  He states that he wakes up in the middle the night.           Past Medical History:  Diagnosis Date  . Arthritis   . CAD (coronary artery disease)    a. anterior stemi 08/10/2017; b. LHC 08/10/17: pLAD 70, p-mLAD 99 thrombotic, ostD1 70, p-mLCx 30, pRCA-1 50 calcified, pRCA-2 99 calcified, dRCA 30. Successful PCI/DES to p-mLAD x 2  . Cancer ([place])    skin  . CKD (chronic kidney disease), stage II   . Glaucoma   . History of kidney stones   . Hyperlipidemia   . Hypertension   . Insulin dependent diabetes mellitus   . Ischemic cardiomyopathy    a. TTE 08/10/17: technically difficult study, no diagnostic RWMA, EF 40-50%, poor windows, very difficult study   . Myocardial infarction ([place])   . Neuropathy   . S/P angioplasty with stent 08/10/17 emergently to LAD ,  08/13/17 atherectomy and stent to pRCA  08/14/2017    Patient Active Problem List   Diagnosis Date Noted  . S/P angioplasty with stent 08/10/17 emergently to LAD ,  08/13/17 atherectomy and stent to [facility]   08/14/2017  . CAD in native artery 08/12/2017  . Leukocytosis 08/12/2017  . Insulin dependent diabetes mellitus 08/12/2017  . NSTEMI (non-ST elevated myocardial infarction) ([place]) 08/12/2017  . Acute ST elevation myocardial infarction (STEMI) involving left anterior descending (LAD) coronary artery ([place]) 08/10/2017  . ST elevation myocardial infarction involving left anterior descending (LAD) coronary artery ([place])   . CAP (community acquired pneumonia) 01/25/2016  . Diabetes ([place]) 01/25/2016  . HTN (hypertension) 01/25/2016  . HLD (hyperlipidemia) 01/25/2016    Past Surgical History:  Procedure Laterality Date  . CARDIAC CATHETERIZATION    . COLONOSCOPY WITH PROPOFOL N/A 12/08/2014   Procedure: COLONOSCOPY WITH PROPOFOL;  Surgeon: Manya Silvas, MD;  Location: [facility] ENDOSCOPY;  Service: Endoscopy;  Laterality: N/A;  . COLONOSCOPY WITH PROPOFOL N/A 01/10/2016   Procedure: COLONOSCOPY WITH PROPOFOL;  Surgeon: Manya Silvas, MD;  Location: [facility] ENDOSCOPY;  Service: Endoscopy;  Laterality: N/A;  . COLONOSCOPY WITH PROPOFOL N/A 04/28/2019   Procedure: COLONOSCOPY WITH PROPOFOL;  Surgeon: Robert Bellow, MD;  Location: [facility] ENDOSCOPY;  Service: Endoscopy;  Laterality: N/A;  . CORONARY ATHERECTOMY N/A 08/13/2017   Procedure: CORONARY ATHERECTOMY;  Surgeon: Wellington Hampshire, MD;  Location: [place] CV LAB;  Service: Cardiovascular;  Laterality: N/A;  . CORONARY STENT INTERVENTION N/A 08/13/2017   Procedure: CORONARY STENT INTERVENTION;  Surgeon: Wellington Hampshire, MD;  Location: [place] CV LAB;  Service: Cardiovascular;  Laterality: N/A;  . CORONARY/GRAFT ACUTE MI REVASCULARIZATION N/A 08/10/2017   Procedure: Coronary/Graft Acute MI Revascularization;  Surgeon: Burnell Blanks, MD;  Location: [place] CV LAB;  Service: Cardiovascular;  Laterality: N/A;  . EXTRACORPOREAL SHOCK WAVE LITHOTRIPSY Right 11/09/2015   Procedure: EXTRACORPOREAL SHOCK WAVE LITHOTRIPSY (ESWL);  Surgeon:  Royston Cowper, MD;  Location: [facility] ORS;  Service: Urology;  Laterality: Right;  . EXTRACORPOREAL SHOCK WAVE LITHOTRIPSY Left 10/17/2016   Procedure: EXTRACORPOREAL SHOCK WAVE LITHOTRIPSY (ESWL);  Surgeon: Royston Cowper, MD;  Location: [facility] ORS;  Service: Urology;  Laterality: Left;  . LEFT HEART CATH AND CORONARY ANGIOGRAPHY N/A 08/10/2017   Procedure: LEFT HEART CATH AND CORONARY ANGIOGRAPHY;  Surgeon: Burnell Blanks, MD;  Location: [place] CV LAB;  Service: Cardiovascular;  Laterality: N/A;  . LITHOTRIPSY     x 6  . PAROTIDECTOMY  2015  . TEMPORARY PACEMAKER N/A 08/13/2017   Procedure: TEMPORARY PACEMAKER;  Surgeon: Wellington Hampshire, MD;  Location: [place] CV LAB;  Service: Cardiovascular;  Laterality: N/A;  . TONSILLECTOMY      Prior to Admission medications   Medication Sig Start Date End Date Taking? Authorizing Provider  acetaminophen (TYLENOL) 325 MG tablet Take 2 tablets (650 mg total) by mouth every 6 (six) hours as needed for mild pain (or Fever >/= 101). 01/27/16   Gouru, Illene Silver, MD  albuterol (PROVENTIL HFA;VENTOLIN HFA) 108 (90 Base) MCG/ACT inhaler Inhale 2 puffs into the lungs every 6 (six) hours as needed for wheezing or shortness of breath. 01/27/16   Nicholes Mango, MD  amoxicillin-clavulanate (AUGMENTIN) 875-125 MG tablet Take 1 tablet by mouth 2 (two) times daily. Patient not taking: Reported on 04/28/2019 12/26/18   Carrie Mew, MD  aspirin EC 81 MG EC tablet Take 1 tablet (81 mg total) by mouth daily. 08/15/17   Isaiah Serge, NP  atorvastatin (LIPITOR) 80 MG tablet Take 1 tablet (80 mg total) by mouth daily at 6 PM. 08/14/17   Isaiah Serge, NP  cetirizine (ZYRTEC) 10 MG tablet Take 10 mg by mouth daily.    [provider]  fluticasone (FLONASE) 50 MCG/ACT nasal spray Place 1 spray into both nostrils daily.    [provider]  HYDROcodone-acetaminophen (NORCO/VICODIN) 5-325 MG tablet Take 1 tablet by mouth every 6 (six) hours as  needed. Pt to take twice a day as needed.    [provider]  insulin detemir (LEVEMIR) 100 UNIT/ML injection Inject 0.5 mLs (50 Units total) into the skin 2 (two) times daily. 08/12/17   Gladstone Lighter, MD  irbesartan (AVAPRO) 300 MG tablet TAKE ONE TABLET EVERY DAY 06/05/18   Isaiah Serge, NP  loratadine (CLARITIN) 10 MG tablet Take 1 tablet (10 mg total) by mouth daily as needed for allergies. 08/12/17   Gladstone Lighter, MD  metoprolol tartrate (LOPRESSOR) 25 MG tablet Take 1 tablet (25 mg total) by mouth 2 (two) times daily. 08/14/17   Isaiah Serge, NP  montelukast (SINGULAIR) 10 MG tablet Take 1 tablet by mouth daily. 07/10/17   [provider]  multivitamin-iron-minerals-folic acid (CENTRUM) chewable tablet Chew 1 tablet by mouth daily.    [provider]  nicotine (NICODERM CQ - DOSED IN MG/24 HOURS) 14 mg/24hr patch Place 1 patch (14 mg total) onto the skin daily. Patient taking differently: Place 7 mg onto the skin daily.  08/14/17   Isaiah Serge, NP  nitroGLYCERIN (NITROSTAT) 0.4 MG  SL tablet PLACE 1 TABLET UNDER TONGUE EVERY 5 MIN AS NEEDED FOR CHEST PAIN IF NO RELIEF IN15 MIN CALL 911 (MAX 3 TABS) 06/29/19   Isaiah Serge, NP  ondansetron (ZOFRAN ODT) 4 MG disintegrating tablet Take 1 tablet (4 mg total) by mouth every 8 (eight) hours as needed for nausea or vomiting. 12/26/18   Carrie Mew, MD  predniSONE (DELTASONE) 20 MG tablet Take 60 mg daily x 2 days then 40 mg daily x 2 days then 20 mg daily x 2 days 06/26/19   Drenda Freeze, MD  sitaGLIPtin-metformin (JANUMET) 50-1000 MG tablet Take 1 tablet by mouth 2 (two) times daily with a meal.    [provider]  ticagrelor (BRILINTA) 90 MG TABS tablet Take 1 tablet (90 mg total) by mouth 2 (two) times daily. 08/14/17   Isaiah Serge, NP    Allergies Patient has no known allergies.  Family History  Problem Relation Age of Onset  . Heart attack Mother   . Melanoma Father   .  Parkinsonism Father     Social History Social History   Tobacco Use  . Smoking status: Former Smoker    Packs/day: 1.00    Years: 50.00    Pack years: 50.00    Types: Cigarettes    Quit date: 08/2017    Years since quitting: 2.0  . Smokeless tobacco: Never Used  . Tobacco comment: Has Quit as of May 2019- reviewed relaspe and will continue to followup  Substance Use Topics  . Alcohol use: No  . Drug use: No      Review of Systems Constitutional: No fever/chills Eyes: No visual changes. ENT: No sore throat. Cardiovascular: No chest pain Respiratory: Positive for SOB Gastrointestinal: No abdominal pain.  No nausea, no vomiting.  No diarrhea.  No constipation. Genitourinary: Negative for dysuria. Musculoskeletal: Negative for back pain.  Chronic neuropathy Skin: Negative for rash. Neurological: Negative for headaches, focal weakness or numbness.  Difficulty sleeping All other ROS negative ____________________________________________   PHYSICAL EXAM:  VITAL SIGNS: ED Triage Vitals  Enc Vitals Group     BP 09/01/19 0537 (!) 159/77     Pulse Rate 09/01/19 0537 87     Resp 09/01/19 0537 (!) 22     Temp 09/01/19 0537 97.7 F (36.5 C)     Temp Source 09/01/19 0537 Oral     SpO2 09/01/19 0537 97 %     Weight 09/01/19 0539 220 lb (99.8 kg)     Height 09/01/19 0539 6' (1.829 m)     Head Circumference --      Peak Flow --      Pain Score 09/01/19 0538 0     Pain Loc --      Pain Edu? --      Excl. in [place]? --     Constitutional: Alert and oriented. Well appearing and in no acute distress. Eyes: Conjunctivae are normal. EOMI. Head: Atraumatic. Nose: No congestion/rhinnorhea. Mouth/Throat: Mucous membranes are moist.   Neck: No stridor. Trachea Midline. FROM Cardiovascular: Normal rate, regular rhythm. Grossly normal heart sounds.  Good peripheral circulation. Respiratory: Clear lungs, no obvious increased work of breathing Gastrointestinal: Soft and nontender. No  distention. No abdominal bruits.  Musculoskeletal: No lower extremity tenderness nor edema.  No joint effusions. Neurologic:  Normal speech and language. No gross focal neurologic deficits are appreciated.  Skin:  Skin is warm, dry and intact. No rash noted. Psychiatric: Mood and affect are normal. Speech and  behavior are normal. GU: Deferred   ____________________________________________   LABS (all labs ordered are listed, but only abnormal results are displayed)  Labs Reviewed  CBC WITH DIFFERENTIAL/PLATELET - Abnormal; Notable for the following components:      Result Value   RDW 16.1 (*)    Monocytes Absolute 1.1 (*)    All other components within normal limits  COMPREHENSIVE METABOLIC PANEL - Abnormal; Notable for the following components:   Glucose, Bld 181 (*)    Creatinine, Ser 1.26 (*)    Calcium 10.7 (*)    GFR calc non Af Amer 56 (*)    All other components within normal limits  TROPONIN I (HIGH SENSITIVITY)  TROPONIN I (HIGH SENSITIVITY)  TROPONIN I (HIGH SENSITIVITY)   ____________________________________________   ED ECG REPORT I, Vanessa [place], the attending physician, personally viewed and interpreted this ECG.  EKG sinus rate of 73, no ST elevation, no T wave inversions, PAC ____________________________________________  RADIOLOGY I, Vanessa [place], personally viewed and evaluated these images (plain radiographs) as part of my medical decision making, as well as reviewing the written report by the radiologist.  ED MD interpretation:   Possible opacification the right midlung  Official radiology report(s): DG Chest 2 View  Result Date: 09/01/2019 CLINICAL DATA:  Shortness of breath EXAM: CHEST - 2 VIEW COMPARISON:  Radiograph 06/26/2019, CT 01/25/2016 FINDINGS: Chronic architectural distortion and bronchiectasis in the bilateral mid to lower lungs, right greater than left however. A more focal opacity is seen in the right mid lung, not present in March 2021  which could reflect developing consolidation upon these more chronic changes. Remaining portions of the lungs are grossly clear. No pneumothorax or effusion. Stable cardiomediastinal contours. No acute osseous or soft tissue abnormality. Degenerative changes are present in the imaged spine and shoulders. IMPRESSION: 1. Chronic architectural distortion and bronchiectasis in the bilateral mid to lower lungs, right greater than left. 2. More focal opacity in the right mid lung, not present in March 2021 which could reflect developing consolidation though follow-up imaging to ensure resolution is recommended. If infectious symptoms are not present, consider further evaluation with CT imaging to better delineate this finding. Electronically Signed   By: Lovena Le M.D.   On: 09/01/2019 06:06    ____________________________________________   PROCEDURES  Procedure(s) performed (including Critical Care):  Procedures   ____________________________________________   INITIAL IMPRESSION / ASSESSMENT AND PLAN / ED COURSE   Rodena Piety [place] was evaluated in Emergency Department on 09/01/2019 for the symptoms described in the history of present illness. He was evaluated in the context of the global COVID-19 pandemic, which necessitated consideration that the patient might be at risk for infection with the SARS-CoV-2 virus that causes COVID-19. Institutional protocols and algorithms that pertain to the evaluation of patients at risk for COVID-19 are in a state of rapid change based on information released by regulatory bodies including the CDC and federal and state organizations. These policies and algorithms were followed during the patient's care in the ED.     Pt presents with SOB. Differential includes: PNA-will get xray to evaluation Anemia-CBC to evaluate ACS- will get trops Arrhythmia-Will get EKG and keep on monitor.  COVID- will get testing per algorithm. PE-consider work-up otherwise  unrevealing  For patient's neuropathy.  Discussed with patient to talk to his neurologist who is following him and he may be able to just gabapentin.  Patient states what makes it feel the best is the oxycodone he gets from  the [place].  Discussed with patient I be unable to give prescription for this due to our rules for prescribing pain medicine for chronic pain.  Patient understands completely and stated he can follow-up with them.  We discussed solutions to not been out of sleep tonight and how this is the same to try given his other medications with some melatonin.  Patient denies trying this previously and is willing to give this a try.  Labs are otherwise reassuring, no anemia.  Kidney function at baseline.  If her troponin is reassuring.  Will get repeat.  Chest x-ray concerning for focal opacity in the right midlung.  Patient denies any symptoms of pneumonia.  We will proceed with CT PE to make sure no evidence of pulmonary embolism, infarct, pneumonia.  9:53 AM reevaluated patient.  No longer  any shortness of breath.  Patient feeling well.  CT imaging was negative.  Cardiac markers were negative x2.  Patient feels comfortable with discharge home at this time.  He will follow up with his cardiologist. Patient is going to try some melatonin for sleep follow-up with his primary care doctor this not working  I discussed the provisional nature of ED diagnosis, the treatment so far, the ongoing plan of care, follow up appointments and return precautions with the patient and any family or support people present. They expressed understanding and agreed with the plan, discharged home.      ____________________________________________   FINAL CLINICAL IMPRESSION(S) / ED DIAGNOSES   Final diagnoses:  SOB (shortness of breath)  Neuropathy     MEDICATIONS GIVEN DURING THIS VISIT:  Medications  iohexol (OMNIPAQUE) 350 MG/ML injection 75 mL (75 mLs Intravenous Contrast Given 09/01/19 [id number])      ED Discharge Orders    None       Note:  This document was prepared using Dragon voice recognition software and may include unintentional dictation errors.   Vanessa [place], MD 09/01/19 1029

## 2019-09-01 NOTE — Discharge Instructions (Addendum)
Your heart markers were negative.  The CT scan was negative.  You can take melatonin 3 mg up to 10 mg daily to help with sleep.  You should follow-up with your cardiologist if you have return of SOB.   Return to ER if you have worsening symptoms or any other concerns.   IMPRESSION:  1. No definite evidence of pulmonary embolus.  2. Coronary artery calcifications are noted suggesting coronary  artery disease.

## 2019-09-13 DIAGNOSIS — R3914 Feeling of incomplete bladder emptying: Secondary | ICD-10-CM | POA: Diagnosis not present

## 2019-09-13 DIAGNOSIS — I1310 Hypertensive heart and chronic kidney disease without heart failure, with stage 1 through stage 4 chronic kidney disease, or unspecified chronic kidney disease: Secondary | ICD-10-CM | POA: Diagnosis not present

## 2019-09-13 DIAGNOSIS — N401 Enlarged prostate with lower urinary tract symptoms: Secondary | ICD-10-CM | POA: Diagnosis not present

## 2019-09-28 DIAGNOSIS — M79641 Pain in right hand: Secondary | ICD-10-CM | POA: Diagnosis not present

## 2019-09-28 DIAGNOSIS — R200 Anesthesia of skin: Secondary | ICD-10-CM | POA: Diagnosis not present

## 2019-09-28 DIAGNOSIS — G8929 Other chronic pain: Secondary | ICD-10-CM | POA: Diagnosis not present

## 2019-09-28 DIAGNOSIS — M25561 Pain in right knee: Secondary | ICD-10-CM | POA: Diagnosis not present

## 2019-09-28 DIAGNOSIS — R202 Paresthesia of skin: Secondary | ICD-10-CM | POA: Diagnosis not present

## 2019-09-28 DIAGNOSIS — M25562 Pain in left knee: Secondary | ICD-10-CM | POA: Diagnosis not present

## 2019-09-28 DIAGNOSIS — M79642 Pain in left hand: Secondary | ICD-10-CM | POA: Diagnosis not present

## 2019-09-28 DIAGNOSIS — G479 Sleep disorder, unspecified: Secondary | ICD-10-CM | POA: Diagnosis not present

## 2019-09-29 DIAGNOSIS — E1165 Type 2 diabetes mellitus with hyperglycemia: Secondary | ICD-10-CM | POA: Diagnosis not present

## 2019-09-29 DIAGNOSIS — E1129 Type 2 diabetes mellitus with other diabetic kidney complication: Secondary | ICD-10-CM | POA: Diagnosis not present

## 2019-09-29 DIAGNOSIS — Z87891 Personal history of nicotine dependence: Secondary | ICD-10-CM | POA: Diagnosis not present

## 2019-09-29 DIAGNOSIS — M7989 Other specified soft tissue disorders: Secondary | ICD-10-CM | POA: Diagnosis not present

## 2019-09-29 DIAGNOSIS — Z794 Long term (current) use of insulin: Secondary | ICD-10-CM | POA: Diagnosis not present

## 2019-10-04 DIAGNOSIS — H40003 Preglaucoma, unspecified, bilateral: Secondary | ICD-10-CM | POA: Diagnosis not present

## 2019-10-20 ENCOUNTER — Other Ambulatory Visit
Admission: RE | Admit: 2019-10-20 | Discharge: 2019-10-20 | Disposition: A | Discharge status: Home / Self Care | Payer: Medicare HMO | Source: Ambulatory Visit | Attending: Family Medicine | Admitting: Family Medicine

## 2019-10-20 DIAGNOSIS — R899 Unspecified abnormal finding in specimens from other organs, systems and tissues: Secondary | ICD-10-CM | POA: Diagnosis not present

## 2019-10-20 LAB — FIBRIN DERIVATIVES D-DIMER (ARMC ONLY): Fibrin derivatives D-dimer (ARMC): 774.66 ng/mL (FEU) — ABNORMAL HIGH (ref 0.00–499.00)

## 2019-11-26 DIAGNOSIS — I252 Old myocardial infarction: Secondary | ICD-10-CM | POA: Diagnosis not present

## 2019-11-26 DIAGNOSIS — Z955 Presence of coronary angioplasty implant and graft: Secondary | ICD-10-CM | POA: Diagnosis not present

## 2019-11-26 DIAGNOSIS — Z86010 Personal history of colonic polyps: Secondary | ICD-10-CM | POA: Diagnosis not present

## 2019-12-06 DIAGNOSIS — R609 Edema, unspecified: Secondary | ICD-10-CM | POA: Diagnosis not present

## 2019-12-06 DIAGNOSIS — R0602 Shortness of breath: Secondary | ICD-10-CM | POA: Diagnosis not present

## 2019-12-06 DIAGNOSIS — I491 Atrial premature depolarization: Secondary | ICD-10-CM | POA: Diagnosis not present

## 2019-12-06 DIAGNOSIS — R002 Palpitations: Secondary | ICD-10-CM | POA: Diagnosis not present

## 2019-12-06 DIAGNOSIS — Z955 Presence of coronary angioplasty implant and graft: Secondary | ICD-10-CM | POA: Diagnosis not present

## 2019-12-06 DIAGNOSIS — I25118 Atherosclerotic heart disease of native coronary artery with other forms of angina pectoris: Secondary | ICD-10-CM | POA: Diagnosis not present

## 2019-12-06 DIAGNOSIS — R079 Chest pain, unspecified: Secondary | ICD-10-CM | POA: Diagnosis not present

## 2019-12-06 DIAGNOSIS — I10 Essential (primary) hypertension: Secondary | ICD-10-CM | POA: Diagnosis not present

## 2019-12-15 DIAGNOSIS — M25562 Pain in left knee: Secondary | ICD-10-CM | POA: Diagnosis not present

## 2019-12-15 DIAGNOSIS — G479 Sleep disorder, unspecified: Secondary | ICD-10-CM | POA: Diagnosis not present

## 2019-12-15 DIAGNOSIS — G8929 Other chronic pain: Secondary | ICD-10-CM | POA: Diagnosis not present

## 2019-12-15 DIAGNOSIS — R202 Paresthesia of skin: Secondary | ICD-10-CM | POA: Diagnosis not present

## 2019-12-15 DIAGNOSIS — M79641 Pain in right hand: Secondary | ICD-10-CM | POA: Diagnosis not present

## 2019-12-15 DIAGNOSIS — M25561 Pain in right knee: Secondary | ICD-10-CM | POA: Diagnosis not present

## 2019-12-15 DIAGNOSIS — M79642 Pain in left hand: Secondary | ICD-10-CM | POA: Diagnosis not present

## 2019-12-15 DIAGNOSIS — R200 Anesthesia of skin: Secondary | ICD-10-CM | POA: Diagnosis not present

## 2019-12-23 DIAGNOSIS — Z79899 Other long term (current) drug therapy: Secondary | ICD-10-CM | POA: Diagnosis not present

## 2019-12-23 DIAGNOSIS — R202 Paresthesia of skin: Secondary | ICD-10-CM | POA: Diagnosis not present

## 2019-12-23 DIAGNOSIS — R200 Anesthesia of skin: Secondary | ICD-10-CM | POA: Diagnosis not present

## 2019-12-24 ENCOUNTER — Emergency Department: Payer: Medicare HMO

## 2019-12-24 ENCOUNTER — Encounter: Payer: Self-pay | Admitting: Emergency Medicine

## 2019-12-24 ENCOUNTER — Other Ambulatory Visit: Payer: Self-pay

## 2019-12-24 ENCOUNTER — Inpatient Hospital Stay
Admission: EM | Admit: 2019-12-24 | Discharge: 2019-12-25 | DRG: 189 | Discharge status: Against Medical Advice | Payer: Medicare HMO | Attending: Internal Medicine | Admitting: Internal Medicine

## 2019-12-24 DIAGNOSIS — Z79899 Other long term (current) drug therapy: Secondary | ICD-10-CM

## 2019-12-24 DIAGNOSIS — Z7982 Long term (current) use of aspirin: Secondary | ICD-10-CM

## 2019-12-24 DIAGNOSIS — M7989 Other specified soft tissue disorders: Secondary | ICD-10-CM | POA: Diagnosis not present

## 2019-12-24 DIAGNOSIS — Z87891 Personal history of nicotine dependence: Secondary | ICD-10-CM | POA: Diagnosis not present

## 2019-12-24 DIAGNOSIS — E1122 Type 2 diabetes mellitus with diabetic chronic kidney disease: Secondary | ICD-10-CM | POA: Diagnosis present

## 2019-12-24 DIAGNOSIS — E119 Type 2 diabetes mellitus without complications: Secondary | ICD-10-CM

## 2019-12-24 DIAGNOSIS — N182 Chronic kidney disease, stage 2 (mild): Secondary | ICD-10-CM | POA: Diagnosis not present

## 2019-12-24 DIAGNOSIS — R0600 Dyspnea, unspecified: Secondary | ICD-10-CM | POA: Diagnosis not present

## 2019-12-24 DIAGNOSIS — Z7902 Long term (current) use of antithrombotics/antiplatelets: Secondary | ICD-10-CM

## 2019-12-24 DIAGNOSIS — R0902 Hypoxemia: Secondary | ICD-10-CM | POA: Diagnosis not present

## 2019-12-24 DIAGNOSIS — Z8249 Family history of ischemic heart disease and other diseases of the circulatory system: Secondary | ICD-10-CM | POA: Diagnosis not present

## 2019-12-24 DIAGNOSIS — Z794 Long term (current) use of insulin: Secondary | ICD-10-CM

## 2019-12-24 DIAGNOSIS — J449 Chronic obstructive pulmonary disease, unspecified: Secondary | ICD-10-CM | POA: Diagnosis present

## 2019-12-24 DIAGNOSIS — I129 Hypertensive chronic kidney disease with stage 1 through stage 4 chronic kidney disease, or unspecified chronic kidney disease: Secondary | ICD-10-CM | POA: Diagnosis not present

## 2019-12-24 DIAGNOSIS — R079 Chest pain, unspecified: Secondary | ICD-10-CM | POA: Diagnosis not present

## 2019-12-24 DIAGNOSIS — I252 Old myocardial infarction: Secondary | ICD-10-CM

## 2019-12-24 DIAGNOSIS — R600 Localized edema: Secondary | ICD-10-CM | POA: Diagnosis not present

## 2019-12-24 DIAGNOSIS — Z5329 Procedure and treatment not carried out because of patient's decision for other reasons: Secondary | ICD-10-CM | POA: Diagnosis not present

## 2019-12-24 DIAGNOSIS — I700 Atherosclerosis of aorta: Secondary | ICD-10-CM | POA: Diagnosis not present

## 2019-12-24 DIAGNOSIS — I10 Essential (primary) hypertension: Secondary | ICD-10-CM | POA: Diagnosis present

## 2019-12-24 DIAGNOSIS — Z955 Presence of coronary angioplasty implant and graft: Secondary | ICD-10-CM | POA: Diagnosis not present

## 2019-12-24 DIAGNOSIS — E785 Hyperlipidemia, unspecified: Secondary | ICD-10-CM | POA: Diagnosis present

## 2019-12-24 DIAGNOSIS — J441 Chronic obstructive pulmonary disease with (acute) exacerbation: Secondary | ICD-10-CM

## 2019-12-24 DIAGNOSIS — I491 Atrial premature depolarization: Secondary | ICD-10-CM | POA: Diagnosis not present

## 2019-12-24 DIAGNOSIS — I340 Nonrheumatic mitral (valve) insufficiency: Secondary | ICD-10-CM | POA: Diagnosis not present

## 2019-12-24 DIAGNOSIS — J9601 Acute respiratory failure with hypoxia: Principal | ICD-10-CM | POA: Diagnosis present

## 2019-12-24 DIAGNOSIS — Z20822 Contact with and (suspected) exposure to covid-19: Secondary | ICD-10-CM | POA: Diagnosis not present

## 2019-12-24 DIAGNOSIS — R0789 Other chest pain: Secondary | ICD-10-CM | POA: Diagnosis not present

## 2019-12-24 DIAGNOSIS — R05 Cough: Secondary | ICD-10-CM | POA: Diagnosis not present

## 2019-12-24 DIAGNOSIS — I2510 Atherosclerotic heart disease of native coronary artery without angina pectoris: Secondary | ICD-10-CM | POA: Diagnosis not present

## 2019-12-24 DIAGNOSIS — R0602 Shortness of breath: Secondary | ICD-10-CM | POA: Diagnosis not present

## 2019-12-24 DIAGNOSIS — R0609 Other forms of dyspnea: Secondary | ICD-10-CM

## 2019-12-24 LAB — CBC WITH DIFFERENTIAL/PLATELET
Abs Immature Granulocytes: 0.04 10*3/uL (ref 0.00–0.07)
Basophils Absolute: 0.1 10*3/uL (ref 0.0–0.1)
Basophils Relative: 1 %
Eosinophils Absolute: 0.3 10*3/uL (ref 0.0–0.5)
Eosinophils Relative: 3 %
HCT: 42.1 % (ref 39.0–52.0)
Hemoglobin: 13.5 g/dL (ref 13.0–17.0)
Immature Granulocytes: 0 %
Lymphocytes Relative: 20 %
Lymphs Abs: 2.1 10*3/uL (ref 0.7–4.0)
MCH: 26.4 pg (ref 26.0–34.0)
MCHC: 32.1 g/dL (ref 30.0–36.0)
MCV: 82.2 fL (ref 80.0–100.0)
Monocytes Absolute: 1.2 10*3/uL — ABNORMAL HIGH (ref 0.1–1.0)
Monocytes Relative: 11 %
Neutro Abs: 6.8 10*3/uL (ref 1.7–7.7)
Neutrophils Relative %: 65 %
Platelets: 277 10*3/uL (ref 150–400)
RBC: 5.12 MIL/uL (ref 4.22–5.81)
RDW: 16.8 % — ABNORMAL HIGH (ref 11.5–15.5)
WBC: 10.5 10*3/uL (ref 4.0–10.5)
nRBC: 0 % (ref 0.0–0.2)

## 2019-12-24 LAB — COMPREHENSIVE METABOLIC PANEL
ALT: 21 U/L (ref 0–44)
AST: 25 U/L (ref 15–41)
Albumin: 4 g/dL (ref 3.5–5.0)
Alkaline Phosphatase: 61 U/L (ref 38–126)
Anion gap: 11 (ref 5–15)
BUN: 20 mg/dL (ref 8–23)
CO2: 27 mmol/L (ref 22–32)
Calcium: 9.7 mg/dL (ref 8.9–10.3)
Chloride: 98 mmol/L (ref 98–111)
Creatinine, Ser: 1.24 mg/dL (ref 0.61–1.24)
GFR calc Af Amer: >60 mL/min (ref >60)
GFR calc non Af Amer: 57 mL/min — ABNORMAL LOW (ref >60)
Glucose, Bld: 153 mg/dL — ABNORMAL HIGH (ref 70–99)
Potassium: 4 mmol/L (ref 3.5–5.1)
Sodium: 136 mmol/L (ref 135–145)
Total Bilirubin: 0.7 mg/dL (ref 0.3–1.2)
Total Protein: 7.3 g/dL (ref 6.5–8.1)

## 2019-12-24 LAB — SARS CORONAVIRUS 2 BY RT PCR (HOSPITAL ORDER, PERFORMED IN ~~LOC~~ HOSPITAL LAB): SARS Coronavirus 2: NEGATIVE

## 2019-12-24 LAB — TROPONIN I (HIGH SENSITIVITY)
Troponin I (High Sensitivity): 5 ng/L (ref <18)
Troponin I (High Sensitivity): 5 ng/L (ref <18)

## 2019-12-24 LAB — BRAIN NATRIURETIC PEPTIDE: B Natriuretic Peptide: 33.5 pg/mL (ref 0.0–100.0)

## 2019-12-24 LAB — GLUCOSE, CAPILLARY: Glucose-Capillary: 218 mg/dL — ABNORMAL HIGH (ref 70–99)

## 2019-12-24 MED ORDER — METHYLPREDNISOLONE SODIUM SUCC 40 MG IJ SOLR: 40.0000 mg | Freq: Four times a day (QID) | INTRAMUSCULAR | Status: DC

## 2019-12-24 MED ORDER — INSULIN ASPART 100 UNIT/ML ~~LOC~~ SOLN
0.0000 [IU] | Freq: Every day | SUBCUTANEOUS | Status: DC
  Administered 2019-12-24: 2 [IU] via SUBCUTANEOUS
  Filled 2019-12-24: qty 1

## 2019-12-24 MED ORDER — ENOXAPARIN SODIUM 40 MG/0.4ML ~~LOC~~ SOLN
40.0000 mg | SUBCUTANEOUS | Status: DC
  Administered 2019-12-25: 40 mg via SUBCUTANEOUS
  Filled 2019-12-24: qty 0.4

## 2019-12-24 MED ORDER — METHYLPREDNISOLONE SODIUM SUCC 125 MG IJ SOLR
125.0000 mg | Freq: Once | INTRAMUSCULAR | Status: AC
  Administered 2019-12-24: 125 mg via INTRAVENOUS
  Filled 2019-12-24: qty 2

## 2019-12-24 MED ORDER — IPRATROPIUM-ALBUTEROL 0.5-2.5 (3) MG/3ML IN SOLN
3.0000 mL | Freq: Once | RESPIRATORY_TRACT | Status: AC
  Administered 2019-12-24: 3 mL via RESPIRATORY_TRACT
  Filled 2019-12-24: qty 6

## 2019-12-24 MED ORDER — IPRATROPIUM-ALBUTEROL 0.5-2.5 (3) MG/3ML IN SOLN
3.0000 mL | Freq: Once | RESPIRATORY_TRACT | Status: AC
  Administered 2019-12-24: 3 mL via RESPIRATORY_TRACT

## 2019-12-24 MED ORDER — INSULIN ASPART 100 UNIT/ML ~~LOC~~ SOLN
0.0000 [IU] | Freq: Three times a day (TID) | SUBCUTANEOUS | Status: DC
  Administered 2019-12-25: 15 [IU] via SUBCUTANEOUS
  Administered 2019-12-25: 11 [IU] via SUBCUTANEOUS
  Filled 2019-12-24 (×2): qty 1

## 2019-12-24 MED ORDER — PREDNISONE 20 MG PO TABS: 40.0000 mg | ORAL_TABLET | Freq: Every day | ORAL | Status: DC

## 2019-12-24 MED ORDER — IOHEXOL 350 MG/ML SOLN
100.0000 mL | Freq: Once | INTRAVENOUS | Status: AC | PRN
  Administered 2019-12-24: 100 mL via INTRAVENOUS

## 2019-12-24 MED ORDER — IPRATROPIUM-ALBUTEROL 0.5-2.5 (3) MG/3ML IN SOLN: 3.0000 mL | Freq: Four times a day (QID) | RESPIRATORY_TRACT | Status: DC

## 2019-12-24 MED ORDER — ALBUTEROL SULFATE (2.5 MG/3ML) 0.083% IN NEBU: 2.5000 mg | INHALATION_SOLUTION | RESPIRATORY_TRACT | Status: DC | PRN

## 2019-12-24 NOTE — ED Notes (Signed)
Mimi RN gathering equipment to place 20g IV above wrist.

## 2019-12-24 NOTE — ED Notes (Signed)
Walked pt in the hallway. O2 was 95% before walking and dropped down to 89% by the end of the walk. EDP Paduchowski notified

## 2019-12-24 NOTE — ED Notes (Signed)
Pt given drink and food tray with verbal okay from [place].

## 2019-12-24 NOTE — ED Notes (Signed)
Called CT to notify of IV placement.

## 2019-12-24 NOTE — ED Notes (Signed)
History of smoking and upper resp infection. Denies history of COPD. LLE edema noted in ankle/foot. Pt resp reg/unlabored; able to speak in full sentences. Skin dry. Currently on 2L. History of stent placement post MI.

## 2019-12-24 NOTE — ED Notes (Signed)
EDP Paduchowski at bedside with this RN.

## 2019-12-24 NOTE — H&P (Addendum)
History and Physical    Henry [name] GLO:[id number] DOB: 1945/11/28 DOA: 12/24/2019  PCP: Juluis Pitch, MD   Patient coming from: Home  I have personally briefly reviewed patient's old medical records in [place]  Chief Complaint: Shortness of breath, weakness x one day, lower extremity edema x3 weeks.  HPI: Henry [name] is a 74 y.o. male with medical history significant for CAD status post stent angioplasty, HTN, COPD, who presents with shortness of breath with exertion for the past 24 hours.  It was preceded by 3-week history of bilateral lower extremity edema,   His cardiologist started him on Lasix just over 2 weeks ago with plans for outpatient echocardiogram in the upcoming weeks.  The Lasix improved his swelling but over the past 24 hours he noted recurrence of the swelling in the left leg, now associated with weakness and dyspnea on exertion.  He denies chest pain or palpitations..  He has an occasional cough but denies wheezing or increased need for the use of any inhalers.  He denies fever or chills.  He is fully vaccinated against Covid. ED Course: On arrival in the emergency room his vitals were within normal limits however he had mild desaturation with ambulation to about 89%.  His chest x-ray was clear.  EKG with no acute ST-T wave abnormality.  Troponin negative x2 at 5/5, normal BNP, Covid negative.  Left lower extremity Doppler negative.  He had a CTA chest that was negative for PE.  Patient was given DuoNeb treatment but felt no difference.  On ambulation hoping for discharge he desaturated to 87%.  He was placed on O2. Hospitalist consulted for admission.  While being evaluated in the emergency room, patient complained of generalized malaise while lying flat improving with sitting upright though not appearing overtly short of breath.  Review of Systems: As per HPI otherwise all other systems on review of systems negative.    Past Medical History:   Diagnosis Date  . Arthritis   . CAD (coronary artery disease)    a. anterior stemi 08/10/2017; b. LHC 08/10/17: pLAD 70, p-mLAD 99 thrombotic, ostD1 70, p-mLCx 30, pRCA-1 50 calcified, pRCA-2 99 calcified, dRCA 30. Successful PCI/DES to p-mLAD x 2  . Cancer ([place])    skin  . CKD (chronic kidney disease), stage II   . Glaucoma   . History of kidney stones   . Hyperlipidemia   . Hypertension   . Insulin dependent diabetes mellitus   . Ischemic cardiomyopathy    a. TTE 08/10/17: technically difficult study, no diagnostic RWMA, EF 40-50%, poor windows, very difficult study   . Myocardial infarction ([place])   . Neuropathy   . S/P angioplasty with stent 08/10/17 emergently to LAD ,  08/13/17 atherectomy and stent to pRCA  08/14/2017    Past Surgical History:  Procedure Laterality Date  . CARDIAC CATHETERIZATION    . COLONOSCOPY WITH PROPOFOL N/A 12/08/2014   Procedure: COLONOSCOPY WITH PROPOFOL;  Surgeon: Manya Silvas, MD;  Location: [facility] ENDOSCOPY;  Service: Endoscopy;  Laterality: N/A;  . COLONOSCOPY WITH PROPOFOL N/A 01/10/2016   Procedure: COLONOSCOPY WITH PROPOFOL;  Surgeon: Manya Silvas, MD;  Location: [facility] ENDOSCOPY;  Service: Endoscopy;  Laterality: N/A;  . COLONOSCOPY WITH PROPOFOL N/A 04/28/2019   Procedure: COLONOSCOPY WITH PROPOFOL;  Surgeon: Robert Bellow, MD;  Location: [facility] ENDOSCOPY;  Service: Endoscopy;  Laterality: N/A;  . CORONARY ATHERECTOMY N/A 08/13/2017   Procedure: CORONARY ATHERECTOMY;  Surgeon: Wellington Hampshire, MD;  Location: [place] CV LAB;  Service: Cardiovascular;  Laterality: N/A;  . CORONARY STENT INTERVENTION N/A 08/13/2017   Procedure: CORONARY STENT INTERVENTION;  Surgeon: Wellington Hampshire, MD;  Location: [place] CV LAB;  Service: Cardiovascular;  Laterality: N/A;  . CORONARY/GRAFT ACUTE MI REVASCULARIZATION N/A 08/10/2017   Procedure: Coronary/Graft Acute MI Revascularization;  Surgeon: Burnell Blanks, MD;  Location: [place] CV LAB;   Service: Cardiovascular;  Laterality: N/A;  . EXTRACORPOREAL SHOCK WAVE LITHOTRIPSY Right 11/09/2015   Procedure: EXTRACORPOREAL SHOCK WAVE LITHOTRIPSY (ESWL);  Surgeon: Royston Cowper, MD;  Location: [facility] ORS;  Service: Urology;  Laterality: Right;  . EXTRACORPOREAL SHOCK WAVE LITHOTRIPSY Left 10/17/2016   Procedure: EXTRACORPOREAL SHOCK WAVE LITHOTRIPSY (ESWL);  Surgeon: Royston Cowper, MD;  Location: [facility] ORS;  Service: Urology;  Laterality: Left;  . LEFT HEART CATH AND CORONARY ANGIOGRAPHY N/A 08/10/2017   Procedure: LEFT HEART CATH AND CORONARY ANGIOGRAPHY;  Surgeon: Burnell Blanks, MD;  Location: [place] CV LAB;  Service: Cardiovascular;  Laterality: N/A;  . LITHOTRIPSY     x 6  . PAROTIDECTOMY  2015  . TEMPORARY PACEMAKER N/A 08/13/2017   Procedure: TEMPORARY PACEMAKER;  Surgeon: Wellington Hampshire, MD;  Location: [place] CV LAB;  Service: Cardiovascular;  Laterality: N/A;  . TONSILLECTOMY       reports that he quit smoking about 2 years ago. His smoking use included cigarettes. He has a 50.00 pack-year smoking history. He has never used smokeless tobacco. He reports that he does not drink alcohol and does not use drugs.  No Known Allergies  Family History  Problem Relation Age of Onset  . Heart attack Mother   . Melanoma Father   . Parkinsonism Father       Prior to Admission medications   Medication Sig Start Date End Date Taking? Authorizing Provider  acetaminophen (TYLENOL) 325 MG tablet Take 2 tablets (650 mg total) by mouth every 6 (six) hours as needed for mild pain (or Fever >/= 101). 01/27/16   Gouru, Illene Silver, MD  albuterol (PROVENTIL HFA;VENTOLIN HFA) 108 (90 Base) MCG/ACT inhaler Inhale 2 puffs into the lungs every 6 (six) hours as needed for wheezing or shortness of breath. 01/27/16   Nicholes Mango, MD  amoxicillin-clavulanate (AUGMENTIN) 875-125 MG tablet Take 1 tablet by mouth 2 (two) times daily. Patient not taking: Reported on 04/28/2019 12/26/18    Carrie Mew, MD  aspirin EC 81 MG EC tablet Take 1 tablet (81 mg total) by mouth daily. 08/15/17   Isaiah Serge, NP  atorvastatin (LIPITOR) 80 MG tablet Take 1 tablet (80 mg total) by mouth daily at 6 PM. 08/14/17   Isaiah Serge, NP  cetirizine (ZYRTEC) 10 MG tablet Take 10 mg by mouth daily.    [provider]  fluticasone (FLONASE) 50 MCG/ACT nasal spray Place 1 spray into both nostrils daily.    [provider]  HYDROcodone-acetaminophen (NORCO/VICODIN) 5-325 MG tablet Take 1 tablet by mouth every 6 (six) hours as needed. Pt to take twice a day as needed.    [provider]  insulin detemir (LEVEMIR) 100 UNIT/ML injection Inject 0.5 mLs (50 Units total) into the skin 2 (two) times daily. 08/12/17   Gladstone Lighter, MD  irbesartan (AVAPRO) 300 MG tablet TAKE ONE TABLET EVERY DAY 06/05/18   Isaiah Serge, NP  loratadine (CLARITIN) 10 MG tablet Take 1 tablet (10 mg total) by mouth daily as needed for allergies. 08/12/17   Gladstone Lighter, MD  metoprolol tartrate (LOPRESSOR) 25 MG tablet Take 1 tablet (25 mg total) by mouth 2 (two) times daily. 08/14/17   Isaiah Serge, NP  montelukast (SINGULAIR) 10 MG tablet Take 1 tablet by mouth daily. 07/10/17   [provider]  multivitamin-iron-minerals-folic acid (CENTRUM) chewable tablet Chew 1 tablet by mouth daily.    [provider]  nicotine (NICODERM CQ - DOSED IN MG/24 HOURS) 14 mg/24hr patch Place 1 patch (14 mg total) onto the skin daily. Patient taking differently: Place 7 mg onto the skin daily.  08/14/17   Isaiah Serge, NP  nitroGLYCERIN (NITROSTAT) 0.4 MG SL tablet PLACE 1 TABLET UNDER TONGUE EVERY 5 MIN AS NEEDED FOR CHEST PAIN IF NO RELIEF IN15 MIN CALL 911 (MAX 3 TABS) 06/29/19   Isaiah Serge, NP  ondansetron (ZOFRAN ODT) 4 MG disintegrating tablet Take 1 tablet (4 mg total) by mouth every 8 (eight) hours as needed for nausea or vomiting. 12/26/18   Carrie Mew, MD  predniSONE  (DELTASONE) 20 MG tablet Take 60 mg daily x 2 days then 40 mg daily x 2 days then 20 mg daily x 2 days 06/26/19   Drenda Freeze, MD  sitaGLIPtin-metformin (JANUMET) 50-1000 MG tablet Take 1 tablet by mouth 2 (two) times daily with a meal.    [provider]  ticagrelor (BRILINTA) 90 MG TABS tablet Take 1 tablet (90 mg total) by mouth 2 (two) times daily. 08/14/17   Isaiah Serge, NP    Physical Exam: Vitals:   12/24/19 2145 12/24/19 2215 12/24/19 2228 12/24/19 2230  BP:  121/72  (!) 146/81  Pulse:  64 74 62  Resp:  16 14 15   Temp:      TempSrc:      SpO2: 98% 97% 97% 97%  Weight:      Height:         Vitals:   12/24/19 2145 12/24/19 2215 12/24/19 2228 12/24/19 2230  BP:  121/72  (!) 146/81  Pulse:  64 74 62  Resp:  16 14 15   Temp:      TempSrc:      SpO2: 98% 97% 97% 97%  Weight:      Height:          Constitutional: Alert and oriented x 3 . Not in any apparent distress HEENT:      Head: Normocephalic and atraumatic.         Eyes: PERLA, EOMI, Conjunctivae are normal. Sclera is non-icteric.       Mouth/Throat: Mucous membranes are moist.       Neck: Supple with no signs of meningismus. Cardiovascular: Regular rate and rhythm. No murmurs, gallops, or rubs. 2+ symmetrical distal pulses are present . No JVD. 1+LLE edema Respiratory: Respiratory effort normal .Lungs sounds clear bilaterally. No wheezes, crackles, or rhonchi.  Gastrointestinal: Soft, non tender, and non distended with positive bowel sounds. No rebound or guarding. Genitourinary: No CVA tenderness. Musculoskeletal: Nontender with normal range of motion in all extremities. No cyanosis, or erythema of extremities. Neurologic: Normal speech and language. Face is symmetric. Moving all extremities. No gross focal neurologic deficits . Skin: Skin is warm, dry.  No rash or ulcers Psychiatric: Mood and affect are normal Speech and behavior are norma   Labs on Admission: I have personally reviewed  following labs and imaging studies  CBC: Recent Labs  Lab 12/24/19 1800  WBC 10.5  NEUTROABS 6.8  HGB 13.5  HCT 42.1  MCV 82.2  PLT  440   Basic Metabolic Panel: Recent Labs  Lab 12/24/19 1800  NA 136  K 4.0  CL 98  CO2 27  GLUCOSE 153*  BUN 20  CREATININE 1.24  CALCIUM 9.7   GFR: Estimated Creatinine Clearance: 65.3 mL/min (by C-G formula based on SCr of 1.24 mg/dL). Liver Function Tests: Recent Labs  Lab 12/24/19 1800  AST 25  ALT 21  ALKPHOS 61  BILITOT 0.7  PROT 7.3  ALBUMIN 4.0   No results for input(s): LIPASE, AMYLASE in the last 168 hours. No results for input(s): AMMONIA in the last 168 hours. Coagulation Profile: No results for input(s): INR, PROTIME in the last 168 hours. Cardiac Enzymes: No results for input(s): CKTOTAL, CKMB, CKMBINDEX, TROPONINI in the last 168 hours. BNP (last 3 results) No results for input(s): PROBNP in the last 8760 hours. HbA1C: No results for input(s): HGBA1C in the last 72 hours. CBG: No results for input(s): GLUCAP in the last 168 hours. Lipid Profile: No results for input(s): CHOL, HDL, LDLCALC, TRIG, CHOLHDL, LDLDIRECT in the last 72 hours. Thyroid Function Tests: No results for input(s): TSH, T4TOTAL, FREET4, T3FREE, THYROIDAB in the last 72 hours. Anemia Panel: No results for input(s): VITAMINB12, FOLATE, FERRITIN, TIBC, IRON, RETICCTPCT in the last 72 hours. Urine analysis:    Component Value Date/Time   COLORURINE YELLOW (A) 01/25/2016 1903   APPEARANCEUR CLEAR (A) 01/25/2016 1903   LABSPEC 1.014 01/25/2016 1903   PHURINE 5.0 01/25/2016 1903   GLUCOSEU NEGATIVE 01/25/2016 1903   HGBUR NEGATIVE 01/25/2016 1903   BILIRUBINUR NEGATIVE 01/25/2016 1903   KETONESUR NEGATIVE 01/25/2016 1903   PROTEINUR NEGATIVE 01/25/2016 1903   NITRITE NEGATIVE 01/25/2016 1903   LEUKOCYTESUR NEGATIVE 01/25/2016 1903    Radiological Exams on Admission: DG Chest 2 View  Result Date: 12/24/2019 CLINICAL DATA:  Shortness of  breath.  Productive cough. EXAM: CHEST - 2 VIEW COMPARISON:  June 26, 2019 and Sep 01, 2019 FINDINGS: Chronic architectural changes are seen in the lung bases. However, superimposed haziness has worsened since June 26, 2019, particularly in the right mid lung. The heart, hila, mediastinum, lungs, and pleura are otherwise unremarkable. IMPRESSION: 1. There is a background of chronic architectural distortion in the bases, right greater than left. However, there is increasing haziness and opacity in the right mid lung in the lower lungs bilaterally which is progressive since March 2021 but similar since May 2021. CT imaging is recommended for better evaluation. Electronically Signed   By: Dorise Bullion III M.D   On: 12/24/2019 18:51   CT Angio Chest PE W and/or Wo Contrast  Result Date: 12/24/2019 CLINICAL DATA:  74 year old male with chest pain, lower extremity edema, shortness of breath, productive cough. EXAM: CT ANGIOGRAPHY CHEST WITH CONTRAST TECHNIQUE: Multidetector CT imaging of the chest was performed using the standard protocol during bolus administration of intravenous contrast. Multiplanar CT image reconstructions and MIPs were obtained to evaluate the vascular anatomy. CONTRAST:  150mL OMNIPAQUE IOHEXOL 350 MG/ML SOLN COMPARISON:  Chest radiographs earlier today.  CTA chest 09/01/2019. FINDINGS: Cardiovascular: Good contrast bolus timing in the pulmonary arterial tree. No focal filling defect identified in the pulmonary arteries to suggest acute pulmonary embolism. Calcified coronary artery atherosclerosis. Calcified aortic atherosclerosis. No cardiomegaly or pericardial effusion. Mediastinum/Nodes: Negative.  No lymphadenopathy. Lungs/Pleura: Stable lung volumes. Subtle centrilobular emphysema suspected, but otherwise stable mild lung base predominant atelectasis and/or scarring. Major airways remain patent, there is chronic perihilar bronchial wall thickening. No acute pulmonary opacity or pleural  effusion.  Upper Abdomen: Stable and negative visible upper abdomen. Musculoskeletal: No acute osseous abnormality identified. Review of the MIP images confirms the above findings. IMPRESSION: 1. Negative for acute pulmonary embolus. 2. No acute findings in the chest. Stable bilateral lung scarring with chronic airway thickening and subtle underlying emphysema suspected. 3. Calcified coronary artery and Aortic Atherosclerosis (ICD10-I70.0). Electronically Signed   By: Genevie Ann M.D.   On: 12/24/2019 21:34   US Venous Img Lower Unilateral Left  Result Date: 12/24/2019 CLINICAL DATA:  Leg swelling. EXAM: LEFT LOWER EXTREMITY VENOUS DOPPLER ULTRASOUND TECHNIQUE: Gray-scale sonography with compression, as well as color and duplex ultrasound, were performed to evaluate the deep venous system(s) from the level of the common femoral vein through the popliteal and proximal calf veins. COMPARISON:  None. FINDINGS: VENOUS Normal compressibility of the common femoral, superficial femoral, and popliteal veins, as well as the visualized calf veins. Visualized portions of profunda femoral vein and great saphenous vein unremarkable. No filling defects to suggest DVT on grayscale or color Doppler imaging. Doppler waveforms show normal direction of venous flow, normal respiratory plasticity and response to augmentation. Limited views of the contralateral common femoral vein are unremarkable. OTHER None. Limitations: none IMPRESSION: Negative. Electronically Signed   By: Dorise Bullion III M.D   On: 12/24/2019 18:47    EKG: Independently reviewed. Interpretation : Normal sinus rhythm with no acute ST-T wave changes  Assessment/Plan 74 year old male with history of CAD status post stent angioplasty, HTN, COPD, who presents with shortness of breath with exertion and hypoxia  Dyspnea on exertion with hypoxia -Patient with desaturation to 87% with ambulation -Etiology uncertain: No overt wheezing to suggest COPD exacerbation  and patient denies need to use inhalers more often -Ruled out for acute PE with CTA chest.  -Troponins negative at 5 and 5 so low suspicion for ACS -CHF a possibility: Patient recently started on Lasix by his cardiologist and patient reports lower extremity edema, orthopnea, though CTA chest with no evidence of pleural effusion or pulmonary vascular congestion and BNP normal at 33. -Echocardiogram ordered -Trial of Lasix 20 mg IV twice daily with continuation of home beta-blocker and ACE/ARB -Reds vest to assess fluid -Daily weights, intake and output monitoring -Consider consult to primary cardiologist in the a.m. -Supplemental oxygen to keep sats over 92%    COPD  -As needed bronchodilator treatment -No strong evidence for acute exacerbation    Diabetes ([place]) -Sliding scale insulin coverage    HTN (hypertension) -Continue home meds    CAD in native artery -Continue nitrates, statins, antiplatelets and beta-blockers -Patient denies chest pain.  Troponins have been negative.  EKGs with no acute changes.    DVT prophylaxis: Lovenox  Code Status: full code  Family Communication:  none  Disposition Plan: Back to previous home environment Consults called: none  Status:.At the time of admission, it appears that the appropriate admission status for this patient is INPATIENT. This is judged to be reasonable and necessary in order to provide the required intensity of service to ensure the patient's safety given the presenting symptoms, physical exam findings, and initial radiographic and laboratory data in the context of their  Comorbid conditions.   Patient requires inpatient status due to high intensity of service, high risk for further deterioration and high frequency of surveillance required.   I certify that at the point of admission it is my clinical judgment that the patient will require inpatient hospital care spanning beyond [place]  MD [facility]     12/24/2019, 11:14 PM

## 2019-12-24 NOTE — ED Provider Notes (Signed)
[facility] Emergency Department Provider Note  Time seen: 9:02 PM  I have reviewed the triage vital signs and the nursing notes.   HISTORY  Chief Complaint Shortness of Breath   HPI Shea Kapur Cabal is a 74 y.o. male with a past medical history of CAD, CKD, hypertension, hyperlipidemia, diabetes, presents to the emergency department for shortness of breath.  According to the patient since this morning he has been feeling short of breath, states that is much worse with exertion.  He is also noticed over the past week or so swelling in his legs mostly in his left lower extremity which she says is fairly new for him.  Patient denies any chest pain now or at any point.  States he has had a slight cough at times denies any fever.  Patient is vaccinated against Covid.   Past Medical History:  Diagnosis Date  . Arthritis   . CAD (coronary artery disease)    a. anterior stemi 08/10/2017; b. LHC 08/10/17: pLAD 70, p-mLAD 99 thrombotic, ostD1 70, p-mLCx 30, pRCA-1 50 calcified, pRCA-2 99 calcified, dRCA 30. Successful PCI/DES to p-mLAD x 2  . Cancer ([place])    skin  . CKD (chronic kidney disease), stage II   . Glaucoma   . History of kidney stones   . Hyperlipidemia   . Hypertension   . Insulin dependent diabetes mellitus   . Ischemic cardiomyopathy    a. TTE 08/10/17: technically difficult study, no diagnostic RWMA, EF 40-50%, poor windows, very difficult study   . Myocardial infarction ([place])   . Neuropathy   . S/P angioplasty with stent 08/10/17 emergently to LAD ,  08/13/17 atherectomy and stent to pRCA  08/14/2017    Patient Active Problem List   Diagnosis Date Noted  . S/P angioplasty with stent 08/10/17 emergently to LAD ,  08/13/17 atherectomy and stent to [facility]  08/14/2017  . CAD in native artery 08/12/2017  . Leukocytosis 08/12/2017  . Insulin dependent diabetes mellitus 08/12/2017  . NSTEMI (non-ST elevated myocardial infarction) ([place]) 08/12/2017  . Acute ST elevation  myocardial infarction (STEMI) involving left anterior descending (LAD) coronary artery ([place]) 08/10/2017  . ST elevation myocardial infarction involving left anterior descending (LAD) coronary artery ([place])   . CAP (community acquired pneumonia) 01/25/2016  . Diabetes ([place]) 01/25/2016  . HTN (hypertension) 01/25/2016  . HLD (hyperlipidemia) 01/25/2016    Past Surgical History:  Procedure Laterality Date  . CARDIAC CATHETERIZATION    . COLONOSCOPY WITH PROPOFOL N/A 12/08/2014   Procedure: COLONOSCOPY WITH PROPOFOL;  Surgeon: Manya Silvas, MD;  Location: [facility] ENDOSCOPY;  Service: Endoscopy;  Laterality: N/A;  . COLONOSCOPY WITH PROPOFOL N/A 01/10/2016   Procedure: COLONOSCOPY WITH PROPOFOL;  Surgeon: Manya Silvas, MD;  Location: [facility] ENDOSCOPY;  Service: Endoscopy;  Laterality: N/A;  . COLONOSCOPY WITH PROPOFOL N/A 04/28/2019   Procedure: COLONOSCOPY WITH PROPOFOL;  Surgeon: Robert Bellow, MD;  Location: [facility] ENDOSCOPY;  Service: Endoscopy;  Laterality: N/A;  . CORONARY ATHERECTOMY N/A 08/13/2017   Procedure: CORONARY ATHERECTOMY;  Surgeon: Wellington Hampshire, MD;  Location: [place] CV LAB;  Service: Cardiovascular;  Laterality: N/A;  . CORONARY STENT INTERVENTION N/A 08/13/2017   Procedure: CORONARY STENT INTERVENTION;  Surgeon: Wellington Hampshire, MD;  Location: [place] CV LAB;  Service: Cardiovascular;  Laterality: N/A;  . CORONARY/GRAFT ACUTE MI REVASCULARIZATION N/A 08/10/2017   Procedure: Coronary/Graft Acute MI Revascularization;  Surgeon: Burnell Blanks, MD;  Location: [place] CV LAB;  Service:  Cardiovascular;  Laterality: N/A;  . EXTRACORPOREAL SHOCK WAVE LITHOTRIPSY Right 11/09/2015   Procedure: EXTRACORPOREAL SHOCK WAVE LITHOTRIPSY (ESWL);  Surgeon: Royston Cowper, MD;  Location: [facility] ORS;  Service: Urology;  Laterality: Right;  . EXTRACORPOREAL SHOCK WAVE LITHOTRIPSY Left 10/17/2016   Procedure: EXTRACORPOREAL SHOCK WAVE LITHOTRIPSY (ESWL);  Surgeon: Royston Cowper, MD;  Location: [facility] ORS;  Service: Urology;  Laterality: Left;  . LEFT HEART CATH AND CORONARY ANGIOGRAPHY N/A 08/10/2017   Procedure: LEFT HEART CATH AND CORONARY ANGIOGRAPHY;  Surgeon: Burnell Blanks, MD;  Location: [place] CV LAB;  Service: Cardiovascular;  Laterality: N/A;  . LITHOTRIPSY     x 6  . PAROTIDECTOMY  2015  . TEMPORARY PACEMAKER N/A 08/13/2017   Procedure: TEMPORARY PACEMAKER;  Surgeon: Wellington Hampshire, MD;  Location: [place] CV LAB;  Service: Cardiovascular;  Laterality: N/A;  . TONSILLECTOMY      Prior to Admission medications   Medication Sig Start Date End Date Taking? Authorizing Provider  acetaminophen (TYLENOL) 325 MG tablet Take 2 tablets (650 mg total) by mouth every 6 (six) hours as needed for mild pain (or Fever >/= 101). 01/27/16   Gouru, Illene Silver, MD  albuterol (PROVENTIL HFA;VENTOLIN HFA) 108 (90 Base) MCG/ACT inhaler Inhale 2 puffs into the lungs every 6 (six) hours as needed for wheezing or shortness of breath. 01/27/16   Nicholes Mango, MD  amoxicillin-clavulanate (AUGMENTIN) 875-125 MG tablet Take 1 tablet by mouth 2 (two) times daily. Patient not taking: Reported on 04/28/2019 12/26/18   Carrie Mew, MD  aspirin EC 81 MG EC tablet Take 1 tablet (81 mg total) by mouth daily. 08/15/17   Isaiah Serge, NP  atorvastatin (LIPITOR) 80 MG tablet Take 1 tablet (80 mg total) by mouth daily at 6 PM. 08/14/17   Isaiah Serge, NP  cetirizine (ZYRTEC) 10 MG tablet Take 10 mg by mouth daily.    [provider]  fluticasone (FLONASE) 50 MCG/ACT nasal spray Place 1 spray into both nostrils daily.    [provider]  HYDROcodone-acetaminophen (NORCO/VICODIN) 5-325 MG tablet Take 1 tablet by mouth every 6 (six) hours as needed. Pt to take twice a day as needed.    [provider]  insulin detemir (LEVEMIR) 100 UNIT/ML injection Inject 0.5 mLs (50 Units total) into the skin 2 (two) times daily. 08/12/17   [place] Lighter,  MD  irbesartan (AVAPRO) 300 MG tablet TAKE ONE TABLET EVERY DAY 06/05/18   Isaiah Serge, NP  loratadine (CLARITIN) 10 MG tablet Take 1 tablet (10 mg total) by mouth daily as needed for allergies. 08/12/17   [place] Lighter, MD  metoprolol tartrate (LOPRESSOR) 25 MG tablet Take 1 tablet (25 mg total) by mouth 2 (two) times daily. 08/14/17   Isaiah Serge, NP  montelukast (SINGULAIR) 10 MG tablet Take 1 tablet by mouth daily. 07/10/17   [provider]  multivitamin-iron-minerals-folic acid (CENTRUM) chewable tablet Chew 1 tablet by mouth daily.    [provider]  nicotine (NICODERM CQ - DOSED IN MG/24 HOURS) 14 mg/24hr patch Place 1 patch (14 mg total) onto the skin daily. Patient taking differently: Place 7 mg onto the skin daily.  08/14/17   Isaiah Serge, NP  nitroGLYCERIN (NITROSTAT) 0.4 MG SL tablet PLACE 1 TABLET UNDER TONGUE EVERY 5 MIN AS NEEDED FOR CHEST PAIN IF NO RELIEF IN15 MIN CALL 911 (MAX 3 TABS) 06/29/19   Isaiah Serge, NP  ondansetron (ZOFRAN ODT) 4 MG disintegrating tablet  Take 1 tablet (4 mg total) by mouth every 8 (eight) hours as needed for nausea or vomiting. 12/26/18   Carrie Mew, MD  predniSONE (DELTASONE) 20 MG tablet Take 60 mg daily x 2 days then 40 mg daily x 2 days then 20 mg daily x 2 days 06/26/19   Drenda Freeze, MD  sitaGLIPtin-metformin (JANUMET) 50-1000 MG tablet Take 1 tablet by mouth 2 (two) times daily with a meal.    [provider]  ticagrelor (BRILINTA) 90 MG TABS tablet Take 1 tablet (90 mg total) by mouth 2 (two) times daily. 08/14/17   Isaiah Serge, NP    No Known Allergies  Family History  Problem Relation Age of Onset  . Heart attack Mother   . Melanoma Father   . Parkinsonism Father     Social History Social History   Tobacco Use  . Smoking status: Former Smoker    Packs/day: 1.00    Years: 50.00    Pack years: 50.00    Types: Cigarettes    Quit date: 08/2017    Years since quitting: 2.3  .  Smokeless tobacco: Never Used  . Tobacco comment: Has Quit as of May 2019- reviewed relaspe and will continue to followup  Vaping Use  . Vaping Use: Never used  Substance Use Topics  . Alcohol use: No  . Drug use: No    Review of Systems Constitutional: Negative for fever. Cardiovascular: Negative for chest pain. Respiratory: Positive for shortness of breath.  Occasional cough. Gastrointestinal: Negative for abdominal pain, vomiting  Musculoskeletal: Negative for musculoskeletal complaints Neurological: Negative for headache All other ROS negative  ____________________________________________   PHYSICAL EXAM:  VITAL SIGNS: ED Triage Vitals  Enc Vitals Group     BP 12/24/19 1756 109/67     Pulse Rate 12/24/19 1756 79     Resp 12/24/19 1756 20     Temp 12/24/19 1756 98.7 F (37.1 C)     Temp Source 12/24/19 1756 Oral     SpO2 12/24/19 1756 (!) 89 %     Weight 12/24/19 1747 230 lb (104.3 kg)     Height 12/24/19 1747 6' (1.829 m)     Head Circumference --      Peak Flow --      Pain Score 12/24/19 1747 0     Pain Loc --      Pain Edu? --      Excl. in [place]? --    Constitutional: Alert and oriented. Well appearing and in no distress. Eyes: Normal exam ENT      Head: Normocephalic and atraumatic.      Mouth/Throat: Mucous membranes are moist. Cardiovascular: Normal rate, regular rhythm. No murmur Respiratory: Normal respiratory effort without tachypnea nor retractions. Breath sounds are clear, no obvious wheeze rales or rhonchi. Gastrointestinal: Soft and nontender. No distention.   Musculoskeletal: Mild lower extremity edema bilaterally with left greater than right. Neurologic:  Normal speech and language. No gross focal neurologic deficits  Skin:  Skin is warm, dry and intact.  Psychiatric: Mood and affect are normal.   ____________________________________________    EKG  EKG viewed and interpreted by myself shows a sinus rhythm at 73 bpm with a narrow QRS,  normal axis, normal intervals, nonspecific ST changes.  ____________________________________________    RADIOLOGY  Ultrasound of the left lower extremity is negative.  Chest x-ray shows chronic lung scarring however increased haziness in the right mid and lower lungs. ____________________________________________   INITIAL IMPRESSION /  ASSESSMENT AND PLAN / ED COURSE  Pertinent labs & imaging results that were available during my care of the patient were reviewed by me and considered in my medical decision making (see chart for details).   Patient presents to the emergency department for worsening shortness of breath today since this morning.  Patient does have peripheral edema.  Reassuringly his labs are normal including negative troponin, normal BNP.  Ultrasound is negative for DVT.  Chest x-ray does show increased haziness on the right side.  Given the patient's shortness of breath initially mildly hypoxic around 89 to 90% on room air we will obtain a CTA of the chest to rule out PE versus pneumonia versus pulmonary edema.  Patient agreeable to plan of care.  Overall patient appears extremely well.  No distress at this time.  Sitting in bed with no increased work of breathing.  CTA of the chest is essentially negative.  Patient has desatted now to 87% on room air, placed back on 2 L nasal cannula oxygen.  Highly suspect more of a COPD exacerbation.  Covid test is negative.  We will admit to the hospital service for further work-up and treatment.  Henry [name] was evaluated in Emergency Department on 12/24/2019 for the symptoms described in the history of present illness. He was evaluated in the context of the global COVID-19 pandemic, which necessitated consideration that the patient might be at risk for infection with the SARS-CoV-2 virus that causes COVID-19. Institutional protocols and algorithms that pertain to the evaluation of patients at risk for COVID-19 are in a state of rapid  change based on information released by regulatory bodies including the CDC and federal and state organizations. These policies and algorithms were followed during the patient's care in the ED.  ____________________________________________   FINAL CLINICAL IMPRESSION(S) / ED DIAGNOSES  Dyspnea COPD exacerbation   Harvest Dark, MD 12/24/19 2245

## 2019-12-24 NOTE — ED Notes (Signed)
Pt leaving for CT.  

## 2019-12-24 NOTE — ED Triage Notes (Addendum)
Pt here for acute onset SHOB today. Has had productive cough. Swelling to LLE over last week.  Unlabored at this time. Mild hypoxia in triage. No fever.  [place] 2 L placed on pt.

## 2019-12-25 ENCOUNTER — Other Ambulatory Visit: Payer: Self-pay

## 2019-12-25 ENCOUNTER — Inpatient Hospital Stay
Admit: 2019-12-25 | Discharge: 2019-12-25 | Disposition: A | Discharge status: Home / Self Care | Payer: Medicare HMO | Attending: Internal Medicine | Admitting: Internal Medicine

## 2019-12-25 DIAGNOSIS — R0609 Other forms of dyspnea: Secondary | ICD-10-CM

## 2019-12-25 DIAGNOSIS — J9601 Acute respiratory failure with hypoxia: Principal | ICD-10-CM

## 2019-12-25 LAB — CBC
HCT: 43.5 % (ref 39.0–52.0)
Hemoglobin: 14.8 g/dL (ref 13.0–17.0)
MCH: 26.9 pg (ref 26.0–34.0)
MCHC: 34 g/dL (ref 30.0–36.0)
MCV: 79.1 fL — ABNORMAL LOW (ref 80.0–100.0)
Platelets: 300 10*3/uL (ref 150–400)
RBC: 5.5 MIL/uL (ref 4.22–5.81)
RDW: 17 % — ABNORMAL HIGH (ref 11.5–15.5)
WBC: 8.5 10*3/uL (ref 4.0–10.5)
nRBC: 0 % (ref 0.0–0.2)

## 2019-12-25 LAB — IRON AND TIBC
Iron: 30 ug/dL — ABNORMAL LOW (ref 45–182)
Saturation Ratios: 7 % — ABNORMAL LOW (ref 17.9–39.5)
TIBC: 442 ug/dL (ref 250–450)
UIBC: 412 ug/dL

## 2019-12-25 LAB — GLUCOSE, CAPILLARY
Glucose-Capillary: 328 mg/dL — ABNORMAL HIGH (ref 70–99)
Glucose-Capillary: 295 mg/dL — ABNORMAL HIGH (ref 70–99)

## 2019-12-25 LAB — URINALYSIS, COMPLETE (UACMP) WITH MICROSCOPIC
Bacteria, UA: NONE SEEN
Bilirubin Urine: NEGATIVE
Glucose, UA: >=500 mg/dL — AB
Hgb urine dipstick: NEGATIVE
Ketones, ur: 5 mg/dL — AB
Leukocytes,Ua: NEGATIVE
Nitrite: NEGATIVE
Protein, ur: NEGATIVE mg/dL
Specific Gravity, Urine: 1.031 — ABNORMAL HIGH (ref 1.005–1.030)
Squamous Epithelial / HPF: NONE SEEN (ref 0–5)
pH: 6 (ref 5.0–8.0)

## 2019-12-25 LAB — BLOOD GAS, VENOUS
Acid-Base Excess: 6.3 mmol/L — ABNORMAL HIGH (ref 0.0–2.0)
Bicarbonate: 32.3 mmol/L — ABNORMAL HIGH (ref 20.0–28.0)
O2 Saturation: 69.7 %
Patient temperature: 37
pCO2, Ven: 51 mmHg (ref 44.0–60.0)
pH, Ven: 7.41 (ref 7.250–7.430)
pO2, Ven: 36 mmHg (ref 32.0–45.0)

## 2019-12-25 LAB — CREATININE, SERUM
Creatinine, Ser: 1.4 mg/dL — ABNORMAL HIGH (ref 0.61–1.24)
GFR calc Af Amer: 57 mL/min — ABNORMAL LOW (ref >60)
GFR calc non Af Amer: 49 mL/min — ABNORMAL LOW (ref >60)

## 2019-12-25 LAB — HIV ANTIBODY (ROUTINE TESTING W REFLEX): HIV Screen 4th Generation wRfx: NONREACTIVE

## 2019-12-25 LAB — FERRITIN: Ferritin: 14 ng/mL — ABNORMAL LOW (ref 24–336)

## 2019-12-25 MED ORDER — ASPIRIN 81 MG PO TBEC
81.0000 mg | DELAYED_RELEASE_TABLET | Freq: Every day | ORAL | Status: DC
  Administered 2019-12-25: 81 mg via ORAL
  Filled 2019-12-25 (×2): qty 1

## 2019-12-25 MED ORDER — FLUTICASONE FUROATE-VILANTEROL 200-25 MCG/INH IN AEPB
1.0000 | INHALATION_SPRAY | Freq: Every day | RESPIRATORY_TRACT | Status: DC
  Administered 2019-12-25: 12:00:00 1 via RESPIRATORY_TRACT
  Filled 2019-12-25: qty 28

## 2019-12-25 MED ORDER — ATORVASTATIN CALCIUM 20 MG PO TABS: 80.0000 mg | ORAL_TABLET | Freq: Every day | ORAL | Status: DC

## 2019-12-25 MED ORDER — IRBESARTAN 150 MG PO TABS
150.0000 mg | ORAL_TABLET | Freq: Every day | ORAL | Status: DC
  Administered 2019-12-25: 150 mg via ORAL
  Filled 2019-12-25: qty 1

## 2019-12-25 MED ORDER — METOPROLOL TARTRATE 25 MG PO TABS
25.0000 mg | ORAL_TABLET | Freq: Two times a day (BID) | ORAL | Status: DC
  Administered 2019-12-25: 25 mg via ORAL
  Filled 2019-12-25: qty 1

## 2019-12-25 MED ORDER — LABETALOL HCL 5 MG/ML IV SOLN
10.0000 mg | Freq: Once | INTRAVENOUS | Status: AC
  Administered 2019-12-25: 10 mg via INTRAVENOUS
  Filled 2019-12-25: qty 4

## 2019-12-25 MED ORDER — FUROSEMIDE 10 MG/ML IJ SOLN
20.0000 mg | Freq: Two times a day (BID) | INTRAMUSCULAR | Status: DC
  Administered 2019-12-25: 20 mg via INTRAVENOUS
  Filled 2019-12-25: qty 4

## 2019-12-25 MED ORDER — MONTELUKAST SODIUM 10 MG PO TABS: 10.0000 mg | ORAL_TABLET | Freq: Every day | ORAL | Status: DC

## 2019-12-25 NOTE — ED Notes (Signed)
Pt assisted to the bathroom. Pt instructed to pull the red call bell when done

## 2019-12-25 NOTE — Hospital Course (Signed)
Henry [name] is a 74 y.o. male with medical history significant for CAD status post stent angioplasty, HTN, COPD, who presented with shortness of breath with exertion for the past 24 hours.   It was preceded by 3-week history of bilateral lower extremity edema. His cardiologist started him on Lasix just over 2 weeks ago with plans for outpatient echocardiogram in the upcoming weeks.  The Lasix improved his swelling but over the past 24 hours he noted recurrence of the swelling in the left leg, now associated with weakness and dyspnea on exertion.   He denied chest pain or palpitations..  He has an occasional cough but denies wheezing or increased need for the use of any inhalers.  He denies fever or chills.  He is fully vaccinated against Covid.  On arrival in the emergency room his vitals were within normal limits however he had mild desaturation with ambulation to about 89%.  His chest x-ray was clear.  EKG with no acute ST-T wave abnormality.  Troponin negative x2 at 5/5, normal BNP, Covid negative.   Left lower extremity duplex  negative.  He had a CTA chest that was negative for PE.    Patient was given DuoNeb treatment but felt no difference.  On ambulation hoping for discharge he desaturated to 87%.  He was placed on O2.  Hospitalist consulted for admission.  While being evaluated in the emergency room, patient complained of generalized malaise while lying flat improving with sitting upright though not appearing overtly short of breath. He again wanted to be discharged home the morning after admission and again ambulated and desaturated to 87% on RA and was placed back on oxygen. He even desatted at rest in bed on RA to 86% during physician evaluation.   Furthermore, his BP began trending up (patient endorsed good control at home on his regimen), however BP was continuously elevated (during 2nd physical exam his BP was 157/135) which was cycled twice and confirmed high. He was also told his  echo had not been read yet, plus needed further evaluation for his hypoxia plus to arrange home O2 and still treat his HTN further.  Patient declined to remain in the hospital for further workup of hypoxia, dyspnea, and elevated BP. He voiced understanding to the risks of leaving AMA and still decided to leave. He did not want home O2 arranged and did not wish to remain in the hospital. He was instructed to return to the ER if he again felt even worse when going home.

## 2019-12-25 NOTE — ED Notes (Signed)
Messaged provider Damita Dunnings via secure chat to determine whether looking for repeat EKG or okay with initial EKG for baseline.

## 2019-12-25 NOTE — ED Notes (Signed)
Called resp therapy to notify [facility] sent on ice to lab. State they will pick it up soon.

## 2019-12-25 NOTE — ED Notes (Signed)
OT at bedside. Pt requesting to talk to the MD because he feels that he doesn't need to stay. MD made aware.

## 2019-12-25 NOTE — ED Notes (Signed)
Pt dec to 89% on 2L while laying in bed attempting to sleep so pt inc to 3L. Will inc to 4L if necessary. Pt tends to do best sitting up some rather than laying flat although prefers to lay flat.

## 2019-12-25 NOTE — ED Notes (Signed)
Attending provider to bedside.

## 2019-12-25 NOTE — ED Notes (Signed)
Attempted to call report. Floor not taking anymore pt's at this time.

## 2019-12-25 NOTE — Progress Notes (Signed)
Physical Therapy Evaluation Patient Details Name: Henry [name] MRN: [id number] DOB: 1945/07/27 Today's Date: 12/25/2019   History of Present Illness  Per MD note:Henry [name] is a 74 y.o. male with medical history significant for CAD status post stent angioplasty, HTN, COPD, who presents with shortness of breath with exertion for the past 24 hours.  It was preceded by 3-week history of bilateral lower extremity edema,   His cardiologist started him on Lasix just over 2 weeks ago with plans for outpatient echocardiogram in the upcoming weeks.  The Lasix improved his swelling but over the past 24 hours he noted recurrence of the swelling in the left leg, now associated with weakness and dyspnea on exertion.  He denies chest pain or palpitations..  He has an occasional cough but denies wheezing or increased need for the use of any inhalers.  He denies fever or chills.  He is fully vaccinated against Covid. ED Course: On arrival in the emergency room his vitals were within normal limits however he had mild desaturation with ambulation to about 89%.  His chest x-ray was clear.  EKG with no acute ST-T wave abnormality.  Troponin negative x2 at 5/5, normal BNP, Covid negative.  Left lower extremity Doppler negative.  He had a CTA chest that was negative for PE.  Patient was given DuoNeb treatment but felt no difference.  On ambulation hoping for discharge he desaturated to 87%.  He was placed on O2. Hospitalist consulted for admission.  While being evaluated in the emergency room, patient complained of generalized malaise while lying flat improving with sitting upright though not appearing overtly short of breath.  Clinical Impression  Patient agrees to PT evaluation. Pt reports no pain. Pt lives alone with a 3 steps entry into his home. Pt ambulated with [facility] prior to this hospital admission. Pt has 4/5 strength BLE hip and knee and is I for bed mobility, MI for transfers sit to stand with HHA. Pt  unable to ambulate today due to increased RR from 18-35 with marching in place for 20-30 sec x 2 trials. Pt has WNL static sitting balance and static/dynamic standing balance.  Pt will continue to benefit from skilled PT to improve mobility and strength.    Follow Up Recommendations Home health PT    Equipment Recommendations  None recommended by PT    Recommendations for Other Services       Precautions / Restrictions Precautions Precautions: Fall Restrictions Weight Bearing Restrictions: No      Mobility  Bed Mobility Overal bed mobility: Independent                Transfers Overall transfer level: Modified independent Equipment used: 1 person hand held assist                Ambulation/Gait Ambulation/Gait assistance:  (RR increased from 18-38 after 30 sec of standing marching) Gait Distance (Feet):  (02 saturation greater than 90%, standing 30 sec with RR 38)            Stairs            Wheelchair Mobility    Modified Rankin (Stroke Patients Only)       Balance Overall balance assessment: Independent                                           Pertinent Vitals/Pain Pain  Assessment: No/denies pain    Home Living Family/patient expects to be discharged to:: Private residence Living Arrangements: Alone     Home Access: Stairs to enter Entrance Stairs-Rails: Right Entrance Stairs-Number of Steps: 3 Home Layout: One level Home Equipment: [place] - single point;Shower seat;Grab bars - tub/shower      Prior Function Level of Independence: Independent with assistive device(s)               Hand Dominance        Extremity/Trunk Assessment   Upper Extremity Assessment Upper Extremity Assessment: Overall WFL for tasks assessed    Lower Extremity Assessment Lower Extremity Assessment: Overall WFL for tasks assessed       Communication   Communication: No difficulties  Cognition Arousal/Alertness:  Awake/alert Behavior During Therapy: WFL for tasks assessed/performed Overall Cognitive Status: Within Functional Limits for tasks assessed                                        General Comments      Exercises     Assessment/Plan    PT Assessment Patient needs continued PT services  PT Problem List Decreased activity tolerance;Cardiopulmonary status limiting activity       PT Treatment Interventions Gait training;Therapeutic activities    PT Goals (Current goals can be found in the Care Plan section)  Acute Rehab PT Goals Patient Stated Goal: to go home PT Goal Formulation: Patient unable to participate in goal setting Time For Goal Achievement: 01/08/20 Potential to Achieve Goals: Good    Frequency Min 2X/week   Barriers to discharge Decreased caregiver support      Co-evaluation               AM-PAC PT "6 Clicks" Mobility  Outcome Measure Help needed turning from your back to your side while in a flat bed without using bedrails?: None Help needed moving from lying on your back to sitting on the side of a flat bed without using bedrails?: None Help needed moving to and from a bed to a chair (including a wheelchair)?: None Help needed standing up from a chair using your arms (e.g., wheelchair or bedside chair)?: A Little Help needed to walk in hospital room?: A Little Help needed climbing 3-5 steps with a railing? : A Lot 6 Click Score: 20    End of Session Equipment Utilized During Treatment: Gait belt;Oxygen Activity Tolerance: Treatment limited secondary to medical complications (Comment);Other (comment) Patient left: in bed Nurse Communication: Mobility status PT Visit Diagnosis: Difficulty in walking, not elsewhere classified (R26.2)    Time: 1030-1055 PT Time Calculation (min) (ACUTE ONLY): 25 min   Charges:   PT Evaluation $PT Eval Low Complexity: 1 Low PT Treatments $Therapeutic Activity: 8-22 mins          Alanson Puls, PT DPT 12/25/2019, 11:44 AM

## 2019-12-25 NOTE — ED Notes (Signed)
Pt given warm blanket. Assisted to edge of bed to use urinal as requested.

## 2019-12-25 NOTE — Evaluation (Signed)
Occupational Therapy Evaluation Patient Details Name: Henry [name] MRN: [id number] DOB: 1946/03/05 Today's Date: 12/25/2019    History of Present Illness Henry [name] is a 74 y.o. male with medical history significant for CAD status post stent angioplasty, HTN, COPD, who presents with shortness of breath with exertion for the past 24 hours.  It was preceded by 3-week history of bilateral lower extremity edema. On ambulation hoping for discharge he desaturated to 87%.  He was placed on O2. Hospitalist consulted for admission.  While being evaluated in the emergency room, patient complained of generalized malaise while lying flat improving with sitting upright though not appearing overtly short of breath.   Clinical Impression   Henry [name] was seen for OT evaluation this date. Prior to hospital admission, pt was Independent in I/ADLs. Pt lives alone in [facility]. Pt presents to acute OT demonstrating impaired ADL performance and functional mobility 2/2 decreased safety awareness and functional balance deficits. Upon entry pt is adamant about contacting MD bc he is ready to go home, RN notified. Pt currently requires CGA + RW - initial stand posterior LOB requiring MIN A to correct, second trial improved to SBA + RW. CGA + RW ~20 ft in room mobility - assist for lines and intermittent LOBs - able to self correct. MOD I self-feeding seated EOB. MIN A for LBD seated EOB - assist for balance on high stretcher height. CGA + RW for ADL t/f. Pt would benefit from skilled OT to address noted impairments and functional limitations (see below for any additional details) in order to maximize safety and independence while minimizing falls risk and caregiver burden. Upon hospital discharge, recommend HHOT to maximize pt safety and return to functional independence during meaningful occupations of daily life.  Seated: SpO2 96% on 2L [name] Standing: 91% on 2L [name]. Mobility: 90% on RA ~ 15 ft, desat to 88%, resolved  c PLB and standing rest break to 90%, return to sitting desat 87%, resolved c 2L [name]       Follow Up Recommendations  Home health OT    Equipment Recommendations  None recommended by OT    Recommendations for Other Services       Precautions / Restrictions Precautions Precautions: Fall Restrictions Weight Bearing Restrictions: No      Mobility Bed Mobility Overal bed mobility: Independent     Transfers Overall transfer level: Needs assistance Equipment used: Rolling walker (2 wheeled) Transfers: Sit to/from Stand Sit to Stand: Min guard;From elevated surface       General transfer comment: CGA + RW - initial stand posterior LOB requiring MIN A to correct, second trial improved to SBA + RW. CGA + RW ~20 ft in room mobility - assist for lines and intermittent LOBs - able to self correct    Balance Overall balance assessment: Needs assistance Sitting-balance support: No upper extremity supported;Feet supported Sitting balance-Leahy Scale: Good     Standing balance support: Single extremity supported Standing balance-Leahy Scale: Good Standing balance comment: Steady static balance c single UE support on RW reaching inside BOS, decreasing during mobility (tandem step when turning)          ADL either performed or assessed with clinical judgement   ADL Overall ADL's : Needs assistance/impaired        General ADL Comments: MOD I self-feeding seated EOB. MIN A for LBD seated EOB - assist for balance on high stretcher height. CGA + RW for ADL t/f.  Pertinent Vitals/Pain Pain Assessment: No/denies pain     Hand Dominance Right   Extremity/Trunk Assessment Upper Extremity Assessment Upper Extremity Assessment: Overall WFL for tasks assessed   Lower Extremity Assessment Lower Extremity Assessment: Overall WFL for tasks assessed       Communication Communication Communication: No difficulties   Cognition Arousal/Alertness:  Awake/alert Behavior During Therapy: WFL for tasks assessed/performed Overall Cognitive Status: Within Functional Limits for tasks assessed        General Comments  Seated: SpO2 96% on 2L [place]. Standing: 91% on 2L [name], removed for in room mobility. mOBILITY: 90% on RA ~ 15 ft, desat to 88%, resolved c PLB and standing rest break. Sitting: 86%, resolved c 2L [name].     Exercises Exercises: Other exercises Other Exercises Other Exercises: Pt educated re: OT role, DME recs, d/c recs, importance of mobility for functional strengthening, ECS, falls prevention Other Exercises: Self-feeding, toilet t/f, sup>sit, sit<>stand x2, ~20 ft in room mobility, sitting/standing balance/tolerance, functional reach   Shoulder Instructions      Home Living Family/patient expects to be discharged to:: Private residence Living Arrangements: Alone   Type of Home: House Home Access: Stairs to enter [employer] of Steps: 3 Entrance Stairs-Rails: Right Home Layout: One level               Home Equipment: Cane - single point;Shower seat;Grab bars - tub/shower   Additional Comments: single step into kitchen (has rail)      Prior Functioning/Environment Level of Independence: Independent with assistive device(s)        Comments: Reports using SPC intermittently, no assist for ADLs        OT Problem List: Decreased activity tolerance;Impaired balance (sitting and/or standing);Decreased safety awareness;Decreased knowledge of use of DME or AE      OT Treatment/Interventions: Self-care/ADL training;Therapeutic exercise;Energy conservation;DME and/or AE instruction;Therapeutic activities;Patient/family education;Balance training    OT Goals(Current goals can be found in the care plan section) Acute Rehab OT Goals Patient Stated Goal: to go home OT Goal Formulation: With patient Time For Goal Achievement: 01/08/20 Potential to Achieve Goals: Good ADL Goals Pt Will Perform Grooming: with  modified independence;standing (c LRAD PRN for >8 mins) Pt Will Perform Lower Body Dressing: sit to/from stand;with supervision (c LRAD PRN) Pt Will Transfer to Toilet: with modified independence;ambulating;regular height toilet (c LRAD PRN and no VCs for ECS)  OT Frequency: Min 1X/week   Barriers to D/C: Inaccessible home environment;Decreased caregiver support          Co-evaluation              AM-PAC OT "6 Clicks" Daily Activity     Outcome Measure Help from another person eating meals?: None Help from another person taking care of personal grooming?: None Help from another person toileting, which includes using toliet, bedpan, or urinal?: A Little Help from another person bathing (including washing, rinsing, drying)?: A Little Help from another person to put on and taking off regular upper body clothing?: None Help from another person to put on and taking off regular lower body clothing?: A Little 6 Click Score: 21   End of Session Equipment Utilized During Treatment: Rolling walker;Oxygen (2L [place]) Nurse Communication: Mobility status;Other (comment) (Pt requesting MD)  Activity Tolerance: Patient tolerated treatment well Patient left: in bed;with call bell/phone within reach  OT Visit Diagnosis: Other abnormalities of gait and mobility (R26.89)                Time: 3557-3220 OT  Time Calculation (min): 16 min Charges:  OT General Charges $OT Visit: 1 Visit OT Evaluation $OT Eval Moderate Complexity: 1 Mod OT Treatments $Self Care/Home Management : 8-22 mins  Dessie Coma, M.S. OTR/L  12/25/19, 2:23 PM  ascom [phone]

## 2019-12-25 NOTE — ED Notes (Signed)
Pt ambulated with physician - sats 86% on RA with exertion. Weaned to 2L now. Goal of 90% sats per MD

## 2019-12-25 NOTE — ED Notes (Signed)
Pt 88-90% while on 4L. Pt had been attempting to sleep. Provider Damita Dunnings notified. Pt repositioned and prompted to take deep breaths.

## 2019-12-25 NOTE — Discharge Summary (Addendum)
Physician Discharge Summary  Henry [name] VVO:[id number] DOB: 1945-07-23 DOA: 12/24/2019  PCP: Juluis Pitch, MD  Admit date: 12/24/2019 Discharge date: 12/25/2019  Admitted From: home Disposition:  Home; patient left AMA Admitting physician: Dr. Damita Dunnings Discharging physician: Dwyane Dee, MD  Recommendations for Outpatient Follow-up:  1. Follow up echo results; still pending at time that patient left AMA 2. Further workup for hypoxia if still present   Patient left AMA  CODE STATUS: Full Diet recommendation:  Diet Orders (From admission, onward)    Start     Ordered   12/24/19 2313  Diet heart healthy/carb modified Room service appropriate? Yes; Fluid consistency: Thin  Diet effective now       Question Answer Comment  Diet-HS Snack? Nothing   Room service appropriate? Yes   Fluid consistency: Thin      12/24/19 2314          Hospital Course: Henry [name] is a 74 y.o. male with medical history significant for CAD status post stent angioplasty, HTN, COPD, who presented with shortness of breath with exertion for the past 24 hours.   It was preceded by 3-week history of bilateral lower extremity edema. His cardiologist started him on Lasix just over 2 weeks ago with plans for outpatient echocardiogram in the upcoming weeks.  The Lasix improved his swelling but over the past 24 hours he noted recurrence of the swelling in the left leg, now associated with weakness and dyspnea on exertion.   He denied chest pain or palpitations..  He has an occasional cough but denies wheezing or increased need for the use of any inhalers.  He denies fever or chills.  He is fully vaccinated against Covid.  On arrival in the emergency room his vitals were within normal limits however he had mild desaturation with ambulation to about 89%.  His chest x-ray was clear.  EKG with no acute ST-T wave abnormality.  Troponin negative x2 at 5/5, normal BNP, Covid negative.   Left lower extremity  duplex  negative.  He had a CTA chest that was negative for PE.    Patient was given DuoNeb treatment but felt no difference.  On ambulation hoping for discharge he desaturated to 87%.  He was placed on O2.  Hospitalist consulted for admission.  While being evaluated in the emergency room, patient complained of generalized malaise while lying flat improving with sitting upright though not appearing overtly short of breath. He again wanted to be discharged home the morning after admission and again ambulated and desaturated to 87% on RA and was placed back on oxygen. He even desatted at rest in bed on RA to 86% during physician evaluation.   Furthermore, his BP began trending up (patient endorsed good control at home on his regimen), however BP was continuously elevated (during 2nd physical exam his BP was 157/135) which was cycled twice and confirmed high. He was also told his echo had not been read yet, plus needed further evaluation for his hypoxia plus to arrange home O2 and still treat his HTN further.  Patient declined to remain in the hospital for further workup of hypoxia, dyspnea, and elevated BP. He voiced understanding to the risks of leaving AMA and still decided to leave. He did not want home O2 arranged and did not wish to remain in the hospital. He was instructed to return to the ER if he again felt even worse when going home.    No problem-specific Assessment & Plan notes found  for this encounter.    The patient's chronic medical conditions were treated accordingly per the patient's home medication regimen except as noted.  On day of discharge, patient was felt deemed stable for discharge. Patient/family member advised to call PCP or come back to ER if needed.   Discharge Diagnoses:   Principal Diagnosis: Acute respiratory failure with hypoxia [facility])  Active Hospital Problems   Diagnosis Date Noted  . Acute respiratory failure with hypoxia ([place]) 12/25/2019  . Dyspnea on exertion  12/25/2019  . Hypoxia 12/24/2019  . CAD in native artery 08/12/2017  . COPD (chronic obstructive pulmonary disease) ([place]) 06/02/2017  . HTN (hypertension) 01/25/2016  . Diabetes ([place]) 01/25/2016    Resolved Hospital Problems  No resolved problems to display.     Allergies as of 12/25/2019   No Known Allergies     No Known Allergies  Consultations: none  Discharge Exam: BP (!) 143/73   Pulse 90   Temp 97.6 F (36.4 C) (Oral)   Resp 18   Ht 6' (1.829 m)   Wt 104.3 kg   SpO2 92%   BMI 31.19 kg/m  General appearance: alert, cooperative and no distress Head: Normocephalic, without obvious abnormality, atraumatic Eyes: EOMI Lungs: scattered coarse sounds bilaterally; no wheezing Heart: regularly irregular rhythm and S1, S2 normal Abdomen: normal findings: bowel sounds normal and soft, non-tender Extremities: 2+ B/L LE pitting edema Skin: mobility and turgor normal Neurologic: Grossly normal  The results of significant diagnostics from this hospitalization (including imaging, microbiology, ancillary and laboratory) are listed below for reference.   Microbiology: Recent Results (from the past 240 hour(s))  SARS Coronavirus 2 by RT PCR (hospital order, performed in [facility] hospital lab) Nasopharyngeal Nasopharyngeal Swab     Status: None   Collection Time: 12/24/19  9:05 PM   Specimen: Nasopharyngeal Swab  Result Value Ref Range Status   SARS Coronavirus 2 NEGATIVE NEGATIVE Final    Comment: (NOTE) SARS-CoV-2 target nucleic acids are NOT DETECTED.  The SARS-CoV-2 RNA is generally detectable in upper and lower respiratory specimens during the acute phase of infection. The lowest concentration of SARS-CoV-2 viral copies this assay can detect is 250 copies / mL. A negative result does not preclude SARS-CoV-2 infection and should not be used as the sole basis for treatment or other patient management decisions.  A negative result may occur with improper specimen  collection / handling, submission of specimen other than nasopharyngeal swab, presence of viral mutation(s) within the areas targeted by this assay, and inadequate number of viral copies (<250 copies / mL). A negative result must be combined with clinical observations, patient history, and epidemiological information.  Fact Sheet for Patients:   StrictlyIdeas.no  Fact Sheet for Healthcare Providers: BankingDealers.co.za  This test is not yet approved or  cleared by the [place] FDA and has been authorized for detection and/or diagnosis of SARS-CoV-2 by FDA under an Emergency Use Authorization (EUA).  This EUA will remain in effect (meaning this test can be used) for the duration of the COVID-19 declaration under Section 564(b)(1) of the Act, 21 U.S.C. section 360bbb-3(b)(1), unless the authorization is terminated or revoked sooner.  Performed at [facility], [address] [place], [place] [address]      Labs: BNP (last 3 results) Recent Labs    12/24/19 1800  BNP 82.9   Basic Metabolic Panel: Recent Labs  Lab 12/24/19 1800 12/25/19 0459  NA 136  --   K 4.0  --  CL 98  --   CO2 27  --   GLUCOSE 153*  --   BUN 20  --   CREATININE 1.24 1.40*  CALCIUM 9.7  --    Liver Function Tests: Recent Labs  Lab 12/24/19 1800  AST 25  ALT 21  ALKPHOS 61  BILITOT 0.7  PROT 7.3  ALBUMIN 4.0   No results for input(s): LIPASE, AMYLASE in the last 168 hours. No results for input(s): AMMONIA in the last 168 hours. CBC: Recent Labs  Lab 12/24/19 1800 12/25/19 0459  WBC 10.5 8.5  NEUTROABS 6.8  --   HGB 13.5 14.8  HCT 42.1 43.5  MCV 82.2 79.1*  PLT 277 300   Cardiac Enzymes: No results for input(s): CKTOTAL, CKMB, CKMBINDEX, TROPONINI in the last 168 hours. BNP: Invalid input(s): POCBNP CBG: Recent Labs  Lab 12/24/19 2344 12/25/19 0830 12/25/19 1216  GLUCAP 218* 328* 295*   D-Dimer No results  for input(s): DDIMER in the last 72 hours. Hgb A1c No results for input(s): HGBA1C in the last 72 hours. Lipid Profile No results for input(s): CHOL, HDL, LDLCALC, TRIG, CHOLHDL, LDLDIRECT in the last 72 hours. Thyroid function studies No results for input(s): TSH, T4TOTAL, T3FREE, THYROIDAB in the last 72 hours.  Invalid input(s): FREET3 Anemia work up Recent Labs    12/25/19 0459  FERRITIN 14*  TIBC 442  IRON 30*   Urinalysis    Component Value Date/Time   COLORURINE YELLOW (A) 12/25/2019 0123   APPEARANCEUR CLEAR (A) 12/25/2019 0123   LABSPEC 1.031 (H) 12/25/2019 0123   PHURINE 6.0 12/25/2019 0123   GLUCOSEU >=500 (A) 12/25/2019 0123   HGBUR NEGATIVE 12/25/2019 0123   BILIRUBINUR NEGATIVE 12/25/2019 0123   KETONESUR 5 (A) 12/25/2019 0123   PROTEINUR NEGATIVE 12/25/2019 0123   NITRITE NEGATIVE 12/25/2019 0123   LEUKOCYTESUR NEGATIVE 12/25/2019 0123   Sepsis Labs Invalid input(s): PROCALCITONIN,  WBC,  LACTICIDVEN Microbiology Recent Results (from the past 240 hour(s))  SARS Coronavirus 2 by RT PCR (hospital order, performed in [place] hospital lab) Nasopharyngeal Nasopharyngeal Swab     Status: None   Collection Time: 12/24/19  9:05 PM   Specimen: Nasopharyngeal Swab  Result Value Ref Range Status   SARS Coronavirus 2 NEGATIVE NEGATIVE Final    Comment: (NOTE) SARS-CoV-2 target nucleic acids are NOT DETECTED.  The SARS-CoV-2 RNA is generally detectable in upper and lower respiratory specimens during the acute phase of infection. The lowest concentration of SARS-CoV-2 viral copies this assay can detect is 250 copies / mL. A negative result does not preclude SARS-CoV-2 infection and should not be used as the sole basis for treatment or other patient management decisions.  A negative result may occur with improper specimen collection / handling, submission of specimen other than nasopharyngeal swab, presence of viral mutation(s) within the areas targeted by this  assay, and inadequate number of viral copies (<250 copies / mL). A negative result must be combined with clinical observations, patient history, and epidemiological information.  Fact Sheet for Patients:   StrictlyIdeas.no  Fact Sheet for Healthcare Providers: BankingDealers.co.za  This test is not yet approved or  cleared by the [place] FDA and has been authorized for detection and/or diagnosis of SARS-CoV-2 by FDA under an Emergency Use Authorization (EUA).  This EUA will remain in effect (meaning this test can be used) for the duration of the COVID-19 declaration under Section 564(b)(1) of the Act, 21 U.S.C. section 360bbb-3(b)(1), unless the authorization is terminated or revoked  sooner.  Performed at [facility], [address], [place], [place] [address]     Procedures/Studies: DG Chest 2 View  Result Date: 12/24/2019 CLINICAL DATA:  Shortness of breath.  Productive cough. EXAM: CHEST - 2 VIEW COMPARISON:  June 26, 2019 and Sep 01, 2019 FINDINGS: Chronic architectural changes are seen in the lung bases. However, superimposed haziness has worsened since June 26, 2019, particularly in the right mid lung. The heart, hila, mediastinum, lungs, and pleura are otherwise unremarkable. IMPRESSION: 1. There is a background of chronic architectural distortion in the bases, right greater than left. However, there is increasing haziness and opacity in the right mid lung in the lower lungs bilaterally which is progressive since March 2021 but similar since May 2021. CT imaging is recommended for better evaluation. Electronically Signed   By: Dorise Bullion III M.D   On: 12/24/2019 18:51   CT Angio Chest PE W and/or Wo Contrast  Result Date: 12/24/2019 CLINICAL DATA:  74 year old male with chest pain, lower extremity edema, shortness of breath, productive cough. EXAM: CT ANGIOGRAPHY CHEST WITH CONTRAST TECHNIQUE: Multidetector CT  imaging of the chest was performed using the standard protocol during bolus administration of intravenous contrast. Multiplanar CT image reconstructions and MIPs were obtained to evaluate the vascular anatomy. CONTRAST:  119mL OMNIPAQUE IOHEXOL 350 MG/ML SOLN COMPARISON:  Chest radiographs earlier today.  CTA chest 09/01/2019. FINDINGS: Cardiovascular: Good contrast bolus timing in the pulmonary arterial tree. No focal filling defect identified in the pulmonary arteries to suggest acute pulmonary embolism. Calcified coronary artery atherosclerosis. Calcified aortic atherosclerosis. No cardiomegaly or pericardial effusion. Mediastinum/Nodes: Negative.  No lymphadenopathy. Lungs/Pleura: Stable lung volumes. Subtle centrilobular emphysema suspected, but otherwise stable mild lung base predominant atelectasis and/or scarring. Major airways remain patent, there is chronic perihilar bronchial wall thickening. No acute pulmonary opacity or pleural effusion. Upper Abdomen: Stable and negative visible upper abdomen. Musculoskeletal: No acute osseous abnormality identified. Review of the MIP images confirms the above findings. IMPRESSION: 1. Negative for acute pulmonary embolus. 2. No acute findings in the chest. Stable bilateral lung scarring with chronic airway thickening and subtle underlying emphysema suspected. 3. Calcified coronary artery and Aortic Atherosclerosis (ICD10-I70.0). Electronically Signed   By: Genevie Ann M.D.   On: 12/24/2019 21:34   US Venous Img Lower Unilateral Left  Result Date: 12/24/2019 CLINICAL DATA:  Leg swelling. EXAM: LEFT LOWER EXTREMITY VENOUS DOPPLER ULTRASOUND TECHNIQUE: Gray-scale sonography with compression, as well as color and duplex ultrasound, were performed to evaluate the deep venous system(s) from the level of the common femoral vein through the popliteal and proximal calf veins. COMPARISON:  None. FINDINGS: VENOUS Normal compressibility of the common femoral, superficial femoral,  and popliteal veins, as well as the visualized calf veins. Visualized portions of profunda femoral vein and great saphenous vein unremarkable. No filling defects to suggest DVT on grayscale or color Doppler imaging. Doppler waveforms show normal direction of venous flow, normal respiratory plasticity and response to augmentation. Limited views of the contralateral common femoral vein are unremarkable. OTHER None. Limitations: none IMPRESSION: Negative. Electronically Signed   By: Dorise Bullion III M.D   On: 12/24/2019 18:47     Time coordinating discharge: Over 50 minutes    Dwyane Dee, MD  [facility] 12/25/2019, 5:49 PM Pager: Secure chat  If 7PM-7AM, please contact night-coverage [email or web address] Password [email or web address]

## 2019-12-26 LAB — ECHOCARDIOGRAM COMPLETE
Area-P 1/2: 2.71 cm2
Height: 72 in
S' Lateral: 2.72 cm
Weight: 3680 oz

## 2019-12-26 LAB — HEMOGLOBIN A1C
Hgb A1c MFr Bld: 8.3 % — ABNORMAL HIGH (ref 4.8–5.6)
Mean Plasma Glucose: 191.51 mg/dL

## 2019-12-29 DIAGNOSIS — I10 Essential (primary) hypertension: Secondary | ICD-10-CM | POA: Diagnosis not present

## 2019-12-29 DIAGNOSIS — I25118 Atherosclerotic heart disease of native coronary artery with other forms of angina pectoris: Secondary | ICD-10-CM | POA: Diagnosis not present

## 2019-12-29 DIAGNOSIS — I2510 Atherosclerotic heart disease of native coronary artery without angina pectoris: Secondary | ICD-10-CM | POA: Diagnosis not present

## 2019-12-29 DIAGNOSIS — I5031 Acute diastolic (congestive) heart failure: Secondary | ICD-10-CM | POA: Diagnosis not present

## 2019-12-29 DIAGNOSIS — R0602 Shortness of breath: Secondary | ICD-10-CM | POA: Diagnosis not present

## 2019-12-29 DIAGNOSIS — Z87891 Personal history of nicotine dependence: Secondary | ICD-10-CM | POA: Diagnosis not present

## 2019-12-29 DIAGNOSIS — R0902 Hypoxemia: Secondary | ICD-10-CM | POA: Diagnosis not present

## 2019-12-29 DIAGNOSIS — Z23 Encounter for immunization: Secondary | ICD-10-CM | POA: Diagnosis not present

## 2020-01-18 DIAGNOSIS — J449 Chronic obstructive pulmonary disease, unspecified: Secondary | ICD-10-CM | POA: Diagnosis not present

## 2020-02-07 DIAGNOSIS — D2262 Melanocytic nevi of left upper limb, including shoulder: Secondary | ICD-10-CM | POA: Diagnosis not present

## 2020-02-07 DIAGNOSIS — C44619 Basal cell carcinoma of skin of left upper limb, including shoulder: Secondary | ICD-10-CM | POA: Diagnosis not present

## 2020-02-07 DIAGNOSIS — C44519 Basal cell carcinoma of skin of other part of trunk: Secondary | ICD-10-CM | POA: Diagnosis not present

## 2020-02-07 DIAGNOSIS — D225 Melanocytic nevi of trunk: Secondary | ICD-10-CM | POA: Diagnosis not present

## 2020-02-07 DIAGNOSIS — D2261 Melanocytic nevi of right upper limb, including shoulder: Secondary | ICD-10-CM | POA: Diagnosis not present

## 2020-02-07 DIAGNOSIS — Z85828 Personal history of other malignant neoplasm of skin: Secondary | ICD-10-CM | POA: Diagnosis not present

## 2020-02-07 DIAGNOSIS — D485 Neoplasm of uncertain behavior of skin: Secondary | ICD-10-CM | POA: Diagnosis not present

## 2020-02-07 DIAGNOSIS — X32XXXA Exposure to sunlight, initial encounter: Secondary | ICD-10-CM | POA: Diagnosis not present

## 2020-02-07 DIAGNOSIS — L821 Other seborrheic keratosis: Secondary | ICD-10-CM | POA: Diagnosis not present

## 2020-02-07 DIAGNOSIS — D2271 Melanocytic nevi of right lower limb, including hip: Secondary | ICD-10-CM | POA: Diagnosis not present

## 2020-02-07 DIAGNOSIS — C4401 Basal cell carcinoma of skin of lip: Secondary | ICD-10-CM | POA: Diagnosis not present

## 2020-02-07 DIAGNOSIS — L570 Actinic keratosis: Secondary | ICD-10-CM | POA: Diagnosis not present

## 2020-02-09 DIAGNOSIS — M79641 Pain in right hand: Secondary | ICD-10-CM | POA: Diagnosis not present

## 2020-02-09 DIAGNOSIS — M79642 Pain in left hand: Secondary | ICD-10-CM | POA: Diagnosis not present

## 2020-02-09 DIAGNOSIS — M65332 Trigger finger, left middle finger: Secondary | ICD-10-CM | POA: Diagnosis not present

## 2020-02-09 DIAGNOSIS — G63 Polyneuropathy in diseases classified elsewhere: Secondary | ICD-10-CM | POA: Diagnosis not present

## 2020-02-12 DIAGNOSIS — M50322 Other cervical disc degeneration at C5-C6 level: Secondary | ICD-10-CM | POA: Diagnosis not present

## 2020-02-12 DIAGNOSIS — M542 Cervicalgia: Secondary | ICD-10-CM | POA: Diagnosis not present

## 2020-02-12 DIAGNOSIS — M50323 Other cervical disc degeneration at C6-C7 level: Secondary | ICD-10-CM | POA: Diagnosis not present

## 2020-02-12 DIAGNOSIS — N1831 Chronic kidney disease, stage 3a: Secondary | ICD-10-CM | POA: Diagnosis not present

## 2020-02-18 DIAGNOSIS — M542 Cervicalgia: Secondary | ICD-10-CM | POA: Diagnosis not present

## 2020-02-18 DIAGNOSIS — M5090 Cervical disc disorder, unspecified, unspecified cervical region: Secondary | ICD-10-CM | POA: Diagnosis not present

## 2020-02-21 ENCOUNTER — Other Ambulatory Visit: Payer: Self-pay | Admitting: Physical Medicine and Rehabilitation

## 2020-02-21 DIAGNOSIS — M47812 Spondylosis without myelopathy or radiculopathy, cervical region: Secondary | ICD-10-CM

## 2020-02-21 DIAGNOSIS — M5030 Other cervical disc degeneration, unspecified cervical region: Secondary | ICD-10-CM | POA: Diagnosis not present

## 2020-02-27 ENCOUNTER — Ambulatory Visit
Admission: RE | Admit: 2020-02-27 | Discharge: 2020-02-27 | Disposition: A | Discharge status: Home / Self Care | Payer: Medicare HMO | Source: Ambulatory Visit | Attending: Physical Medicine and Rehabilitation | Admitting: Physical Medicine and Rehabilitation

## 2020-02-27 ENCOUNTER — Other Ambulatory Visit: Payer: Self-pay

## 2020-02-27 DIAGNOSIS — M47812 Spondylosis without myelopathy or radiculopathy, cervical region: Secondary | ICD-10-CM | POA: Insufficient documentation

## 2020-02-27 DIAGNOSIS — M542 Cervicalgia: Secondary | ICD-10-CM | POA: Diagnosis not present

## 2020-03-08 DIAGNOSIS — C44619 Basal cell carcinoma of skin of left upper limb, including shoulder: Secondary | ICD-10-CM | POA: Diagnosis not present

## 2020-03-10 DIAGNOSIS — N200 Calculus of kidney: Secondary | ICD-10-CM | POA: Diagnosis not present

## 2020-03-10 DIAGNOSIS — I10 Essential (primary) hypertension: Secondary | ICD-10-CM | POA: Diagnosis not present

## 2020-03-10 DIAGNOSIS — Z794 Long term (current) use of insulin: Secondary | ICD-10-CM | POA: Diagnosis not present

## 2020-03-10 DIAGNOSIS — E119 Type 2 diabetes mellitus without complications: Secondary | ICD-10-CM | POA: Diagnosis not present

## 2020-03-10 DIAGNOSIS — M25561 Pain in right knee: Secondary | ICD-10-CM | POA: Diagnosis not present

## 2020-03-10 DIAGNOSIS — Z0000 Encounter for general adult medical examination without abnormal findings: Secondary | ICD-10-CM | POA: Diagnosis not present

## 2020-03-10 DIAGNOSIS — E785 Hyperlipidemia, unspecified: Secondary | ICD-10-CM | POA: Diagnosis not present

## 2020-03-10 DIAGNOSIS — Z1331 Encounter for screening for depression: Secondary | ICD-10-CM | POA: Diagnosis not present

## 2020-03-10 DIAGNOSIS — I25118 Atherosclerotic heart disease of native coronary artery with other forms of angina pectoris: Secondary | ICD-10-CM | POA: Diagnosis not present

## 2020-03-15 ENCOUNTER — Other Ambulatory Visit
Admission: RE | Admit: 2020-03-15 | Discharge: 2020-03-15 | Disposition: A | Discharge status: Home / Self Care | Payer: Medicare HMO | Source: Ambulatory Visit | Attending: General Surgery | Admitting: General Surgery

## 2020-03-15 ENCOUNTER — Other Ambulatory Visit: Payer: Self-pay

## 2020-03-15 DIAGNOSIS — M79642 Pain in left hand: Secondary | ICD-10-CM | POA: Diagnosis not present

## 2020-03-15 DIAGNOSIS — M25562 Pain in left knee: Secondary | ICD-10-CM | POA: Diagnosis not present

## 2020-03-15 DIAGNOSIS — M79671 Pain in right foot: Secondary | ICD-10-CM | POA: Diagnosis not present

## 2020-03-15 DIAGNOSIS — Z20822 Contact with and (suspected) exposure to covid-19: Secondary | ICD-10-CM | POA: Diagnosis not present

## 2020-03-15 DIAGNOSIS — Z01812 Encounter for preprocedural laboratory examination: Secondary | ICD-10-CM | POA: Insufficient documentation

## 2020-03-15 DIAGNOSIS — M79641 Pain in right hand: Secondary | ICD-10-CM | POA: Diagnosis not present

## 2020-03-15 DIAGNOSIS — M79672 Pain in left foot: Secondary | ICD-10-CM | POA: Diagnosis not present

## 2020-03-15 DIAGNOSIS — G479 Sleep disorder, unspecified: Secondary | ICD-10-CM | POA: Diagnosis not present

## 2020-03-15 DIAGNOSIS — G8929 Other chronic pain: Secondary | ICD-10-CM | POA: Diagnosis not present

## 2020-03-15 DIAGNOSIS — M25561 Pain in right knee: Secondary | ICD-10-CM | POA: Diagnosis not present

## 2020-03-15 LAB — SARS CORONAVIRUS 2 (TAT 6-24 HRS): SARS Coronavirus 2: NEGATIVE

## 2020-03-16 ENCOUNTER — Encounter: Payer: Self-pay | Admitting: General Surgery

## 2020-03-17 ENCOUNTER — Encounter
Admission: RE | Disposition: A | Discharge status: Home / Self Care | Payer: Self-pay | Source: Home / Self Care | Attending: General Surgery

## 2020-03-17 ENCOUNTER — Ambulatory Visit: Payer: Medicare HMO | Admitting: Anesthesiology

## 2020-03-17 ENCOUNTER — Encounter: Payer: Self-pay | Admitting: General Surgery

## 2020-03-17 ENCOUNTER — Ambulatory Visit
Admission: RE | Admit: 2020-03-17 | Discharge: 2020-03-17 | Disposition: A | Discharge status: Home / Self Care | Payer: Medicare HMO | Attending: General Surgery | Admitting: General Surgery

## 2020-03-17 DIAGNOSIS — Z87891 Personal history of nicotine dependence: Secondary | ICD-10-CM | POA: Diagnosis not present

## 2020-03-17 DIAGNOSIS — Z7982 Long term (current) use of aspirin: Secondary | ICD-10-CM | POA: Diagnosis not present

## 2020-03-17 DIAGNOSIS — Z794 Long term (current) use of insulin: Secondary | ICD-10-CM | POA: Diagnosis not present

## 2020-03-17 DIAGNOSIS — E1140 Type 2 diabetes mellitus with diabetic neuropathy, unspecified: Secondary | ICD-10-CM | POA: Diagnosis not present

## 2020-03-17 DIAGNOSIS — K5790 Diverticulosis of intestine, part unspecified, without perforation or abscess without bleeding: Secondary | ICD-10-CM | POA: Diagnosis not present

## 2020-03-17 DIAGNOSIS — Z7984 Long term (current) use of oral hypoglycemic drugs: Secondary | ICD-10-CM | POA: Diagnosis not present

## 2020-03-17 DIAGNOSIS — Z86010 Personal history of colonic polyps: Secondary | ICD-10-CM | POA: Diagnosis not present

## 2020-03-17 DIAGNOSIS — Z1211 Encounter for screening for malignant neoplasm of colon: Secondary | ICD-10-CM | POA: Diagnosis not present

## 2020-03-17 DIAGNOSIS — D120 Benign neoplasm of cecum: Secondary | ICD-10-CM | POA: Insufficient documentation

## 2020-03-17 DIAGNOSIS — Z79899 Other long term (current) drug therapy: Secondary | ICD-10-CM | POA: Diagnosis not present

## 2020-03-17 DIAGNOSIS — K5730 Diverticulosis of large intestine without perforation or abscess without bleeding: Secondary | ICD-10-CM | POA: Diagnosis not present

## 2020-03-17 DIAGNOSIS — I255 Ischemic cardiomyopathy: Secondary | ICD-10-CM | POA: Diagnosis not present

## 2020-03-17 DIAGNOSIS — E785 Hyperlipidemia, unspecified: Secondary | ICD-10-CM | POA: Diagnosis not present

## 2020-03-17 DIAGNOSIS — Z7951 Long term (current) use of inhaled steroids: Secondary | ICD-10-CM | POA: Diagnosis not present

## 2020-03-17 HISTORY — DX: Chronic obstructive pulmonary disease, unspecified: J44.9

## 2020-03-17 HISTORY — PX: COLONOSCOPY WITH PROPOFOL: SHX5780

## 2020-03-17 HISTORY — DX: Angina pectoris, unspecified: I20.9

## 2020-03-17 LAB — GLUCOSE, CAPILLARY: Glucose-Capillary: 114 mg/dL — ABNORMAL HIGH (ref 70–99)

## 2020-03-17 SURGERY — COLONOSCOPY WITH PROPOFOL
Anesthesia: General

## 2020-03-17 MED ORDER — PROPOFOL 10 MG/ML IV BOLUS
INTRAVENOUS | Status: AC
  Filled 2020-03-17: qty 20

## 2020-03-17 MED ORDER — PROPOFOL 500 MG/50ML IV EMUL
INTRAVENOUS | Status: DC | PRN
  Administered 2020-03-17: 140 ug/kg/min via INTRAVENOUS

## 2020-03-17 MED ORDER — PROPOFOL 500 MG/50ML IV EMUL
INTRAVENOUS | Status: AC
  Filled 2020-03-17: qty 50

## 2020-03-17 MED ORDER — PROPOFOL 10 MG/ML IV BOLUS
INTRAVENOUS | Status: DC | PRN
  Administered 2020-03-17: 80 mg via INTRAVENOUS

## 2020-03-17 MED ORDER — PHENYLEPHRINE HCL (PRESSORS) 10 MG/ML IV SOLN
INTRAVENOUS | Status: DC | PRN
  Administered 2020-03-17 (×2): 100 ug via INTRAVENOUS

## 2020-03-17 MED ORDER — SODIUM CHLORIDE 0.9 % IV SOLN: INTRAVENOUS | Status: DC

## 2020-03-17 NOTE — Anesthesia Preprocedure Evaluation (Signed)
Anesthesia Evaluation  Patient identified by MRN, date of birth, ID band Patient awake    Reviewed: Allergy & Precautions, H&P , NPO status , Patient's Chart, lab work & pertinent test results  History of Anesthesia Complications Negative for: history of anesthetic complications  Airway Mallampati: III  TM Distance: <3 FB Neck ROM: limited    Dental  (+) Missing, Chipped, Poor Dentition   Pulmonary neg sleep apnea, pneumonia, neg COPD, Not current smoker, former smoker,    Pulmonary exam normal        Cardiovascular Exercise Tolerance: Good hypertension, (-) angina+ CAD, + Past MI and + Cardiac Stents  (-) CHF Normal cardiovascular exam(-) dysrhythmias (-) Valvular Problems/Murmurs     Neuro/Psych neg Seizures negative neurological ROS  negative psych ROS   GI/Hepatic negative GI ROS, Neg liver ROS, neg GERD  ,  Endo/Other  negative endocrine ROSdiabetes, Type 2, Oral Hypoglycemic Agents  Renal/GU Renal InsufficiencyRenal diseasenegative Renal ROS  negative genitourinary   Musculoskeletal  (+) Arthritis ,   Abdominal   Peds  Hematology negative hematology ROS (+)   Anesthesia Other Findings Past Medical History: No date: Anginal pain (HCC) No date: Arthritis No date: CAD (coronary artery disease)     Comment:  a. anterior stemi 08/10/2017; b. LHC 08/10/17: pLAD 70,               p-mLAD 99 thrombotic, ostD1 70, p-mLCx 30, pRCA-1 50               calcified, pRCA-2 99 calcified, dRCA 30. Successful               PCI/DES to p-mLAD x 2 No date: Cancer ([place])     Comment:  skin No date: CKD (chronic kidney disease), stage II No date: COPD (chronic obstructive pulmonary disease) (HCC) No date: Glaucoma No date: History of kidney stones No date: Hyperlipidemia No date: Hypertension No date: Insulin dependent diabetes mellitus No date: Ischemic cardiomyopathy     Comment:  a. TTE 08/10/17: technically difficult study, no                diagnostic RWMA, EF 40-50%, poor windows, very difficult               study  No date: Myocardial infarction [facility]) No date: Neuropathy 08/14/2017: S/P angioplasty with stent 08/10/17 emergently to LAD ,  5/8/ 19 atherectomy and stent to pRCA    Reproductive/Obstetrics negative OB ROS                             Anesthesia Physical  Anesthesia Plan  ASA: III  Anesthesia Plan: General   Post-op Pain Management:    Induction: Intravenous  PONV Risk Score and Plan: 2 and TIVA and Propofol infusion  Airway Management Planned: Nasal Cannula  Additional Equipment:   Intra-op Plan:   Post-operative Plan:   Informed Consent: I have reviewed the patients History and Physical, chart, labs and discussed the procedure including the risks, benefits and alternatives for the proposed anesthesia with the patient or authorized representative who has indicated his/her understanding and acceptance.     Dental Advisory Given  Plan Discussed with: Anesthesiologist, CRNA and Surgeon  Anesthesia Plan Comments: (Patient consented for risks of anesthesia including but not limited to:  - adverse reactions to medications - risk of airway placement if required - damage to eyes, teeth, lips or other oral mucosa - nerve damage due  to positioning  - sore throat or hoarseness - Damage to heart, brain, nerves, lungs, other parts of body or loss of life  Patient voiced understanding.)        Anesthesia Quick Evaluation

## 2020-03-17 NOTE — H&P (Addendum)
TAREK CRAVENS [id number] Sep 14, 1945     HPI:  Patient with a sessile serrated adenoma with dysplasia removed in January of this year. Piecemeal resection of the base. For follow up exam.   The patient used the same prep, but reports he doesn't think he got the same results.    Medications Prior to Admission  Medication Sig Dispense Refill Last Dose  . aspirin EC 81 MG EC tablet Take 1 tablet (81 mg total) by mouth daily.   03/16/2020 at Unknown time  . furosemide (LASIX) 20 MG tablet Take 20 mg by mouth daily.   Past Week at Unknown time  . gabapentin (NEURONTIN) 600 MG tablet Take 600 mg by mouth 4 (four) times daily.    03/16/2020 at Unknown time  . insulin detemir (LEVEMIR) 100 UNIT/ML injection Inject 0.5 mLs (50 Units total) into the skin 2 (two) times daily. (Patient taking differently: Inject 93 Units into the skin 2 (two) times daily.) 10 mL 11 03/16/2020 at Unknown time  . irbesartan (AVAPRO) 150 MG tablet Take 150 mg by mouth daily.   03/16/2020 at Unknown time  . metoprolol tartrate (LOPRESSOR) 25 MG tablet Take 1 tablet (25 mg total) by mouth 2 (two) times daily. 60 tablet 2 03/16/2020 at Unknown time  . sitaGLIPtin-metformin (JANUMET) 50-1000 MG tablet Take 1 tablet by mouth 2 (two) times daily with a meal.   Past Week at Unknown time  . acetaminophen (TYLENOL) 325 MG tablet Take 2 tablets (650 mg total) by mouth every 6 (six) hours as needed for mild pain (or Fever >/= 101).     Marland Kitchen albuterol (PROVENTIL HFA;VENTOLIN HFA) 108 (90 Base) MCG/ACT inhaler Inhale 2 puffs into the lungs every 6 (six) hours as needed for wheezing or shortness of breath. 1 Inhaler 0   . atorvastatin (LIPITOR) 80 MG tablet Take 1 tablet (80 mg total) by mouth daily at 6 PM. 30 tablet 2   . cetirizine (ZYRTEC) 10 MG tablet Take 10 mg by mouth daily.     . fluticasone (FLONASE) 50 MCG/ACT nasal spray Place 1 spray into both nostrils daily.     . Fluticasone-Salmeterol (ADVAIR) 250-50 MCG/DOSE AEPB Inhale 1 puff  into the lungs 2 (two) times daily.     . montelukast (SINGULAIR) 10 MG tablet Take 10 mg by mouth at bedtime.      . Multiple Vitamin (MULTIVITAMIN WITH MINERALS) TABS tablet Take 1 tablet by mouth daily.     . nitroGLYCERIN (NITROSTAT) 0.4 MG SL tablet PLACE 1 TABLET UNDER TONGUE EVERY 5 MIN AS NEEDED FOR CHEST PAIN IF NO RELIEF IN15 MIN CALL 911 (MAX 3 TABS) (Patient taking differently: Place 0.4 mg under the tongue every 5 (five) minutes as needed for chest pain. IF NO RELIEF IN15 MIN CALL 911 (MAX 3 TABS)) 25 tablet 11    No Known Allergies Past Medical History:  Diagnosis Date  . Anginal pain ([place])   . Arthritis   . CAD (coronary artery disease)    a. anterior stemi 08/10/2017; b. LHC 08/10/17: pLAD 70, p-mLAD 99 thrombotic, ostD1 70, p-mLCx 30, pRCA-1 50 calcified, pRCA-2 99 calcified, dRCA 30. Successful PCI/DES to p-mLAD x 2  . Cancer ([place])    skin  . CKD (chronic kidney disease), stage II   . COPD (chronic obstructive pulmonary disease) ([place])   . Glaucoma   . History of kidney stones   . Hyperlipidemia   . Hypertension   . Insulin dependent diabetes mellitus   .  Ischemic cardiomyopathy    a. TTE 08/10/17: technically difficult study, no diagnostic RWMA, EF 40-50%, poor windows, very difficult study   . Myocardial infarction ([place])   . Neuropathy   . Presence of permanent cardiac pacemaker   . S/P angioplasty with stent 08/10/17 emergently to LAD ,  08/13/17 atherectomy and stent to pRCA  08/14/2017   Past Surgical History:  Procedure Laterality Date  . CARDIAC CATHETERIZATION    . COLONOSCOPY WITH PROPOFOL N/A 12/08/2014   Procedure: COLONOSCOPY WITH PROPOFOL;  Surgeon: Manya Silvas, MD;  Location[facility] ENDOSCOPY;  Service: Endoscopy;  Laterality: N/A;  . COLONOSCOPY WITH PROPOFOL N/A 01/10/2016   Procedure: COLONOSCOPY WITH PROPOFOL;  Surgeon: Manya Silvas, MD;  Location: [facility] ENDOSCOPY;  Service: Endoscopy;  Laterality: N/A;  . COLONOSCOPY WITH PROPOFOL N/A 04/28/2019    Procedure: COLONOSCOPY WITH PROPOFOL;  Surgeon: Robert Bellow, MD;  Location: [facility] ENDOSCOPY;  Service: Endoscopy;  Laterality: N/A;  . CORONARY ATHERECTOMY N/A 08/13/2017   Procedure: CORONARY ATHERECTOMY;  Surgeon: Wellington Hampshire, MD;  Location: [place] CV LAB;  Service: Cardiovascular;  Laterality: N/A;  . CORONARY STENT INTERVENTION N/A 08/13/2017   Procedure: CORONARY STENT INTERVENTION;  Surgeon: Wellington Hampshire, MD;  Location: [place] CV LAB;  Service: Cardiovascular;  Laterality: N/A;  . CORONARY/GRAFT ACUTE MI REVASCULARIZATION N/A 08/10/2017   Procedure: Coronary/Graft Acute MI Revascularization;  Surgeon: Burnell Blanks, MD;  Location: [place] CV LAB;  Service: Cardiovascular;  Laterality: N/A;  . EXTRACORPOREAL SHOCK WAVE LITHOTRIPSY Right 11/09/2015   Procedure: EXTRACORPOREAL SHOCK WAVE LITHOTRIPSY (ESWL);  Surgeon: Royston Cowper, MD;  Location: [facility] ORS;  Service: Urology;  Laterality: Right;  . EXTRACORPOREAL SHOCK WAVE LITHOTRIPSY Left 10/17/2016   Procedure: EXTRACORPOREAL SHOCK WAVE LITHOTRIPSY (ESWL);  Surgeon: Royston Cowper, MD;  Location: [facility] ORS;  Service: Urology;  Laterality: Left;  . LEFT HEART CATH AND CORONARY ANGIOGRAPHY N/A 08/10/2017   Procedure: LEFT HEART CATH AND CORONARY ANGIOGRAPHY;  Surgeon: Burnell Blanks, MD;  Location: [place] CV LAB;  Service: Cardiovascular;  Laterality: N/A;  . LITHOTRIPSY     x 6  . PAROTIDECTOMY  2015  . TEMPORARY PACEMAKER N/A 08/13/2017   Procedure: TEMPORARY PACEMAKER;  Surgeon: Wellington Hampshire, MD;  Location: [place] CV LAB;  Service: Cardiovascular;  Laterality: N/A;  . TONSILLECTOMY     Social History   Socioeconomic History  . Marital status: Single    Spouse name: Not on file  . Number of children: Not on file  . Years of education: 76   . Highest education level: Bachelor's degree (e.g., BA, AB, BS)  Occupational History  . Occupation: retired   Tobacco Use  . Smoking  status: Former Smoker    Packs/day: 1.00    Years: 50.00    Pack years: 50.00    Types: Cigarettes    Quit date: 08/2017    Years since quitting: 2.6  . Smokeless tobacco: Never Used  . Tobacco comment: Has Quit as of May 2019- reviewed relaspe and will continue to followup  Vaping Use  . Vaping Use: Never used  Substance and Sexual Activity  . Alcohol use: No  . Drug use: No  . Sexual activity: Not on file  Other Topics Concern  . Not on file  Social History Narrative  . Not on file   Social Determinants of Health   Financial Resource Strain: Not on file  Food Insecurity: Not on file  Transportation Needs: Not  on file  Physical Activity: Not on file  Stress: Not on file  Social Connections: Not on file  Intimate Partner Violence: Not on file   Social History   Social History Narrative  . Not on file     ROS: Negative.   January 20,2021 endoscopy:  DIAGNOSIS:  A. COLON POLYPS X2, ASCENDING; HOT SNARE:  - SESSILE SERRATED POLYP WITH DYSPLASIA (MULTIPLE FRAGMENTS).  - TUBULAR ADENOMA (FRAGMENTS); NEGATIVE FOR HIGH-GRADE DYSPLASIA.  - NEGATIVE FOR MALIGNANCY.  - DEEPER SECTIONS EXAMINED.   B. COLON POLYP, TRANSVERSE; HOT SNARE:  - TUBULAR ADENOMA.  - NEGATIVE FOR HIGH-GRADE DYSPLASIA AND MALIGNANCY.   C. COLON POLYPS X2, TRANSVERSE; COLD BIOPSY:  - TUBULAR ADENOMA (2).  - NEGATIVE FOR HIGH-GRADE DYSPLASIA AND MALIGNANCY.   PE: HEENT: Negative. Lungs: Clear. Cardio: RR.   Assessment/Plan:  Proceed with planned endoscopy.  Patient aware that if visualization is not adequate, we will have to repeat the study.    Forest Gleason Vanesha [place] 03/17/2020

## 2020-03-17 NOTE — Op Note (Signed)
[facility] Patient Name: Henry [name] Procedure Date: 03/17/2020 8:34 AM MRN: [id number] Account #: [id number] Date of Birth: [birth year] Admit Type: Outpatient Age: 74 Room: [facility] ENDO ROOM 1 Gender: Male Note Status: Finalized Procedure:             Colonoscopy Indications:           High risk colon cancer surveillance: Personal history                         of colonic polyps Providers:             Robert Bellow, MD Medicines:             Monitored Anesthesia Care Complications:         No immediate complications. Procedure:             Pre-Anesthesia Assessment:                        - Prior to the procedure, a History and Physical was                         performed, and patient medications, allergies and                         sensitivities were reviewed. The patient's tolerance                         of previous anesthesia was reviewed.                        - Prior to the procedure, a History and Physical was                         performed, and patient medications, allergies and                         sensitivities were reviewed. The patient's tolerance                         of previous anesthesia was reviewed.                        - The risks and benefits of the procedure and the                         sedation options and risks were discussed with the                         patient. All questions were answered and informed                         consent was obtained.                        After obtaining informed consent, the colonoscope was                         passed under direct vision. Throughout the procedure,  the patient's blood pressure, pulse, and oxygen                         saturations were monitored continuously. The was                         introduced through the anus and advanced to the the                         cecum, identified by appendiceal orifice and  ileocecal                         valve. The colonoscopy was performed without                         difficulty. The patient tolerated the procedure well.                         The quality of the bowel preparation was good. Findings:      Multiple small and large-mouthed diverticula were found in the       recto-sigmoid colon and sigmoid colon.      A 8 mm polyp was found in the cecum. The polyp was sessile. The polyp       was removed with a hot snare. Resection and retrieval were complete.      The retroflexed view of the distal rectum and anal verge was normal and       showed no anal or rectal abnormalities. Impression:            - Diverticulosis in the recto-sigmoid colon and in the                         sigmoid colon.                        - One 8 mm polyp in the cecum, removed with a hot                         snare. Resected and retrieved.                        - The distal rectum and anal verge are normal on                         retroflexion view. Recommendation:        - Telephone endoscopist for pathology results in 1                         week. Procedure Code(s):     --- Professional ---                        [phone], Colonoscopy, flexible; with removal of                         tumor(s), polyp(s), or other lesion(s) by snare                         technique Diagnosis Code(s):     --- Professional ---  Z86.010, Personal history of colonic polyps                        K63.5, Polyp of colon                        K57.30, Diverticulosis of large intestine without                         perforation or abscess without bleeding CPT copyright 2019 [employer]. All rights reserved. The codes documented in this report are preliminary and upon coder review may  be revised to meet current compliance requirements. Robert Bellow, MD 03/17/2020 9:43:29 AM This report has been signed electronically. Number of Addenda:  0 Note Initiated On: 03/17/2020 8:34 AM Scope Withdrawal Time: 0 hours 22 minutes 52 seconds  Total Procedure Duration: 0 hours 32 minutes 35 seconds       [facility]

## 2020-03-17 NOTE — Transfer of Care (Signed)
Immediate Anesthesia Transfer of Care Note  Patient: Henry [name]  Procedure(s) Performed: COLONOSCOPY WITH PROPOFOL (N/A )  Patient Location: Endoscopy Unit  Anesthesia Type:Regional  Level of Consciousness: sedated  Airway & Oxygen Therapy: Patient Spontanous Breathing and Patient connected to nasal cannula oxygen  Post-op Assessment: Report given to RN and Post -op Vital signs reviewed and stable  Post vital signs: Reviewed and stable  Last Vitals:  Vitals Value Taken Time  BP 105/62 03/17/20 0944  Temp 36.2 C 03/17/20 0942  Pulse 86 03/17/20 0944  Resp 15 03/17/20 0944  SpO2 93 % 03/17/20 0944  Vitals shown include unvalidated device data.  Last Pain:  Vitals:   03/17/20 0942  TempSrc: Temporal  PainSc:          Complications: No complications documented.

## 2020-03-17 NOTE — Anesthesia Postprocedure Evaluation (Signed)
Anesthesia Post Note  Patient: Henry [name]  Procedure(s) Performed: COLONOSCOPY WITH PROPOFOL (N/A )  Patient location during evaluation: Endoscopy Anesthesia Type: General Level of consciousness: awake and alert Pain management: pain level controlled Vital Signs Assessment: post-procedure vital signs reviewed and stable Respiratory status: spontaneous breathing, nonlabored ventilation, respiratory function stable and patient connected to nasal cannula oxygen Cardiovascular status: blood pressure returned to baseline and stable Postop Assessment: no apparent nausea or vomiting Anesthetic complications: no   No complications documented.   Last Vitals:  Vitals:   03/17/20 0942 03/17/20 0944  BP:  105/62  Pulse:  73  Resp:    Temp: (!) 36.2 C   SpO2:  94%    Last Pain:  Vitals:   03/17/20 0942  TempSrc: Temporal  PainSc: Asleep                 Precious Haws Legrande Hao

## 2020-03-20 ENCOUNTER — Encounter: Payer: Self-pay | Admitting: General Surgery

## 2020-03-20 LAB — SURGICAL PATHOLOGY

## 2020-03-22 DIAGNOSIS — C44519 Basal cell carcinoma of skin of other part of trunk: Secondary | ICD-10-CM | POA: Diagnosis not present

## 2020-03-24 DIAGNOSIS — M47812 Spondylosis without myelopathy or radiculopathy, cervical region: Secondary | ICD-10-CM | POA: Diagnosis not present

## 2020-03-27 DIAGNOSIS — I5031 Acute diastolic (congestive) heart failure: Secondary | ICD-10-CM | POA: Diagnosis not present

## 2020-03-27 DIAGNOSIS — I2510 Atherosclerotic heart disease of native coronary artery without angina pectoris: Secondary | ICD-10-CM | POA: Diagnosis not present

## 2020-03-27 DIAGNOSIS — R0602 Shortness of breath: Secondary | ICD-10-CM | POA: Diagnosis not present

## 2020-04-03 DIAGNOSIS — E1129 Type 2 diabetes mellitus with other diabetic kidney complication: Secondary | ICD-10-CM | POA: Diagnosis not present

## 2020-04-03 DIAGNOSIS — R079 Chest pain, unspecified: Secondary | ICD-10-CM | POA: Diagnosis not present

## 2020-04-03 DIAGNOSIS — I25118 Atherosclerotic heart disease of native coronary artery with other forms of angina pectoris: Secondary | ICD-10-CM | POA: Diagnosis not present

## 2020-04-03 DIAGNOSIS — Z955 Presence of coronary angioplasty implant and graft: Secondary | ICD-10-CM | POA: Diagnosis not present

## 2020-04-03 DIAGNOSIS — I2109 ST elevation (STEMI) myocardial infarction involving other coronary artery of anterior wall: Secondary | ICD-10-CM | POA: Diagnosis not present

## 2020-04-03 DIAGNOSIS — R002 Palpitations: Secondary | ICD-10-CM | POA: Diagnosis not present

## 2020-04-03 DIAGNOSIS — I491 Atrial premature depolarization: Secondary | ICD-10-CM | POA: Diagnosis not present

## 2020-04-03 DIAGNOSIS — J449 Chronic obstructive pulmonary disease, unspecified: Secondary | ICD-10-CM | POA: Diagnosis not present

## 2020-04-03 DIAGNOSIS — I10 Essential (primary) hypertension: Secondary | ICD-10-CM | POA: Diagnosis not present

## 2020-04-10 DIAGNOSIS — H40003 Preglaucoma, unspecified, bilateral: Secondary | ICD-10-CM | POA: Diagnosis not present

## 2020-05-15 DIAGNOSIS — M47812 Spondylosis without myelopathy or radiculopathy, cervical region: Secondary | ICD-10-CM | POA: Diagnosis not present

## 2020-05-30 DIAGNOSIS — M47812 Spondylosis without myelopathy or radiculopathy, cervical region: Secondary | ICD-10-CM | POA: Diagnosis not present

## 2020-06-12 DIAGNOSIS — I1310 Hypertensive heart and chronic kidney disease without heart failure, with stage 1 through stage 4 chronic kidney disease, or unspecified chronic kidney disease: Secondary | ICD-10-CM | POA: Diagnosis not present

## 2020-06-12 DIAGNOSIS — Z125 Encounter for screening for malignant neoplasm of prostate: Secondary | ICD-10-CM | POA: Diagnosis not present

## 2020-06-12 DIAGNOSIS — N401 Enlarged prostate with lower urinary tract symptoms: Secondary | ICD-10-CM | POA: Diagnosis not present

## 2020-06-21 DIAGNOSIS — G4700 Insomnia, unspecified: Secondary | ICD-10-CM | POA: Diagnosis not present

## 2020-06-21 DIAGNOSIS — R059 Cough, unspecified: Secondary | ICD-10-CM | POA: Diagnosis not present

## 2020-07-03 DIAGNOSIS — Z87891 Personal history of nicotine dependence: Secondary | ICD-10-CM | POA: Diagnosis not present

## 2020-07-03 DIAGNOSIS — I25118 Atherosclerotic heart disease of native coronary artery with other forms of angina pectoris: Secondary | ICD-10-CM | POA: Diagnosis not present

## 2020-07-03 DIAGNOSIS — Z955 Presence of coronary angioplasty implant and graft: Secondary | ICD-10-CM | POA: Diagnosis not present

## 2020-07-03 DIAGNOSIS — I10 Essential (primary) hypertension: Secondary | ICD-10-CM | POA: Diagnosis not present

## 2020-07-03 DIAGNOSIS — E1129 Type 2 diabetes mellitus with other diabetic kidney complication: Secondary | ICD-10-CM | POA: Diagnosis not present

## 2020-07-03 DIAGNOSIS — J449 Chronic obstructive pulmonary disease, unspecified: Secondary | ICD-10-CM | POA: Diagnosis not present

## 2020-07-03 DIAGNOSIS — I491 Atrial premature depolarization: Secondary | ICD-10-CM | POA: Diagnosis not present

## 2020-07-03 DIAGNOSIS — I2109 ST elevation (STEMI) myocardial infarction involving other coronary artery of anterior wall: Secondary | ICD-10-CM | POA: Diagnosis not present

## 2020-07-03 DIAGNOSIS — R002 Palpitations: Secondary | ICD-10-CM | POA: Diagnosis not present

## 2020-07-05 DIAGNOSIS — M5030 Other cervical disc degeneration, unspecified cervical region: Secondary | ICD-10-CM | POA: Diagnosis not present

## 2020-07-05 DIAGNOSIS — M47812 Spondylosis without myelopathy or radiculopathy, cervical region: Secondary | ICD-10-CM | POA: Diagnosis not present

## 2020-07-11 DIAGNOSIS — J449 Chronic obstructive pulmonary disease, unspecified: Secondary | ICD-10-CM | POA: Diagnosis not present

## 2020-07-11 DIAGNOSIS — Z01818 Encounter for other preprocedural examination: Secondary | ICD-10-CM | POA: Diagnosis not present

## 2020-07-26 DIAGNOSIS — M47812 Spondylosis without myelopathy or radiculopathy, cervical region: Secondary | ICD-10-CM | POA: Diagnosis not present

## 2020-08-02 ENCOUNTER — Other Ambulatory Visit: Payer: Self-pay

## 2020-08-02 ENCOUNTER — Observation Stay: Payer: Medicare HMO

## 2020-08-02 ENCOUNTER — Emergency Department: Payer: Medicare HMO

## 2020-08-02 ENCOUNTER — Observation Stay
Admission: EM | Admit: 2020-08-02 | Discharge: 2020-08-03 | Disposition: A | Discharge status: Home / Self Care | Payer: Medicare HMO | Attending: Internal Medicine | Admitting: Internal Medicine

## 2020-08-02 ENCOUNTER — Observation Stay
Admit: 2020-08-02 | Discharge: 2020-08-02 | Disposition: A | Discharge status: Home / Self Care | Payer: Medicare HMO | Attending: Internal Medicine | Admitting: Internal Medicine

## 2020-08-02 DIAGNOSIS — R079 Chest pain, unspecified: Secondary | ICD-10-CM | POA: Diagnosis not present

## 2020-08-02 DIAGNOSIS — N182 Chronic kidney disease, stage 2 (mild): Secondary | ICD-10-CM | POA: Insufficient documentation

## 2020-08-02 DIAGNOSIS — Z7984 Long term (current) use of oral hypoglycemic drugs: Secondary | ICD-10-CM | POA: Diagnosis not present

## 2020-08-02 DIAGNOSIS — Z79899 Other long term (current) drug therapy: Secondary | ICD-10-CM | POA: Diagnosis not present

## 2020-08-02 DIAGNOSIS — I10 Essential (primary) hypertension: Secondary | ICD-10-CM | POA: Diagnosis present

## 2020-08-02 DIAGNOSIS — Z85828 Personal history of other malignant neoplasm of skin: Secondary | ICD-10-CM | POA: Diagnosis not present

## 2020-08-02 DIAGNOSIS — J9621 Acute and chronic respiratory failure with hypoxia: Secondary | ICD-10-CM | POA: Insufficient documentation

## 2020-08-02 DIAGNOSIS — Z950 Presence of cardiac pacemaker: Secondary | ICD-10-CM | POA: Insufficient documentation

## 2020-08-02 DIAGNOSIS — Z9861 Coronary angioplasty status: Secondary | ICD-10-CM | POA: Insufficient documentation

## 2020-08-02 DIAGNOSIS — Z87891 Personal history of nicotine dependence: Secondary | ICD-10-CM | POA: Diagnosis not present

## 2020-08-02 DIAGNOSIS — I25110 Atherosclerotic heart disease of native coronary artery with unstable angina pectoris: Principal | ICD-10-CM | POA: Insufficient documentation

## 2020-08-02 DIAGNOSIS — R0602 Shortness of breath: Secondary | ICD-10-CM | POA: Diagnosis not present

## 2020-08-02 DIAGNOSIS — Z794 Long term (current) use of insulin: Secondary | ICD-10-CM | POA: Diagnosis not present

## 2020-08-02 DIAGNOSIS — R1011 Right upper quadrant pain: Secondary | ICD-10-CM | POA: Diagnosis not present

## 2020-08-02 DIAGNOSIS — R0789 Other chest pain: Secondary | ICD-10-CM | POA: Diagnosis not present

## 2020-08-02 DIAGNOSIS — R52 Pain, unspecified: Secondary | ICD-10-CM

## 2020-08-02 DIAGNOSIS — E1169 Type 2 diabetes mellitus with other specified complication: Secondary | ICD-10-CM

## 2020-08-02 DIAGNOSIS — Z7982 Long term (current) use of aspirin: Secondary | ICD-10-CM | POA: Diagnosis not present

## 2020-08-02 DIAGNOSIS — E1122 Type 2 diabetes mellitus with diabetic chronic kidney disease: Secondary | ICD-10-CM | POA: Insufficient documentation

## 2020-08-02 DIAGNOSIS — J449 Chronic obstructive pulmonary disease, unspecified: Secondary | ICD-10-CM | POA: Diagnosis present

## 2020-08-02 DIAGNOSIS — I200 Unstable angina: Secondary | ICD-10-CM | POA: Diagnosis not present

## 2020-08-02 DIAGNOSIS — I129 Hypertensive chronic kidney disease with stage 1 through stage 4 chronic kidney disease, or unspecified chronic kidney disease: Secondary | ICD-10-CM | POA: Diagnosis not present

## 2020-08-02 DIAGNOSIS — Z20822 Contact with and (suspected) exposure to covid-19: Secondary | ICD-10-CM | POA: Insufficient documentation

## 2020-08-02 DIAGNOSIS — E119 Type 2 diabetes mellitus without complications: Secondary | ICD-10-CM

## 2020-08-02 LAB — COMPREHENSIVE METABOLIC PANEL
ALT: 21 U/L (ref 0–44)
AST: 26 U/L (ref 15–41)
Albumin: 4 g/dL (ref 3.5–5.0)
Alkaline Phosphatase: 65 U/L (ref 38–126)
Anion gap: 11 (ref 5–15)
BUN: 21 mg/dL (ref 8–23)
CO2: 28 mmol/L (ref 22–32)
Calcium: 9.9 mg/dL (ref 8.9–10.3)
Chloride: 98 mmol/L (ref 98–111)
Creatinine, Ser: 1.33 mg/dL — ABNORMAL HIGH (ref 0.61–1.24)
GFR, Estimated: 56 mL/min — ABNORMAL LOW (ref >60)
Glucose, Bld: 142 mg/dL — ABNORMAL HIGH (ref 70–99)
Potassium: 4.3 mmol/L (ref 3.5–5.1)
Sodium: 137 mmol/L (ref 135–145)
Total Bilirubin: 0.5 mg/dL (ref 0.3–1.2)
Total Protein: 7.2 g/dL (ref 6.5–8.1)

## 2020-08-02 LAB — CBC WITH DIFFERENTIAL/PLATELET
Abs Immature Granulocytes: 0.06 10*3/uL (ref 0.00–0.07)
Basophils Absolute: 0.1 10*3/uL (ref 0.0–0.1)
Basophils Relative: 1 %
Eosinophils Absolute: 0.5 10*3/uL (ref 0.0–0.5)
Eosinophils Relative: 5 %
HCT: 45.4 % (ref 39.0–52.0)
Hemoglobin: 14.3 g/dL (ref 13.0–17.0)
Immature Granulocytes: 1 %
Lymphocytes Relative: 21 %
Lymphs Abs: 2.1 10*3/uL (ref 0.7–4.0)
MCH: 25.9 pg — ABNORMAL LOW (ref 26.0–34.0)
MCHC: 31.5 g/dL (ref 30.0–36.0)
MCV: 82.1 fL (ref 80.0–100.0)
Monocytes Absolute: 1.3 10*3/uL — ABNORMAL HIGH (ref 0.1–1.0)
Monocytes Relative: 13 %
Neutro Abs: 5.7 10*3/uL (ref 1.7–7.7)
Neutrophils Relative %: 59 %
Platelets: 299 10*3/uL (ref 150–400)
RBC: 5.53 MIL/uL (ref 4.22–5.81)
RDW: 17.1 % — ABNORMAL HIGH (ref 11.5–15.5)
WBC: 9.8 10*3/uL (ref 4.0–10.5)
nRBC: 0 % (ref 0.0–0.2)

## 2020-08-02 LAB — RESP PANEL BY RT-PCR (FLU A&B, COVID) ARPGX2
Influenza A by PCR: NEGATIVE
Influenza B by PCR: NEGATIVE
SARS Coronavirus 2 by RT PCR: NEGATIVE

## 2020-08-02 LAB — NM MYOCAR MULTI W/SPECT W/WALL MOTION / EF
LV dias vol: 56 mL (ref 62–150)
LV sys vol: 18 mL
SDS: 0
SRS: 1
SSS: 0
TID: 0.81

## 2020-08-02 LAB — PROTIME-INR
INR: 0.9 (ref 0.8–1.2)
Prothrombin Time: 12.3 seconds (ref 11.4–15.2)

## 2020-08-02 LAB — LIPASE, BLOOD: Lipase: 54 U/L — ABNORMAL HIGH (ref 11–51)

## 2020-08-02 LAB — ECHOCARDIOGRAM COMPLETE
AR max vel: 4.56 cm2
AV Area VTI: 4.26 cm2
AV Area mean vel: 4.85 cm2
AV Mean grad: 3 mmHg
AV Peak grad: 7.1 mmHg
Ao pk vel: 1.33 m/s
Area-P 1/2: 2.53 cm2
Height: 72 in
MV VTI: 5.05 cm2
S' Lateral: 3 cm
Weight: 3680 oz

## 2020-08-02 LAB — TROPONIN I (HIGH SENSITIVITY)
Troponin I (High Sensitivity): 6 ng/L (ref <18)
Troponin I (High Sensitivity): 6 ng/L (ref <18)
Troponin I (High Sensitivity): 5 ng/L (ref <18)
Troponin I (High Sensitivity): 5 ng/L (ref <18)

## 2020-08-02 LAB — BRAIN NATRIURETIC PEPTIDE: B Natriuretic Peptide: 22.6 pg/mL (ref 0.0–100.0)

## 2020-08-02 LAB — HEMOGLOBIN A1C
Hgb A1c MFr Bld: 8.3 % — ABNORMAL HIGH (ref 4.8–5.6)
Mean Plasma Glucose: 191.51 mg/dL

## 2020-08-02 LAB — GLUCOSE, CAPILLARY
Glucose-Capillary: 137 mg/dL — ABNORMAL HIGH (ref 70–99)
Glucose-Capillary: 112 mg/dL — ABNORMAL HIGH (ref 70–99)

## 2020-08-02 LAB — APTT: aPTT: 32 seconds (ref 24–36)

## 2020-08-02 LAB — HEPARIN LEVEL (UNFRACTIONATED): Heparin Unfractionated: <0.1 IU/mL — ABNORMAL LOW (ref 0.30–0.70)

## 2020-08-02 MED ORDER — ACETAMINOPHEN 325 MG PO TABS: 650.0000 mg | ORAL_TABLET | Freq: Four times a day (QID) | ORAL | Status: DC | PRN

## 2020-08-02 MED ORDER — METOPROLOL TARTRATE 25 MG PO TABS
25.0000 mg | ORAL_TABLET | Freq: Two times a day (BID) | ORAL | Status: DC
  Administered 2020-08-02 – 2020-08-03 (×2): 25 mg via ORAL
  Filled 2020-08-02 (×2): qty 1

## 2020-08-02 MED ORDER — ADULT MULTIVITAMIN W/MINERALS CH
1.0000 | ORAL_TABLET | Freq: Every day | ORAL | Status: DC
  Administered 2020-08-03: 1 via ORAL
  Filled 2020-08-02: qty 1

## 2020-08-02 MED ORDER — ONDANSETRON HCL 4 MG/2ML IJ SOLN: 4.0000 mg | Freq: Four times a day (QID) | INTRAMUSCULAR | Status: DC | PRN

## 2020-08-02 MED ORDER — INSULIN ASPART 100 UNIT/ML ~~LOC~~ SOLN: 0.0000 [IU] | SUBCUTANEOUS | Status: DC

## 2020-08-02 MED ORDER — INSULIN DETEMIR 100 UNIT/ML ~~LOC~~ SOLN
50.0000 [IU] | Freq: Two times a day (BID) | SUBCUTANEOUS | Status: DC
  Filled 2020-08-02 (×2): qty 0.5

## 2020-08-02 MED ORDER — ATORVASTATIN CALCIUM 80 MG PO TABS
80.0000 mg | ORAL_TABLET | Freq: Every day | ORAL | Status: DC
  Administered 2020-08-03: 80 mg via ORAL
  Filled 2020-08-02: qty 1

## 2020-08-02 MED ORDER — ASPIRIN 81 MG PO CHEW
243.0000 mg | CHEWABLE_TABLET | Freq: Once | ORAL | Status: AC
  Administered 2020-08-02: 243 mg via ORAL
  Filled 2020-08-02: qty 3

## 2020-08-02 MED ORDER — ORAL CARE MOUTH RINSE
15.0000 mL | Freq: Two times a day (BID) | OROMUCOSAL | Status: DC
  Filled 2020-08-02: qty 15

## 2020-08-02 MED ORDER — ASPIRIN EC 81 MG PO TBEC
81.0000 mg | DELAYED_RELEASE_TABLET | Freq: Every day | ORAL | Status: DC
  Administered 2020-08-03: 81 mg via ORAL
  Filled 2020-08-02: qty 1

## 2020-08-02 MED ORDER — ALBUTEROL SULFATE HFA 108 (90 BASE) MCG/ACT IN AERS
2.0000 | INHALATION_SPRAY | Freq: Four times a day (QID) | RESPIRATORY_TRACT | Status: DC | PRN
  Filled 2020-08-02: qty 6.7

## 2020-08-02 MED ORDER — TAMSULOSIN HCL 0.4 MG PO CAPS
0.4000 mg | ORAL_CAPSULE | Freq: Every day | ORAL | Status: DC
  Administered 2020-08-03: 0.4 mg via ORAL
  Filled 2020-08-02: qty 1

## 2020-08-02 MED ORDER — LORATADINE 10 MG PO TABS
10.0000 mg | ORAL_TABLET | Freq: Every day | ORAL | Status: DC
  Administered 2020-08-03: 10 mg via ORAL
  Filled 2020-08-02: qty 1

## 2020-08-02 MED ORDER — NITROGLYCERIN 0.4 MG SL SUBL: 0.4000 mg | SUBLINGUAL_TABLET | SUBLINGUAL | Status: DC | PRN

## 2020-08-02 MED ORDER — TECHNETIUM TC 99M TETROFOSMIN IV KIT
30.0000 | PACK | Freq: Once | INTRAVENOUS | Status: AC | PRN
  Administered 2020-08-02: 28.42 via INTRAVENOUS

## 2020-08-02 MED ORDER — HEPARIN (PORCINE) 25000 UT/250ML-% IV SOLN
1700.0000 [IU]/h | INTRAVENOUS | Status: DC
  Administered 2020-08-02: 1400 [IU]/h via INTRAVENOUS
  Administered 2020-08-02: 1700 [IU]/h via INTRAVENOUS
  Filled 2020-08-02 (×2): qty 250

## 2020-08-02 MED ORDER — MONTELUKAST SODIUM 10 MG PO TABS
10.0000 mg | ORAL_TABLET | Freq: Every day | ORAL | Status: DC
  Administered 2020-08-02: 10 mg via ORAL
  Filled 2020-08-02: qty 1

## 2020-08-02 MED ORDER — FLUTICASONE PROPIONATE 50 MCG/ACT NA SUSP
1.0000 | Freq: Every day | NASAL | Status: DC
  Filled 2020-08-02: qty 16

## 2020-08-02 MED ORDER — ACETAMINOPHEN 325 MG PO TABS: 650.0000 mg | ORAL_TABLET | ORAL | Status: DC | PRN

## 2020-08-02 MED ORDER — INSULIN ASPART 100 UNIT/ML ~~LOC~~ SOLN
0.0000 [IU] | Freq: Three times a day (TID) | SUBCUTANEOUS | Status: DC
  Administered 2020-08-02: 2 [IU] via SUBCUTANEOUS
  Administered 2020-08-03: 3 [IU] via SUBCUTANEOUS
  Filled 2020-08-02 (×2): qty 1

## 2020-08-02 MED ORDER — HEPARIN BOLUS VIA INFUSION
4000.0000 [IU] | Freq: Once | INTRAVENOUS | Status: AC
  Administered 2020-08-02: 4000 [IU] via INTRAVENOUS
  Filled 2020-08-02: qty 4000

## 2020-08-02 MED ORDER — IRBESARTAN 150 MG PO TABS
150.0000 mg | ORAL_TABLET | Freq: Every day | ORAL | Status: DC
  Administered 2020-08-03: 150 mg via ORAL
  Filled 2020-08-02 (×2): qty 1

## 2020-08-02 MED ORDER — PERFLUTREN LIPID MICROSPHERE
1.0000 mL | INTRAVENOUS | Status: AC | PRN
Start: 2020-08-02 — End: 2020-08-02
  Administered 2020-08-02: 3 mL via INTRAVENOUS
  Filled 2020-08-02: qty 10

## 2020-08-02 MED ORDER — MORPHINE SULFATE (PF) 2 MG/ML IV SOLN: 2.0000 mg | INTRAVENOUS | Status: DC | PRN

## 2020-08-02 MED ORDER — HEPARIN BOLUS VIA INFUSION
3000.0000 [IU] | Freq: Once | INTRAVENOUS | Status: AC
  Administered 2020-08-02: 3000 [IU] via INTRAVENOUS
  Filled 2020-08-02: qty 3000

## 2020-08-02 MED ORDER — FUROSEMIDE 20 MG PO TABS
20.0000 mg | ORAL_TABLET | Freq: Every day | ORAL | Status: DC
  Administered 2020-08-03: 20 mg via ORAL
  Filled 2020-08-02: qty 1

## 2020-08-02 MED ORDER — GABAPENTIN 600 MG PO TABS
600.0000 mg | ORAL_TABLET | Freq: Four times a day (QID) | ORAL | Status: DC
  Administered 2020-08-02 – 2020-08-03 (×4): 600 mg via ORAL
  Filled 2020-08-02 (×4): qty 1

## 2020-08-02 MED ORDER — NITROGLYCERIN 0.4 MG SL SUBL
0.4000 mg | SUBLINGUAL_TABLET | SUBLINGUAL | Status: AC
  Administered 2020-08-02: 0.4 mg via SUBLINGUAL
  Filled 2020-08-02: qty 1

## 2020-08-02 MED ORDER — IPRATROPIUM-ALBUTEROL 20-100 MCG/ACT IN AERS
1.0000 | INHALATION_SPRAY | Freq: Four times a day (QID) | RESPIRATORY_TRACT | Status: DC
  Filled 2020-08-02: qty 4

## 2020-08-02 MED ORDER — REGADENOSON 0.4 MG/5ML IV SOLN
0.4000 mg | Freq: Once | INTRAVENOUS | Status: AC
  Administered 2020-08-02: 0.4 mg via INTRAVENOUS

## 2020-08-02 MED ORDER — TECHNETIUM TC 99M TETROFOSMIN IV KIT: 10.0000 | PACK | Freq: Once | INTRAVENOUS | Status: DC | PRN

## 2020-08-02 NOTE — Consult Note (Signed)
[facility] Consultation Note  Patient ID: Henry [name], MRN: [id number], DOB/AGE: 1946/03/15 75 y.o. Admit date: 08/02/2020   Date of Consult: 08/02/2020 Primary Physician: Juluis Pitch, MD Primary Cardiologist: Paraschos  Chief Complaint:  Chief Complaint  Patient presents with  . Chest Pain   Reason for Consult: Chest pain  HPI: 75 y.o. male with known coronary artery disease status post previous PCI and stent placement in multiple arteries in 2019 for which she has done fairly well in the last many years.  The patient has been out working in his yard doing other activities including walking.  He has not had any significant issues at all.  He has been on appropriate medication management including high intensity cholesterol therapy and hypertension therapy.  Recently he had an acute onset of substernal chest discomfort radiating into his back causing shortness of breath and weakness and fatigue.  This occurred in the middle the night and he was seen in the emergency room.  At that time he had an EKG showing normal sinus rhythm.  Additionally troponin level was 6.  He has since had full resolution of his symptoms and is no longer having difficulty.  He was placed on heparin and he is hemodynamically stable  Past Medical History:  Diagnosis Date  . Anginal pain ([place])   . Arthritis   . CAD (coronary artery disease)    a. anterior stemi 08/10/2017; b. LHC 08/10/17: pLAD 70, p-mLAD 99 thrombotic, ostD1 70, p-mLCx 30, pRCA-1 50 calcified, pRCA-2 99 calcified, dRCA 30. Successful PCI/DES to p-mLAD x 2  . Cancer ([place])    skin  . CKD (chronic kidney disease), stage II   . COPD (chronic obstructive pulmonary disease) ([place])   . Glaucoma   . History of kidney stones   . Hyperlipidemia   . Hypertension   . Insulin dependent diabetes mellitus   . Ischemic cardiomyopathy    a. TTE 08/10/17: technically difficult study, no diagnostic RWMA, EF 40-50%, poor windows, very difficult  study   . Myocardial infarction ([employer])   . Neuropathy   . S/P angioplasty with stent 08/10/17 emergently to LAD ,  08/13/17 atherectomy and stent to pRCA  08/14/2017      Surgical History:  Past Surgical History:  Procedure Laterality Date  . CARDIAC CATHETERIZATION    . COLONOSCOPY WITH PROPOFOL N/A 12/08/2014   Procedure: COLONOSCOPY WITH PROPOFOL;  Surgeon: Manya Silvas, MD;  Location: [facility] ENDOSCOPY;  Service: Endoscopy;  Laterality: N/A;  . COLONOSCOPY WITH PROPOFOL N/A 01/10/2016   Procedure: COLONOSCOPY WITH PROPOFOL;  Surgeon: Manya Silvas, MD;  Location: [facility] ENDOSCOPY;  Service: Endoscopy;  Laterality: N/A;  . COLONOSCOPY WITH PROPOFOL N/A 04/28/2019   Procedure: COLONOSCOPY WITH PROPOFOL;  Surgeon: Robert Bellow, MD;  Location: [facility] ENDOSCOPY;  Service: Endoscopy;  Laterality: N/A;  . COLONOSCOPY WITH PROPOFOL N/A 03/17/2020   Procedure: COLONOSCOPY WITH PROPOFOL;  Surgeon: Robert Bellow, MD;  Location: [facility] ENDOSCOPY;  Service: Endoscopy;  Laterality: N/A;  . CORONARY ATHERECTOMY N/A 08/13/2017   Procedure: CORONARY ATHERECTOMY;  Surgeon: Wellington Hampshire, MD;  Location: [place] CV LAB;  Service: Cardiovascular;  Laterality: N/A;  . CORONARY STENT INTERVENTION N/A 08/13/2017   Procedure: CORONARY STENT INTERVENTION;  Surgeon: Wellington Hampshire, MD;  Location: [place] CV LAB;  Service: Cardiovascular;  Laterality: N/A;  . CORONARY/GRAFT ACUTE MI REVASCULARIZATION N/A 08/10/2017   Procedure: Coronary/Graft Acute MI Revascularization;  Surgeon: Burnell Blanks, MD;  Location: [place]  CV LAB;  Service: Cardiovascular;  Laterality: N/A;  . EXTRACORPOREAL SHOCK WAVE LITHOTRIPSY Right 11/09/2015   Procedure: EXTRACORPOREAL SHOCK WAVE LITHOTRIPSY (ESWL);  Surgeon: Royston Cowper, MD;  Location: [facility] ORS;  Service: Urology;  Laterality: Right;  . EXTRACORPOREAL SHOCK WAVE LITHOTRIPSY Left 10/17/2016   Procedure: EXTRACORPOREAL SHOCK WAVE LITHOTRIPSY (ESWL);   Surgeon: Royston Cowper, MD;  Location: [facility] ORS;  Service: Urology;  Laterality: Left;  . LEFT HEART CATH AND CORONARY ANGIOGRAPHY N/A 08/10/2017   Procedure: LEFT HEART CATH AND CORONARY ANGIOGRAPHY;  Surgeon: Burnell Blanks, MD;  Location: [place] CV LAB;  Service: Cardiovascular;  Laterality: N/A;  . LITHOTRIPSY     x 6  . PAROTIDECTOMY  2015  . TEMPORARY PACEMAKER N/A 08/13/2017   Procedure: TEMPORARY PACEMAKER;  Surgeon: Wellington Hampshire, MD;  Location: [place] CV LAB;  Service: Cardiovascular;  Laterality: N/A;  . TONSILLECTOMY       Home Meds: Prior to Admission medications   Medication Sig Start Date End Date Taking? Authorizing Provider  aspirin EC 81 MG EC tablet Take 1 tablet (81 mg total) by mouth daily. 08/15/17  Yes Isaiah Serge, NP  atorvastatin (LIPITOR) 80 MG tablet Take 1 tablet (80 mg total) by mouth daily at 6 PM. Patient taking differently: Take 80 mg by mouth daily. 08/14/17  Yes Isaiah Serge, NP  cetirizine (ZYRTEC) 10 MG tablet Take 10 mg by mouth daily.   Yes [provider]  COMBIVENT RESPIMAT 20-100 MCG/ACT AERS respimat Inhale 1 puff into the lungs 4 (four) times daily. 07/22/20  Yes [provider]  fluticasone (FLONASE) 50 MCG/ACT nasal spray Place 1 spray into both nostrils daily.   Yes [provider]  furosemide (LASIX) 20 MG tablet Take 20 mg by mouth daily. 12/06/19  Yes [provider]  gabapentin (NEURONTIN) 600 MG tablet Take 600 mg by mouth 4 (four) times daily.    Yes [provider]  insulin detemir (LEVEMIR) 100 UNIT/ML injection Inject 0.5 mLs (50 Units total) into the skin 2 (two) times daily. Patient taking differently: Inject 93 Units into the skin 2 (two) times daily. 08/12/17  Yes Gladstone Lighter, MD  irbesartan (AVAPRO) 150 MG tablet Take 150 mg by mouth daily. 11/22/19  Yes [provider]  metoprolol tartrate (LOPRESSOR) 25 MG tablet Take 1 tablet (25 mg total) by  mouth 2 (two) times daily. 08/14/17  Yes Isaiah Serge, NP  montelukast (SINGULAIR) 10 MG tablet Take 10 mg by mouth at bedtime.  07/10/17  Yes [provider]  Multiple Vitamin (MULTIVITAMIN WITH MINERALS) TABS tablet Take 1 tablet by mouth daily.   Yes [provider]  nitroGLYCERIN (NITROSTAT) 0.4 MG SL tablet PLACE 1 TABLET UNDER TONGUE EVERY 5 MIN AS NEEDED FOR CHEST PAIN IF NO RELIEF IN15 MIN CALL 911 (MAX 3 TABS) Patient taking differently: Place 0.4 mg under the tongue every 5 (five) minutes as needed for chest pain. IF NO RELIEF IN15 MIN CALL 911 (MAX 3 TABS) 06/29/19  Yes Isaiah Serge, NP  sitaGLIPtin-metformin (JANUMET) 50-1000 MG tablet Take 1 tablet by mouth 2 (two) times daily with a meal.   Yes [provider]  tamsulosin (FLOMAX) 0.4 MG CAPS capsule Take 0.4 mg by mouth daily. 07/07/20  Yes [provider]  acetaminophen (TYLENOL) 325 MG tablet Take 2 tablets (650 mg total) by mouth every 6 (six) hours as needed for mild pain (or Fever >/= 101). Patient not taking: Reported  on 08/02/2020 01/27/16   Nicholes Mango, MD  albuterol (PROVENTIL HFA;VENTOLIN HFA) 108 (90 Base) MCG/ACT inhaler Inhale 2 puffs into the lungs every 6 (six) hours as needed for wheezing or shortness of breath. Patient not taking: Reported on 08/02/2020 01/27/16   Nicholes Mango, MD  Fluticasone-Salmeterol (ADVAIR) 250-50 MCG/DOSE AEPB Inhale 1 puff into the lungs 2 (two) times daily. Patient not taking: Reported on 08/02/2020    [provider]    Inpatient Medications:  . aspirin EC  81 mg Oral Daily  . atorvastatin  80 mg Oral Daily  . fluticasone  1 spray Each Nare Daily  . furosemide  20 mg Oral Daily  . gabapentin  600 mg Oral QID  . insulin aspart  0-15 Units Subcutaneous Q4H  . Ipratropium-Albuterol  1 puff Inhalation QID  . irbesartan  150 mg Oral Daily  . loratadine  10 mg Oral Daily  . mouth rinse  15 mL Mouth Rinse BID  . metoprolol tartrate  25 mg Oral BID   . montelukast  10 mg Oral QHS  . multivitamin with minerals  1 tablet Oral Daily  . tamsulosin  0.4 mg Oral Daily   . heparin 1,400 Units/hr (08/02/20 0630)    Allergies: No Known Allergies  Social History   Socioeconomic History  . Marital status: Single    Spouse name: Not on file  . Number of children: Not on file  . Years of education: 58   . Highest education level: Bachelor's degree (e.g., BA, AB, BS)  Occupational History  . Occupation: retired   Tobacco Use  . Smoking status: Former Smoker    Packs/day: 1.00    Years: 50.00    Pack years: 50.00    Types: Cigarettes    Quit date: 08/2017    Years since quitting: 2.9  . Smokeless tobacco: Never Used  . Tobacco comment: Has Quit as of May 2019- reviewed relaspe and will continue to followup  Vaping Use  . Vaping Use: Never used  Substance and Sexual Activity  . Alcohol use: No  . Drug use: No  . Sexual activity: Not on file  Other Topics Concern  . Not on file  Social History Narrative  . Not on file   Social Determinants of Health   Financial Resource Strain: Not on file  Food Insecurity: Not on file  Transportation Needs: Not on file  Physical Activity: Not on file  Stress: Not on file  Social Connections: Not on file  Intimate Partner Violence: Not on file     Family History  Problem Relation Age of Onset  . Heart attack Mother   . Melanoma Father   . Parkinsonism Father      Review of Systems Positive for chest pain Negative for: General:  chills, fever, night sweats or weight changes.  Cardiovascular: PND orthopnea syncope dizziness  Dermatological skin lesions rashes Respiratory: Cough congestion Urologic: Frequent urination urination at night and hematuria Abdominal: negative for nausea, vomiting, diarrhea, bright red blood per rectum, melena, or hematemesis Neurologic: negative for visual changes, and/or hearing changes  All other systems reviewed and are otherwise negative except as  noted above.  Labs: No results for input(s): CKTOTAL, CKMB, TROPONINI in the last 72 hours. Lab Results  Component Value Date   WBC 9.8 08/02/2020   HGB 14.3 08/02/2020   HCT 45.4 08/02/2020   MCV 82.1 08/02/2020   PLT 299 08/02/2020    Recent Labs  Lab 08/02/20 0459  NA  137  K 4.3  CL 98  CO2 28  BUN 21  CREATININE 1.33*  CALCIUM 9.9  PROT 7.2  BILITOT 0.5  ALKPHOS 65  ALT 21  AST 26  GLUCOSE 142*   Lab Results  Component Value Date   CHOL 132 08/10/2017   HDL 35 (L) 08/10/2017   LDLCALC 65 08/10/2017   TRIG 159 (H) 08/10/2017   No results found for: DDIMER  Radiology/Studies:  DG Chest Portable 1 View  Result Date: 08/02/2020 CLINICAL DATA:  75 year old male with acute chest pain and shortness of breath at 0300 hours. EXAM: PORTABLE CHEST 1 VIEW COMPARISON:  Chest CTA 12/24/2019 and earlier. FINDINGS: Portable AP upright view at 0525 hours. Mediastinal contours remain normal. Stable lung volumes. Chronic lower and middle lobe scarring as demonstrated by CTA last year appears stable. No superimposed pneumothorax, pulmonary edema, pleural effusion or new pulmonary opacity. No acute osseous abnormality identified. IMPRESSION: Chronic lower and middle lobe scarring. No acute cardiopulmonary abnormality. Electronically Signed   By: Genevie Ann M.D.   On: 08/02/2020 05:40    EKG: Normal sinus rhythm  Weights: Filed Weights   08/02/20 0501  Weight: 104.3 kg     Physical Exam: Blood pressure (!) 133/58, pulse 74, temperature 98.5 F (36.9 C), temperature source Oral, resp. rate 20, height 6' (1.829 m), weight 104.3 kg, SpO2 98 %. Body mass index is 31.19 kg/m. General: Well developed, well nourished, in no acute distress. Head eyes ears nose throat: Normocephalic, atraumatic, sclera non-icteric, no xanthomas, nares are without discharge. No apparent thyromegaly and/or mass  Lungs: Normal respiratory effort.  no wheezes, no rales, no rhonchi.  Heart: RRR with normal  S1 S2. no murmur gallop, no rub, PMI is normal size and placement, carotid upstroke normal without bruit, jugular venous pressure is normal Abdomen: Soft, non-tender, non-distended with normoactive bowel sounds. No hepatomegaly. No rebound/guarding. No obvious abdominal masses. Abdominal aorta is normal size without bruit Extremities: No edema. no cyanosis, no clubbing, no ulcers  Peripheral : 2+ bilateral upper extremity pulses, 2+ bilateral femoral pulses, 2+ bilateral dorsal pedal pulse Neuro: Alert and oriented. No facial asymmetry. No focal deficit. Moves all extremities spontaneously. Musculoskeletal: Normal muscle tone without kyphosis Psych:  Responds to questions appropriately with a normal affect.    Assessment: 75 year old male with hypertension hyperlipidemia coronary artery disease status post previous stent placement with atypical chest discomfort and no current evidence of congestive heart failure and/or myocardial infarction  Plan: 1.  Continue heparin at this time until further evaluation significant cardiovascular disease risk 2.  Stress test evaluation due to no evidence of myocardial infarction at this time or even new EKG changes 3.  Continue high intensity cholesterol therapy and high antihypertensives as before 4.  Further treatment options after above  Signed, Corey Skains M.D. [facility] 08/02/2020, 8:54 AM

## 2020-08-02 NOTE — Progress Notes (Signed)
ANTICOAGULATION CONSULT NOTE - Initial Consult  Pharmacy Consult for Heparin  Indication: chest pain/ACS  No Known Allergies  Patient Measurements: Height: 6' (182.9 cm) Weight: 104.3 kg (230 lb) IBW/kg (Calculated) : 77.6 Heparin Dosing Weight: 99.2 kg   Vital Signs: Temp: 98.6 F (37 C) (04/27 0457) Temp Source: Oral (04/27 0457) BP: 105/56 (04/27 0530) Pulse Rate: 74 (04/27 0530)  Labs: Recent Labs    08/02/20 0459  HGB 14.3  HCT 45.4  PLT 299  APTT 32  LABPROT 12.3  INR 0.9  CREATININE 1.33*  TROPONINIHS 6    Estimated Creatinine Clearance: 60.9 mL/min (A) (by C-G formula based on SCr of 1.33 mg/dL (H)).   Medical History: Past Medical History:  Diagnosis Date  . Anginal pain ([place])   . Arthritis   . CAD (coronary artery disease)    a. anterior stemi 08/10/2017; b. LHC 08/10/17: pLAD 70, p-mLAD 99 thrombotic, ostD1 70, p-mLCx 30, pRCA-1 50 calcified, pRCA-2 99 calcified, dRCA 30. Successful PCI/DES to p-mLAD x 2  . Cancer ([place])    skin  . CKD (chronic kidney disease), stage II   . COPD (chronic obstructive pulmonary disease) ([place])   . Glaucoma   . History of kidney stones   . Hyperlipidemia   . Hypertension   . Insulin dependent diabetes mellitus   . Ischemic cardiomyopathy    a. TTE 08/10/17: technically difficult study, no diagnostic RWMA, EF 40-50%, poor windows, very difficult study   . Myocardial infarction ([place])   . Neuropathy   . S/P angioplasty with stent 08/10/17 emergently to LAD ,  08/13/17 atherectomy and stent to pRCA  08/14/2017    Medications:  (Not in a hospital admission)   Assessment: Pharmacy consulted to dose heparin in this 75 year old male admitted with ACS/NSTEMI.   CrCl = 60.9 ml/min No prior anticoag noted.   Goal of Therapy:  Heparin level 0.3-0.7 units/ml Monitor platelets by anticoagulation protocol: Yes   Plan:  Give 4000 units bolus x 1 Start heparin infusion at 1400 units/hr Check anti-Xa level in 8 hours and daily while  on heparin Continue to monitor H&H and platelets  Fallou Hulbert D 08/02/2020,6:19 AM

## 2020-08-02 NOTE — ED Triage Notes (Signed)
Pt reports the sudden onset of chest pressure with associated shortness of breath that began around 0300 this morning. Frequent belching noted. Hx MI. Alert and oriented x4. 81mg  ASA PTA.

## 2020-08-02 NOTE — Progress Notes (Signed)
*  PRELIMINARY RESULTS* Echocardiogram 2D Echocardiogram has been performed.  Henry [name] Henry [name] Henry [name] Henry [name] 08/02/2020, 10:30 AM

## 2020-08-02 NOTE — H&P (Signed)
History and Physical    TAIVON HAROON HQI:[id number] DOB: [birth year] DOA: 08/02/2020  PCP: Juluis Pitch, MD   Patient coming from: Home  I have personally briefly reviewed patient's old medical records in [place]  Chief Complaint: Chest pain  HPI: Henry [name] is a 75 y.o. male with medical history significant for CAD status post stent angioplasty, HTN, COPD, who presents to the ER for evaluation of chest pain.  Patient states that he developed sudden onset chest pain that woke him up out of sleep at about 3 AM.  Pain was midsternal and was described as feeling like someone was sitting on his chest associated with nausea, palpitations and shortness of breath.  Patient states that he feels similar to the pain he had about 3 years ago when he was told he had a heart attack.  He took nitroglycerin at home without any improvement in his pain.  He also took all his morning medications without any significant improvement. He complains of abdominal discomfort mostly in the epigastrium and right upper quadrant but denies having any diaphoresis or emesis. He denies having any fever, no changes in his bowel habits.  No urinary frequency, no nocturia, no dysuria, no headache, no dizziness, no lightheadedness, no cough, no blurred vision, no headache, no leg swelling. Labs show sodium 137, potassium 4.3, chloride 98, bicarb 28, glucose 142, BUN 21, creatinine 1.3, calcium 9.9, alkaline phosphatase 65, albumin 4.0, lipase 54, AST 26, ALT 21, total protein 7.2, total bilirubin 0.5, BMP, troponin 6, white count 9.8, hemoglobin 14.8, hematocrit 24.4 MCV 82.1, RDW 17.1, platelet count 299, PT 12.3, INR 0.9 Respiratory viral panel is negative Chest x-ray reviewed by me shows chronic lower and middle lobe scarring. No acute cardiopulmonary abnormality. Twelve-lead EKG reviewed by me shows sinus rhythm with PACs and right axis deviation.     ER course Patient is a 76 year old Caucasian  male with a history of coronary artery disease status post stent angioplasty, diabetes mellitus, hypertension and COPD who presents to the ER via EMS for evaluation of chest pain that woke him up out of his sleep at about 3 AM.  Chest pain was mostly midsternal, nonradiating, rated 9 x 10 in intensity at its worst and associated with nausea, palpitations and shortness of breath. Patient has not had any improvement in his symptoms following administration of nitroglycerin.  He continues to have chest pain and is on a heparin drip for presumed unstable angina.  Troponin is within normal limits.   Patient will be referred to observation status for further evaluation.  Review of Systems: As per HPI otherwise all other systems reviewed and negative.    Past Medical History:  Diagnosis Date  . Anginal pain ([place])   . Arthritis   . CAD (coronary artery disease)    a. anterior stemi 08/10/2017; b. LHC 08/10/17: pLAD 70, p-mLAD 99 thrombotic, ostD1 70, p-mLCx 30, pRCA-1 50 calcified, pRCA-2 99 calcified, dRCA 30. Successful PCI/DES to p-mLAD x 2  . Cancer ([place])    skin  . CKD (chronic kidney disease), stage II   . COPD (chronic obstructive pulmonary disease) ([place])   . Glaucoma   . History of kidney stones   . Hyperlipidemia   . Hypertension   . Insulin dependent diabetes mellitus   . Ischemic cardiomyopathy    a. TTE 08/10/17: technically difficult study, no diagnostic RWMA, EF 40-50%, poor windows, very difficult study   . Myocardial infarction ([place])   . Neuropathy   .  S/P angioplasty with stent 08/10/17 emergently to LAD ,  08/13/17 atherectomy and stent to pRCA  08/14/2017    Past Surgical History:  Procedure Laterality Date  . CARDIAC CATHETERIZATION    . COLONOSCOPY WITH PROPOFOL N/A 12/08/2014   Procedure: COLONOSCOPY WITH PROPOFOL;  Surgeon: Manya Silvas, MD;  Location[facility] ENDOSCOPY;  Service: Endoscopy;  Laterality: N/A;  . COLONOSCOPY WITH PROPOFOL N/A 01/10/2016   Procedure: COLONOSCOPY  WITH PROPOFOL;  Surgeon: Manya Silvas, MD;  Location[facility] ENDOSCOPY;  Service: Endoscopy;  Laterality: N/A;  . COLONOSCOPY WITH PROPOFOL N/A 04/28/2019   Procedure: COLONOSCOPY WITH PROPOFOL;  Surgeon: Robert Bellow, MD;  Location: [facility] ENDOSCOPY;  Service: Endoscopy;  Laterality: N/A;  . COLONOSCOPY WITH PROPOFOL N/A 03/17/2020   Procedure: COLONOSCOPY WITH PROPOFOL;  Surgeon: Robert Bellow, MD;  Location: [facility] ENDOSCOPY;  Service: Endoscopy;  Laterality: N/A;  . CORONARY ATHERECTOMY N/A 08/13/2017   Procedure: CORONARY ATHERECTOMY;  Surgeon: Wellington Hampshire, MD;  Location: [place] CV LAB;  Service: Cardiovascular;  Laterality: N/A;  . CORONARY STENT INTERVENTION N/A 08/13/2017   Procedure: CORONARY STENT INTERVENTION;  Surgeon: Wellington Hampshire, MD;  Location: [place] CV LAB;  Service: Cardiovascular;  Laterality: N/A;  . CORONARY/GRAFT ACUTE MI REVASCULARIZATION N/A 08/10/2017   Procedure: Coronary/Graft Acute MI Revascularization;  Surgeon: Burnell Blanks, MD;  Location: [place] CV LAB;  Service: Cardiovascular;  Laterality: N/A;  . EXTRACORPOREAL SHOCK WAVE LITHOTRIPSY Right 11/09/2015   Procedure: EXTRACORPOREAL SHOCK WAVE LITHOTRIPSY (ESWL);  Surgeon: Royston Cowper, MD;  Location: [facility] ORS;  Service: Urology;  Laterality: Right;  . EXTRACORPOREAL SHOCK WAVE LITHOTRIPSY Left 10/17/2016   Procedure: EXTRACORPOREAL SHOCK WAVE LITHOTRIPSY (ESWL);  Surgeon: Royston Cowper, MD;  Location: [facility] ORS;  Service: Urology;  Laterality: Left;  . LEFT HEART CATH AND CORONARY ANGIOGRAPHY N/A 08/10/2017   Procedure: LEFT HEART CATH AND CORONARY ANGIOGRAPHY;  Surgeon: Burnell Blanks, MD;  Location: [place] CV LAB;  Service: Cardiovascular;  Laterality: N/A;  . LITHOTRIPSY     x 6  . PAROTIDECTOMY  2015  . TEMPORARY PACEMAKER N/A 08/13/2017   Procedure: TEMPORARY PACEMAKER;  Surgeon: Wellington Hampshire, MD;  Location: [place] CV LAB;  Service:  Cardiovascular;  Laterality: N/A;  . TONSILLECTOMY       reports that he quit smoking about 2 years ago. His smoking use included cigarettes. He has a 50.00 pack-year smoking history. He has never used smokeless tobacco. He reports that he does not drink alcohol and does not use drugs.  No Known Allergies  Family History  Problem Relation Age of Onset  . Heart attack Mother   . Melanoma Father   . Parkinsonism Father       Prior to Admission medications   Medication Sig Start Date End Date Taking? Authorizing Provider  aspirin EC 81 MG EC tablet Take 1 tablet (81 mg total) by mouth daily. 08/15/17  Yes Isaiah Serge, NP  atorvastatin (LIPITOR) 80 MG tablet Take 1 tablet (80 mg total) by mouth daily at 6 PM. Patient taking differently: Take 80 mg by mouth daily. 08/14/17  Yes Isaiah Serge, NP  cetirizine (ZYRTEC) 10 MG tablet Take 10 mg by mouth daily.   Yes [provider]  COMBIVENT RESPIMAT 20-100 MCG/ACT AERS respimat Inhale 1 puff into the lungs 4 (four) times daily. 07/22/20  Yes [provider]  fluticasone (FLONASE) 50 MCG/ACT nasal spray Place 1 spray into both nostrils daily.  Yes [provider]  furosemide (LASIX) 20 MG tablet Take 20 mg by mouth daily. 12/06/19  Yes [provider]  gabapentin (NEURONTIN) 600 MG tablet Take 600 mg by mouth 4 (four) times daily.    Yes [provider]  insulin detemir (LEVEMIR) 100 UNIT/ML injection Inject 0.5 mLs (50 Units total) into the skin 2 (two) times daily. Patient taking differently: Inject 93 Units into the skin 2 (two) times daily. 08/12/17  Yes Gladstone Lighter, MD  irbesartan (AVAPRO) 150 MG tablet Take 150 mg by mouth daily. 11/22/19  Yes [provider]  metoprolol tartrate (LOPRESSOR) 25 MG tablet Take 1 tablet (25 mg total) by mouth 2 (two) times daily. 08/14/17  Yes Isaiah Serge, NP  montelukast (SINGULAIR) 10 MG tablet Take 10 mg by mouth at bedtime.  07/10/17  Yes  [provider]  Multiple Vitamin (MULTIVITAMIN WITH MINERALS) TABS tablet Take 1 tablet by mouth daily.   Yes [provider]  nitroGLYCERIN (NITROSTAT) 0.4 MG SL tablet PLACE 1 TABLET UNDER TONGUE EVERY 5 MIN AS NEEDED FOR CHEST PAIN IF NO RELIEF IN15 MIN CALL 911 (MAX 3 TABS) Patient taking differently: Place 0.4 mg under the tongue every 5 (five) minutes as needed for chest pain. IF NO RELIEF IN15 MIN CALL 911 (MAX 3 TABS) 06/29/19  Yes Isaiah Serge, NP  sitaGLIPtin-metformin (JANUMET) 50-1000 MG tablet Take 1 tablet by mouth 2 (two) times daily with a meal.   Yes [provider]  tamsulosin (FLOMAX) 0.4 MG CAPS capsule Take 0.4 mg by mouth daily. 07/07/20  Yes [provider]  acetaminophen (TYLENOL) 325 MG tablet Take 2 tablets (650 mg total) by mouth every 6 (six) hours as needed for mild pain (or Fever >/= 101). Patient not taking: Reported on 08/02/2020 01/27/16   Nicholes Mango, MD  albuterol (PROVENTIL HFA;VENTOLIN HFA) 108 (90 Base) MCG/ACT inhaler Inhale 2 puffs into the lungs every 6 (six) hours as needed for wheezing or shortness of breath. Patient not taking: Reported on 08/02/2020 01/27/16   Nicholes Mango, MD  Fluticasone-Salmeterol (ADVAIR) 250-50 MCG/DOSE AEPB Inhale 1 puff into the lungs 2 (two) times daily. Patient not taking: Reported on 08/02/2020    [provider]    Physical Exam: Vitals:   08/02/20 0501 08/02/20 0530 08/02/20 0700 08/02/20 0730  BP:  (!) 105/56 114/61 110/66  Pulse:  74 70 65  Resp:  19 20 (!) 28  Temp:      TempSrc:      SpO2:  95% 95% 94%  Weight: 104.3 kg     Height: 6' (1.829 m)        Vitals:   08/02/20 0501 08/02/20 0530 08/02/20 0700 08/02/20 0730  BP:  (!) 105/56 114/61 110/66  Pulse:  74 70 65  Resp:  19 20 (!) 28  Temp:      TempSrc:      SpO2:  95% 95% 94%  Weight: 104.3 kg     Height: 6' (1.829 m)         Constitutional: Alert and oriented x 3. Not in any apparent  distress HEENT:      Head: Normocephalic and atraumatic.         Eyes: PERLA, EOMI, Conjunctivae are normal. Sclera is non-icteric.       Mouth/Throat: Mucous membranes are moist.       Neck: Supple with no signs of meningismus. Cardiovascular: Regular rate and rhythm. No murmurs, gallops, or rubs. 2+ symmetrical  distal pulses are present . No JVD. No LE edema Respiratory: Respiratory effort normal .Lungs sounds clear bilaterally. No wheezes, crackles, or rhonchi.  Gastrointestinal: Soft,  Tender in right upper quadrant and epigastrium, and non distended with positive bowel sounds.  Central adiposity Genitourinary: No CVA tenderness. Musculoskeletal: Nontender with normal range of motion in all extremities. No cyanosis, or erythema of extremities. Neurologic:  Face is symmetric. Moving all extremities. No gross focal neurologic deficits  Skin: Skin is warm, dry.  No rash or ulcers Psychiatric: Mood and affect are normal   Labs on Admission: I have personally reviewed following labs and imaging studies  CBC: Recent Labs  Lab 08/02/20 0459  WBC 9.8  NEUTROABS 5.7  HGB 14.3  HCT 45.4  MCV 82.1  PLT 527   Basic Metabolic Panel: Recent Labs  Lab 08/02/20 0459  NA 137  K 4.3  CL 98  CO2 28  GLUCOSE 142*  BUN 21  CREATININE 1.33*  CALCIUM 9.9   GFR: Estimated Creatinine Clearance: 60.9 mL/min (A) (by C-G formula based on SCr of 1.33 mg/dL (H)). Liver Function Tests: Recent Labs  Lab 08/02/20 0459  AST 26  ALT 21  ALKPHOS 65  BILITOT 0.5  PROT 7.2  ALBUMIN 4.0   Recent Labs  Lab 08/02/20 0459  LIPASE 54*   No results for input(s): AMMONIA in the last 168 hours. Coagulation Profile: Recent Labs  Lab 08/02/20 0459  INR 0.9   Cardiac Enzymes: No results for input(s): CKTOTAL, CKMB, CKMBINDEX, TROPONINI in the last 168 hours. BNP (last 3 results) No results for input(s): PROBNP in the last 8760 hours. HbA1C: No results for input(s): HGBA1C in the last 72  hours. CBG: No results for input(s): GLUCAP in the last 168 hours. Lipid Profile: No results for input(s): CHOL, HDL, LDLCALC, TRIG, CHOLHDL, LDLDIRECT in the last 72 hours. Thyroid Function Tests: No results for input(s): TSH, T4TOTAL, FREET4, T3FREE, THYROIDAB in the last 72 hours. Anemia Panel: No results for input(s): VITAMINB12, FOLATE, FERRITIN, TIBC, IRON, RETICCTPCT in the last 72 hours. Urine analysis:    Component Value Date/Time   COLORURINE YELLOW (A) 12/25/2019 0123   APPEARANCEUR CLEAR (A) 12/25/2019 0123   LABSPEC 1.031 (H) 12/25/2019 0123   PHURINE 6.0 12/25/2019 0123   GLUCOSEU >=500 (A) 12/25/2019 0123   HGBUR NEGATIVE 12/25/2019 0123   BILIRUBINUR NEGATIVE 12/25/2019 0123   KETONESUR 5 (A) 12/25/2019 0123   PROTEINUR NEGATIVE 12/25/2019 0123   NITRITE NEGATIVE 12/25/2019 0123   LEUKOCYTESUR NEGATIVE 12/25/2019 0123    Radiological Exams on Admission: DG Chest Portable 1 View  Result Date: 08/02/2020 CLINICAL DATA:  75 year old male with acute chest pain and shortness of breath at 0300 hours. EXAM: PORTABLE CHEST 1 VIEW COMPARISON:  Chest CTA 12/24/2019 and earlier. FINDINGS: Portable AP upright view at 0525 hours. Mediastinal contours remain normal. Stable lung volumes. Chronic lower and middle lobe scarring as demonstrated by CTA last year appears stable. No superimposed pneumothorax, pulmonary edema, pleural effusion or new pulmonary opacity. No acute osseous abnormality identified. IMPRESSION: Chronic lower and middle lobe scarring. No acute cardiopulmonary abnormality. Electronically Signed   By: Genevie Ann M.D.   On: 08/02/2020 05:40     Assessment/Plan Principal Problem:   Unstable angina (HCC) Active Problems:   Diabetes (HCC)   COPD (chronic obstructive pulmonary disease) (HCC)   Benign essential hypertension   Chest pain    Unstable angina In a patient with known coronary artery disease status post stent angioplasty. Other  risk factor includes  diabetes mellitus and hypertension. Patient continues to have chest pain despite multiple doses of nitroglycerin. Serial troponin is negative Continue heparin drip initiated in the ER Obtain 2D echocardiogram to rule out regional wall motion abnormality Consult cardiology Continue aspirin, metoprolol and high intensity statins. 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  All questions and concerns have been addressed.  He verbalizes understanding and agrees with the plan Disposition Plan: Back to previous home environment Consults called: Cardiology Status: Observation    Yomaris Palecek MD [facility]     08/02/2020, 8:26 AM

## 2020-08-02 NOTE — Progress Notes (Signed)
[facility] Cardiology [facility] Encounter Note  Patient: Henry [name] / Admit Date: 08/02/2020 / Date of Encounter: 08/02/2020, 5:18 PM   Subjective: Patient has had full resolution of chest discomfort and no further issues.  No evidence of elevation of troponin or EKG changes.  Stress test shows normal myocardial perfusion without evidence of myocardial ischemia.  Review of Systems: Positive for: None Negative for: Vision change, hearing change, syncope, dizziness, nausea, vomiting,diarrhea, bloody stool, stomach pain, cough, congestion, diaphoresis, urinary frequency, urinary pain,skin lesions, skin rashes Others previously listed  Objective: Telemetry: Normal sinus rhythm Physical Exam: Blood pressure 117/72, pulse 82, temperature 98.3 F (36.8 C), resp. rate 18, height 6' (1.829 m), weight 104.3 kg, SpO2 98 %. Body mass index is 31.19 kg/m. General: Well developed, well nourished, in no acute distress. Head: Normocephalic, atraumatic, sclera non-icteric, no xanthomas, nares are without discharge. Neck: No apparent masses Lungs: Normal respirations with no wheezes, no rhonchi, no rales , no crackles   Heart: Regular rate and rhythm, normal S1 S2, no murmur, no rub, no gallop, PMI is normal size and placement, carotid upstroke normal without bruit, jugular venous pressure normal Abdomen: Soft, non-tender, non-distended with normoactive bowel sounds. No hepatosplenomegaly. Abdominal aorta is normal size without bruit Extremities: No edema, no clubbing, no cyanosis, no ulcers,  Peripheral: 2+ radial, 2+ femoral, 2+ dorsal pedal pulses Neuro: Alert and oriented. Moves all extremities spontaneously. Psych:  Responds to questions appropriately with a normal affect.  No intake or output data in the 24 hours ending 08/02/20 1718  Inpatient Medications:  . aspirin EC  81 mg Oral Daily  . atorvastatin  80 mg Oral Daily  . fluticasone  1 spray Each Nare Daily  . furosemide  20 mg  Oral Daily  . gabapentin  600 mg Oral QID  . insulin aspart  0-15 Units Subcutaneous TID WC  . Ipratropium-Albuterol  1 puff Inhalation QID  . irbesartan  150 mg Oral Daily  . loratadine  10 mg Oral Daily  . mouth rinse  15 mL Mouth Rinse BID  . metoprolol tartrate  25 mg Oral BID  . montelukast  10 mg Oral QHS  . multivitamin with minerals  1 tablet Oral Daily  . tamsulosin  0.4 mg Oral Daily   Infusions:  . heparin 1,700 Units/hr (08/02/20 1632)    Labs: Recent Labs    08/02/20 0459  NA 137  K 4.3  CL 98  CO2 28  GLUCOSE 142*  BUN 21  CREATININE 1.33*  CALCIUM 9.9   Recent Labs    08/02/20 0459  AST 26  ALT 21  ALKPHOS 65  BILITOT 0.5  PROT 7.2  ALBUMIN 4.0   Recent Labs    08/02/20 0459  WBC 9.8  NEUTROABS 5.7  HGB 14.3  HCT 45.4  MCV 82.1  PLT 299   No results for input(s): CKTOTAL, CKMB, TROPONINI in the last 72 hours. Invalid input(s): POCBNP Recent Labs    08/02/20 0923  HGBA1C 8.3*     Weights: Filed Weights   08/02/20 0501 08/02/20 0827  Weight: 104.3 kg 104.3 kg     Radiology/Studies:  NM Myocar Multi W/Spect W/Wall Motion / EF  Result Date: 08/02/2020  The study is normal.  This is a low risk study.  The left ventricular ejection fraction is normal (55-65%).    DG Chest Portable 1 View  Result Date: 08/02/2020 CLINICAL DATA:  75 year old male with acute chest pain and shortness of breath at  0300 hours. EXAM: PORTABLE CHEST 1 VIEW COMPARISON:  Chest CTA 12/24/2019 and earlier. FINDINGS: Portable AP upright view at 0525 hours. Mediastinal contours remain normal. Stable lung volumes. Chronic lower and middle lobe scarring as demonstrated by CTA last year appears stable. No superimposed pneumothorax, pulmonary edema, pleural effusion or new pulmonary opacity. No acute osseous abnormality identified. IMPRESSION: Chronic lower and middle lobe scarring. No acute cardiopulmonary abnormality. Electronically Signed   By: Genevie Ann M.D.   On:  08/02/2020 05:40   US Abdomen Limited RUQ (LIVER/GB)  Result Date: 08/02/2020 CLINICAL DATA:  75 year old male with acute pain x6 hours. EXAM: ULTRASOUND ABDOMEN LIMITED RIGHT UPPER QUADRANT COMPARISON:  Portable chest x-ray earlier today. CTA chest 12/24/2019. FINDINGS: Gallbladder: No gallstones or wall thickening visualized. No sonographic Murphy sign noted by sonographer. Common bile duct: Diameter: 4 mm, normal. Liver: No focal lesion identified. Within normal limits in parenchymal echogenicity. Portal vein is patent on color Doppler imaging with normal direction of blood flow towards the liver. Other: Grossly negative visible right kidney. IMPRESSION: Negative right upper quadrant ultrasound. Electronically Signed   By: Genevie Ann M.D.   On: 08/02/2020 09:05     Assessment and Recommendation  75 y.o. male with known coronary artery disease status post multiple previous stenting in the past hypertension hyperlipidemia on appropriate medication management with atypical chest discomfort and no current evidence of congestive heart failure acute coronary syndrome and or myocardial infarction with a normal stress test and no evidence of myocardial ischemia 1.  Patient okay for discharge home from cardiac standpoint if ambulating well without any further significant symptoms with follow-up in 1 to 2 weeks 2.  Continue current medical regimen for hypertension hyperlipidemia and single antiplatelet therapy as before 3.  No further cardiac diagnostic necessary at this time  Signed, Serafina Royals M.D. FACC

## 2020-08-02 NOTE — ED Provider Notes (Addendum)
[facility] Emergency Department Provider Note  ____________________________________________   Event Date/Time   First MD Initiated Contact with Patient 08/02/20 0503     (approximate)  I have reviewed the triage vital signs and the nursing notes.   HISTORY  Chief Complaint Chest Pain    HPI Henry [name] is a 75 y.o. male with medical history including extensive coronary artery disease status post stent placement x 3 and prior MI in 2019 (almost exactly 3 years ago).    He presents by EMS for evaluation of acute onset and moderate to severe chest pressure as well as shortness of breath.  He said that it awoke him from sleep at about 3 AM and he was unable to get back to sleep.  He took a nitroglycerin at home which he has never used in the past but had "just in case" from Dr. Saralyn Pilar.  It did not seem to make a difference.  He taken 81 mg aspirin at home as well.  He said he started feeling a little bit better for a while but now he is feeling worse again.  Lying down flat makes him more short of breath and exertion makes both the chest pressure and the shortness of breath worse.  Nothing in particular seems to make it better.  He denies fever, sore throat, nausea, vomiting, abdominal pain, and dysuria.  He said that he has been following up with Dr. Saralyn Pilar, he has been compliant with his medications, he has no known history of congestive heart failure and does not take diuretics, and he does not typically have chest pain.        Past Medical History:  Diagnosis Date  . Anginal pain ([place])   . Arthritis   . CAD (coronary artery disease)    a. anterior stemi 08/10/2017; b. LHC 08/10/17: pLAD 70, p-mLAD 99 thrombotic, ostD1 70, p-mLCx 30, pRCA-1 50 calcified, pRCA-2 99 calcified, dRCA 30. Successful PCI/DES to p-mLAD x 2  . Cancer ([place])    skin  . CKD (chronic kidney disease), stage II   . COPD (chronic obstructive pulmonary disease) ([place])   .  Glaucoma   . History of kidney stones   . Hyperlipidemia   . Hypertension   . Insulin dependent diabetes mellitus   . Ischemic cardiomyopathy    a. TTE 08/10/17: technically difficult study, no diagnostic RWMA, EF 40-50%, poor windows, very difficult study   . Myocardial infarction ([place])   . Neuropathy   . S/P angioplasty with stent 08/10/17 emergently to LAD ,  08/13/17 atherectomy and stent to pRCA  08/14/2017    Patient Active Problem List   Diagnosis Date Noted  . Chest pain 08/02/2020  . Dyspnea on exertion 12/25/2019  . Acute respiratory failure with hypoxia ([place]) 12/25/2019  . Hypoxia 12/24/2019  . S/P angioplasty with stent 08/10/17 emergently to LAD ,  08/13/17 atherectomy and stent to [facility]  08/14/2017  . CAD in native artery 08/12/2017  . Leukocytosis 08/12/2017  . Insulin dependent diabetes mellitus 08/12/2017  . NSTEMI (non-ST elevated myocardial infarction) ([place]) 08/12/2017  . Acute ST elevation myocardial infarction (STEMI) involving left anterior descending (LAD) coronary artery ([place]) 08/10/2017  . ST elevation myocardial infarction involving left anterior descending (LAD) coronary artery ([place])   . COPD (chronic obstructive pulmonary disease) ([place]) 06/02/2017  . CAP (community acquired pneumonia) 01/25/2016  . Diabetes ([place]) 01/25/2016  . HTN (hypertension) 01/25/2016  . HLD (hyperlipidemia) 01/25/2016  . Benign essential hypertension 07/12/2013  Past Surgical History:  Procedure Laterality Date  . CARDIAC CATHETERIZATION    . COLONOSCOPY WITH PROPOFOL N/A 12/08/2014   Procedure: COLONOSCOPY WITH PROPOFOL;  Surgeon: Manya Silvas, MD;  Location: [facility] ENDOSCOPY;  Service: Endoscopy;  Laterality: N/A;  . COLONOSCOPY WITH PROPOFOL N/A 01/10/2016   Procedure: COLONOSCOPY WITH PROPOFOL;  Surgeon: Manya Silvas, MD;  Location: [facility] ENDOSCOPY;  Service: Endoscopy;  Laterality: N/A;  . COLONOSCOPY WITH PROPOFOL N/A 04/28/2019   Procedure: COLONOSCOPY WITH PROPOFOL;  Surgeon:  Robert Bellow, MD;  Location: [facility] ENDOSCOPY;  Service: Endoscopy;  Laterality: N/A;  . COLONOSCOPY WITH PROPOFOL N/A 03/17/2020   Procedure: COLONOSCOPY WITH PROPOFOL;  Surgeon: Robert Bellow, MD;  Location: [facility] ENDOSCOPY;  Service: Endoscopy;  Laterality: N/A;  . CORONARY ATHERECTOMY N/A 08/13/2017   Procedure: CORONARY ATHERECTOMY;  Surgeon: Wellington Hampshire, MD;  Location: [place] CV LAB;  Service: Cardiovascular;  Laterality: N/A;  . CORONARY STENT INTERVENTION N/A 08/13/2017   Procedure: CORONARY STENT INTERVENTION;  Surgeon: Wellington Hampshire, MD;  Location: [place] CV LAB;  Service: Cardiovascular;  Laterality: N/A;  . CORONARY/GRAFT ACUTE MI REVASCULARIZATION N/A 08/10/2017   Procedure: Coronary/Graft Acute MI Revascularization;  Surgeon: Burnell Blanks, MD;  Location: [place] CV LAB;  Service: Cardiovascular;  Laterality: N/A;  . EXTRACORPOREAL SHOCK WAVE LITHOTRIPSY Right 11/09/2015   Procedure: EXTRACORPOREAL SHOCK WAVE LITHOTRIPSY (ESWL);  Surgeon: Royston Cowper, MD;  Location: [facility] ORS;  Service: Urology;  Laterality: Right;  . EXTRACORPOREAL SHOCK WAVE LITHOTRIPSY Left 10/17/2016   Procedure: EXTRACORPOREAL SHOCK WAVE LITHOTRIPSY (ESWL);  Surgeon: Royston Cowper, MD;  Location: [facility] ORS;  Service: Urology;  Laterality: Left;  . LEFT HEART CATH AND CORONARY ANGIOGRAPHY N/A 08/10/2017   Procedure: LEFT HEART CATH AND CORONARY ANGIOGRAPHY;  Surgeon: Burnell Blanks, MD;  Location: [place] CV LAB;  Service: Cardiovascular;  Laterality: N/A;  . LITHOTRIPSY     x 6  . PAROTIDECTOMY  2015  . TEMPORARY PACEMAKER N/A 08/13/2017   Procedure: TEMPORARY PACEMAKER;  Surgeon: Wellington Hampshire, MD;  Location: [place] CV LAB;  Service: Cardiovascular;  Laterality: N/A;  . TONSILLECTOMY      Prior to Admission medications   Medication Sig Start Date End Date Taking? Authorizing Provider  acetaminophen (TYLENOL) 325 MG tablet Take 2 tablets (650 mg  total) by mouth every 6 (six) hours as needed for mild pain (or Fever >/= 101). 01/27/16   Gouru, Illene Silver, MD  albuterol (PROVENTIL HFA;VENTOLIN HFA) 108 (90 Base) MCG/ACT inhaler Inhale 2 puffs into the lungs every 6 (six) hours as needed for wheezing or shortness of breath. 01/27/16   Nicholes Mango, MD  aspirin EC 81 MG EC tablet Take 1 tablet (81 mg total) by mouth daily. 08/15/17   Isaiah Serge, NP  atorvastatin (LIPITOR) 80 MG tablet Take 1 tablet (80 mg total) by mouth daily at 6 PM. 08/14/17   Isaiah Serge, NP  cetirizine (ZYRTEC) 10 MG tablet Take 10 mg by mouth daily.    [provider]  fluticasone (FLONASE) 50 MCG/ACT nasal spray Place 1 spray into both nostrils daily.    [provider]  Fluticasone-Salmeterol (ADVAIR) 250-50 MCG/DOSE AEPB Inhale 1 puff into the lungs 2 (two) times daily.    [provider]  furosemide (LASIX) 20 MG tablet Take 20 mg by mouth daily. 12/06/19   [provider]  gabapentin (NEURONTIN) 600 MG tablet Take 600 mg by mouth 4 (four) times daily.  [provider]  insulin detemir (LEVEMIR) 100 UNIT/ML injection Inject 0.5 mLs (50 Units total) into the skin 2 (two) times daily. Patient taking differently: Inject 93 Units into the skin 2 (two) times daily. 08/12/17   Gladstone Lighter, MD  irbesartan (AVAPRO) 150 MG tablet Take 150 mg by mouth daily. 11/22/19   [provider]  metoprolol tartrate (LOPRESSOR) 25 MG tablet Take 1 tablet (25 mg total) by mouth 2 (two) times daily. 08/14/17   Isaiah Serge, NP  montelukast (SINGULAIR) 10 MG tablet Take 10 mg by mouth at bedtime.  07/10/17   [provider]  Multiple Vitamin (MULTIVITAMIN WITH MINERALS) TABS tablet Take 1 tablet by mouth daily.    [provider]  nitroGLYCERIN (NITROSTAT) 0.4 MG SL tablet PLACE 1 TABLET UNDER TONGUE EVERY 5 MIN AS NEEDED FOR CHEST PAIN IF NO RELIEF IN15 MIN CALL 911 (MAX 3 TABS) Patient taking differently: Place 0.4  mg under the tongue every 5 (five) minutes as needed for chest pain. IF NO RELIEF IN15 MIN CALL 911 (MAX 3 TABS) 06/29/19   Isaiah Serge, NP  sitaGLIPtin-metformin (JANUMET) 50-1000 MG tablet Take 1 tablet by mouth 2 (two) times daily with a meal.    [provider]    Allergies Patient has no known allergies.  Family History  Problem Relation Age of Onset  . Heart attack Mother   . Melanoma Father   . Parkinsonism Father     Social History Social History   Tobacco Use  . Smoking status: Former Smoker    Packs/day: 1.00    Years: 50.00    Pack years: 50.00    Types: Cigarettes    Quit date: 08/2017    Years since quitting: 2.9  . Smokeless tobacco: Never Used  . Tobacco comment: Has Quit as of May 2019- reviewed relaspe and will continue to followup  Vaping Use  . Vaping Use: Never used  Substance Use Topics  . Alcohol use: No  . Drug use: No    Review of Systems Constitutional: No fever/chills Eyes: No visual changes. ENT: No sore throat. Cardiovascular: Positive for chest pressure Respiratory: Positive for shortness of breath shortness of breath. Gastrointestinal: No abdominal pain.  No nausea, no vomiting.  No diarrhea.  No constipation. Genitourinary: Negative for dysuria. Musculoskeletal: Negative for neck pain.  Negative for back pain. Integumentary: Negative for rash. Neurological: Negative for headaches, focal weakness or numbness.   ____________________________________________   PHYSICAL EXAM:  VITAL SIGNS: ED Triage Vitals  Enc Vitals Group     BP 08/02/20 0457 (!) 149/87     Pulse Rate 08/02/20 0457 85     Resp 08/02/20 0457 18     Temp 08/02/20 0457 98.6 F (37 C)     Temp Source 08/02/20 0457 Oral     SpO2 08/02/20 0457 93 %     Weight 08/02/20 0501 104.3 kg (230 lb)     Height 08/02/20 0501 1.829 m (6')     Head Circumference --      Peak Flow --      Pain Score 08/02/20 0500 5     Pain Loc --      Pain Edu? --      Excl.  in [place]? --     Constitutional: Alert and oriented.  Appears nontoxic but uncomfortable. Eyes: Conjunctivae are normal.  Head: Atraumatic. Nose: No congestion/rhinnorhea. Mouth/Throat: Patient is wearing a mask. Neck: No stridor.  No meningeal signs.  Cardiovascular: Normal rate, regular rhythm. Good peripheral circulation. Respiratory: Normal respiratory effort with no accessory muscle usage and or retractions, but he is very slightly tachypneic. Gastrointestinal: Soft and nontender. No distention.  Musculoskeletal: Trace pretibial pitting edema. No gross deformities of extremities. Neurologic:  Normal speech and language. No gross focal neurologic deficits are appreciated.  Skin:  Skin is warm, dry and intact. Psychiatric: Mood and affect are normal. Speech and behavior are normal.  ____________________________________________   LABS (all labs ordered are listed, but only abnormal results are displayed)  Labs Reviewed  COMPREHENSIVE METABOLIC PANEL - Abnormal; Notable for the following components:      Result Value   Glucose, Bld 142 (*)    Creatinine, Ser 1.33 (*)    GFR, Estimated 56 (*)    All other components within normal limits  LIPASE, BLOOD - Abnormal; Notable for the following components:   Lipase 54 (*)    All other components within normal limits  CBC WITH DIFFERENTIAL/PLATELET - Abnormal; Notable for the following components:   MCH 25.9 (*)    RDW 17.1 (*)    Monocytes Absolute 1.3 (*)    All other components within normal limits  RESP PANEL BY RT-PCR (FLU A&B, COVID) ARPGX2  BRAIN NATRIURETIC PEPTIDE  PROTIME-INR  APTT  HEPARIN LEVEL (UNFRACTIONATED)  TROPONIN I (HIGH SENSITIVITY)   ____________________________________________  EKG  ED ECG REPORT I, Hinda Kehr, the attending physician, personally viewed and interpreted this ECG.  Date: 08/02/2020 EKG Time: 4:57 AM Rate: 80 Rhythm: normal sinus rhythm QRS Axis: Right axis deviation Intervals:  normal ST/T Wave abnormalities: Non-specific ST segment / T-wave changes, but no clear evidence of acute ischemia. Narrative Interpretation: no definitive evidence of acute ischemia; does not meet STEMI criteria.   ____________________________________________  RADIOLOGY I, Hinda Kehr, personally viewed and evaluated these images (plain radiographs) as part of my medical decision making, as well as reviewing the written report by the radiologist.  ED MD interpretation: No acute abnormality identified on chest x-ray  Official radiology report(s): DG Chest Portable 1 View  Result Date: 08/02/2020 CLINICAL DATA:  75 year old male with acute chest pain and shortness of breath at 0300 hours. EXAM: PORTABLE CHEST 1 VIEW COMPARISON:  Chest CTA 12/24/2019 and earlier. FINDINGS: Portable AP upright view at 0525 hours. Mediastinal contours remain normal. Stable lung volumes. Chronic lower and middle lobe scarring as demonstrated by CTA last year appears stable. No superimposed pneumothorax, pulmonary edema, pleural effusion or new pulmonary opacity. No acute osseous abnormality identified. IMPRESSION: Chronic lower and middle lobe scarring. No acute cardiopulmonary abnormality. Electronically Signed   By: Genevie Ann M.D.   On: 08/02/2020 05:40    ____________________________________________   PROCEDURES   Procedure(s) performed (including Critical Care):  .1-3 Lead EKG Interpretation Performed by: Hinda Kehr, MD Authorized by: Hinda Kehr, MD     Interpretation: normal     ECG rate:  78   ECG rate assessment: normal     Rhythm: sinus rhythm     Ectopy: none     Conduction: normal    .Critical Care Performed by: Hinda Kehr, MD Authorized by: Hinda Kehr, MD   Critical care provider statement:    Critical care time (minutes):  40   Critical care time was exclusive of:  Separately billable procedures and treating other patients   Critical care was necessary to treat or prevent  imminent or life-threatening deterioration of the following conditions:  Cardiac failure (unstable angina requiring heparin)   Critical care  was time spent personally by me on the following activities:  Development of treatment plan with patient or surrogate, discussions with consultants, evaluation of patient's response to treatment, examination of patient, obtaining history from patient or surrogate, ordering and performing treatments and interventions, ordering and review of laboratory studies, ordering and review of radiographic studies, pulse oximetry, re-evaluation of patient's condition and review of old charts     ____________________________________________   [place] / MDM / [place] / ED COURSE  As part of my medical decision making, I reviewed the following data within the [place] notes reviewed and incorporated, Labs reviewed , EKG interpreted , Old chart reviewed, Radiograph reviewed , Discussed with admitting physician (Dr. Sidney Ace) and Notes from prior ED visits   Differential diagnosis includes, but is not limited to, ACS including unstable angina, PE, AAS, pneumonia, COVID-19, COPD exacerbation, new onset CHF.  The patient is on the cardiac monitor to evaluate for evidence of arrhythmia and/or significant heart rate changes.  He has no tachycardia and only has tachypnea when he moves around or exerts himself but he is feeling short of breath.  He has no prior history of PE and no risk factors for PE.  He does have significant chronic lung disease and sees Dr. Lanney Gins with pulmonology.  I reviewed the patient's medical record and his last echocardiogram was about 7 months ago and he had a preserved ejection fraction of 60 to 65%.  He was awakened from sleep with chest pressure and shortness of breath.  His oxygen saturation is in the low 90s and he is not typically use supplemental oxygen.  His blood pressure is slightly elevated,  particularly diastolic, but not substantially.  I am most worried about ACS.  Lab work pending.  Chest x-ray pending.  I gave another sublingual nitroglycerin and it did not make a difference.  I will hold off on using any additional nitro since it does not seem to be helpful for him and I do not want to drop his blood pressure.     Clinical Course as of 08/02/20 0634  Wed Aug 02, 2020  0529 Of note I was informed by the patient's nurse that he desatted to 87% and is now on 2 L of oxygen by nasal cannula. [CF]  [id number] CBC with Differential/Platelet(!) Normal CBC [CF]  0545 DG Chest Portable 1 View I personally reviewed the patient's imaging and agree with the radiologist's interpretation that there is no evidence of any acute changes.  He has some chronic viral disease but no evidence of pneumonia, widened mediastinum, pulmonary edema, or other emergent finding. [CF]  0546 Comprehensive metabolic panel(!) Essentially normal CMP with chronic kidney disease and a creatinine of 1.33.  CBC is within normal limits.  Lipase is very slightly elevated at 54 which is of unclear clinical significance but likely does not represent acute pancreatitis.  High-sensitivity troponin is 6.  Coagulation studies are normal. [CF]  0546 Troponin I (High Sensitivity): 6 [CF]  0600 Patient continues to feel short of breath with moderate chest pressure.  His blood pressure has come down to 105/56 and I am not comfortable giving him any additional nitroglycerin.  He does not appear volume overloaded.  I am concerned that he is experiencing unstable angina given his extensive history of CAD and ACS and new onset of symptoms awoke him up during the night.  He is also having acute respiratory failure with hypoxemia.  I talked with the patient  and he agrees that this is not normal for him and he agrees to stay in the hospital.  He has no tenderness to palpation of the abdomen I think the very slight lipase elevation is not  clinically significant.    Given my concern for ACS, I am ordering heparin bolus plus infusion per ACS protocol and will admit to the hospitalist for further management and cardiology consultation. [CF]  0601 Of note, I considered ordering advanced imaging of his lungs such as a CTA chest for PE and/or dissection, but the nature of the patient's symptoms is such that I think that ACS is much more likely.  If the cardiologist or hospitalist feel that he needs advanced imaging I will defer to them.  However, I am already treating empirically with heparin bolus + infusion which is appropriate for PE treatment as well. [CF]  0621 B Natriuretic Peptide: 22.6 [CF]  0621 Discussed case by secure chat with Dr. Sidney Ace who will admit. [CF]    Clinical Course User Index [CF] Hinda Kehr, MD     ____________________________________________  FINAL CLINICAL IMPRESSION(S) / ED DIAGNOSES  Final diagnoses:  Unstable angina ([place])  Acute on chronic respiratory failure with hypoxemia ([place])     MEDICATIONS GIVEN DURING THIS VISIT:  Medications  heparin ADULT infusion 100 units/mL (25000 units/293mL) (1,400 Units/hr Intravenous New Bag/Given 08/02/20 0630)  nitroGLYCERIN (NITROSTAT) SL tablet 0.4 mg (0.4 mg Sublingual Given 08/02/20 0510)  aspirin chewable tablet 243 mg (243 mg Oral Given 08/02/20 0532)  heparin bolus via infusion 4,000 Units (4,000 Units Intravenous Bolus from Bag 08/02/20 0630)     ED Discharge Orders    None      *Please note:  Henry [name] was evaluated in Emergency Department on 08/02/2020 for the symptoms described in the history of present illness. He was evaluated in the context of the global COVID-19 pandemic, which necessitated consideration that the patient might be at risk for infection with the SARS-CoV-2 virus that causes COVID-19. Institutional protocols and algorithms that pertain to the evaluation of patients at risk for COVID-19 are in a state of rapid change based  on information released by regulatory bodies including the CDC and federal and state organizations. These policies and algorithms were followed during the patient's care in the ED.  Some ED evaluations and interventions may be delayed as a result of limited staffing during and after the pandemic.*  Note:  This document was prepared using Dragon voice recognition software and may include unintentional dictation errors.   Hinda Kehr, MD 08/02/20 [place], [place], MD 08/02/20 [phone]

## 2020-08-02 NOTE — ED Notes (Signed)
X-ray at bedside

## 2020-08-02 NOTE — ED Notes (Signed)
ED Provider at bedside. 

## 2020-08-02 NOTE — ED Notes (Signed)
Pt chest pain not relieved by nitro. No change noted. O2 sats dropped to 87% on RA. Placed on 3L via [place]. MD aware.

## 2020-08-02 NOTE — Progress Notes (Signed)
ANTICOAGULATION CONSULT NOTE   Pharmacy Consult for Heparin  Indication: chest pain/ACS  Patient Measurements:   Heparin Dosing Weight: 99.2 kg   Vital Signs: Temp: 98.5 F (36.9 C) (04/27 0827) Temp Source: Oral (04/27 0827) BP: 133/58 (04/27 0827) Pulse Rate: 74 (04/27 0827)  Labs: Recent Labs    08/02/20 0459 08/02/20 0720 08/02/20 0923 08/02/20 1421  HGB 14.3  --   --   --   HCT 45.4  --   --   --   PLT 299  --   --   --   APTT 32  --   --   --   LABPROT 12.3  --   --   --   INR 0.9  --   --   --   HEPARINUNFRC  --   --   --  <0.10*  CREATININE 1.33*  --   --   --   TROPONINIHS 6 6 5 5     Estimated Creatinine Clearance: 60.9 mL/min (A) (by C-G formula based on SCr of 1.33 mg/dL (H)).   Medical History: Past Medical History:  Diagnosis Date  . Anginal pain ([place])   . Arthritis   . CAD (coronary artery disease)    a. anterior stemi 08/10/2017; b. LHC 08/10/17: pLAD 70, p-mLAD 99 thrombotic, ostD1 70, p-mLCx 30, pRCA-1 50 calcified, pRCA-2 99 calcified, dRCA 30. Successful PCI/DES to p-mLAD x 2  . Cancer ([place])    skin  . CKD (chronic kidney disease), stage II   . COPD (chronic obstructive pulmonary disease) ([place])   . Glaucoma   . History of kidney stones   . Hyperlipidemia   . Hypertension   . Insulin dependent diabetes mellitus   . Ischemic cardiomyopathy    a. TTE 08/10/17: technically difficult study, no diagnostic RWMA, EF 40-50%, poor windows, very difficult study   . Myocardial infarction ([place])   . Neuropathy   . S/P angioplasty with stent 08/10/17 emergently to LAD ,  08/13/17 atherectomy and stent to pRCA  08/14/2017    Assessment: Pharmacy consulted to dose heparin in this 75 year old male admitted with ACS/Chest pain. Cardiology ruled out MI this admission and will continue heparin until further evaluation is done. Pt will undergo stress test 4/27.   CrCl = 60.9 ml/min No prior anticoag noted.   Baseline Hgb: 14.3 Baseline Plt: 299  Date       Time          HL     Assessment           Rate       04/27      14:21       <0.10 Subtherapeutic      1400 U/hr   Goal of Therapy:  Heparin level 0.3-0.7 units/ml Monitor platelets by anticoagulation protocol: Yes   Plan:  Heparin level was subtherapeutic, will give 3000 unit bolus and increase heparin rate to 1700 units/hr  Recheck HL in 8 hours  Monitor CBC daily while on heparin   Doreatha Massed, Student-PharmD 08/02/2020,3:28 PM

## 2020-08-03 ENCOUNTER — Encounter: Payer: Self-pay | Admitting: Internal Medicine

## 2020-08-03 DIAGNOSIS — I200 Unstable angina: Secondary | ICD-10-CM | POA: Diagnosis not present

## 2020-08-03 LAB — CBC
HCT: 44 % (ref 39.0–52.0)
Hemoglobin: 13.9 g/dL (ref 13.0–17.0)
MCH: 26 pg (ref 26.0–34.0)
MCHC: 31.6 g/dL (ref 30.0–36.0)
MCV: 82.4 fL (ref 80.0–100.0)
Platelets: 276 10*3/uL (ref 150–400)
RBC: 5.34 MIL/uL (ref 4.22–5.81)
RDW: 17.2 % — ABNORMAL HIGH (ref 11.5–15.5)
WBC: 8.7 10*3/uL (ref 4.0–10.5)
nRBC: 0 % (ref 0.0–0.2)

## 2020-08-03 LAB — GLUCOSE, CAPILLARY: Glucose-Capillary: 163 mg/dL — ABNORMAL HIGH (ref 70–99)

## 2020-08-03 LAB — HEPARIN LEVEL (UNFRACTIONATED): Heparin Unfractionated: 0.55 IU/mL (ref 0.30–0.70)

## 2020-08-03 MED ORDER — INSULIN DETEMIR 100 UNIT/ML ~~LOC~~ SOLN: 93.0000 [IU] | Freq: Two times a day (BID) | SUBCUTANEOUS | Status: DC

## 2020-08-03 NOTE — Progress Notes (Signed)
[facility] Encounter Note  Patient: Henry [name] / Admit Date: 08/02/2020 / Date of Encounter: 08/03/2020, 7:05 AM   Subjective: Patient has had full resolution of chest discomfort and no further issues.  No evidence of elevation of troponin or EKG changes.  Stress test shows normal myocardial perfusion without evidence of myocardial ischemia. Overnight the patient has done well with no further significant symptoms and has ambulated well and tolerating all of his medications.  Review of Systems: Positive for: None Negative for: Vision change, hearing change, syncope, dizziness, nausea, vomiting,diarrhea, bloody stool, stomach pain, cough, congestion, diaphoresis, urinary frequency, urinary pain,skin lesions, skin rashes Others previously listed  Objective: Telemetry: Normal sinus rhythm Physical Exam: Blood pressure 124/81, pulse 68, temperature 98.4 F (36.9 C), temperature source Oral, resp. rate 16, height 6' (1.829 m), weight 104.3 kg, SpO2 97 %. Body mass index is 31.19 kg/m. General: Well developed, well nourished, in no acute distress. Head: Normocephalic, atraumatic, sclera non-icteric, no xanthomas, nares are without discharge. Neck: No apparent masses Lungs: Normal respirations with no wheezes, no rhonchi, no rales , no crackles   Heart: Regular rate and rhythm, normal S1 S2, no murmur, no rub, no gallop, PMI is normal size and placement, carotid upstroke normal without bruit, jugular venous pressure normal Abdomen: Soft, non-tender, non-distended with normoactive bowel sounds. No hepatosplenomegaly. Abdominal aorta is normal size without bruit Extremities: No edema, no clubbing, no cyanosis, no ulcers,  Peripheral: 2+ radial, 2+ femoral, 2+ dorsal pedal pulses Neuro: Alert and oriented. Moves all extremities spontaneously. Psych:  Responds to questions appropriately with a normal affect.   Intake/Output Summary (Last 24 hours) at 08/03/2020  0705 Last data filed at 08/02/2020 1745 Gross per 24 hour  Intake 600 ml  Output 775 ml  Net -175 ml    Inpatient Medications:  . aspirin EC  81 mg Oral Daily  . atorvastatin  80 mg Oral Daily  . fluticasone  1 spray Each Nare Daily  . furosemide  20 mg Oral Daily  . gabapentin  600 mg Oral QID  . insulin aspart  0-15 Units Subcutaneous TID WC  . Ipratropium-Albuterol  1 puff Inhalation QID  . irbesartan  150 mg Oral Daily  . loratadine  10 mg Oral Daily  . mouth rinse  15 mL Mouth Rinse BID  . metoprolol tartrate  25 mg Oral BID  . montelukast  10 mg Oral QHS  . multivitamin with minerals  1 tablet Oral Daily  . tamsulosin  0.4 mg Oral Daily   Infusions:    Labs: Recent Labs    08/02/20 0459  NA 137  K 4.3  CL 98  CO2 28  GLUCOSE 142*  BUN 21  CREATININE 1.33*  CALCIUM 9.9   Recent Labs    08/02/20 0459  AST 26  ALT 21  ALKPHOS 65  BILITOT 0.5  PROT 7.2  ALBUMIN 4.0   Recent Labs    08/02/20 0459 08/03/20 0524  WBC 9.8 8.7  NEUTROABS 5.7  --   HGB 14.3 13.9  HCT 45.4 44.0  MCV 82.1 82.4  PLT 299 276   No results for input(s): CKTOTAL, CKMB, TROPONINI in the last 72 hours. Invalid input(s): POCBNP Recent Labs    08/02/20 0923  HGBA1C 8.3*     Weights: Filed Weights   08/02/20 0501 08/02/20 0827  Weight: 104.3 kg 104.3 kg     Radiology/Studies:  NM Myocar Multi W/Spect W/Wall Motion / EF  Result Date:  08/02/2020  The study is normal.  This is a low risk study.  The left ventricular ejection fraction is normal (55-65%).    DG Chest Portable 1 View  Result Date: 08/02/2020 CLINICAL DATA:  75 year old male with acute chest pain and shortness of breath at 0300 hours. EXAM: PORTABLE CHEST 1 VIEW COMPARISON:  Chest CTA 12/24/2019 and earlier. FINDINGS: Portable AP upright view at 0525 hours. Mediastinal contours remain normal. Stable lung volumes. Chronic lower and middle lobe scarring as demonstrated by CTA last year appears stable. No  superimposed pneumothorax, pulmonary edema, pleural effusion or new pulmonary opacity. No acute osseous abnormality identified. IMPRESSION: Chronic lower and middle lobe scarring. No acute cardiopulmonary abnormality. Electronically Signed   By: Genevie Ann M.D.   On: 08/02/2020 05:40   ECHOCARDIOGRAM COMPLETE  Result Date: 08/02/2020    ECHOCARDIOGRAM REPORT   Patient Name:   Henry [name] Date of Exam: 08/02/2020 Medical Rec #:  [id number]          Height:       72.0 in Accession #:    [id number]         Weight:       230.0 lb Date of Birth:  [birth year]          BSA:          2.261 m Patient Age:    75 years           BP:           133/58 mmHg Patient Gender: M                  HR:           68 bpm. Exam Location:  [facility] Procedure: 2D Echo, Color Doppler, Cardiac Doppler and Intracardiac            Opacification Agent Indications:     R07.9 Chest Pain  History:         Patient has prior history of Echocardiogram examinations. ICM,                  Previous Myocardial Infarction and CAD, Kidney disease; Risk                  Factors:Dyslipidemia.  Sonographer:     Charmayne Sheer RDCS (AE) Referring Phys:  [id number] Collier Bullock Diagnosing Phys: Serafina Royals MD  Sonographer Comments: Technically difficult study due to poor echo windows. Image acquisition challenging due to patient body habitus. IMPRESSIONS  1. Left ventricular ejection fraction, by estimation, is 55 to 60%. The left ventricle has normal function. The left ventricle has no regional wall motion abnormalities. Left ventricular diastolic parameters were normal.  2. Right ventricular systolic function is normal. The right ventricular size is normal.  3. The mitral valve is normal in structure. Trivial mitral valve regurgitation.  4. The aortic valve is normal in structure. Aortic valve regurgitation is not visualized. FINDINGS  Left Ventricle: Left ventricular ejection fraction, by estimation, is 55 to 60%. The left ventricle has normal function. The  left ventricle has no regional wall motion abnormalities. Definity contrast agent was given IV to delineate the left ventricular  endocardial borders. The left ventricular internal cavity size was normal in size. There is no left ventricular hypertrophy. Left ventricular diastolic parameters were normal. Right Ventricle: The right ventricular size is normal. No increase in right ventricular wall thickness. Right ventricular systolic function is normal. Left Atrium: Left atrial size was normal in  size. Right Atrium: Right atrial size was normal in size. Pericardium: There is no evidence of pericardial effusion. Mitral Valve: The mitral valve is normal in structure. Trivial mitral valve regurgitation. MV peak gradient, 2.5 mmHg. The mean mitral valve gradient is 1.0 mmHg. Tricuspid Valve: The tricuspid valve is normal in structure. Tricuspid valve regurgitation is mild. Aortic Valve: The aortic valve is normal in structure. Aortic valve regurgitation is not visualized. Aortic valve mean gradient measures 3.0 mmHg. Aortic valve peak gradient measures 7.1 mmHg. Aortic valve area, by VTI measures 4.26 cm. Pulmonic Valve: The pulmonic valve was normal in structure. Pulmonic valve regurgitation is not visualized. Aorta: The aortic root and ascending aorta are structurally normal, with no evidence of dilitation. IAS/Shunts: No atrial level shunt detected by color flow Doppler.  LEFT VENTRICLE PLAX 2D LVIDd:         4.30 cm  Diastology LVIDs:         3.00 cm  LV e' medial:    5.00 cm/s LV PW:         1.00 cm  LV E/e' medial:  14.0 LV IVS:        1.00 cm  LV e' lateral:   7.51 cm/s LVOT diam:     2.70 cm  LV E/e' lateral: 9.3 LV SV:         110 LV SV Index:   49 LVOT Area:     5.73 cm  RIGHT VENTRICLE RV Basal diam:  3.70 cm LEFT ATRIUM             Index       RIGHT ATRIUM           Index LA diam:        3.70 cm 1.64 cm/m  RA Area:     17.70 cm LA Vol (A2C):   43.6 ml 19.28 ml/m RA Volume:   53.50 ml  23.66 ml/m LA Vol  (A4C):   53.1 ml 23.49 ml/m LA Biplane Vol: 52.9 ml 23.40 ml/m  AORTIC VALVE                   PULMONIC VALVE AV Area (Vmax):    4.56 cm    PV Vmax:       0.71 m/s AV Area (Vmean):   4.85 cm    PV Vmean:      46.300 cm/s AV Area (VTI):     4.26 cm    PV VTI:        0.128 m AV Vmax:           133.00 cm/s PV Peak grad:  2.0 mmHg AV Vmean:          85.300 cm/s PV Mean grad:  1.0 mmHg AV VTI:            0.258 m AV Peak Grad:      7.1 mmHg AV Mean Grad:      3.0 mmHg LVOT Vmax:         106.00 cm/s LVOT Vmean:        72.300 cm/s LVOT VTI:          0.192 m LVOT/AV VTI ratio: 0.74  AORTA Ao Root diam: 3.20 cm MITRAL VALVE MV Area (PHT): 2.53 cm    SHUNTS MV Area VTI:   5.05 cm    Systemic VTI:  0.19 m MV Peak grad:  2.5 mmHg    Systemic Diam: 2.70 cm MV Mean grad:  1.0 mmHg  MV Vmax:       0.80 m/s MV Vmean:      40.7 cm/s MV Decel Time: 300 msec MV E velocity: 69.80 cm/s MV A velocity: 77.10 cm/s MV E/A ratio:  0.91 Serafina Royals MD Electronically signed by Serafina Royals MD Signature Date/Time: 08/02/2020/5:37:50 PM    Final    US Abdomen Limited RUQ (LIVER/GB)  Result Date: 08/02/2020 CLINICAL DATA:  75 year old male with acute pain x6 hours. EXAM: ULTRASOUND ABDOMEN LIMITED RIGHT UPPER QUADRANT COMPARISON:  Portable chest x-ray earlier today. CTA chest 12/24/2019. FINDINGS: Gallbladder: No gallstones or wall thickening visualized. No sonographic Murphy sign noted by sonographer. Common bile duct: Diameter: 4 mm, normal. Liver: No focal lesion identified. Within normal limits in parenchymal echogenicity. Portal vein is patent on color Doppler imaging with normal direction of blood flow towards the liver. Other: Grossly negative visible right kidney. IMPRESSION: Negative right upper quadrant ultrasound. Electronically Signed   By: Genevie Ann M.D.   On: 08/02/2020 09:05     Assessment and Recommendation  75 y.o. male with known coronary artery disease status post multiple previous stenting in the past  hypertension hyperlipidemia on appropriate medication management with atypical chest discomfort and no current evidence of congestive heart failure acute coronary syndrome and or myocardial infarction with a normal stress test and no evidence of myocardial ischemia 1.  Patient okay for discharge home from cardiac standpoint if ambulating well without any further significant symptoms with follow-up in 1 to 2 weeks 2.  Continue current medical regimen for hypertension hyperlipidemia and single antiplatelet therapy as before 3.  No further cardiac diagnostic necessary at this time  Signed, Serafina Royals M.D. FACC

## 2020-08-03 NOTE — Progress Notes (Signed)
ANTICOAGULATION CONSULT NOTE   Pharmacy Consult for Heparin  Indication: chest pain/ACS  Patient Measurements:   Heparin Dosing Weight: 99.2 kg   Vital Signs: Temp: 98.5 F (36.9 C) (04/27 0827) Temp Source: Oral (04/27 0827) BP: 133/58 (04/27 0827) Pulse Rate: 74 (04/27 0827)  Labs: Recent Labs    08/02/20 0459 08/02/20 0720 08/02/20 0923 08/02/20 1421  HGB 14.3  --   --   --   HCT 45.4  --   --   --   PLT 299  --   --   --   APTT 32  --   --   --   LABPROT 12.3  --   --   --   INR 0.9  --   --   --   HEPARINUNFRC  --   --   --  <0.10*  CREATININE 1.33*  --   --   --   TROPONINIHS 6 6 5 5     Estimated Creatinine Clearance: 60.9 mL/min (A) (by C-G formula based on SCr of 1.33 mg/dL (H)).   Medical History: Past Medical History:  Diagnosis Date  . Anginal pain ([place])   . Arthritis   . CAD (coronary artery disease)    a. anterior stemi 08/10/2017; b. LHC 08/10/17: pLAD 70, p-mLAD 99 thrombotic, ostD1 70, p-mLCx 30, pRCA-1 50 calcified, pRCA-2 99 calcified, dRCA 30. Successful PCI/DES to p-mLAD x 2  . Cancer ([place])    skin  . CKD (chronic kidney disease), stage II   . COPD (chronic obstructive pulmonary disease) ([place])   . Glaucoma   . History of kidney stones   . Hyperlipidemia   . Hypertension   . Insulin dependent diabetes mellitus   . Ischemic cardiomyopathy    a. TTE 08/10/17: technically difficult study, no diagnostic RWMA, EF 40-50%, poor windows, very difficult study   . Myocardial infarction ([place])   . Neuropathy   . S/P angioplasty with stent 08/10/17 emergently to LAD ,  08/13/17 atherectomy and stent to pRCA  08/14/2017    Assessment: Pharmacy consulted to dose heparin in this 75 year old male admitted with ACS/Chest pain. Cardiology ruled out MI this admission and will continue heparin until further evaluation is done. Pt will undergo stress test 4/27.   CrCl = 60.9 ml/min No prior anticoag noted.   Baseline Hgb: 14.3 Baseline Plt: 299  Date       Time          HL     Assessment           Rate       04/27      14:21       <0.10 Subtherapeutic      1400 U/hr 04/28      00:09       0.55    Therapeutic           1700 U/hr   Goal of Therapy:  Heparin level 0.3-0.7 units/ml Monitor platelets by anticoagulation protocol: Yes   Plan:  4/28:  HL @ 0009 = 0.55 Will continue pt on current rate and draw confirmation level in 8 hrs @ 0800.   Vicke Plotner D 08/02/2020,3:28 PM

## 2020-08-03 NOTE — Care Management Obs Status (Signed)
[place] NOTIFICATION   Patient Details  Name: Henry [name] MRN: [id number] Date of Birth: 1945/09/20   Medicare Observation Status Notification Given:  Yes    Gerrianne Scale Rowin Bayron, LCSW 08/03/2020, 11:35 AM

## 2020-08-03 NOTE — Discharge Summary (Signed)
Physician Discharge Summary  Even Budlong Relph CBS:[id number] DOB: Aug 15, 1945 DOA: 08/02/2020  PCP: Juluis Pitch, MD  Admit date: 08/02/2020 Discharge date: 08/03/2020  Discharge disposition: Home   Recommendations for Outpatient Follow-Up:   Follow-up with PCP in 1 week Follow-up with cardiologist, Dr. Nehemiah Massed, in 1 to 2 weeks   Discharge Diagnosis:   Principal Problem:   Unstable angina [facility]) Active Problems:   Diabetes ([place])   COPD (chronic obstructive pulmonary disease) ([place])   Benign essential hypertension   Chest pain    Discharge Condition: Stable.  Diet recommendation:  Diet Order            Diet - low sodium heart healthy           Diet Carb Modified           Diet Carb Modified Fluid consistency: Thin; Room service appropriate? Yes  Diet effective now                   Code Status: Full Code     Hospital Course:   Mr. [name] is a 75 year old man with medical history significant for CAD s/p coronary stent, hypertension, COPD, who presented to the hospital because of chest pain, nausea, palpitations and some breath.  It was similar to the chest pain he had when he had a heart attack about 3 years ago.  He was admitted to the hospital for unstable angina.  He was treated with IV heparin infusion.  Cardiologist was consulted.  2D echo showed EF estimated at 55 to 60%. Myocardial perfusion stress test was unremarkable.  Symptoms have resolved.  He has been able to ambulate without any problems.  From cardiology standpoint, patient is okay for discharge.     Discharge Exam:    Vitals:   08/02/20 1622 08/02/20 2056 08/03/20 0408 08/03/20 0845  BP: 117/72 131/79 124/81 134/72  Pulse: 82 89 68 69  Resp: 18 18 16 18   Temp: 98.3 F (36.8 C) 98.4 F (36.9 C) 98.4 F (36.9 C) 97.7 F (36.5 C)  TempSrc:  Oral Oral Oral  SpO2: 98% 95% 97% 93%  Weight:      Height:         GEN: NAD SKIN: Warm and dry EYES: EOMI ENT: MMM CV:  RRR PULM: CTA B ABD: soft, ND, NT, +BS CNS: AAO x 3, non focal EXT: No edema or tenderness   The results of significant diagnostics from this hospitalization (including imaging, microbiology, ancillary and laboratory) are listed below for reference.     Procedures and Diagnostic Studies:   NM Myocar Multi W/Spect W/Wall Motion / EF  Result Date: 08/02/2020  The study is normal.  This is a low risk study.  The left ventricular ejection fraction is normal (55-65%).    DG Chest Portable 1 View  Result Date: 08/02/2020 CLINICAL DATA:  75 year old male with acute chest pain and shortness of breath at 0300 hours. EXAM: PORTABLE CHEST 1 VIEW COMPARISON:  Chest CTA 12/24/2019 and earlier. FINDINGS: Portable AP upright view at 0525 hours. Mediastinal contours remain normal. Stable lung volumes. Chronic lower and middle lobe scarring as demonstrated by CTA last year appears stable. No superimposed pneumothorax, pulmonary edema, pleural effusion or new pulmonary opacity. No acute osseous abnormality identified. IMPRESSION: Chronic lower and middle lobe scarring. No acute cardiopulmonary abnormality. Electronically Signed   By: Genevie Ann M.D.   On: 08/02/2020 05:40   ECHOCARDIOGRAM COMPLETE  Result Date: 08/02/2020    ECHOCARDIOGRAM  REPORT   Patient Name:   Henry [name] [employer] Date of Exam: 08/02/2020 Medical Rec #:  [id number]          Height:       72.0 in Accession #:    [id number]         Weight:       230.0 lb Date of Birth:  1946/03/26          BSA:          2.261 m Patient Age:    75 years           BP:           133/58 mmHg Patient Gender: M                  HR:           68 bpm. Exam Location:  [facility] Procedure: 2D Echo, Color Doppler, Cardiac Doppler and Intracardiac            Opacification Agent Indications:     R07.9 Chest Pain  History:         Patient has prior history of Echocardiogram examinations. ICM,                  Previous Myocardial Infarction and CAD, Kidney disease; Risk                   Factors:Dyslipidemia.  Sonographer:     Charmayne Sheer RDCS (AE) Referring Phys:  [id number] Collier Bullock Diagnosing Phys: Serafina Royals MD  Sonographer Comments: Technically difficult study due to poor echo windows. Image acquisition challenging due to patient body habitus. IMPRESSIONS  1. Left ventricular ejection fraction, by estimation, is 55 to 60%. The left ventricle has normal function. The left ventricle has no regional wall motion abnormalities. Left ventricular diastolic parameters were normal.  2. Right ventricular systolic function is normal. The right ventricular size is normal.  3. The mitral valve is normal in structure. Trivial mitral valve regurgitation.  4. The aortic valve is normal in structure. Aortic valve regurgitation is not visualized. FINDINGS  Left Ventricle: Left ventricular ejection fraction, by estimation, is 55 to 60%. The left ventricle has normal function. The left ventricle has no regional wall motion abnormalities. Definity contrast agent was given IV to delineate the left ventricular  endocardial borders. The left ventricular internal cavity size was normal in size. There is no left ventricular hypertrophy. Left ventricular diastolic parameters were normal. Right Ventricle: The right ventricular size is normal. No increase in right ventricular wall thickness. Right ventricular systolic function is normal. Left Atrium: Left atrial size was normal in size. Right Atrium: Right atrial size was normal in size. Pericardium: There is no evidence of pericardial effusion. Mitral Valve: The mitral valve is normal in structure. Trivial mitral valve regurgitation. MV peak gradient, 2.5 mmHg. The mean mitral valve gradient is 1.0 mmHg. Tricuspid Valve: The tricuspid valve is normal in structure. Tricuspid valve regurgitation is mild. Aortic Valve: The aortic valve is normal in structure. Aortic valve regurgitation is not visualized. Aortic valve mean gradient measures 3.0 mmHg. Aortic valve  peak gradient measures 7.1 mmHg. Aortic valve area, by VTI measures 4.26 cm. Pulmonic Valve: The pulmonic valve was normal in structure. Pulmonic valve regurgitation is not visualized. Aorta: The aortic root and ascending aorta are structurally normal, with no evidence of dilitation. IAS/Shunts: No atrial level shunt detected by color flow Doppler.  LEFT VENTRICLE PLAX 2D LVIDd:  4.30 cm  Diastology LVIDs:         3.00 cm  LV e' medial:    5.00 cm/s LV PW:         1.00 cm  LV E/e' medial:  14.0 LV IVS:        1.00 cm  LV e' lateral:   7.51 cm/s LVOT diam:     2.70 cm  LV E/e' lateral: 9.3 LV SV:         110 LV SV Index:   49 LVOT Area:     5.73 cm  RIGHT VENTRICLE RV Basal diam:  3.70 cm LEFT ATRIUM             Index       RIGHT ATRIUM           Index LA diam:        3.70 cm 1.64 cm/m  RA Area:     17.70 cm LA Vol (A2C):   43.6 ml 19.28 ml/m RA Volume:   53.50 ml  23.66 ml/m LA Vol (A4C):   53.1 ml 23.49 ml/m LA Biplane Vol: 52.9 ml 23.40 ml/m  AORTIC VALVE                   PULMONIC VALVE AV Area (Vmax):    4.56 cm    PV Vmax:       0.71 m/s AV Area (Vmean):   4.85 cm    PV Vmean:      46.300 cm/s AV Area (VTI):     4.26 cm    PV VTI:        0.128 m AV Vmax:           133.00 cm/s PV Peak grad:  2.0 mmHg AV Vmean:          85.300 cm/s PV Mean grad:  1.0 mmHg AV VTI:            0.258 m AV Peak Grad:      7.1 mmHg AV Mean Grad:      3.0 mmHg LVOT Vmax:         106.00 cm/s LVOT Vmean:        72.300 cm/s LVOT VTI:          0.192 m LVOT/AV VTI ratio: 0.74  AORTA Ao Root diam: 3.20 cm MITRAL VALVE MV Area (PHT): 2.53 cm    SHUNTS MV Area VTI:   5.05 cm    Systemic VTI:  0.19 m MV Peak grad:  2.5 mmHg    Systemic Diam: 2.70 cm MV Mean grad:  1.0 mmHg MV Vmax:       0.80 m/s MV Vmean:      40.7 cm/s MV Decel Time: 300 msec MV E velocity: 69.80 cm/s MV A velocity: 77.10 cm/s MV E/A ratio:  0.91 Serafina Royals MD Electronically signed by Serafina Royals MD Signature Date/Time: 08/02/2020/5:37:50 PM    Final     US Abdomen Limited RUQ (LIVER/GB)  Result Date: 08/02/2020 CLINICAL DATA:  75 year old male with acute pain x6 hours. EXAM: ULTRASOUND ABDOMEN LIMITED RIGHT UPPER QUADRANT COMPARISON:  Portable chest x-ray earlier today. CTA chest 12/24/2019. FINDINGS: Gallbladder: No gallstones or wall thickening visualized. No sonographic Murphy sign noted by sonographer. Common bile duct: Diameter: 4 mm, normal. Liver: No focal lesion identified. Within normal limits in parenchymal echogenicity. Portal vein is patent on color Doppler imaging with normal direction of blood flow towards the liver. Other: Grossly negative visible  right kidney. IMPRESSION: Negative right upper quadrant ultrasound. Electronically Signed   By: Genevie Ann M.D.   On: 08/02/2020 09:05     Labs:   Basic Metabolic Panel: Recent Labs  Lab 08/02/20 0459  NA 137  K 4.3  CL 98  CO2 28  GLUCOSE 142*  BUN 21  CREATININE 1.33*  CALCIUM 9.9   GFR Estimated Creatinine Clearance: 60.9 mL/min (A) (by C-G formula based on SCr of 1.33 mg/dL (H)). Liver Function Tests: Recent Labs  Lab 08/02/20 0459  AST 26  ALT 21  ALKPHOS 65  BILITOT 0.5  PROT 7.2  ALBUMIN 4.0   Recent Labs  Lab 08/02/20 0459  LIPASE 54*   No results for input(s): AMMONIA in the last 168 hours. Coagulation profile Recent Labs  Lab 08/02/20 0459  INR 0.9    CBC: Recent Labs  Lab 08/02/20 0459 08/03/20 0524  WBC 9.8 8.7  NEUTROABS 5.7  --   HGB 14.3 13.9  HCT 45.4 44.0  MCV 82.1 82.4  PLT 299 276   Cardiac Enzymes: No results for input(s): CKTOTAL, CKMB, CKMBINDEX, TROPONINI in the last 168 hours. BNP: Invalid input(s): POCBNP CBG: Recent Labs  Lab 08/02/20 1623 08/02/20 2052 08/03/20 0846  GLUCAP 137* 112* 163*   D-Dimer No results for input(s): DDIMER in the last 72 hours. Hgb A1c Recent Labs    08/02/20 0923  HGBA1C 8.3*   Lipid Profile No results for input(s): CHOL, HDL, LDLCALC, TRIG, CHOLHDL, LDLDIRECT in the last 72  hours. Thyroid function studies No results for input(s): TSH, T4TOTAL, T3FREE, THYROIDAB in the last 72 hours.  Invalid input(s): FREET3 Anemia work up No results for input(s): VITAMINB12, FOLATE, FERRITIN, TIBC, IRON, RETICCTPCT in the last 72 hours. Microbiology Recent Results (from the past 240 hour(s))  Resp Panel by RT-PCR (Flu A&B, Covid) Nasopharyngeal Swab     Status: None   Collection Time: 08/02/20  5:09 AM   Specimen: Nasopharyngeal Swab; Nasopharyngeal(NP) swabs in vial transport medium  Result Value Ref Range Status   SARS Coronavirus 2 by RT PCR NEGATIVE NEGATIVE Final    Comment: (NOTE) SARS-CoV-2 target nucleic acids are NOT DETECTED.  The SARS-CoV-2 RNA is generally detectable in upper respiratory specimens during the acute phase of infection. The lowest concentration of SARS-CoV-2 viral copies this assay can detect is 138 copies/mL. A negative result does not preclude SARS-Cov-2 infection and should not be used as the sole basis for treatment or other patient management decisions. A negative result may occur with  improper specimen collection/handling, submission of specimen other than nasopharyngeal swab, presence of viral mutation(s) within the areas targeted by this assay, and inadequate number of viral copies(<138 copies/mL). A negative result must be combined with clinical observations, patient history, and epidemiological information. The expected result is Negative.  Fact Sheet for Patients:  EntrepreneurPulse.com.au  Fact Sheet for Healthcare Providers:  IncredibleEmployment.be  This test is no t yet approved or cleared by the [place] FDA and  has been authorized for detection and/or diagnosis of SARS-CoV-2 by FDA under an Emergency Use Authorization (EUA). This EUA will remain  in effect (meaning this test can be used) for the duration of the COVID-19 declaration under Section 564(b)(1) of the Act,  21 U.S.C.section 360bbb-3(b)(1), unless the authorization is terminated  or revoked sooner.       Influenza A by PCR NEGATIVE NEGATIVE Final   Influenza B by PCR NEGATIVE NEGATIVE Final    Comment: (NOTE) The Xpert Xpress SARS-CoV-2/FLU/RSV  plus assay is intended as an aid in the diagnosis of influenza from Nasopharyngeal swab specimens and should not be used as a sole basis for treatment. Nasal washings and aspirates are unacceptable for Xpert Xpress SARS-CoV-2/FLU/RSV testing.  Fact Sheet for Patients: EntrepreneurPulse.com.au  Fact Sheet for Healthcare Providers: IncredibleEmployment.be  This test is not yet approved or cleared by the [place] FDA and has been authorized for detection and/or diagnosis of SARS-CoV-2 by FDA under an Emergency Use Authorization (EUA). This EUA will remain in effect (meaning this test can be used) for the duration of the COVID-19 declaration under Section 564(b)(1) of the Act, 21 U.S.C. section 360bbb-3(b)(1), unless the authorization is terminated or revoked.  Performed at [facility], [address] [place], [place] [address]      Discharge Instructions:   Discharge Instructions    Diet - low sodium heart healthy   Complete by: As directed    Diet Carb Modified   Complete by: As directed    Increase activity slowly   Complete by: As directed      Allergies as of 08/03/2020   No Known Allergies     Medication List    STOP taking these medications   acetaminophen 325 MG tablet Commonly known as: TYLENOL   albuterol 108 (90 Base) MCG/ACT inhaler Commonly known as: VENTOLIN HFA     TAKE these medications   aspirin 81 MG EC tablet Take 1 tablet (81 mg total) by mouth daily.   atorvastatin 80 MG tablet Commonly known as: LIPITOR Take 1 tablet (80 mg total) by mouth daily at 6 PM. What changed: when to take this   cetirizine 10 MG tablet Commonly known as: ZYRTEC Take  10 mg by mouth daily.   Combivent Respimat 20-100 MCG/ACT Aers respimat Generic drug: Ipratropium-Albuterol Inhale 1 puff into the lungs 4 (four) times daily.   fluticasone 50 MCG/ACT nasal spray Commonly known as: FLONASE Place 1 spray into both nostrils daily.   Fluticasone-Salmeterol 250-50 MCG/DOSE Aepb Commonly known as: ADVAIR Inhale 1 puff into the lungs 2 (two) times daily.   furosemide 20 MG tablet Commonly known as: LASIX Take 20 mg by mouth daily.   gabapentin 600 MG tablet Commonly known as: NEURONTIN Take 600 mg by mouth 4 (four) times daily.   insulin detemir 100 UNIT/ML injection Commonly known as: LEVEMIR Inject 0.93 mLs (93 Units total) into the skin 2 (two) times daily.   irbesartan 150 MG tablet Commonly known as: AVAPRO Take 150 mg by mouth daily.   metoprolol tartrate 25 MG tablet Commonly known as: LOPRESSOR Take 1 tablet (25 mg total) by mouth 2 (two) times daily.   montelukast 10 MG tablet Commonly known as: SINGULAIR Take 10 mg by mouth at bedtime.   multivitamin with minerals Tabs tablet Take 1 tablet by mouth daily.   nitroGLYCERIN 0.4 MG SL tablet Commonly known as: NITROSTAT PLACE 1 TABLET UNDER TONGUE EVERY 5 MIN AS NEEDED FOR CHEST PAIN IF NO RELIEF IN15 MIN CALL 911 (MAX 3 TABS) What changed: See the new instructions.   sitaGLIPtin-metformin 50-1000 MG tablet Commonly known as: JANUMET Take 1 tablet by mouth 2 (two) times daily with a meal.   tamsulosin 0.4 MG Caps capsule Commonly known as: FLOMAX Take 0.4 mg by mouth daily.         Time coordinating discharge: 29 minutes  Signed:  Rosita Guzzetta  [facility] 08/03/2020, 9:39 AM   Pager on [email or web address]. If 7PM-7AM, please contact night-coverage  at [email or web address]

## 2020-08-08 DIAGNOSIS — M19041 Primary osteoarthritis, right hand: Secondary | ICD-10-CM | POA: Diagnosis not present

## 2020-08-08 DIAGNOSIS — M19042 Primary osteoarthritis, left hand: Secondary | ICD-10-CM | POA: Diagnosis not present

## 2020-08-08 DIAGNOSIS — M5030 Other cervical disc degeneration, unspecified cervical region: Secondary | ICD-10-CM | POA: Diagnosis not present

## 2020-08-08 DIAGNOSIS — M79642 Pain in left hand: Secondary | ICD-10-CM | POA: Diagnosis not present

## 2020-08-08 DIAGNOSIS — M79641 Pain in right hand: Secondary | ICD-10-CM | POA: Diagnosis not present

## 2020-08-14 DIAGNOSIS — M47812 Spondylosis without myelopathy or radiculopathy, cervical region: Secondary | ICD-10-CM | POA: Diagnosis not present

## 2020-08-14 DIAGNOSIS — M5030 Other cervical disc degeneration, unspecified cervical region: Secondary | ICD-10-CM | POA: Diagnosis not present

## 2020-08-29 DIAGNOSIS — M6281 Muscle weakness (generalized): Secondary | ICD-10-CM | POA: Diagnosis not present

## 2020-08-29 DIAGNOSIS — M47812 Spondylosis without myelopathy or radiculopathy, cervical region: Secondary | ICD-10-CM | POA: Diagnosis not present

## 2020-09-05 ENCOUNTER — Other Ambulatory Visit: Payer: Self-pay

## 2020-09-08 DIAGNOSIS — M47812 Spondylosis without myelopathy or radiculopathy, cervical region: Secondary | ICD-10-CM | POA: Diagnosis not present

## 2020-09-08 DIAGNOSIS — M6281 Muscle weakness (generalized): Secondary | ICD-10-CM | POA: Diagnosis not present

## 2020-09-15 DIAGNOSIS — M47812 Spondylosis without myelopathy or radiculopathy, cervical region: Secondary | ICD-10-CM | POA: Diagnosis not present

## 2020-09-15 DIAGNOSIS — M6281 Muscle weakness (generalized): Secondary | ICD-10-CM | POA: Diagnosis not present

## 2020-09-19 DIAGNOSIS — G479 Sleep disorder, unspecified: Secondary | ICD-10-CM | POA: Diagnosis not present

## 2020-09-22 DIAGNOSIS — M6281 Muscle weakness (generalized): Secondary | ICD-10-CM | POA: Diagnosis not present

## 2020-09-22 DIAGNOSIS — M47812 Spondylosis without myelopathy or radiculopathy, cervical region: Secondary | ICD-10-CM | POA: Diagnosis not present

## 2020-09-25 DIAGNOSIS — E119 Type 2 diabetes mellitus without complications: Secondary | ICD-10-CM | POA: Diagnosis not present

## 2020-10-02 DIAGNOSIS — M47812 Spondylosis without myelopathy or radiculopathy, cervical region: Secondary | ICD-10-CM | POA: Diagnosis not present

## 2020-10-02 DIAGNOSIS — M5030 Other cervical disc degeneration, unspecified cervical region: Secondary | ICD-10-CM | POA: Diagnosis not present

## 2020-10-05 DIAGNOSIS — M47812 Spondylosis without myelopathy or radiculopathy, cervical region: Secondary | ICD-10-CM | POA: Diagnosis not present

## 2020-10-05 DIAGNOSIS — M6281 Muscle weakness (generalized): Secondary | ICD-10-CM | POA: Diagnosis not present

## 2020-10-11 DIAGNOSIS — R200 Anesthesia of skin: Secondary | ICD-10-CM | POA: Diagnosis not present

## 2020-10-11 DIAGNOSIS — M79671 Pain in right foot: Secondary | ICD-10-CM | POA: Diagnosis not present

## 2020-10-11 DIAGNOSIS — G479 Sleep disorder, unspecified: Secondary | ICD-10-CM | POA: Diagnosis not present

## 2020-10-11 DIAGNOSIS — R202 Paresthesia of skin: Secondary | ICD-10-CM | POA: Diagnosis not present

## 2020-10-11 DIAGNOSIS — M25569 Pain in unspecified knee: Secondary | ICD-10-CM | POA: Diagnosis not present

## 2020-10-11 DIAGNOSIS — M79672 Pain in left foot: Secondary | ICD-10-CM | POA: Diagnosis not present

## 2020-10-18 DIAGNOSIS — M6281 Muscle weakness (generalized): Secondary | ICD-10-CM | POA: Diagnosis not present

## 2020-10-18 DIAGNOSIS — M47812 Spondylosis without myelopathy or radiculopathy, cervical region: Secondary | ICD-10-CM | POA: Diagnosis not present

## 2020-10-20 ENCOUNTER — Encounter: Payer: Self-pay | Admitting: Intensive Care

## 2020-10-20 ENCOUNTER — Emergency Department: Payer: Medicare HMO

## 2020-10-20 ENCOUNTER — Other Ambulatory Visit: Payer: Self-pay

## 2020-10-20 DIAGNOSIS — I2510 Atherosclerotic heart disease of native coronary artery without angina pectoris: Secondary | ICD-10-CM | POA: Insufficient documentation

## 2020-10-20 DIAGNOSIS — Z7984 Long term (current) use of oral hypoglycemic drugs: Secondary | ICD-10-CM | POA: Insufficient documentation

## 2020-10-20 DIAGNOSIS — E1122 Type 2 diabetes mellitus with diabetic chronic kidney disease: Secondary | ICD-10-CM | POA: Diagnosis not present

## 2020-10-20 DIAGNOSIS — Z950 Presence of cardiac pacemaker: Secondary | ICD-10-CM | POA: Diagnosis not present

## 2020-10-20 DIAGNOSIS — Z79899 Other long term (current) drug therapy: Secondary | ICD-10-CM | POA: Diagnosis not present

## 2020-10-20 DIAGNOSIS — Z8679 Personal history of other diseases of the circulatory system: Secondary | ICD-10-CM | POA: Diagnosis not present

## 2020-10-20 DIAGNOSIS — I129 Hypertensive chronic kidney disease with stage 1 through stage 4 chronic kidney disease, or unspecified chronic kidney disease: Secondary | ICD-10-CM | POA: Insufficient documentation

## 2020-10-20 DIAGNOSIS — Z794 Long term (current) use of insulin: Secondary | ICD-10-CM | POA: Diagnosis not present

## 2020-10-20 DIAGNOSIS — E1140 Type 2 diabetes mellitus with diabetic neuropathy, unspecified: Secondary | ICD-10-CM | POA: Insufficient documentation

## 2020-10-20 DIAGNOSIS — R079 Chest pain, unspecified: Secondary | ICD-10-CM | POA: Diagnosis not present

## 2020-10-20 DIAGNOSIS — Z7982 Long term (current) use of aspirin: Secondary | ICD-10-CM | POA: Insufficient documentation

## 2020-10-20 DIAGNOSIS — N182 Chronic kidney disease, stage 2 (mild): Secondary | ICD-10-CM | POA: Diagnosis not present

## 2020-10-20 DIAGNOSIS — J449 Chronic obstructive pulmonary disease, unspecified: Secondary | ICD-10-CM | POA: Insufficient documentation

## 2020-10-20 DIAGNOSIS — Z85828 Personal history of other malignant neoplasm of skin: Secondary | ICD-10-CM | POA: Insufficient documentation

## 2020-10-20 DIAGNOSIS — R0789 Other chest pain: Secondary | ICD-10-CM | POA: Diagnosis not present

## 2020-10-20 DIAGNOSIS — Z87891 Personal history of nicotine dependence: Secondary | ICD-10-CM | POA: Diagnosis not present

## 2020-10-20 LAB — TROPONIN I (HIGH SENSITIVITY): Troponin I (High Sensitivity): 6 ng/L (ref <18)

## 2020-10-20 LAB — BASIC METABOLIC PANEL
Anion gap: 10 (ref 5–15)
BUN: 20 mg/dL (ref 8–23)
CO2: 26 mmol/L (ref 22–32)
Calcium: 10.1 mg/dL (ref 8.9–10.3)
Chloride: 100 mmol/L (ref 98–111)
Creatinine, Ser: 1.31 mg/dL — ABNORMAL HIGH (ref 0.61–1.24)
GFR, Estimated: 57 mL/min — ABNORMAL LOW (ref >60)
Glucose, Bld: 136 mg/dL — ABNORMAL HIGH (ref 70–99)
Potassium: 4.4 mmol/L (ref 3.5–5.1)
Sodium: 136 mmol/L (ref 135–145)

## 2020-10-20 LAB — CBC
HCT: 47.6 % (ref 39.0–52.0)
Hemoglobin: 15.6 g/dL (ref 13.0–17.0)
MCH: 27.1 pg (ref 26.0–34.0)
MCHC: 32.8 g/dL (ref 30.0–36.0)
MCV: 82.6 fL (ref 80.0–100.0)
Platelets: 308 10*3/uL (ref 150–400)
RBC: 5.76 MIL/uL (ref 4.22–5.81)
RDW: 18.8 % — ABNORMAL HIGH (ref 11.5–15.5)
WBC: 8.2 10*3/uL (ref 4.0–10.5)
nRBC: 0 % (ref 0.0–0.2)

## 2020-10-20 NOTE — ED Triage Notes (Signed)
Patient c/o left sided chest pain that started yesterday. Denies radiation. Sees a cardiologist. Has not taken his prescribed nitro

## 2020-10-21 ENCOUNTER — Emergency Department
Admission: EM | Admit: 2020-10-21 | Discharge: 2020-10-21 | Disposition: A | Discharge status: Home / Self Care | Payer: Medicare HMO | Attending: Emergency Medicine | Admitting: Emergency Medicine

## 2020-10-21 DIAGNOSIS — R079 Chest pain, unspecified: Secondary | ICD-10-CM

## 2020-10-21 LAB — TROPONIN I (HIGH SENSITIVITY): Troponin I (High Sensitivity): 6 ng/L (ref <18)

## 2020-10-21 NOTE — ED Provider Notes (Signed)
[facility] Emergency Department Provider Note  ____________________________________________   Event Date/Time   First MD Initiated Contact with Patient 10/21/20 0141     (approximate)  I have reviewed the triage vital signs and the nursing notes.   HISTORY  Chief Complaint Chest Pain    HPI Henry [name] is a 75 y.o. male  with PMH as listed below including an admission to [facility] (by me) about 3 months ago for unstable angina.  He had a reassuring nuclear medicine stress test and is being managed as an outpatient.  He presents tonight for left sided chest pain that happened a few times over the last 24 hours.  He does not have chest pain every day so he wanted to make sure everything is okay, but he has not had chest pain since coming to the emergency department more than 9 hours ago.  He denies shortness of breath.  He said that his oxygen level always stays at about 90 to 91% but it goes up if he takes deep breaths.  He denies fever, sore throat, shortness of breath, nausea, vomiting, abdominal pain, and dysuria.  Nothing in particular makes his symptoms better or worse.  He did not try taking the nitroglycerin he has at home.  He said the pain was moderate, sharp and stabbing, but now feels better.     Past Medical History:  Diagnosis Date   Anginal pain ([place])    Arthritis    CAD (coronary artery disease)    a. anterior stemi 08/10/2017; b. LHC 08/10/17: pLAD 70, p-mLAD 99 thrombotic, ostD1 70, p-mLCx 30, pRCA-1 50 calcified, pRCA-2 99 calcified, dRCA 30. Successful PCI/DES to p-mLAD x 2   Cancer (HCC)    skin   CKD (chronic kidney disease), stage II    COPD (chronic obstructive pulmonary disease) (HCC)    Glaucoma    History of kidney stones    Hyperlipidemia    Hypertension    Insulin dependent diabetes mellitus    Ischemic cardiomyopathy    a. TTE 08/10/17: technically difficult study, no diagnostic RWMA, EF 40-50%, poor windows, very difficult  study    Myocardial infarction [facility])    Neuropathy    S/P angioplasty with stent 08/10/17 emergently to LAD ,  08/13/17 atherectomy and stent to pRCA  08/14/2017    Patient Active Problem List   Diagnosis Date Noted   Chest pain 08/02/2020   Unstable angina ([place]) 08/02/2020   Dyspnea on exertion 12/25/2019   Acute respiratory failure with hypoxia ([place]) 12/25/2019   Hypoxia 12/24/2019   S/P angioplasty with stent 08/10/17 emergently to LAD ,  08/13/17 atherectomy and stent to pRCA  08/14/2017   CAD in native artery 08/12/2017   Leukocytosis 08/12/2017   Insulin dependent diabetes mellitus 08/12/2017   NSTEMI (non-ST elevated myocardial infarction) ([place]) 08/12/2017   Acute ST elevation myocardial infarction (STEMI) involving left anterior descending (LAD) coronary artery ([place]) 08/10/2017   ST elevation myocardial infarction involving left anterior descending (LAD) coronary artery (HCC)    COPD (chronic obstructive pulmonary disease) ([place]) 06/02/2017   CAP (community acquired pneumonia) 01/25/2016   Diabetes ([place]) 01/25/2016   HTN (hypertension) 01/25/2016   HLD (hyperlipidemia) 01/25/2016   Benign essential hypertension 07/12/2013    Past Surgical History:  Procedure Laterality Date   CARDIAC CATHETERIZATION     COLONOSCOPY WITH PROPOFOL N/A 12/08/2014   Procedure: COLONOSCOPY WITH PROPOFOL;  Surgeon: Manya Silvas, MD;  Location: [place];  Service: Endoscopy;  Laterality: N/A;   COLONOSCOPY WITH PROPOFOL N/A 01/10/2016   Procedure: COLONOSCOPY WITH PROPOFOL;  Surgeon: Manya Silvas, MD;  Location: [facility] ENDOSCOPY;  Service: Endoscopy;  Laterality: N/A;   COLONOSCOPY WITH PROPOFOL N/A 04/28/2019   Procedure: COLONOSCOPY WITH PROPOFOL;  Surgeon: Robert Bellow, MD;  Location: [facility] ENDOSCOPY;  Service: Endoscopy;  Laterality: N/A;   COLONOSCOPY WITH PROPOFOL N/A 03/17/2020   Procedure: COLONOSCOPY WITH PROPOFOL;  Surgeon: Robert Bellow, MD;  Location: [facility] ENDOSCOPY;   Service: Endoscopy;  Laterality: N/A;   CORONARY ATHERECTOMY N/A 08/13/2017   Procedure: CORONARY ATHERECTOMY;  Surgeon: Wellington Hampshire, MD;  Location: [place] CV LAB;  Service: Cardiovascular;  Laterality: N/A;   CORONARY STENT INTERVENTION N/A 08/13/2017   Procedure: CORONARY STENT INTERVENTION;  Surgeon: Wellington Hampshire, MD;  Location: [place] CV LAB;  Service: Cardiovascular;  Laterality: N/A;   CORONARY/GRAFT ACUTE MI REVASCULARIZATION N/A 08/10/2017   Procedure: Coronary/Graft Acute MI Revascularization;  Surgeon: Burnell Blanks, MD;  Location: [place] CV LAB;  Service: Cardiovascular;  Laterality: N/A;   EXTRACORPOREAL SHOCK WAVE LITHOTRIPSY Right 11/09/2015   Procedure: EXTRACORPOREAL SHOCK WAVE LITHOTRIPSY (ESWL);  Surgeon: Royston Cowper, MD;  Location: [facility] ORS;  Service: Urology;  Laterality: Right;   EXTRACORPOREAL SHOCK WAVE LITHOTRIPSY Left 10/17/2016   Procedure: EXTRACORPOREAL SHOCK WAVE LITHOTRIPSY (ESWL);  Surgeon: Royston Cowper, MD;  Location: [facility] ORS;  Service: Urology;  Laterality: Left;   LEFT HEART CATH AND CORONARY ANGIOGRAPHY N/A 08/10/2017   Procedure: LEFT HEART CATH AND CORONARY ANGIOGRAPHY;  Surgeon: Burnell Blanks, MD;  Location: [place] CV LAB;  Service: Cardiovascular;  Laterality: N/A;   LITHOTRIPSY     x 6   PAROTIDECTOMY  2015   TEMPORARY PACEMAKER N/A 08/13/2017   Procedure: TEMPORARY PACEMAKER;  Surgeon: Wellington Hampshire, MD;  Location: [place] CV LAB;  Service: Cardiovascular;  Laterality: N/A;   TONSILLECTOMY      Prior to Admission medications   Medication Sig Start Date End Date Taking? Authorizing Provider  aspirin EC 81 MG EC tablet Take 1 tablet (81 mg total) by mouth daily. 08/15/17   Isaiah Serge, NP  atorvastatin (LIPITOR) 80 MG tablet Take 1 tablet (80 mg total) by mouth daily at 6 PM. Patient taking differently: Take 80 mg by mouth daily. 08/14/17   Isaiah Serge, NP  cetirizine (ZYRTEC) 10 MG tablet  Take 10 mg by mouth daily.    [provider]  COMBIVENT RESPIMAT 20-100 MCG/ACT AERS respimat Inhale 1 puff into the lungs 4 (four) times daily. 07/22/20   [provider]  fluticasone (FLONASE) 50 MCG/ACT nasal spray Place 1 spray into both nostrils daily.    [provider]  Fluticasone-Salmeterol (ADVAIR) 250-50 MCG/DOSE AEPB Inhale 1 puff into the lungs 2 (two) times daily. Patient not taking: Reported on 08/02/2020    [provider]  furosemide (LASIX) 20 MG tablet Take 20 mg by mouth daily. 12/06/19   [provider]  gabapentin (NEURONTIN) 600 MG tablet Take 600 mg by mouth 4 (four) times daily.     [provider]  insulin detemir (LEVEMIR) 100 UNIT/ML injection Inject 0.93 mLs (93 Units total) into the skin 2 (two) times daily. 08/03/20   Jennye Boroughs, MD  irbesartan (AVAPRO) 150 MG tablet Take 150 mg by mouth daily. 11/22/19   [provider]  metoprolol tartrate (LOPRESSOR) 25 MG tablet Take 1 tablet (25 mg total) by mouth 2 (two) times daily.  08/14/17   Isaiah Serge, NP  montelukast (SINGULAIR) 10 MG tablet Take 10 mg by mouth at bedtime.  07/10/17   [provider]  Multiple Vitamin (MULTIVITAMIN WITH MINERALS) TABS tablet Take 1 tablet by mouth daily.    [provider]  nitroGLYCERIN (NITROSTAT) 0.4 MG SL tablet PLACE 1 TABLET UNDER TONGUE EVERY 5 MIN AS NEEDED FOR CHEST PAIN IF NO RELIEF IN15 MIN CALL 911 (MAX 3 TABS) Patient taking differently: Place 0.4 mg under the tongue every 5 (five) minutes as needed for chest pain. IF NO RELIEF IN15 MIN CALL 911 (MAX 3 TABS) 06/29/19   Isaiah Serge, NP  sitaGLIPtin-metformin (JANUMET) 50-1000 MG tablet Take 1 tablet by mouth 2 (two) times daily with a meal.    [provider]  tamsulosin (FLOMAX) 0.4 MG CAPS capsule Take 0.4 mg by mouth daily. 07/07/20   [provider]    Allergies Patient has no known allergies.  Family History  Problem  Relation Age of Onset   Heart attack Mother    Melanoma Father    Parkinsonism Father     Social History Social History   Tobacco Use   Smoking status: Former    Packs/day: 1.00    Years: 50.00    Pack years: 50.00    Types: Cigarettes    Quit date: 08/2017    Years since quitting: 3.2   Smokeless tobacco: Never   Tobacco comments:    Has Quit as of May 2019- reviewed relaspe and will continue to followup  Vaping Use   Vaping Use: Never used  Substance Use Topics   Alcohol use: No   Drug use: No    Review of Systems Constitutional: No fever/chills Eyes: No visual changes. ENT: No sore throat. Cardiovascular: Positive for chest pain. Respiratory: Denies shortness of breath. Gastrointestinal: No abdominal pain.  No nausea, no vomiting.  No diarrhea.  No constipation. Genitourinary: Negative for dysuria. Musculoskeletal: Positive for chronic neck pain.  Negative for back pain. Integumentary: Negative for rash. Neurological: Negative for headaches, focal weakness or numbness.   ____________________________________________   PHYSICAL EXAM:  VITAL SIGNS: ED Triage Vitals  Enc Vitals Group     BP 10/20/20 1717 134/74     Pulse Rate 10/20/20 1717 80     Resp 10/20/20 1717 20     Temp 10/20/20 1717 98.5 F (36.9 C)     Temp Source 10/20/20 1717 Oral     SpO2 10/20/20 1717 93 %     Weight 10/20/20 1717 104.3 kg (230 lb)     Height 10/20/20 1717 1.829 m (6')     Head Circumference --      Peak Flow --      Pain Score 10/20/20 1732 5     Pain Loc --      Pain Edu? --      Excl. in [place]? --     Constitutional: Alert and oriented.  Eyes: Conjunctivae are normal.  Head: Atraumatic. Nose: No congestion/rhinnorhea. Mouth/Throat: Patient is wearing a mask. Neck: No stridor.  No meningeal signs.   Cardiovascular: Normal rate, regular rhythm. Good peripheral circulation. Respiratory: Normal respiratory effort.  No retractions. Gastrointestinal: Soft and nontender.  No distention.  Musculoskeletal: No lower extremity tenderness nor edema. No gross deformities of extremities. Neurologic:  Normal speech and language. No gross focal neurologic deficits are appreciated.  Skin:  Skin is warm, dry and intact. Psychiatric: Mood and affect are normal. Speech and behavior are normal.  ____________________________________________   LABS (all labs ordered are listed, but only abnormal results are displayed)  Labs Reviewed  BASIC METABOLIC PANEL - Abnormal; Notable for the following components:      Result Value   Glucose, Bld 136 (*)    Creatinine, Ser 1.31 (*)    GFR, Estimated 57 (*)    All other components within normal limits  CBC - Abnormal; Notable for the following components:   RDW 18.8 (*)    All other components within normal limits  TROPONIN I (HIGH SENSITIVITY)  TROPONIN I (HIGH SENSITIVITY)   ____________________________________________  EKG  ED ECG REPORT I, Hinda Kehr, the attending physician, personally viewed and interpreted this ECG.  Date: 10/20/2020 EKG Time: 17: 21 Rate: 86 Rhythm: sinus rhythm with premature atrial complexes QRS Axis: normal Intervals: normal ST/T Wave abnormalities: Non-specific ST segment / T-wave changes, but no clear evidence of acute ischemia. Narrative Interpretation: no definitive evidence of acute ischemia; does not meet STEMI criteria.  ____________________________________________  RADIOLOGY I, Hinda Kehr, personally viewed and evaluated these images (plain radiographs) as part of my medical decision making, as well as reviewing the written report by the radiologist.  ED MD interpretation:  No acute abnormalities  Official radiology report(s): DG Chest 2 View  Result Date: 10/20/2020 CLINICAL DATA:  Chest pain EXAM: CHEST - 2 VIEW COMPARISON:  08/03/2018, CT chest 12/24/2019 non the 8 on FINDINGS: The chronic scarring in the lower lungs. No acute airspace disease or effusion. Stable  cardiomediastinal silhouette. No pneumothorax. IMPRESSION: No active cardiopulmonary disease. Stable scarring in the lower lungs. Electronically Signed   By: Donavan Foil M.D.   On: 10/20/2020 18:28    ____________________________________________   PROCEDURES   Procedure(s) performed (including Critical Care):  .1-3 Lead EKG Interpretation  Date/Time: 10/21/2020 2:32 AM Performed by: Hinda Kehr, MD Authorized by: Hinda Kehr, MD     Interpretation: normal     ECG rate:  78   ECG rate assessment: normal     Rhythm: sinus rhythm     Ectopy: PAC     Conduction: normal     ____________________________________________   INITIAL IMPRESSION / MDM / ASSESSMENT AND PLAN / ED COURSE  As part of my medical decision making, I reviewed the following data within the [place] notes reviewed and incorporated, Labs reviewed , EKG interpreted , Old chart reviewed, Radiograph reviewed , and Notes from prior ED visits   Differential diagnosis includes, but is not limited to, angina, pneumonia, ACS, PE.  The patient was on the cardiac monitor to evaluate for evidence of arrhythmia and/or significant heart rate changes.  Patient had transient symptoms with a degree of known coronary artery disease but also relatively reassuring recent cardiology work-up.  He also said he has an appointment next week with Dr. Saralyn Pilar.  He said he is feeling great now wants to go home.  EKG shows no sign of ischemia.  I personally reviewed the patient's imaging and agree with the radiologist's interpretation that there is no evidence of any acute abnormality.  Vital signs are normal other than some hypertension.  Two high-sensitivity troponins are negative.  CBC and BMP are stable including some chronic kidney disease.  Physical exam is reassuring.  His oxygen level tends to stay around 90% but when he takes a deep breath it goes up to the mid-90s and he has no dyspnea.  He is very eager  to go home and I encouraged him to follow-up with  Dr. Saralyn Pilar as scheduled.  I gave my usual and customary return precautions.         ____________________________________________  FINAL CLINICAL IMPRESSION(S) / ED DIAGNOSES  Final diagnoses:  Chest pain, unspecified type     MEDICATIONS GIVEN DURING THIS VISIT:  Medications - No data to display   ED Discharge Orders     None        Note:  This document was prepared using Dragon voice recognition software and may include unintentional dictation errors.   Hinda Kehr, MD 10/21/20 [phone]

## 2020-10-21 NOTE — Discharge Instructions (Addendum)

## 2020-10-25 DIAGNOSIS — M47812 Spondylosis without myelopathy or radiculopathy, cervical region: Secondary | ICD-10-CM | POA: Diagnosis not present

## 2020-10-25 DIAGNOSIS — M6281 Muscle weakness (generalized): Secondary | ICD-10-CM | POA: Diagnosis not present

## 2020-11-01 DIAGNOSIS — M6281 Muscle weakness (generalized): Secondary | ICD-10-CM | POA: Diagnosis not present

## 2020-11-01 DIAGNOSIS — M47812 Spondylosis without myelopathy or radiculopathy, cervical region: Secondary | ICD-10-CM | POA: Diagnosis not present

## 2020-11-02 DIAGNOSIS — I10 Essential (primary) hypertension: Secondary | ICD-10-CM | POA: Diagnosis not present

## 2020-11-02 DIAGNOSIS — I2109 ST elevation (STEMI) myocardial infarction involving other coronary artery of anterior wall: Secondary | ICD-10-CM | POA: Diagnosis not present

## 2020-11-02 DIAGNOSIS — R002 Palpitations: Secondary | ICD-10-CM | POA: Diagnosis not present

## 2020-11-02 DIAGNOSIS — Z87891 Personal history of nicotine dependence: Secondary | ICD-10-CM | POA: Diagnosis not present

## 2020-11-02 DIAGNOSIS — I25118 Atherosclerotic heart disease of native coronary artery with other forms of angina pectoris: Secondary | ICD-10-CM | POA: Diagnosis not present

## 2020-11-02 DIAGNOSIS — Z794 Long term (current) use of insulin: Secondary | ICD-10-CM | POA: Diagnosis not present

## 2020-11-02 DIAGNOSIS — E1129 Type 2 diabetes mellitus with other diabetic kidney complication: Secondary | ICD-10-CM | POA: Diagnosis not present

## 2020-11-02 DIAGNOSIS — I491 Atrial premature depolarization: Secondary | ICD-10-CM | POA: Diagnosis not present

## 2020-11-02 DIAGNOSIS — J449 Chronic obstructive pulmonary disease, unspecified: Secondary | ICD-10-CM | POA: Diagnosis not present

## 2020-11-14 DIAGNOSIS — J449 Chronic obstructive pulmonary disease, unspecified: Secondary | ICD-10-CM | POA: Diagnosis not present

## 2020-11-14 DIAGNOSIS — Z794 Long term (current) use of insulin: Secondary | ICD-10-CM | POA: Diagnosis not present

## 2020-11-14 DIAGNOSIS — R062 Wheezing: Secondary | ICD-10-CM | POA: Diagnosis not present

## 2020-11-14 DIAGNOSIS — E119 Type 2 diabetes mellitus without complications: Secondary | ICD-10-CM | POA: Diagnosis not present

## 2020-11-14 DIAGNOSIS — R0602 Shortness of breath: Secondary | ICD-10-CM | POA: Diagnosis not present

## 2020-12-08 DIAGNOSIS — M6281 Muscle weakness (generalized): Secondary | ICD-10-CM | POA: Diagnosis not present

## 2020-12-08 DIAGNOSIS — M47812 Spondylosis without myelopathy or radiculopathy, cervical region: Secondary | ICD-10-CM | POA: Diagnosis not present

## 2021-02-05 DIAGNOSIS — G5603 Carpal tunnel syndrome, bilateral upper limbs: Secondary | ICD-10-CM | POA: Diagnosis not present

## 2021-02-05 DIAGNOSIS — M79672 Pain in left foot: Secondary | ICD-10-CM | POA: Diagnosis not present

## 2021-02-05 DIAGNOSIS — G479 Sleep disorder, unspecified: Secondary | ICD-10-CM | POA: Diagnosis not present

## 2021-02-05 DIAGNOSIS — M79671 Pain in right foot: Secondary | ICD-10-CM | POA: Diagnosis not present

## 2021-02-05 DIAGNOSIS — R202 Paresthesia of skin: Secondary | ICD-10-CM | POA: Diagnosis not present

## 2021-02-05 DIAGNOSIS — R200 Anesthesia of skin: Secondary | ICD-10-CM | POA: Diagnosis not present

## 2021-02-05 DIAGNOSIS — M79604 Pain in right leg: Secondary | ICD-10-CM | POA: Diagnosis not present

## 2021-02-05 DIAGNOSIS — M79605 Pain in left leg: Secondary | ICD-10-CM | POA: Diagnosis not present

## 2021-02-08 DIAGNOSIS — M539 Dorsopathy, unspecified: Secondary | ICD-10-CM | POA: Diagnosis not present

## 2021-02-08 DIAGNOSIS — M654 Radial styloid tenosynovitis [de Quervain]: Secondary | ICD-10-CM | POA: Diagnosis not present

## 2021-02-08 DIAGNOSIS — M19042 Primary osteoarthritis, left hand: Secondary | ICD-10-CM | POA: Diagnosis not present

## 2021-02-08 DIAGNOSIS — M19041 Primary osteoarthritis, right hand: Secondary | ICD-10-CM | POA: Diagnosis not present

## 2021-02-09 DIAGNOSIS — R000 Tachycardia, unspecified: Secondary | ICD-10-CM | POA: Diagnosis not present

## 2021-02-09 DIAGNOSIS — J441 Chronic obstructive pulmonary disease with (acute) exacerbation: Secondary | ICD-10-CM | POA: Diagnosis not present

## 2021-02-10 ENCOUNTER — Encounter: Payer: Self-pay | Admitting: Emergency Medicine

## 2021-02-10 ENCOUNTER — Other Ambulatory Visit: Payer: Self-pay

## 2021-02-10 ENCOUNTER — Emergency Department: Payer: Medicare HMO

## 2021-02-10 DIAGNOSIS — E1122 Type 2 diabetes mellitus with diabetic chronic kidney disease: Secondary | ICD-10-CM | POA: Diagnosis not present

## 2021-02-10 DIAGNOSIS — J9601 Acute respiratory failure with hypoxia: Secondary | ICD-10-CM | POA: Diagnosis present

## 2021-02-10 DIAGNOSIS — Z6831 Body mass index (BMI) 31.0-31.9, adult: Secondary | ICD-10-CM | POA: Diagnosis not present

## 2021-02-10 DIAGNOSIS — Z85820 Personal history of malignant melanoma of skin: Secondary | ICD-10-CM | POA: Diagnosis not present

## 2021-02-10 DIAGNOSIS — J441 Chronic obstructive pulmonary disease with (acute) exacerbation: Secondary | ICD-10-CM | POA: Diagnosis not present

## 2021-02-10 DIAGNOSIS — I252 Old myocardial infarction: Secondary | ICD-10-CM

## 2021-02-10 DIAGNOSIS — Z955 Presence of coronary angioplasty implant and graft: Secondary | ICD-10-CM

## 2021-02-10 DIAGNOSIS — A419 Sepsis, unspecified organism: Secondary | ICD-10-CM | POA: Diagnosis not present

## 2021-02-10 DIAGNOSIS — I248 Other forms of acute ischemic heart disease: Secondary | ICD-10-CM | POA: Diagnosis present

## 2021-02-10 DIAGNOSIS — Z888 Allergy status to other drugs, medicaments and biological substances status: Secondary | ICD-10-CM | POA: Diagnosis not present

## 2021-02-10 DIAGNOSIS — Z20822 Contact with and (suspected) exposure to covid-19: Secondary | ICD-10-CM | POA: Diagnosis present

## 2021-02-10 DIAGNOSIS — R000 Tachycardia, unspecified: Secondary | ICD-10-CM | POA: Diagnosis not present

## 2021-02-10 DIAGNOSIS — Z79899 Other long term (current) drug therapy: Secondary | ICD-10-CM

## 2021-02-10 DIAGNOSIS — E669 Obesity, unspecified: Secondary | ICD-10-CM | POA: Diagnosis not present

## 2021-02-10 DIAGNOSIS — I129 Hypertensive chronic kidney disease with stage 1 through stage 4 chronic kidney disease, or unspecified chronic kidney disease: Secondary | ICD-10-CM | POA: Diagnosis not present

## 2021-02-10 DIAGNOSIS — Z87891 Personal history of nicotine dependence: Secondary | ICD-10-CM | POA: Diagnosis not present

## 2021-02-10 DIAGNOSIS — N183 Chronic kidney disease, stage 3 unspecified: Secondary | ICD-10-CM | POA: Diagnosis not present

## 2021-02-10 DIAGNOSIS — R059 Cough, unspecified: Secondary | ICD-10-CM | POA: Diagnosis not present

## 2021-02-10 DIAGNOSIS — R0789 Other chest pain: Secondary | ICD-10-CM | POA: Diagnosis not present

## 2021-02-10 DIAGNOSIS — Z794 Long term (current) use of insulin: Secondary | ICD-10-CM

## 2021-02-10 DIAGNOSIS — R0902 Hypoxemia: Secondary | ICD-10-CM | POA: Diagnosis not present

## 2021-02-10 DIAGNOSIS — N1831 Chronic kidney disease, stage 3a: Secondary | ICD-10-CM | POA: Diagnosis not present

## 2021-02-10 DIAGNOSIS — Z7982 Long term (current) use of aspirin: Secondary | ICD-10-CM | POA: Diagnosis not present

## 2021-02-10 DIAGNOSIS — I2510 Atherosclerotic heart disease of native coronary artery without angina pectoris: Secondary | ICD-10-CM | POA: Diagnosis present

## 2021-02-10 DIAGNOSIS — I491 Atrial premature depolarization: Secondary | ICD-10-CM | POA: Diagnosis not present

## 2021-02-10 DIAGNOSIS — Z820 Family history of epilepsy and other diseases of the nervous system: Secondary | ICD-10-CM | POA: Diagnosis not present

## 2021-02-10 DIAGNOSIS — E785 Hyperlipidemia, unspecified: Secondary | ICD-10-CM | POA: Diagnosis present

## 2021-02-10 DIAGNOSIS — Z8249 Family history of ischemic heart disease and other diseases of the circulatory system: Secondary | ICD-10-CM

## 2021-02-10 DIAGNOSIS — I10 Essential (primary) hypertension: Secondary | ICD-10-CM | POA: Diagnosis not present

## 2021-02-10 DIAGNOSIS — R079 Chest pain, unspecified: Secondary | ICD-10-CM | POA: Diagnosis not present

## 2021-02-10 DIAGNOSIS — N200 Calculus of kidney: Secondary | ICD-10-CM | POA: Diagnosis not present

## 2021-02-10 LAB — BASIC METABOLIC PANEL
Anion gap: 10 (ref 5–15)
BUN: 19 mg/dL (ref 8–23)
CO2: 27 mmol/L (ref 22–32)
Calcium: 10.2 mg/dL (ref 8.9–10.3)
Chloride: 100 mmol/L (ref 98–111)
Creatinine, Ser: 1.32 mg/dL — ABNORMAL HIGH (ref 0.61–1.24)
GFR, Estimated: 56 mL/min — ABNORMAL LOW (ref >60)
Glucose, Bld: 198 mg/dL — ABNORMAL HIGH (ref 70–99)
Potassium: 4.4 mmol/L (ref 3.5–5.1)
Sodium: 137 mmol/L (ref 135–145)

## 2021-02-10 LAB — CBC WITH DIFFERENTIAL/PLATELET
Abs Immature Granulocytes: 0.02 10*3/uL (ref 0.00–0.07)
Basophils Absolute: 0.1 10*3/uL (ref 0.0–0.1)
Basophils Relative: 1 %
Eosinophils Absolute: 0.3 10*3/uL (ref 0.0–0.5)
Eosinophils Relative: 4 %
HCT: 50.8 % (ref 39.0–52.0)
Hemoglobin: 16.2 g/dL (ref 13.0–17.0)
Immature Granulocytes: 0 %
Lymphocytes Relative: 24 %
Lymphs Abs: 1.8 10*3/uL (ref 0.7–4.0)
MCH: 28.1 pg (ref 26.0–34.0)
MCHC: 31.9 g/dL (ref 30.0–36.0)
MCV: 88 fL (ref 80.0–100.0)
Monocytes Absolute: 1 10*3/uL (ref 0.1–1.0)
Monocytes Relative: 13 %
Neutro Abs: 4.4 10*3/uL (ref 1.7–7.7)
Neutrophils Relative %: 58 %
Platelets: 253 10*3/uL (ref 150–400)
RBC: 5.77 MIL/uL (ref 4.22–5.81)
RDW: 16.2 % — ABNORMAL HIGH (ref 11.5–15.5)
WBC: 7.6 10*3/uL (ref 4.0–10.5)
nRBC: 0 % (ref 0.0–0.2)

## 2021-02-10 LAB — RESP PANEL BY RT-PCR (FLU A&B, COVID) ARPGX2
Influenza A by PCR: NEGATIVE
Influenza B by PCR: NEGATIVE
SARS Coronavirus 2 by RT PCR: NEGATIVE

## 2021-02-10 LAB — TROPONIN I (HIGH SENSITIVITY): Troponin I (High Sensitivity): 6 ng/L (ref <18)

## 2021-02-10 MED ORDER — IPRATROPIUM-ALBUTEROL 0.5-2.5 (3) MG/3ML IN SOLN
3.0000 mL | Freq: Once | RESPIRATORY_TRACT | Status: AC
  Administered 2021-02-10: 3 mL via RESPIRATORY_TRACT
  Filled 2021-02-10: qty 3

## 2021-02-10 NOTE — ED Provider Notes (Signed)
Emergency Medicine Provider Triage Evaluation Note  Ivey Cina Lema , a 75 y.o. male  was evaluated in triage.  Pt complains of chest pain in mid sternal region without radiation - Pt has hx of MI Pt also has a congested cough x1 week and hx of desat at home to the high 80s - On exam O2 sat 88-89% and lungs with wheezing in bilat lower lung fields .  Review of Systems  Positive: Chest pain, wheezing, low O2 sat Negative: Diaphoresis, N/V, radiation of pain into jaw/back/arm  Physical Exam  There were no vitals taken for this visit. Gen:   Awake, no distress   Resp:  Normal effort O2 sat 88-89% - Wheezing in bilat lower lobes - Congested cough  MSK:   Moves extremities without difficulty  Other:  Chest pain without radiation   Medical Decision Making  Medically screening exam initiated at 3:34 PM.  Appropriate orders placed.  Zyad A Copeman was informed that the remainder of the evaluation will be completed by another provider, this initial triage assessment does not replace that evaluation, and the importance of remaining in the ED until their evaluation is complete.  Pt to have routine CP labs and CXR, EKG Will place pt on O2 and order duoneb    Willaim Rayas, NP 02/10/21 1538    Nance Pear, MD 02/10/21 [phone]

## 2021-02-10 NOTE — ED Triage Notes (Signed)
Pt to ED via [facility] from home for chest pain, shortness of breath, and cough. Pt states that he has had a cough x 1 week. Pt woke up this morning with pain in his chest. Pt states that it does not feel like his previous MI. Pt reports that he checked his SpO2 this morning at home and it was 88-89%  pt states that he was able to get sats up to 92%. Pt does not wear oxygen at home. Pt is in NAD.

## 2021-02-10 NOTE — ED Triage Notes (Signed)
Pt in via EMS from home with c/o CP. Pt with hx of MI and 3 stents. Pt has been given 2 sprays of nitro and 324asa. 127/70, 95% RA, 88-11HR

## 2021-02-11 ENCOUNTER — Emergency Department: Payer: Medicare HMO

## 2021-02-11 ENCOUNTER — Inpatient Hospital Stay
Admission: EM | Admit: 2021-02-11 | Discharge: 2021-02-14 | DRG: 871 | Disposition: A | Discharge status: Home Health | Payer: Medicare HMO | Attending: Internal Medicine | Admitting: Internal Medicine

## 2021-02-11 DIAGNOSIS — I248 Other forms of acute ischemic heart disease: Secondary | ICD-10-CM | POA: Diagnosis present

## 2021-02-11 DIAGNOSIS — J9601 Acute respiratory failure with hypoxia: Secondary | ICD-10-CM | POA: Diagnosis present

## 2021-02-11 DIAGNOSIS — E669 Obesity, unspecified: Secondary | ICD-10-CM | POA: Diagnosis present

## 2021-02-11 DIAGNOSIS — E785 Hyperlipidemia, unspecified: Secondary | ICD-10-CM | POA: Diagnosis present

## 2021-02-11 DIAGNOSIS — Z20822 Contact with and (suspected) exposure to covid-19: Secondary | ICD-10-CM | POA: Diagnosis present

## 2021-02-11 DIAGNOSIS — Z87891 Personal history of nicotine dependence: Secondary | ICD-10-CM | POA: Diagnosis not present

## 2021-02-11 DIAGNOSIS — Z85820 Personal history of malignant melanoma of skin: Secondary | ICD-10-CM | POA: Diagnosis not present

## 2021-02-11 DIAGNOSIS — A419 Sepsis, unspecified organism: Secondary | ICD-10-CM | POA: Diagnosis present

## 2021-02-11 DIAGNOSIS — Z955 Presence of coronary angioplasty implant and graft: Secondary | ICD-10-CM | POA: Diagnosis not present

## 2021-02-11 DIAGNOSIS — Z6831 Body mass index (BMI) 31.0-31.9, adult: Secondary | ICD-10-CM | POA: Diagnosis not present

## 2021-02-11 DIAGNOSIS — Z820 Family history of epilepsy and other diseases of the nervous system: Secondary | ICD-10-CM | POA: Diagnosis not present

## 2021-02-11 DIAGNOSIS — J441 Chronic obstructive pulmonary disease with (acute) exacerbation: Secondary | ICD-10-CM | POA: Diagnosis present

## 2021-02-11 DIAGNOSIS — Z888 Allergy status to other drugs, medicaments and biological substances status: Secondary | ICD-10-CM | POA: Diagnosis not present

## 2021-02-11 DIAGNOSIS — Z794 Long term (current) use of insulin: Secondary | ICD-10-CM | POA: Diagnosis not present

## 2021-02-11 DIAGNOSIS — N1831 Chronic kidney disease, stage 3a: Secondary | ICD-10-CM | POA: Diagnosis not present

## 2021-02-11 DIAGNOSIS — N200 Calculus of kidney: Secondary | ICD-10-CM | POA: Diagnosis not present

## 2021-02-11 DIAGNOSIS — E1122 Type 2 diabetes mellitus with diabetic chronic kidney disease: Secondary | ICD-10-CM | POA: Diagnosis present

## 2021-02-11 DIAGNOSIS — N183 Chronic kidney disease, stage 3 unspecified: Secondary | ICD-10-CM | POA: Diagnosis present

## 2021-02-11 DIAGNOSIS — I2510 Atherosclerotic heart disease of native coronary artery without angina pectoris: Secondary | ICD-10-CM | POA: Diagnosis present

## 2021-02-11 DIAGNOSIS — I252 Old myocardial infarction: Secondary | ICD-10-CM | POA: Diagnosis not present

## 2021-02-11 DIAGNOSIS — I129 Hypertensive chronic kidney disease with stage 1 through stage 4 chronic kidney disease, or unspecified chronic kidney disease: Secondary | ICD-10-CM | POA: Diagnosis present

## 2021-02-11 DIAGNOSIS — R6520 Severe sepsis without septic shock: Secondary | ICD-10-CM | POA: Diagnosis present

## 2021-02-11 DIAGNOSIS — Z7982 Long term (current) use of aspirin: Secondary | ICD-10-CM | POA: Diagnosis not present

## 2021-02-11 DIAGNOSIS — Z79899 Other long term (current) drug therapy: Secondary | ICD-10-CM | POA: Diagnosis not present

## 2021-02-11 DIAGNOSIS — I10 Essential (primary) hypertension: Secondary | ICD-10-CM | POA: Diagnosis present

## 2021-02-11 DIAGNOSIS — J449 Chronic obstructive pulmonary disease, unspecified: Secondary | ICD-10-CM

## 2021-02-11 DIAGNOSIS — Z8249 Family history of ischemic heart disease and other diseases of the circulatory system: Secondary | ICD-10-CM | POA: Diagnosis not present

## 2021-02-11 DIAGNOSIS — R079 Chest pain, unspecified: Secondary | ICD-10-CM | POA: Diagnosis not present

## 2021-02-11 DIAGNOSIS — R059 Cough, unspecified: Secondary | ICD-10-CM | POA: Diagnosis not present

## 2021-02-11 LAB — HEMOGLOBIN A1C
Hgb A1c MFr Bld: 7.9 % — ABNORMAL HIGH (ref 4.8–5.6)
Mean Plasma Glucose: 180.03 mg/dL

## 2021-02-11 LAB — PROCALCITONIN: Procalcitonin: <0.1 ng/mL

## 2021-02-11 LAB — CBG MONITORING, ED
Glucose-Capillary: 212 mg/dL — ABNORMAL HIGH (ref 70–99)
Glucose-Capillary: 220 mg/dL — ABNORMAL HIGH (ref 70–99)
Glucose-Capillary: 203 mg/dL — ABNORMAL HIGH (ref 70–99)
Glucose-Capillary: 231 mg/dL — ABNORMAL HIGH (ref 70–99)
Glucose-Capillary: 232 mg/dL — ABNORMAL HIGH (ref 70–99)

## 2021-02-11 LAB — LACTIC ACID, PLASMA
Lactic Acid, Venous: 1.8 mmol/L (ref 0.5–1.9)
Lactic Acid, Venous: 2.9 mmol/L (ref 0.5–1.9)

## 2021-02-11 LAB — BRAIN NATRIURETIC PEPTIDE: B Natriuretic Peptide: 13.3 pg/mL (ref 0.0–100.0)

## 2021-02-11 LAB — TROPONIN I (HIGH SENSITIVITY)
Troponin I (High Sensitivity): 8 ng/L (ref <18)
Troponin I (High Sensitivity): 9 ng/L (ref <18)

## 2021-02-11 MED ORDER — HYDRALAZINE HCL 20 MG/ML IJ SOLN: 5.0000 mg | INTRAMUSCULAR | Status: DC | PRN

## 2021-02-11 MED ORDER — ONDANSETRON HCL 4 MG/2ML IJ SOLN
4.0000 mg | Freq: Three times a day (TID) | INTRAMUSCULAR | Status: DC | PRN
  Administered 2021-02-11 – 2021-02-12 (×2): 4 mg via INTRAVENOUS
  Filled 2021-02-11 (×2): qty 2

## 2021-02-11 MED ORDER — AZITHROMYCIN 500 MG PO TABS
500.0000 mg | ORAL_TABLET | Freq: Every day | ORAL | Status: AC
  Administered 2021-02-11: 500 mg via ORAL
  Filled 2021-02-11: qty 1

## 2021-02-11 MED ORDER — NITROGLYCERIN 0.4 MG SL SUBL: 0.4000 mg | SUBLINGUAL_TABLET | SUBLINGUAL | Status: DC | PRN

## 2021-02-11 MED ORDER — ACETAMINOPHEN 325 MG PO TABS
650.0000 mg | ORAL_TABLET | Freq: Four times a day (QID) | ORAL | Status: DC | PRN
  Filled 2021-02-11: qty 2

## 2021-02-11 MED ORDER — INSULIN ASPART 100 UNIT/ML IJ SOLN
0.0000 [IU] | Freq: Every day | INTRAMUSCULAR | Status: DC
Start: 2021-02-11 — End: 2021-02-14
  Administered 2021-02-11: 2 [IU] via SUBCUTANEOUS
  Filled 2021-02-11: qty 1

## 2021-02-11 MED ORDER — DIPHENHYDRAMINE HCL 25 MG PO CAPS: 25.0000 mg | ORAL_CAPSULE | Freq: Once | ORAL | Status: DC

## 2021-02-11 MED ORDER — METHYLPREDNISOLONE SODIUM SUCC 40 MG IJ SOLR: 40.0000 mg | Freq: Two times a day (BID) | INTRAMUSCULAR | Status: DC

## 2021-02-11 MED ORDER — METHYLPREDNISOLONE SODIUM SUCC 125 MG IJ SOLR
125.0000 mg | Freq: Once | INTRAMUSCULAR | Status: AC
  Administered 2021-02-11: 125 mg via INTRAVENOUS
  Filled 2021-02-11: qty 2

## 2021-02-11 MED ORDER — DM-GUAIFENESIN ER 30-600 MG PO TB12: 1.0000 | ORAL_TABLET | Freq: Two times a day (BID) | ORAL | Status: DC | PRN

## 2021-02-11 MED ORDER — METHYLPREDNISOLONE SODIUM SUCC 125 MG IJ SOLR
80.0000 mg | INTRAMUSCULAR | Status: DC
  Administered 2021-02-11: 80 mg via INTRAVENOUS
  Filled 2021-02-11: qty 2

## 2021-02-11 MED ORDER — ALBUTEROL SULFATE (2.5 MG/3ML) 0.083% IN NEBU
2.5000 mg | INHALATION_SOLUTION | RESPIRATORY_TRACT | Status: DC | PRN
  Administered 2021-02-11: 2.5 mg via RESPIRATORY_TRACT
  Filled 2021-02-11: qty 3

## 2021-02-11 MED ORDER — SODIUM CHLORIDE 0.9 % IV BOLUS
2000.0000 mL | Freq: Once | INTRAVENOUS | Status: AC
  Administered 2021-02-11: 2000 mL via INTRAVENOUS

## 2021-02-11 MED ORDER — AZITHROMYCIN 250 MG PO TABS
250.0000 mg | ORAL_TABLET | Freq: Every day | ORAL | Status: DC
  Administered 2021-02-12 – 2021-02-14 (×3): 250 mg via ORAL
  Filled 2021-02-11 (×3): qty 1

## 2021-02-11 MED ORDER — INSULIN ASPART 100 UNIT/ML IJ SOLN
0.0000 [IU] | Freq: Three times a day (TID) | INTRAMUSCULAR | Status: DC
  Administered 2021-02-11 – 2021-02-12 (×4): 3 [IU] via SUBCUTANEOUS
  Administered 2021-02-12: 1 [IU] via SUBCUTANEOUS
  Administered 2021-02-12: 3 [IU] via SUBCUTANEOUS
  Administered 2021-02-13: 2 [IU] via SUBCUTANEOUS
  Administered 2021-02-13: 1 [IU] via SUBCUTANEOUS
  Administered 2021-02-13: 3 [IU] via SUBCUTANEOUS
  Administered 2021-02-14: 2 [IU] via SUBCUTANEOUS
  Filled 2021-02-11 (×10): qty 1

## 2021-02-11 MED ORDER — IPRATROPIUM-ALBUTEROL 0.5-2.5 (3) MG/3ML IN SOLN: 3.0000 mL | Freq: Once | RESPIRATORY_TRACT | Status: DC

## 2021-02-11 MED ORDER — TRAZODONE HCL 50 MG PO TABS
50.0000 mg | ORAL_TABLET | Freq: Once | ORAL | Status: AC
  Administered 2021-02-11: 50 mg via ORAL
  Filled 2021-02-11: qty 1

## 2021-02-11 MED ORDER — IOHEXOL 350 MG/ML SOLN
75.0000 mL | Freq: Once | INTRAVENOUS | Status: AC | PRN
  Administered 2021-02-11: 75 mL via INTRAVENOUS

## 2021-02-11 MED ORDER — IPRATROPIUM-ALBUTEROL 0.5-2.5 (3) MG/3ML IN SOLN
3.0000 mL | RESPIRATORY_TRACT | Status: DC
  Administered 2021-02-11 – 2021-02-13 (×11): 3 mL via RESPIRATORY_TRACT
  Filled 2021-02-11 (×11): qty 3

## 2021-02-11 MED ORDER — SODIUM CHLORIDE 0.9 % IV SOLN: INTRAVENOUS | Status: DC

## 2021-02-11 MED ORDER — MORPHINE SULFATE (PF) 2 MG/ML IV SOLN: 1.0000 mg | INTRAVENOUS | Status: DC | PRN

## 2021-02-11 NOTE — ED Notes (Signed)
This RN responds to call light. Pt noted to have walked to bathroom sans equipment or oxygen. Pt 83% on RA. Again advised of risks of behavior. Pt adamant he will continue to do this behavior. Pt aware of risks & deterioration of status. Pt noted to be in mild resp distress, coached through breathing techniques until back to baseline.   Call light in reach. Denies further needs at this time.

## 2021-02-11 NOTE — ED Notes (Signed)
Up to chair. Gait steady. No complaints of chest pain. No signs of distress.

## 2021-02-11 NOTE — H&P (Signed)
History and Physical    BURMAN BRUINGTON BWG:[id number] DOB: 1945/12/24 DOA: 02/11/2021  Referring MD/NP/PA:   PCP: Juluis Pitch, MD   Patient coming from:  The patient is coming from home.  At baseline, pt is independent for most of ADL.        Chief Complaint: Shortness of breath  HPI: Henry [name] is a 75 y.o. male with medical history significant of hypertension, hyperlipidemia, diabetes mellitus, COPD, CAD, stent placement, CKD-3A, skin cancer, who presents with shortness of breath.  Patient states that she has been having short breath for almost a week, which has been progressively worsening.  Patient has cough with little mucus production, no fever or chills.  He states that he had some chest pain yesterday, which has resolved.  Currently no active chest pain.  No nausea, vomiting, diarrhea or abdominal pain.  No symptoms of UTI.  Patient is not using oxygen normally at home, but was found to have oxygen desaturating to 88-89% on room air, which improved to 97% on 2 L oxygen in ED.  ED Course: pt was found to have WBC 7.6, troponin level 6 --> 8,  BNP 13, negative COVID PCR, stable renal function, temperature normal, blood pressure 129/81, heart rate 116, RR 27, chest x-ray showed streaks interstitial obesity.  CT angiograms negative for PE. Patient is admitted to progressive bed as inpatient.  Review of Systems:   General: no fevers, chills, no body weight gain, has poor appetite, has fatigue HEENT: no blurry vision, hearing changes or sore throat Respiratory: has dyspnea, coughing, wheezing CV: had chest pain, no palpitations GI: no nausea, vomiting, abdominal pain, diarrhea, constipation GU: no dysuria, burning on urination, increased urinary frequency, hematuria  Ext: has trace leg edema Neuro: no unilateral weakness, numbness, or tingling, no vision change or hearing loss Skin: no rash, no skin tear. MSK: No muscle spasm, no deformity, no limitation of range  of movement in spin Heme: No easy bruising.  Travel history: No recent long distant travel.  Allergy:  Allergies  Allergen Reactions   Lisinopril Cough    Past Medical History:  Diagnosis Date   Anginal pain ([place])    Arthritis    CAD (coronary artery disease)    a. anterior stemi 08/10/2017; b. LHC 08/10/17: pLAD 70, p-mLAD 99 thrombotic, ostD1 70, p-mLCx 30, pRCA-1 50 calcified, pRCA-2 99 calcified, dRCA 30. Successful PCI/DES to p-mLAD x 2   Cancer (HCC)    skin   CKD (chronic kidney disease), stage II    COPD (chronic obstructive pulmonary disease) (HCC)    Glaucoma    History of kidney stones    Hyperlipidemia    Hypertension    Insulin dependent diabetes mellitus    Ischemic cardiomyopathy    a. TTE 08/10/17: technically difficult study, no diagnostic RWMA, EF 40-50%, poor windows, very difficult study    Myocardial infarction [facility])    Neuropathy    S/P angioplasty with stent 08/10/17 emergently to LAD ,  08/13/17 atherectomy and stent to pRCA  08/14/2017    Past Surgical History:  Procedure Laterality Date   CARDIAC CATHETERIZATION     COLONOSCOPY WITH PROPOFOL N/A 12/08/2014   Procedure: COLONOSCOPY WITH PROPOFOL;  Surgeon: Manya Silvas, MD;  Location: [place];  Service: Endoscopy;  Laterality: N/A;   COLONOSCOPY WITH PROPOFOL N/A 01/10/2016   Procedure: COLONOSCOPY WITH PROPOFOL;  Surgeon: Manya Silvas, MD;  Location: [facility] ENDOSCOPY;  Service: Endoscopy;  Laterality: N/A;   COLONOSCOPY WITH  PROPOFOL N/A 04/28/2019   Procedure: COLONOSCOPY WITH PROPOFOL;  Surgeon: Robert Bellow, MD;  Location: [facility] ENDOSCOPY;  Service: Endoscopy;  Laterality: N/A;   COLONOSCOPY WITH PROPOFOL N/A 03/17/2020   Procedure: COLONOSCOPY WITH PROPOFOL;  Surgeon: Robert Bellow, MD;  Location: [facility] ENDOSCOPY;  Service: Endoscopy;  Laterality: N/A;   CORONARY ATHERECTOMY N/A 08/13/2017   Procedure: CORONARY ATHERECTOMY;  Surgeon: Wellington Hampshire, MD;  Location: [place] CV LAB;   Service: Cardiovascular;  Laterality: N/A;   CORONARY STENT INTERVENTION N/A 08/13/2017   Procedure: CORONARY STENT INTERVENTION;  Surgeon: Wellington Hampshire, MD;  Location: [place] CV LAB;  Service: Cardiovascular;  Laterality: N/A;   CORONARY/GRAFT ACUTE MI REVASCULARIZATION N/A 08/10/2017   Procedure: Coronary/Graft Acute MI Revascularization;  Surgeon: Burnell Blanks, MD;  Location: [place] CV LAB;  Service: Cardiovascular;  Laterality: N/A;   EXTRACORPOREAL SHOCK WAVE LITHOTRIPSY Right 11/09/2015   Procedure: EXTRACORPOREAL SHOCK WAVE LITHOTRIPSY (ESWL);  Surgeon: Royston Cowper, MD;  Location: [facility] ORS;  Service: Urology;  Laterality: Right;   EXTRACORPOREAL SHOCK WAVE LITHOTRIPSY Left 10/17/2016   Procedure: EXTRACORPOREAL SHOCK WAVE LITHOTRIPSY (ESWL);  Surgeon: Royston Cowper, MD;  Location: [facility] ORS;  Service: Urology;  Laterality: Left;   LEFT HEART CATH AND CORONARY ANGIOGRAPHY N/A 08/10/2017   Procedure: LEFT HEART CATH AND CORONARY ANGIOGRAPHY;  Surgeon: Burnell Blanks, MD;  Location: [place] CV LAB;  Service: Cardiovascular;  Laterality: N/A;   LITHOTRIPSY     x 6   PAROTIDECTOMY  2015   TEMPORARY PACEMAKER N/A 08/13/2017   Procedure: TEMPORARY PACEMAKER;  Surgeon: Wellington Hampshire, MD;  Location: [place] CV LAB;  Service: Cardiovascular;  Laterality: N/A;   TONSILLECTOMY      Social History:  reports that he quit smoking about 3 years ago. His smoking use included cigarettes. He has a 50.00 pack-year smoking history. He has never used smokeless tobacco. He reports that he does not drink alcohol and does not use drugs.  Family History:  Family History  Problem Relation Age of Onset   Heart attack Mother    Melanoma Father    Parkinsonism Father      Prior to Admission medications   Medication Sig Start Date End Date Taking? Authorizing Provider  aspirin EC 81 MG EC tablet Take 1 tablet (81 mg total) by mouth daily. 08/15/17  Yes Isaiah Serge, NP  atorvastatin (LIPITOR) 80 MG tablet Take 1 tablet (80 mg total) by mouth daily at 6 PM. Patient taking differently: Take 80 mg by mouth daily. 08/14/17  Yes Isaiah Serge, NP  cetirizine (ZYRTEC) 10 MG tablet Take 10 mg by mouth daily.   Yes [provider]  COMBIVENT RESPIMAT 20-100 MCG/ACT AERS respimat Inhale 1 puff into the lungs 4 (four) times daily. 07/22/20  Yes [provider]  diclofenac Sodium (VOLTAREN) 1 % GEL APPLY 4 GRAMS TOPICALLY FOUR TIMES A DAY AS NEEDED FOR PAIN 08/11/20  Yes [provider]  fluticasone (FLONASE) 50 MCG/ACT nasal spray Place 1 spray into both nostrils daily.   Yes [provider]  gabapentin (NEURONTIN) 600 MG tablet Take 600 mg by mouth 4 (four) times daily.    Yes [provider]  HYDROcodone-acetaminophen (NORCO/VICODIN) 5-325 MG tablet TAKE 1 TABLET BY MOUTH 3 TIMES A DAY AS NEEDED FOR PAIN. MAY MAKE YOU DROWSY.THESE TABLETS HAVE 325MG  ACETAMINOPHEN(GENERIC FOR TYLENOL);MAXIMUM SAFE DOSE 3000MG  ACETAMINOPHEN A DAY. DOSE ADJUSTMENT 09/13/14  Yes [provider]  insulin detemir (LEVEMIR) 100 UNIT/ML injection Inject 0.93 mLs (93 Units total) into the skin 2 (two) times daily. Patient taking differently: Inject 92 Units into the skin 2 (two) times daily. 08/03/20  Yes Jennye Boroughs, MD  irbesartan (AVAPRO) 150 MG tablet Take 150 mg by mouth daily. 11/22/19  Yes [provider]  metoprolol tartrate (LOPRESSOR) 25 MG tablet Take 1 tablet (25 mg total) by mouth 2 (two) times daily. 08/14/17  Yes Isaiah Serge, NP  montelukast (SINGULAIR) 10 MG tablet Take 10 mg by mouth at bedtime.  07/10/17  Yes [provider]  Multiple Vitamin (MULTIVITAMIN WITH MINERALS) TABS tablet Take 1 tablet by mouth daily.   Yes [provider]  sitaGLIPtin-metformin (JANUMET) 50-1000 MG tablet Take 1 tablet by mouth 2 (two) times daily with a meal.   Yes [provider]  tamsulosin (FLOMAX) 0.4  MG CAPS capsule Take 0.4 mg by mouth daily. 07/07/20  Yes [provider]  Fluticasone-Salmeterol (ADVAIR) 250-50 MCG/DOSE AEPB Inhale 1 puff into the lungs 2 (two) times daily. Patient not taking: No sig reported    [provider]  furosemide (LASIX) 20 MG tablet Take 20 mg by mouth daily. Patient not taking: Reported on 02/11/2021 12/06/19   [provider]  nitroGLYCERIN (NITROSTAT) 0.4 MG SL tablet PLACE 1 TABLET UNDER TONGUE EVERY 5 MIN AS NEEDED FOR CHEST PAIN IF NO RELIEF IN15 MIN CALL 911 (MAX 3 TABS) Patient taking differently: Place 0.4 mg under the tongue every 5 (five) minutes as needed for chest pain. IF NO RELIEF IN15 MIN CALL 911 (MAX 3 TABS) 06/29/19   Isaiah Serge, NP    Physical Exam: Vitals:   02/11/21 0537 02/11/21 0700 02/11/21 1100 02/11/21 1145  BP: 129/81 (!) 151/92 129/77 (!) 147/78  Pulse: (!) 116 91 100 (!) 105  Resp: (!) 27 (!) 21  18  Temp: 97.6 F (36.4 C)   98.2 F (36.8 C)  TempSrc: Oral     SpO2: 97% 94% 95% 90%  Weight:      Height:       General: Not in acute distress HEENT:       Eyes: PERRL, EOMI, no scleral icterus.       ENT: No discharge from the ears and nose, no pharynx injection, no tonsillar enlargement.        Neck: No JVD, no bruit, no mass felt. Heme: No neck lymph node enlargement. Cardiac: S1/S2, RRR, No murmurs, No gallops or rubs. Respiratory: Has decreased air movement bilaterally with mild wheezing bilaterally GI: Soft, nondistended, nontender, no rebound pain, no organomegaly, BS present. GU: No hematuria Ext: Has trace leg edema bilaterally. 1+DP/PT pulse bilaterally. Musculoskeletal: No joint deformities, No joint redness or warmth, no limitation of ROM in spin. Skin: No rashes.  Neuro: Alert, oriented X3, cranial nerves II-XII grossly intact, moves all extremities normally. Psych: Patient is not psychotic, no suicidal or hemocidal ideation.  Labs on Admission: I have personally reviewed following  labs and imaging studies  CBC: Recent Labs  Lab 02/10/21 1548  WBC 7.6  NEUTROABS 4.4  HGB 16.2  HCT 50.8  MCV 88.0  PLT 811   Basic Metabolic Panel: Recent Labs  Lab 02/10/21 1548  NA 137  K 4.4  CL 100  CO2 27  GLUCOSE 198*  BUN 19  CREATININE 1.32*  CALCIUM 10.2   GFR: Estimated Creatinine Clearance: 60.4 mL/min (A) (by C-G formula based on SCr of 1.32 mg/dL (H)). Liver Function  Tests: No results for input(s): AST, ALT, ALKPHOS, BILITOT, PROT, ALBUMIN in the last 168 hours. No results for input(s): LIPASE, AMYLASE in the last 168 hours. No results for input(s): AMMONIA in the last 168 hours. Coagulation Profile: No results for input(s): INR, PROTIME in the last 168 hours. Cardiac Enzymes: No results for input(s): CKTOTAL, CKMB, CKMBINDEX, TROPONINI in the last 168 hours. BNP (last 3 results) No results for input(s): PROBNP in the last 8760 hours. HbA1C: No results for input(s): HGBA1C in the last 72 hours. CBG: Recent Labs  Lab 02/11/21 0335 02/11/21 0911 02/11/21 1154  GLUCAP 212* 220* 203*   Lipid Profile: No results for input(s): CHOL, HDL, LDLCALC, TRIG, CHOLHDL, LDLDIRECT in the last 72 hours. Thyroid Function Tests: No results for input(s): TSH, T4TOTAL, FREET4, T3FREE, THYROIDAB in the last 72 hours. Anemia Panel: No results for input(s): VITAMINB12, FOLATE, FERRITIN, TIBC, IRON, RETICCTPCT in the last 72 hours. Urine analysis:    Component Value Date/Time   COLORURINE YELLOW (A) 12/25/2019 0123   APPEARANCEUR CLEAR (A) 12/25/2019 0123   LABSPEC 1.031 (H) 12/25/2019 0123   PHURINE 6.0 12/25/2019 0123   GLUCOSEU >=500 (A) 12/25/2019 0123   HGBUR NEGATIVE 12/25/2019 0123   BILIRUBINUR NEGATIVE 12/25/2019 0123   KETONESUR 5 (A) 12/25/2019 0123   PROTEINUR NEGATIVE 12/25/2019 0123   NITRITE NEGATIVE 12/25/2019 0123   LEUKOCYTESUR NEGATIVE 12/25/2019 0123   Sepsis Labs: @LABRCNTIP (procalcitonin:4,lacticidven:4) ) Recent Results (from the  past 240 hour(s))  Resp Panel by RT-PCR (Flu A&B, Covid) Nasopharyngeal Swab     Status: None   Collection Time: 02/10/21  3:48 PM   Specimen: Nasopharyngeal Swab; Nasopharyngeal(NP) swabs in vial transport medium  Result Value Ref Range Status   SARS Coronavirus 2 by RT PCR NEGATIVE NEGATIVE Final    Comment: (NOTE) SARS-CoV-2 target nucleic acids are NOT DETECTED.  The SARS-CoV-2 RNA is generally detectable in upper respiratory specimens during the acute phase of infection. The lowest concentration of SARS-CoV-2 viral copies this assay can detect is 138 copies/mL. A negative result does not preclude SARS-Cov-2 infection and should not be used as the sole basis for treatment or other patient management decisions. A negative result may occur with  improper specimen collection/handling, submission of specimen other than nasopharyngeal swab, presence of viral mutation(s) within the areas targeted by this assay, and inadequate number of viral copies(<138 copies/mL). A negative result must be combined with clinical observations, patient history, and epidemiological information. The expected result is Negative.  Fact Sheet for Patients:  EntrepreneurPulse.com.au  Fact Sheet for Healthcare Providers:  IncredibleEmployment.be  This test is no t yet approved or cleared by the [place] FDA and  has been authorized for detection and/or diagnosis of SARS-CoV-2 by FDA under an Emergency Use Authorization (EUA). This EUA will remain  in effect (meaning this test can be used) for the duration of the COVID-19 declaration under Section 564(b)(1) of the Act, 21 U.S.C.section 360bbb-3(b)(1), unless the authorization is terminated  or revoked sooner.       Influenza A by PCR NEGATIVE NEGATIVE Final   Influenza B by PCR NEGATIVE NEGATIVE Final    Comment: (NOTE) The Xpert Xpress SARS-CoV-2/FLU/RSV plus assay is intended as an aid in the diagnosis of  influenza from Nasopharyngeal swab specimens and should not be used as a sole basis for treatment. Nasal washings and aspirates are unacceptable for Xpert Xpress SARS-CoV-2/FLU/RSV testing.  Fact Sheet for Patients: EntrepreneurPulse.com.au  Fact Sheet for Healthcare Providers: IncredibleEmployment.be  This test is not  yet approved or cleared by the [place] and has been authorized for detection and/or diagnosis of SARS-CoV-2 by FDA under an Emergency Use Authorization (EUA). This EUA will remain in effect (meaning this test can be used) for the duration of the COVID-19 declaration under Section 564(b)(1) of the Act, 21 U.S.C. section 360bbb-3(b)(1), unless the authorization is terminated or revoked.  Performed at [facility], [address] [place], [place] [address]      Radiological Exams on Admission: DG Chest 2 View  Result Date: 02/10/2021 CLINICAL DATA:  Chest pain. EXAM: CHEST - 2 VIEW COMPARISON:  October 20, 2020 FINDINGS: Tortuosity of the aorta and calcific atherosclerotic disease. Cardiomediastinal silhouette is normal. Mediastinal contours appear intact. No new lobar consolidation. Persistent thickening of the right pleura with streaky interstitial opacities in the right mid thorax and lingula. The findings are relatively stable from July 2022. Osseous structures are without acute abnormality. Soft tissues are grossly normal. IMPRESSION: 1. No active cardiopulmonary disease. 2. Persistent thickening of the right pleura with streaky interstitial opacities in the right mid thorax and lingula. Electronically Signed   By: Fidela Salisbury M.D.   On: 02/10/2021 17:01   CT Angio Chest PE W and/or Wo Contrast  Result Date: 02/11/2021 CLINICAL DATA:  PE suspected, high prob.  Chest pain.  Cough. EXAM: CT ANGIOGRAPHY CHEST WITH CONTRAST TECHNIQUE: Multidetector CT imaging of the chest was performed using the standard protocol  during bolus administration of intravenous contrast. Multiplanar CT image reconstructions and MIPs were obtained to evaluate the vascular anatomy. CONTRAST:  13mL OMNIPAQUE IOHEXOL 350 MG/ML SOLN COMPARISON:  12/24/2019 chest CT angiogram. FINDINGS: Cardiovascular: The study is high quality for the evaluation of pulmonary embolism. There are no filling defects in the central, lobar, segmental or subsegmental pulmonary artery branches to suggest acute pulmonary embolism. Atherosclerotic nonaneurysmal thoracic aorta. Dilated main pulmonary artery (3.5 cm diameter). Normal heart size. No significant pericardial fluid/thickening. Three-vessel coronary atherosclerosis. Mediastinum/Nodes: No discrete thyroid nodules. Unremarkable esophagus. No pathologically enlarged axillary, mediastinal or hilar lymph nodes. Lungs/Pleura: No pneumothorax. Stable tiny calcified posterior right pleural plaques. No pleural effusions. Mild-to-moderate centrilobular emphysema with mild diffuse bronchial wall thickening. No acute consolidative airspace disease or lung masses. Anterior right lower lobe 3 mm solid pulmonary nodule (series 6/image 55), stable, considered benign. No new significant pulmonary nodules. Small parenchymal bands in the dependent lung bases bilaterally, unchanged, compatible with nonspecific mild postinfectious/postinflammatory scarring. Upper abdomen: Nonobstructing stones in the upper kidneys bilaterally, largest 7 mm on the right and 8 mm on the left. Subcentimeter hypodense bilateral upper renal cortical lesions are too small to characterize and require no follow-up. Musculoskeletal: No aggressive appearing focal osseous lesions. Mild thoracic spondylosis. Review of the MIP images confirms the above findings. IMPRESSION: 1. No pulmonary embolism. 2. Mild-to-moderate centrilobular emphysema with mild diffuse bronchial wall thickening, suggesting COPD. 3. Dilated main pulmonary artery, suggesting pulmonary arterial  hypertension. 4. Three-vessel coronary atherosclerosis. 5. Nonobstructing bilateral nephrolithiasis. 6. Aortic Atherosclerosis (ICD10-I70.0) and Emphysema (ICD10-J43.9). Electronically Signed   By: Ilona Sorrel M.D.   On: 02/11/2021 06:45     EKG: I have personally reviewed.  Sinus rhythm, QTC 433, LAE, frequent PVC, RAD, ST depression in inferior leads and V3-V6.  Assessment/Plan Principal Problem:   COPD exacerbation (HCC) Active Problems:   HTN (hypertension)   HLD (hyperlipidemia)   CAD in native artery   Acute respiratory failure with hypoxia (HCC)   Chest pain   CKD (chronic kidney disease), stage IIIa  Severe sepsis (HCC)  Severe sepsis and acute respiratory failure with hypoxia due to COPD exacerbation [facility]): Patient meets criteria for severe sepsis with heart rate 116 and RR 27, elevated lactic acid of 2.9.  Currently hemodynamically stable  - will admit to progressive unit as inpatient -Bronchodilators -Solu-Medrol 40 mg IV bid -Z pak  -Mucinex for cough  -Incentive spirometry -Follow up blood culture x2, sputum culture,  RVP -Nasal cannula oxygen as needed to maintain O2 saturation 93% or greater -will get Procalcitonin and trend lactic acid levels per sepsis protocol. -IVF: 2L of NS bolus in ED, followed by 75 cc/h   HTN (hypertension) -IV hydralazine as needed -Irbesartan, metoprolol,  HLD (hyperlipidemia) -Lipitor  CAD in native artery and chest pain: Patient chest pain has resolved currently.  Troponin level 6 --> 8 --> 9.  Likely had demand ischemia -Aspirin, Lipitor, metoprolol -As needed morphine, nitroglycerin -Check A1c, FLP  CKD (chronic kidney disease), stage IIIa: Stable -Follow-up with BMP    DVT ppx:  SQ Lovenox Code Status: Full code Family Communication: Yes, patient's friend at bed side Disposition Plan:  Anticipate discharge back to previous environment Consults called:  none Admission status and Level of care: Progressive Cardiac:      as inpt       Status is: Inpatient  Remains inpatient appropriate because: Patient has multiple committees, now presents with COPD exacerbation with new oxygen requirement.  Patient also meets criteria for sepsis.  His presentation is highly complicated.  Patient is at high risk of deteriorating.  Will need to be treated in hospital for at least 2 days.        Date of Service 02/11/2021    Ivor Costa [facility]   If 7PM-7AM, please contact night-coverage [email or web address] 02/11/2021, 2:45 PM

## 2021-02-11 NOTE — ED Notes (Signed)
This RN responds to call light & adjusts bed to level of comfort per request.   Pt states "It's a shame you wont ask the doctor for more gabapentin, I like how it knocks me out if I take more". Pt advised he may not have more & was given sleep aid trazodone.   Call light in reach. Denies other needs at this time.

## 2021-02-11 NOTE — ED Provider Notes (Signed)
[facility] Emergency Department Provider Note  ____________________________________________   Event Date/Time   First MD Initiated Contact with Patient 02/11/21 0518     (approximate)  I have reviewed the triage vital signs and the nursing notes.   HISTORY  Chief Complaint Cough and Chest Pain    HPI Henry [name] is a 75 y.o. male with history of CAD status post stent, COPD, hypertension, hyperlipidemia, ischemic cardiomyopathy, chronic kidney disease who presents to the emergency department left-sided chest heaviness that started yesterday.  Chest pain is now completely resolved.  No aggravating relieving factors.  Denies any shortness of breath but was found to be hypoxic here.  Does not wear oxygen chronically.  He states he did hear a "crackling" that has now resolved.  No fevers or productive cough.  No lower extremity swelling or pain.  No history of PE or DVT.  States he quit smoking in 2019.        Past Medical History:  Diagnosis Date   Anginal pain ([place])    Arthritis    CAD (coronary artery disease)    a. anterior stemi 08/10/2017; b. LHC 08/10/17: pLAD 70, p-mLAD 99 thrombotic, ostD1 70, p-mLCx 30, pRCA-1 50 calcified, pRCA-2 99 calcified, dRCA 30. Successful PCI/DES to p-mLAD x 2   Cancer (HCC)    skin   CKD (chronic kidney disease), stage II    COPD (chronic obstructive pulmonary disease) (HCC)    Glaucoma    History of kidney stones    Hyperlipidemia    Hypertension    Insulin dependent diabetes mellitus    Ischemic cardiomyopathy    a. TTE 08/10/17: technically difficult study, no diagnostic RWMA, EF 40-50%, poor windows, very difficult study    Myocardial infarction [facility])    Neuropathy    S/P angioplasty with stent 08/10/17 emergently to LAD ,  08/13/17 atherectomy and stent to pRCA  08/14/2017    Patient Active Problem List   Diagnosis Date Noted   Chest pain 08/02/2020   Unstable angina ([place]) 08/02/2020   Dyspnea on exertion  12/25/2019   Acute respiratory failure with hypoxia ([place]) 12/25/2019   Hypoxia 12/24/2019   S/P angioplasty with stent 08/10/17 emergently to LAD ,  08/13/17 atherectomy and stent to pRCA  08/14/2017   CAD in native artery 08/12/2017   Leukocytosis 08/12/2017   Insulin dependent diabetes mellitus 08/12/2017   NSTEMI (non-ST elevated myocardial infarction) ([place]) 08/12/2017   Acute ST elevation myocardial infarction (STEMI) involving left anterior descending (LAD) coronary artery ([place]) 08/10/2017   ST elevation myocardial infarction involving left anterior descending (LAD) coronary artery (HCC)    COPD (chronic obstructive pulmonary disease) ([place]) 06/02/2017   CAP (community acquired pneumonia) 01/25/2016   Diabetes ([place]) 01/25/2016   HTN (hypertension) 01/25/2016   HLD (hyperlipidemia) 01/25/2016   Benign essential hypertension 07/12/2013    Past Surgical History:  Procedure Laterality Date   CARDIAC CATHETERIZATION     COLONOSCOPY WITH PROPOFOL N/A 12/08/2014   Procedure: COLONOSCOPY WITH PROPOFOL;  Surgeon: Manya Silvas, MD;  Location: [place];  Service: Endoscopy;  Laterality: N/A;   COLONOSCOPY WITH PROPOFOL N/A 01/10/2016   Procedure: COLONOSCOPY WITH PROPOFOL;  Surgeon: Manya Silvas, MD;  Location: [facility] ENDOSCOPY;  Service: Endoscopy;  Laterality: N/A;   COLONOSCOPY WITH PROPOFOL N/A 04/28/2019   Procedure: COLONOSCOPY WITH PROPOFOL;  Surgeon: Robert Bellow, MD;  Location: [facility] ENDOSCOPY;  Service: Endoscopy;  Laterality: N/A;   COLONOSCOPY WITH PROPOFOL N/A 03/17/2020  Procedure: COLONOSCOPY WITH PROPOFOL;  Surgeon: Robert Bellow, MD;  Location: [facility] ENDOSCOPY;  Service: Endoscopy;  Laterality: N/A;   CORONARY ATHERECTOMY N/A 08/13/2017   Procedure: CORONARY ATHERECTOMY;  Surgeon: [place] Hampshire, MD;  Location: [place] CV LAB;  Service: Cardiovascular;  Laterality: N/A;   CORONARY STENT INTERVENTION N/A 08/13/2017   Procedure: CORONARY STENT INTERVENTION;   Surgeon: [place] Hampshire, MD;  Location: [place] CV LAB;  Service: Cardiovascular;  Laterality: N/A;   CORONARY/GRAFT ACUTE MI REVASCULARIZATION N/A 08/10/2017   Procedure: Coronary/Graft Acute MI Revascularization;  Surgeon: Burnell Blanks, MD;  Location: [place] CV LAB;  Service: Cardiovascular;  Laterality: N/A;   EXTRACORPOREAL SHOCK WAVE LITHOTRIPSY Right 11/09/2015   Procedure: EXTRACORPOREAL SHOCK WAVE LITHOTRIPSY (ESWL);  Surgeon: Royston Cowper, MD;  Location: [facility] ORS;  Service: Urology;  Laterality: Right;   EXTRACORPOREAL SHOCK WAVE LITHOTRIPSY Left 10/17/2016   Procedure: EXTRACORPOREAL SHOCK WAVE LITHOTRIPSY (ESWL);  Surgeon: Royston Cowper, MD;  Location: [facility] ORS;  Service: Urology;  Laterality: Left;   LEFT HEART CATH AND CORONARY ANGIOGRAPHY N/A 08/10/2017   Procedure: LEFT HEART CATH AND CORONARY ANGIOGRAPHY;  Surgeon: Burnell Blanks, MD;  Location: [place] CV LAB;  Service: Cardiovascular;  Laterality: N/A;   LITHOTRIPSY     x 6   PAROTIDECTOMY  2015   TEMPORARY PACEMAKER N/A 08/13/2017   Procedure: TEMPORARY PACEMAKER;  Surgeon: [place] Hampshire, MD;  Location: [place] CV LAB;  Service: Cardiovascular;  Laterality: N/A;   TONSILLECTOMY      Prior to Admission medications   Medication Sig Start Date End Date Taking? Authorizing Provider  aspirin EC 81 MG EC tablet Take 1 tablet (81 mg total) by mouth daily. 08/15/17  Yes Isaiah Serge, NP  atorvastatin (LIPITOR) 80 MG tablet Take 1 tablet (80 mg total) by mouth daily at 6 PM. Patient taking differently: Take 80 mg by mouth daily. 08/14/17  Yes Isaiah Serge, NP  cetirizine (ZYRTEC) 10 MG tablet Take 10 mg by mouth daily.   Yes [provider]  COMBIVENT RESPIMAT 20-100 MCG/ACT AERS respimat Inhale 1 puff into the lungs 4 (four) times daily. 07/22/20  Yes [provider]  diclofenac Sodium (VOLTAREN) 1 % GEL APPLY 4 GRAMS TOPICALLY FOUR TIMES A DAY AS NEEDED FOR PAIN  08/11/20  Yes [provider]  fluticasone (FLONASE) 50 MCG/ACT nasal spray Place 1 spray into both nostrils daily.   Yes [provider]  gabapentin (NEURONTIN) 600 MG tablet Take 600 mg by mouth 4 (four) times daily.    Yes [provider]  HYDROcodone-acetaminophen (NORCO/VICODIN) 5-325 MG tablet TAKE 1 TABLET BY MOUTH 3 TIMES A DAY AS NEEDED FOR PAIN. MAY MAKE YOU DROWSY.THESE TABLETS HAVE 325MG  ACETAMINOPHEN(GENERIC FOR TYLENOL);MAXIMUM SAFE DOSE 3000MG  ACETAMINOPHEN A DAY. DOSE ADJUSTMENT 09/13/14  Yes [provider]  insulin detemir (LEVEMIR) 100 UNIT/ML injection Inject 0.93 mLs (93 Units total) into the skin 2 (two) times daily. Patient taking differently: Inject 92 Units into the skin 2 (two) times daily. 08/03/20  Yes Jennye Boroughs, MD  irbesartan (AVAPRO) 150 MG tablet Take 150 mg by mouth daily. 11/22/19  Yes [provider]  metoprolol tartrate (LOPRESSOR) 25 MG tablet Take 1 tablet (25 mg total) by mouth 2 (two) times daily. 08/14/17  Yes Isaiah Serge, NP  montelukast (SINGULAIR) 10 MG tablet Take 10 mg by mouth at bedtime.  07/10/17  Yes [provider]  Multiple Vitamin (MULTIVITAMIN WITH MINERALS) TABS  tablet Take 1 tablet by mouth daily.   Yes [provider]  sitaGLIPtin-metformin (JANUMET) 50-1000 MG tablet Take 1 tablet by mouth 2 (two) times daily with a meal.   Yes [provider]  tamsulosin (FLOMAX) 0.4 MG CAPS capsule Take 0.4 mg by mouth daily. 07/07/20  Yes [provider]  Fluticasone-Salmeterol (ADVAIR) 250-50 MCG/DOSE AEPB Inhale 1 puff into the lungs 2 (two) times daily. Patient not taking: No sig reported    [provider]  furosemide (LASIX) 20 MG tablet Take 20 mg by mouth daily. Patient not taking: Reported on 02/11/2021 12/06/19   [provider]  nitroGLYCERIN (NITROSTAT) 0.4 MG SL tablet PLACE 1 TABLET UNDER TONGUE EVERY 5 MIN AS NEEDED FOR CHEST PAIN IF NO RELIEF IN15  MIN CALL 911 (MAX 3 TABS) Patient taking differently: Place 0.4 mg under the tongue every 5 (five) minutes as needed for chest pain. IF NO RELIEF IN15 MIN CALL 911 (MAX 3 TABS) 06/29/19   Isaiah Serge, NP    Allergies Lisinopril  Family History  Problem Relation Age of Onset   Heart attack Mother    Melanoma Father    Parkinsonism Father     Social History Social History   Tobacco Use   Smoking status: Former    Packs/day: 1.00    Years: 50.00    Pack years: 50.00    Types: Cigarettes    Quit date: 08/2017    Years since quitting: 3.5   Smokeless tobacco: Never   Tobacco comments:    Has Quit as of May 2019- reviewed relaspe and will continue to followup  Vaping Use   Vaping Use: Never used  Substance Use Topics   Alcohol use: No   Drug use: No    Review of Systems Constitutional: No fever. Eyes: No visual changes. ENT: No sore throat. Cardiovascular: + chest pain. Respiratory: Denies shortness of breath. Gastrointestinal: No nausea, vomiting, diarrhea. Genitourinary: Negative for dysuria. Musculoskeletal: Negative for back pain. Skin: Negative for rash. Neurological: Negative for focal weakness or numbness.  ____________________________________________   PHYSICAL EXAM:  VITAL SIGNS: ED Triage Vitals  Enc Vitals Group     BP 02/10/21 1536 111/90     Pulse Rate 02/10/21 1536 91     Resp 02/10/21 1536 18     Temp 02/10/21 1536 98.3 F (36.8 C)     Temp Source 02/10/21 1536 Oral     SpO2 02/10/21 1536 (!) 89 %     Weight 02/10/21 1537 230 lb (104.3 kg)     Height 02/10/21 1537 6' (1.829 m)     Head Circumference --      Peak Flow --      Pain Score 02/10/21 1537 8     Pain Loc --      Pain Edu? --      Excl. in [place]? --    CONSTITUTIONAL: Alert and oriented and responds appropriately to questions. Well-appearing; well-nourished, elderly, nontoxic, afebrile HEAD: Normocephalic EYES: Conjunctivae clear, pupils appear equal, EOM appear intact ENT:  normal nose; moist mucous membranes NECK: Supple, normal ROM CARD: Regular and tachycardic; S1 and S2 appreciated; no murmurs, no clicks, no rubs, no gallops RESP: Normal chest excursion without splinting or tachypnea; breath sounds clear and equal bilaterally; no wheezes, no rhonchi, no rales, no hypoxia or respiratory distress, speaking full sentences ABD/GI: Normal bowel sounds; non-distended; soft, non-tender, no rebound, no guarding, no peritoneal signs, no hepatosplenomegaly BACK: The back appears normal EXT: Normal  ROM in all joints; no deformity noted, no edema; no cyanosis, no calf tenderness or calf swelling SKIN: Normal color for age and race; warm; no rash on exposed skin NEURO: Moves all extremities equally PSYCH: The patient's mood and manner are appropriate.  ____________________________________________   LABS (all labs ordered are listed, but only abnormal results are displayed)  Labs Reviewed  BASIC METABOLIC PANEL - Abnormal; Notable for the following components:      Result Value   Glucose, Bld 198 (*)    Creatinine, Ser 1.32 (*)    GFR, Estimated 56 (*)    All other components within normal limits  CBC WITH DIFFERENTIAL/PLATELET - Abnormal; Notable for the following components:   RDW 16.2 (*)    All other components within normal limits  CBG MONITORING, ED - Abnormal; Notable for the following components:   Glucose-Capillary 212 (*)    All other components within normal limits  RESP PANEL BY RT-PCR (FLU A&B, COVID) ARPGX2  BRAIN NATRIURETIC PEPTIDE  TROPONIN I (HIGH SENSITIVITY)  TROPONIN I (HIGH SENSITIVITY)   ____________________________________________  EKG   EKG Interpretation  Date/Time:  Saturday February 10 2021 15:31:05 EST Ventricular Rate:  104 PR Interval:  158 QRS Duration: 88 QT Interval:  330 QTC Calculation: 433 R Axis:   93 Text Interpretation: Sinus tachycardia with Premature atrial complexes Rightward axis Nonspecific ST abnormality  Abnormal ECG Confirmed by Pryor Curia [phone]) on 02/11/2021 6:31:34 AM        ____________________________________________  RADIOLOGY Jessie Foot Terius Jacuinde, personally viewed and evaluated these images (plain radiographs) as part of my medical decision making, as well as reviewing the written report by the radiologist.  ED MD interpretation: CT scan shows no PE, infiltrate or edema.  Official radiology report(s): DG Chest 2 View  Result Date: 02/10/2021 CLINICAL DATA:  Chest pain. EXAM: CHEST - 2 VIEW COMPARISON:  October 20, 2020 FINDINGS: Tortuosity of the aorta and calcific atherosclerotic disease. Cardiomediastinal silhouette is normal. Mediastinal contours appear intact. No new lobar consolidation. Persistent thickening of the right pleura with streaky interstitial opacities in the right mid thorax and lingula. The findings are relatively stable from July 2022. Osseous structures are without acute abnormality. Soft tissues are grossly normal. IMPRESSION: 1. No active cardiopulmonary disease. 2. Persistent thickening of the right pleura with streaky interstitial opacities in the right mid thorax and lingula. Electronically Signed   By: Fidela Salisbury M.D.   On: 02/10/2021 17:01   CT Angio Chest PE W and/or Wo Contrast  Result Date: 02/11/2021 CLINICAL DATA:  PE suspected, high prob.  Chest pain.  Cough. EXAM: CT ANGIOGRAPHY CHEST WITH CONTRAST TECHNIQUE: Multidetector CT imaging of the chest was performed using the standard protocol during bolus administration of intravenous contrast. Multiplanar CT image reconstructions and MIPs were obtained to evaluate the vascular anatomy. CONTRAST:  15mL OMNIPAQUE IOHEXOL 350 MG/ML SOLN COMPARISON:  12/24/2019 chest CT angiogram. FINDINGS: Cardiovascular: The study is high quality for the evaluation of pulmonary embolism. There are no filling defects in the central, lobar, segmental or subsegmental pulmonary artery branches to suggest acute pulmonary  embolism. Atherosclerotic nonaneurysmal thoracic aorta. Dilated main pulmonary artery (3.5 cm diameter). Normal heart size. No significant pericardial fluid/thickening. Three-vessel coronary atherosclerosis. Mediastinum/Nodes: No discrete thyroid nodules. Unremarkable esophagus. No pathologically enlarged axillary, mediastinal or hilar lymph nodes. Lungs/Pleura: No pneumothorax. Stable tiny calcified posterior right pleural plaques. No pleural effusions. Mild-to-moderate centrilobular emphysema with mild diffuse bronchial wall thickening. No acute consolidative airspace disease or lung masses. Anterior  right lower lobe 3 mm solid pulmonary nodule (series 6/image 55), stable, considered benign. No new significant pulmonary nodules. Small parenchymal bands in the dependent lung bases bilaterally, unchanged, compatible with nonspecific mild postinfectious/postinflammatory scarring. Upper abdomen: Nonobstructing stones in the upper kidneys bilaterally, largest 7 mm on the right and 8 mm on the left. Subcentimeter hypodense bilateral upper renal cortical lesions are too small to characterize and require no follow-up. Musculoskeletal: No aggressive appearing focal osseous lesions. Mild thoracic spondylosis. Review of the MIP images confirms the above findings. IMPRESSION: 1. No pulmonary embolism. 2. Mild-to-moderate centrilobular emphysema with mild diffuse bronchial wall thickening, suggesting COPD. 3. Dilated main pulmonary artery, suggesting pulmonary arterial hypertension. 4. Three-vessel coronary atherosclerosis. 5. Nonobstructing bilateral nephrolithiasis. 6. Aortic Atherosclerosis (ICD10-I70.0) and Emphysema (ICD10-J43.9). Electronically Signed   By: Ilona Sorrel M.D.   On: 02/11/2021 06:45    ____________________________________________   PROCEDURES  Procedure(s) performed (including Critical Care):  Procedures  CRITICAL CARE Performed by: Cyril Mourning Parneet Glantz   Total critical care time: 45  minutes  Critical care time was exclusive of separately billable procedures and treating other patients.  Critical care was necessary to treat or prevent imminent or life-threatening deterioration.  Critical care was time spent personally by me on the following activities: development of treatment plan with patient and/or surrogate as well as nursing, discussions with consultants, evaluation of patient's response to treatment, examination of patient, obtaining history from patient or surrogate, ordering and performing treatments and interventions, ordering and review of laboratory studies, ordering and review of radiographic studies, pulse oximetry and re-evaluation of patient's condition.  ____________________________________________   INITIAL IMPRESSION / ASSESSMENT AND PLAN / ED COURSE  As part of my medical decision making, I reviewed the following data within the [place] notes reviewed and incorporated, Labs reviewed , EKG interpreted , Old EKG reviewed, Old chart reviewed, Radiograph reviewed , Discussed with admitting physician , CT reviewed, and Notes from prior ED visits         Patient here with complaint of chest pain that has now resolved.  Does have a significant cardiac history.  EKG shows some nonspecific ST changes diffusely but this looks similar to a previous EKG in September 2021.  He states that this feels similar to his previous heart attack but also somewhat different.  No sign of STEMI today.  Troponin x2 negative.  Does have history of ischemic cardiomyopathy.  We will add on a BNP.  He is also hypoxic here on room air and does not wear oxygen chronically.  Does have history of COPD although he denies this.  States he quit smoking in 2019.  Doing well on 2 L nasal cannula.  Chest x-ray reviewed by myself and radiology shows no acute abnormality.  He does have chronic findings that are stable since July 2022.  Lungs are currently clear to  auscultation with good aeration.  Discussed with patient that I am concerned that this could be CHF, PE that is not seen on chest x-ray.  We will proceed with CTA of the chest.  Patient will need admission given new oxygen requirement.  Will give 1 DuoNeb treatment here.    ED PROGRESS  CT scan shows no PE, infiltrate or edema.  BNP is normal.  CT suggestive of emphysema, COPD.  We will continue breathing treatments and give Solu-Medrol.  Will discuss with hospitalist for admission for respiratory failure with hypoxia not on oxygen at home.   7:16 AM Discussed patient's case  with hospitalist, Dr. Blaine Hamper.  I have recommended admission and patient (and family if present) agree with this plan. Admitting physician will place admission orders.   I reviewed all nursing notes, vitals, pertinent previous records and reviewed/interpreted all EKGs, lab and urine results, imaging (as available).  ____________________________________________   FINAL CLINICAL IMPRESSION(S) / ED DIAGNOSES  Final diagnoses:  Acute respiratory failure with hypoxia ([place])  Chronic obstructive pulmonary disease, unspecified COPD type [facility])     ED Discharge Orders     None       *Please note:  Henry [name] was evaluated in Emergency Department on 02/11/2021 for the symptoms described in the history of present illness. He was evaluated in the context of the global COVID-19 pandemic, which necessitated consideration that the patient might be at risk for infection with the SARS-CoV-2 virus that causes COVID-19. Institutional protocols and algorithms that pertain to the evaluation of patients at risk for COVID-19 are in a state of rapid change based on information released by regulatory bodies including the CDC and federal and state organizations. These policies and algorithms were followed during the patient's care in the ED.  Some ED evaluations and interventions may be delayed as a result of limited staffing during and the  pandemic.*   Note:  This document was prepared using Dragon voice recognition software and may include unintentional dictation errors.    Diandre Merica, Delice Bison, DO 02/11/21 [phone]

## 2021-02-11 NOTE — ED Notes (Signed)
Alert and oriented x 4. No complaints of chest pain. No complaints of shortness of breath. VSS. Side rails up. Call bell in reach.

## 2021-02-11 NOTE — ED Notes (Signed)
Complained of mild nausea and shortness of breath after using bathroom. No signs of distress noted. Medicated as ordered.

## 2021-02-11 NOTE — ED Notes (Signed)
Patient transported to CT 

## 2021-02-11 NOTE — ED Notes (Signed)
This RN & Dorian RN respond to call light, pt noted to have increased WOB in notable mild resp distress. Pt again educated via teach back method of risks & non therapeutic actions. Pt expresses understanding. Replaced on oxygen, placed on monitor, VS updated. Breathing tx administered, warm blankets supplied, call light in reach - Denies other needs at this time.

## 2021-02-11 NOTE — ED Notes (Signed)
In room with pt to answer call bell. This RN has answered pts call bell greater than 8x since 7p. RN in room at this time to adjust  P [name] [facility] to higher incline.

## 2021-02-11 NOTE — ED Notes (Signed)
First contact: This RN & Dorian RN at bedside for bedside report. Introduced self, pt demands to remove self off of all monitors & oxygen to walk to restroom. Pt offered urinal, adamantly denies. Pt advised of risks by this RN & previous Therapist, sports. Pt states he does not care. Pt unhooks self from all oxygen & monitors, ambulates to bathroom with IV pole sans assistance. Instructed to use call light when returned to bed.   Pt requests PRN for sleep. MD messaged. Will medicate per MAR.

## 2021-02-11 NOTE — ED Notes (Signed)
Up to bathroom to have BM. Gait steady. No complaints.

## 2021-02-12 ENCOUNTER — Encounter: Payer: Self-pay | Admitting: Internal Medicine

## 2021-02-12 DIAGNOSIS — J441 Chronic obstructive pulmonary disease with (acute) exacerbation: Secondary | ICD-10-CM | POA: Diagnosis not present

## 2021-02-12 LAB — BASIC METABOLIC PANEL
Anion gap: 12 (ref 5–15)
BUN: 25 mg/dL — ABNORMAL HIGH (ref 8–23)
CO2: 23 mmol/L (ref 22–32)
Calcium: 9.5 mg/dL (ref 8.9–10.3)
Chloride: 103 mmol/L (ref 98–111)
Creatinine, Ser: 1.3 mg/dL — ABNORMAL HIGH (ref 0.61–1.24)
GFR, Estimated: 57 mL/min — ABNORMAL LOW (ref >60)
Glucose, Bld: 248 mg/dL — ABNORMAL HIGH (ref 70–99)
Potassium: 4.4 mmol/L (ref 3.5–5.1)
Sodium: 138 mmol/L (ref 135–145)

## 2021-02-12 LAB — LIPID PANEL
Cholesterol: 112 mg/dL (ref 0–200)
HDL: 37 mg/dL — ABNORMAL LOW (ref >40)
LDL Cholesterol: 55 mg/dL (ref 0–99)
Total CHOL/HDL Ratio: 3 RATIO
Triglycerides: 99 mg/dL (ref <150)
VLDL: 20 mg/dL (ref 0–40)

## 2021-02-12 LAB — CBG MONITORING, ED
Glucose-Capillary: 223 mg/dL — ABNORMAL HIGH (ref 70–99)
Glucose-Capillary: 203 mg/dL — ABNORMAL HIGH (ref 70–99)

## 2021-02-12 LAB — RESPIRATORY PANEL BY PCR

## 2021-02-12 LAB — GLUCOSE, CAPILLARY
Glucose-Capillary: 140 mg/dL — ABNORMAL HIGH (ref 70–99)
Glucose-Capillary: 161 mg/dL — ABNORMAL HIGH (ref 70–99)
Glucose-Capillary: 163 mg/dL — ABNORMAL HIGH (ref 70–99)

## 2021-02-12 MED ORDER — MONTELUKAST SODIUM 10 MG PO TABS
10.0000 mg | ORAL_TABLET | Freq: Every day | ORAL | Status: DC
  Administered 2021-02-12 – 2021-02-13 (×2): 10 mg via ORAL
  Filled 2021-02-12 (×2): qty 1

## 2021-02-12 MED ORDER — METOPROLOL TARTRATE 25 MG PO TABS
25.0000 mg | ORAL_TABLET | Freq: Two times a day (BID) | ORAL | Status: DC
  Administered 2021-02-12 – 2021-02-13 (×3): 25 mg via ORAL
  Filled 2021-02-12 (×5): qty 1

## 2021-02-12 MED ORDER — ENOXAPARIN SODIUM 60 MG/0.6ML IJ SOSY
0.5000 mg/kg | PREFILLED_SYRINGE | INTRAMUSCULAR | Status: DC
  Administered 2021-02-12 – 2021-02-13 (×2): 52.5 mg via SUBCUTANEOUS
  Filled 2021-02-12 (×2): qty 0.6

## 2021-02-12 MED ORDER — HYDROCODONE-ACETAMINOPHEN 5-325 MG PO TABS: 1.0000 | ORAL_TABLET | Freq: Four times a day (QID) | ORAL | Status: DC | PRN

## 2021-02-12 MED ORDER — IRBESARTAN 150 MG PO TABS
150.0000 mg | ORAL_TABLET | Freq: Every day | ORAL | Status: DC
  Administered 2021-02-12 – 2021-02-14 (×3): 150 mg via ORAL
  Filled 2021-02-12 (×3): qty 1

## 2021-02-12 MED ORDER — ADULT MULTIVITAMIN W/MINERALS CH
1.0000 | ORAL_TABLET | Freq: Every day | ORAL | Status: DC
  Administered 2021-02-12 – 2021-02-14 (×3): 1 via ORAL
  Filled 2021-02-12 (×3): qty 1

## 2021-02-12 MED ORDER — ASPIRIN EC 81 MG PO TBEC
81.0000 mg | DELAYED_RELEASE_TABLET | Freq: Every day | ORAL | Status: DC
  Administered 2021-02-12 – 2021-02-14 (×3): 81 mg via ORAL
  Filled 2021-02-12 (×3): qty 1

## 2021-02-12 MED ORDER — GABAPENTIN 600 MG PO TABS
600.0000 mg | ORAL_TABLET | Freq: Four times a day (QID) | ORAL | Status: DC
  Administered 2021-02-12 – 2021-02-14 (×9): 600 mg via ORAL
  Filled 2021-02-12 (×9): qty 1

## 2021-02-12 MED ORDER — PREDNISONE 20 MG PO TABS
40.0000 mg | ORAL_TABLET | Freq: Every day | ORAL | Status: DC
  Administered 2021-02-12 – 2021-02-14 (×3): 40 mg via ORAL
  Filled 2021-02-12 (×4): qty 2

## 2021-02-12 MED ORDER — FUROSEMIDE 20 MG PO TABS
20.0000 mg | ORAL_TABLET | Freq: Every day | ORAL | Status: DC
  Administered 2021-02-12 – 2021-02-13 (×2): 20 mg via ORAL
  Filled 2021-02-12 (×3): qty 1

## 2021-02-12 MED ORDER — LORATADINE 10 MG PO TABS
10.0000 mg | ORAL_TABLET | Freq: Every day | ORAL | Status: DC
  Administered 2021-02-12 – 2021-02-14 (×3): 10 mg via ORAL
  Filled 2021-02-12 (×3): qty 1

## 2021-02-12 MED ORDER — ATORVASTATIN CALCIUM 20 MG PO TABS
80.0000 mg | ORAL_TABLET | Freq: Every day | ORAL | Status: DC
  Administered 2021-02-12 – 2021-02-14 (×3): 80 mg via ORAL
  Filled 2021-02-12 (×3): qty 4

## 2021-02-12 MED ORDER — INSULIN GLARGINE-YFGN 100 UNIT/ML ~~LOC~~ SOLN
60.0000 [IU] | Freq: Two times a day (BID) | SUBCUTANEOUS | Status: DC
  Administered 2021-02-12 – 2021-02-14 (×5): 60 [IU] via SUBCUTANEOUS
  Filled 2021-02-12 (×7): qty 0.6

## 2021-02-12 NOTE — Progress Notes (Signed)
PROGRESS NOTE    Henry [name]  ZYS:[id number] DOB: January 22, 1946 DOA: 02/11/2021 PCP: Juluis Pitch, MD   Chief Complain: Shortness of breath  Brief Narrative: Patient is a 34-year male with history of hypertension, hyperlipidemia, diabetes type 2, COPD, coronary artery disease, status post stent placement, CKD stage III, skin cancer who presented with shortness of breath.  He was progressively dyspneic for over a week, had cough, no reported fever or chills.  On presentation his oxygenation was 88-89% on room air, improved to 97% on 2 L.  COVID screen test was negative.  Chest x-ray showed streaky interstitial opacity.  CT angiogram negative for PE.  Patient was admitted for the management of COPD exacerbation.  Assessment & Plan:   Principal Problem:   COPD exacerbation ([facility]) Active Problems:   HTN (hypertension)   HLD (hyperlipidemia)   CAD in native artery   Acute respiratory failure with hypoxia (HCC)   Chest pain   CKD (chronic kidney disease), stage IIIa   Severe sepsis (H[facility])   Acute  hypoxic respiratory failure secondary COPD exacerbation: Presented with dyspnea, cough.  Desaturated on room air on presentation. Being  treated for COPD exacerbation.  Started on steroid, anticough medication.  Currently on 2 L of oxygen per minute.  Not on oxygen at home.  We will try to wean the oxygen.  Continue incentive spirometer.  Suspected sepsis: Was tachycardic, tachypneic, had elevated lactate on presentation.  Source of infection unclear.  Currently on Z-Pak.  Follow-up blood cultures.  Sepsis physiology has improved  Hypertension: Continue irbesartan, metoprolol and monitor blood pressure  Hyperlipidemia: On Lipitor  Coronary artery disease: Currently without anginal symptoms.  Troponin is normal.  On aspirin, Lipitor, metoprolol at home.  CKD stage III: Currently stable  Obesity:BMI of 31.1  Deconditioning: PT/OT recommended home health on discharge         DVT  prophylaxis: Lovenox Code Status: Full code Family Communication: Family friend at the bedside Status is: Inpatient  Remains inpatient appropriate because:Presented with hypoxia requiring oxygen supplementation and IV steroid      Consultants: None  Procedures: None  Antimicrobials:  Anti-infectives (From admission, onward)    Start     Dose/Rate Route Frequency Ordered Stop   02/12/21 1000  azithromycin (ZITHROMAX) tablet 250 mg       See Hyperspace for full Linked Orders Report.   250 mg Oral Daily 02/11/21 1330 02/16/21 0959   02/11/21 1400  azithromycin (ZITHROMAX) tablet 500 mg       See Hyperspace for full Linked Orders Report.   500 mg Oral Daily 02/11/21 1330 02/11/21 1356       Subjective:  Patient seen and examined the bedside this morning.  Hemodynamically stable, lying on bed.  He was coughing.  On 2 L of oxygen per minute.  He says he is feeling better slowly.  Objective: Vitals:   02/12/21 0430 02/12/21 0500 02/12/21 0600 02/12/21 0630  BP: 124/67 123/77 116/71 116/72  Pulse: (!) 46 (!) 103 (!) 106 97  Resp: 20  (!) 21 19  Temp:      TempSrc:      SpO2: (!) 89% 92% 91% 92%  Weight:      Height:        Intake/Output Summary (Last 24 hours) at 02/12/2021 0840 Last data filed at 02/12/2021 0120 Gross per 24 hour  Intake 2000 ml  Output 200 ml  Net 1800 ml   Filed Weights   02/10/21 1537  Weight:  104.3 kg    Examination:  General exam: Overall comfortable, not in distress, obese, HEENT: PERRL Respiratory system: Diminished wheezing bilaterally, no wheezes or crackles  Cardiovascular system: S1 & S2 heard, RRR.  Gastrointestinal system: Abdomen is nondistended, soft and nontender. Central nervous system: Alert and oriented Extremities: Trace bilateral lower extremity, no clubbing ,no cyanosis Skin: No rashes, no ulcers,no icterus      Data Reviewed: I have personally reviewed following labs and imaging studies  CBC: Recent Labs  Lab  02/10/21 1548  WBC 7.6  NEUTROABS 4.4  HGB 16.2  HCT 50.8  MCV 88.0  PLT 947   Basic Metabolic Panel: Recent Labs  Lab 02/10/21 1548  NA 137  K 4.4  CL 100  CO2 27  GLUCOSE 198*  BUN 19  CREATININE 1.32*  CALCIUM 10.2   GFR: Estimated Creatinine Clearance: 60.4 mL/min (A) (by C-G formula based on SCr of 1.32 mg/dL (H)). Liver Function Tests: No results for input(s): AST, ALT, ALKPHOS, BILITOT, PROT, ALBUMIN in the last 168 hours. No results for input(s): LIPASE, AMYLASE in the last 168 hours. No results for input(s): AMMONIA in the last 168 hours. Coagulation Profile: No results for input(s): INR, PROTIME in the last 168 hours. Cardiac Enzymes: No results for input(s): CKTOTAL, CKMB, CKMBINDEX, TROPONINI in the last 168 hours. BNP (last 3 results) No results for input(s): PROBNP in the last 8760 hours. HbA1C: Recent Labs    02/11/21 1157  HGBA1C 7.9*   CBG: Recent Labs  Lab 02/11/21 0911 02/11/21 1154 02/11/21 1750 02/11/21 2118 02/12/21 0749  GLUCAP 220* 203* 231* 232* 223*   Lipid Profile: Recent Labs    02/12/21 0616  CHOL 112  HDL 37*  LDLCALC 55  TRIG 99  CHOLHDL 3.0   Thyroid Function Tests: No results for input(s): TSH, T4TOTAL, FREET4, T3FREE, THYROIDAB in the last 72 hours. Anemia Panel: No results for input(s): VITAMINB12, FOLATE, FERRITIN, TIBC, IRON, RETICCTPCT in the last 72 hours. Sepsis Labs: Recent Labs  Lab 02/11/21 1157 02/11/21 1201 02/11/21 1350  PROCALCITON <0.10  --   --   LATICACIDVEN  --  1.8 2.9*    Recent Results (from the past 240 hour(s))  Resp Panel by RT-PCR (Flu A&B, Covid) Nasopharyngeal Swab     Status: None   Collection Time: 02/10/21  3:48 PM   Specimen: Nasopharyngeal Swab; Nasopharyngeal(NP) swabs in vial transport medium  Result Value Ref Range Status   SARS Coronavirus 2 by RT PCR NEGATIVE NEGATIVE Final    Comment: (NOTE) SARS-CoV-2 target nucleic acids are NOT DETECTED.  The SARS-CoV-2 RNA is  generally detectable in upper respiratory specimens during the acute phase of infection. The lowest concentration of SARS-CoV-2 viral copies this assay can detect is 138 copies/mL. A negative result does not preclude SARS-Cov-2 infection and should not be used as the sole basis for treatment or other patient management decisions. A negative result may occur with  improper specimen collection/handling, submission of specimen other than nasopharyngeal swab, presence of viral mutation(s) within the areas targeted by this assay, and inadequate number of viral copies(<138 copies/mL). A negative result must be combined with clinical observations, patient history, and epidemiological information. The expected result is Negative.  Fact Sheet for Patients:  EntrepreneurPulse.com.au  Fact Sheet for Healthcare Providers:  IncredibleEmployment.be  This test is no t yet approved or cleared by the [place] FDA and  has been authorized for detection and/or diagnosis of SARS-CoV-2 by FDA under an Emergency Use Authorization (EUA).  This EUA will remain  in effect (meaning this test can be used) for the duration of the COVID-19 declaration under Section 564(b)(1) of the Act, 21 U.S.C.section 360bbb-3(b)(1), unless the authorization is terminated  or revoked sooner.       Influenza A by PCR NEGATIVE NEGATIVE Final   Influenza B by PCR NEGATIVE NEGATIVE Final    Comment: (NOTE) The Xpert Xpress SARS-CoV-2/FLU/RSV plus assay is intended as an aid in the diagnosis of influenza from Nasopharyngeal swab specimens and should not be used as a sole basis for treatment. Nasal washings and aspirates are unacceptable for Xpert Xpress SARS-CoV-2/FLU/RSV testing.  Fact Sheet for Patients: EntrepreneurPulse.com.au  Fact Sheet for Healthcare Providers: IncredibleEmployment.be  This test is not yet approved or cleared by the [place] FDA and has been authorized for detection and/or diagnosis of SARS-CoV-2 by FDA under an Emergency Use Authorization (EUA). This EUA will remain in effect (meaning this test can be used) for the duration of the COVID-19 declaration under Section 564(b)(1) of the Act, 21 U.S.C. section 360bbb-3(b)(1), unless the authorization is terminated or revoked.  Performed at [facility], [address], [place], [place] [address]          Radiology Studies: DG Chest 2 View  Result Date: 02/10/2021 CLINICAL DATA:  Chest pain. EXAM: CHEST - 2 VIEW COMPARISON:  October 20, 2020 FINDINGS: Tortuosity of the aorta and calcific atherosclerotic disease. Cardiomediastinal silhouette is normal. Mediastinal contours appear intact. No new lobar consolidation. Persistent thickening of the right pleura with streaky interstitial opacities in the right mid thorax and lingula. The findings are relatively stable from July 2022. Osseous structures are without acute abnormality. Soft tissues are grossly normal. IMPRESSION: 1. No active cardiopulmonary disease. 2. Persistent thickening of the right pleura with streaky interstitial opacities in the right mid thorax and lingula. Electronically Signed   By: Fidela Salisbury M.D.   On: 02/10/2021 17:01   CT Angio Chest PE W and/or Wo Contrast  Result Date: 02/11/2021 CLINICAL DATA:  PE suspected, high prob.  Chest pain.  Cough. EXAM: CT ANGIOGRAPHY CHEST WITH CONTRAST TECHNIQUE: Multidetector CT imaging of the chest was performed using the standard protocol during bolus administration of intravenous contrast. Multiplanar CT image reconstructions and MIPs were obtained to evaluate the vascular anatomy. CONTRAST:  27mL OMNIPAQUE IOHEXOL 350 MG/ML SOLN COMPARISON:  12/24/2019 chest CT angiogram. FINDINGS: Cardiovascular: The study is high quality for the evaluation of pulmonary embolism. There are no filling defects in the central, lobar, segmental or  subsegmental pulmonary artery branches to suggest acute pulmonary embolism. Atherosclerotic nonaneurysmal thoracic aorta. Dilated main pulmonary artery (3.5 cm diameter). Normal heart size. No significant pericardial fluid/thickening. Three-vessel coronary atherosclerosis. Mediastinum/Nodes: No discrete thyroid nodules. Unremarkable esophagus. No pathologically enlarged axillary, mediastinal or hilar lymph nodes. Lungs/Pleura: No pneumothorax. Stable tiny calcified posterior right pleural plaques. No pleural effusions. Mild-to-moderate centrilobular emphysema with mild diffuse bronchial wall thickening. No acute consolidative airspace disease or lung masses. Anterior right lower lobe 3 mm solid pulmonary nodule (series 6/image 55), stable, considered benign. No new significant pulmonary nodules. Small parenchymal bands in the dependent lung bases bilaterally, unchanged, compatible with nonspecific mild postinfectious/postinflammatory scarring. Upper abdomen: Nonobstructing stones in the upper kidneys bilaterally, largest 7 mm on the right and 8 mm on the left. Subcentimeter hypodense bilateral upper renal cortical lesions are too small to characterize and require no follow-up. Musculoskeletal: No aggressive appearing focal osseous lesions. Mild thoracic spondylosis. Review of the MIP images confirms the  above findings. IMPRESSION: 1. No pulmonary embolism. 2. Mild-to-moderate centrilobular emphysema with mild diffuse bronchial wall thickening, suggesting COPD. 3. Dilated main pulmonary artery, suggesting pulmonary arterial hypertension. 4. Three-vessel coronary atherosclerosis. 5. Nonobstructing bilateral nephrolithiasis. 6. Aortic Atherosclerosis (ICD10-I70.0) and Emphysema (ICD10-J43.9). Electronically Signed   By: Ilona Sorrel M.D.   On: 02/11/2021 06:45        Scheduled Meds:  aspirin EC  81 mg Oral Daily   atorvastatin  80 mg Oral Daily   azithromycin  250 mg Oral Daily   enoxaparin (LOVENOX)  injection  0.5 mg/kg Subcutaneous Q24H   gabapentin  600 mg Oral QID   insulin aspart  0-5 Units Subcutaneous QHS   insulin aspart  0-9 Units Subcutaneous TID WC   insulin glargine-yfgn  60 Units Subcutaneous BID   ipratropium-albuterol  3 mL Nebulization Q4H   irbesartan  150 mg Oral Daily   loratadine  10 mg Oral Daily   methylPREDNISolone (SOLU-MEDROL) injection  80 mg Intravenous Q24H   metoprolol tartrate  25 mg Oral BID   montelukast  10 mg Oral QHS   multivitamin with minerals  1 tablet Oral Daily   Continuous Infusions:  sodium chloride 75 mL/hr at 02/12/21 0820     LOS: 1 day    Time spent: 35 mins.More than 50% of that time was spent in counseling and/or coordination of care.      Shelly Coss, MD [facility] P11/10/2020, 8:40 AM

## 2021-02-12 NOTE — ED Notes (Signed)
This Rn responds to call light. Pt requesting to walk to bathroom. This RN again stresses the importance of staying in bed on monitor & oxygen. Offered primofit. Pt agreeable to primofit. Able to void 158XE urine sans complications.

## 2021-02-12 NOTE — Progress Notes (Signed)
Pt arrived to room 2C09 from the ED. Received report from Merrilyn Puma, RN. See assessment. Will continue to monitor.

## 2021-02-12 NOTE — ED Notes (Signed)
Informed RN bed assigned 

## 2021-02-12 NOTE — Plan of Care (Signed)
  Problem: Education: Goal: Knowledge of General Education information will improve Description: Including pain rating scale, medication(s)/side effects and non-pharmacologic comfort measures Outcome: Progressing   Problem: Health Behavior/Discharge Planning: Goal: Ability to manage health-related needs will improve Outcome: Progressing   Problem: Clinical Measurements: Goal: Ability to maintain clinical measurements within normal limits will improve Outcome: Progressing Goal: Respiratory complications will improve Outcome: Progressing   Problem: Activity: Goal: Risk for activity intolerance will decrease Outcome: Progressing   Problem: Elimination: Goal: Will not experience complications related to bowel motility Outcome: Progressing Goal: Will not experience complications related to urinary retention Outcome: Progressing   Problem: Pain Managment: Goal: General experience of comfort will improve Outcome: Progressing   Problem: Safety: Goal: Ability to remain free from injury will improve Outcome: Progressing   Problem: Skin Integrity: Goal: Risk for impaired skin integrity will decrease Outcome: Progressing

## 2021-02-12 NOTE — ED Notes (Signed)
Pt in supine position, asleep with primofit in place - draining well.   Call light in reach.

## 2021-02-12 NOTE — ED Notes (Signed)
This RN assisted pt to bedside commode on 2L O2. Pt reported SHOB, O2 Sats 88-92 at the time.

## 2021-02-12 NOTE — Evaluation (Signed)
Occupational Therapy Evaluation Patient Details Name: Henry [name] MRN: [id number] DOB: 1946/03/07 Today's Date: 02/12/2021   History of Present Illness Leovanni Bjorkman is a 89yoF who comes to [facility] on 02/10/21 c CP, SOB, cough.   PMH: HTN, HLD, DM, COPD, CAD s/p stent, CKD3a. BNP/HS troponin both unremarkable. Pt admitted with COPD exacerbation.   Clinical Impression   Pt seen for OT evaluation this date. Prior to admission, pt was independent with ADLs/IADLs, occasionally using SPC for functional mobility, and living in a 1-story home alone. At baseline, pt plays golf and drives. Pt denies using supplemental O2 at baseline. Upon arrival to room, pt on 2L/min of supplemental O2 via [place]. Pt currently requires SUPERVISION for bed mobility, SUPERVISION for seated LB dressing, MIN GUARD for functional mobility of short household distances (~63ft) with RW, and MIN GUARD for toilet transfers (simulated) d/t decreased activity tolerance. While on 2L/min of supplemental O2, SpO2 low 90s while seated EOB and talking. SpO2 desat 81-84% during functional mobility of short household distances with RW (SpO2 able to increase to 92% within 30sec of activity cessation and implementation of PLB). Pt educated on energy conservation strategies, including activity pacing and purse lip breathing; pt verbalized understanding. Pt would benefit from additional skilled OT services to maximize return to PLOF and minimize risk of future falls, injury, caregiver burden, and readmission. Upon discharge, recommend [place] services.         Recommendations for follow up therapy are one component of a multi-disciplinary discharge planning process, led by the attending physician.  Recommendations may be updated based on patient status, additional functional criteria and insurance authorization.   Follow Up Recommendations  Home health OT    Assistance Recommended at Discharge Intermittent Supervision/Assistance  Functional  Status Assessment  Patient has had a recent decline in their functional status and demonstrates the ability to make significant improvements in function in a reasonable and predictable amount of time.  Equipment Recommendations  None recommended by OT       Precautions / Restrictions Precautions Precautions: Fall  Restrictions Weight Bearing Restrictions: No      Mobility Bed Mobility Overal bed mobility: Needs Assistance Bed Mobility: Supine to Sit;Sit to Supine     Supine to sit: Supervision;HOB elevated Sit to supine: Supervision;HOB elevated   General bed mobility comments: Requires increased time/effort    Transfers Overall transfer level: Needs assistance Equipment used: Rolling walker (2 wheels) Transfers: Sit to/from Stand Sit to Stand: Min guard;From elevated surface           General transfer comment: MIN GUARD d/t unsteadiness upon rising. Requires verbal cues for safe hand placement with RW use      Balance Overall balance assessment: Needs assistance Sitting-balance support: No upper extremity supported;Feet unsupported Sitting balance-Leahy Scale: Good Sitting balance - Comments: Good balance at EOB during seated LB dressing   Standing balance support: Bilateral upper extremity supported;During functional activity Standing balance-Leahy Scale: Poor Standing balance comment: Requires MIN GUARD and b/l UE support from RW d/t fatigue                           ADL either performed or assessed with clinical judgement   ADL Overall ADL's : Needs assistance/impaired                                       General  ADL Comments: SET-UP assist for seated UB ADLs at EOB. SUPERVISION for seated LB dressing. MIN GUARD for toilet transfers (simulated)     Vision Baseline Vision/History: 1 Wears glasses Patient Visual Report: No change from baseline              Pertinent Vitals/Pain Pain Assessment: No/denies pain     Hand  Dominance Right   Extremity/Trunk Assessment Upper Extremity Assessment Upper Extremity Assessment: Overall WFL for tasks assessed   Lower Extremity Assessment Lower Extremity Assessment: Overall WFL for tasks assessed       Communication Communication Communication: No difficulties   Cognition Arousal/Alertness: Awake/alert Behavior During Therapy: WFL for tasks assessed/performed Overall Cognitive Status: Within Functional Limits for tasks assessed                                       General Comments  While on 2L/min of supplemental O2, SpO2 low 90s while seated EOB and talking. SpO2 desat 81-84% during functional mobility of short household distances (86ft) with RW. SpO2 able to increase to 92% within 30sec of activity cessation and implementation of PLB    Exercises Other Exercises Other Exercises: Education on energy conservation strategies, including activity pacing and purse lip breathing. Pt verbalize understanding        Home Living Family/patient expects to be discharged to:: Private residence Living Arrangements: Alone Available Help at Discharge: Family Type of Home: House Home Access: Stairs to enter Technical brewer of Steps: 3 Entrance Stairs-Rails: Right Home Layout: One level     Bathroom Shower/Tub: Tub/shower unit         Home Equipment: [place] - single point;Grab bars - tub/shower          Prior Functioning/Environment               Mobility Comments: Pt reports being independent with functional mobility, occassionally using SPC ADLs Comments: Pt reports being independent with ADLs/IADLs. Pt reports that he is active; drives and plays golf        OT Problem List: Decreased activity tolerance;Impaired balance (sitting and/or standing);Decreased knowledge of precautions;Cardiopulmonary status limiting activity      OT Treatment/Interventions: Self-care/ADL training;Therapeutic exercise;Energy conservation;DME  and/or AE instruction;Therapeutic activities;Patient/family education;Balance training    OT Goals(Current goals can be found in the care plan section) Acute Rehab OT Goals Patient Stated Goal: to return home OT Goal Formulation: With patient Time For Goal Achievement: 02/26/21 Potential to Achieve Goals: Good ADL Goals Pt Will Perform Grooming: with modified independence;standing (engaging in standing grooming tasks for >8 mins) Pt Will Perform Lower Body Bathing: with set-up;with supervision;sit to/from stand Pt Will Transfer to Toilet: with modified independence;ambulating;regular height toilet  OT Frequency: Min 2X/week    AM-PAC OT "6 Clicks" Daily Activity     Outcome Measure Help from another person eating meals?: None Help from another person taking care of personal grooming?: A Little Help from another person toileting, which includes using toliet, bedpan, or urinal?: A Little Help from another person bathing (including washing, rinsing, drying)?: A Lot Help from another person to put on and taking off regular upper body clothing?: None Help from another person to put on and taking off regular lower body clothing?: A Little 6 Click Score: 19   End of Session Equipment Utilized During Treatment: Rolling walker (2 wheels) Nurse Communication: Mobility status  Activity Tolerance: Patient tolerated treatment well Patient left: in  bed;with call bell/phone within reach  OT Visit Diagnosis: Muscle weakness (generalized) (M62.81);Unsteadiness on feet (R26.81)                Time: 3276-1470 OT Time Calculation (min): 23 min Charges:  OT General Charges $OT Visit: 1 Visit OT Evaluation $OT Eval Moderate Complexity: 1 Mod OT Treatments $Therapeutic Activity: 8-22 mins  Fredirick Maudlin, OTR/L [place]

## 2021-02-12 NOTE — ED Notes (Signed)
Pt c/o nausea, no emesis at this time. Pt given zofran.

## 2021-02-12 NOTE — Evaluation (Signed)
Physical Therapy Evaluation Patient Details Name: Henry [name] MRN: [id number] DOB: [birth year] Today's Date: 02/12/2021  History of Present Illness  Henry [name] is a 76yoF who comes to [facility] on 02/10/21 c CP, SOB, cough.   PMH: HTN, HLD, DM, COPD, CAD s/p stent, CKD3a. BNP/HS troponin both unremarkable. Pt admitted with COPD exacerbation.  Clinical Impression  Pt admitted with above diagnosis. Pt currently with functional limitations due to the deficits listed below (see "PT Problem List"). Upon entry, pt in bed, awake and agreeable to participate. The pt is alert, pleasant, interactive, and able to provide info regarding prior level of function, both in tolerance and independence. Pt on 2L/min satting in low 90s resting, high 80s when up moving, no concernign dyspnea. Pt in AF with tachycardia in low 100s resting, frequent spikes into 120s with exertion, then while up x2 minutes does get into 130s, at which point activity is ceased. Pt has new balance issue with decreased proprioceptive awareness and posterior lean with difficulty correcting. Patient's performance this date reveals decreased ability, independence, and tolerance in performing all basic mobility required for performance of activities of daily living. Pt requires additional DME, close physical assistance, and cues for safe participate in mobility. Pt will benefit from skilled PT intervention to increase independence and safety with basic mobility in preparation for discharge to the venue listed below.          Recommendations for follow up therapy are one component of a multi-disciplinary discharge planning process, led by the attending physician.  Recommendations may be updated based on patient status, additional functional criteria and insurance authorization.  Follow Up Recommendations Home health PT    Assistance Recommended at Discharge Intermittent Supervision/Assistance  Functional Status Assessment Patient has had a  recent decline in their functional status and demonstrates the ability to make significant improvements in function in a reasonable and predictable amount of time.  Equipment Recommendations  Rolling walker (2 wheels);BSC/3in1    Recommendations for Other Services       Precautions / Restrictions Precautions Precautions: Fall Precaution Comments: retro pulsive leaning throughout while up Restrictions Weight Bearing Restrictions: No      Mobility  Bed Mobility Overal bed mobility: Needs Assistance Bed Mobility: Supine to Sit;Sit to Supine     Supine to sit: Min guard Sit to supine: Supervision   General bed mobility comments: labored and weak    Transfers Overall transfer level: Needs assistance Equipment used: None Transfers: Sit to/from Stand Sit to Stand: From elevated surface           General transfer comment: unsteadiness upon rising    Ambulation/Gait   Gait Distance (Feet): 20 Feet Assistive device: None Gait Pattern/deviations: Step-to pattern       General Gait Details: slow AMB/retro stepping at bedside, difficulty with near-continuous retropulsion in standing, requires intermittent min-modA trunk support for correction (on 2L/min SpO2 89%, HR 120-134bpm.)  Stairs            Wheelchair Mobility    Modified Rankin (Stroke Patients Only)       Balance Overall balance assessment: Needs assistance Sitting-balance support: Single extremity supported;Feet supported Sitting balance-Leahy Scale: Fair     Standing balance support: Single extremity supported;During functional activity Standing balance-Leahy Scale: Poor                               Pertinent Vitals/Pain Pain Assessment: No/denies pain    Home Living  Family/patient expects to be discharged to:: Private residence Living Arrangements: Alone Available Help at Discharge: Family Type of Home: House Home Access: Stairs to enter Entrance Stairs-Rails:  Right Entrance Stairs-Number of Steps: 3   Home Layout: One level Home Equipment: [place] - single point      Prior Function               Mobility Comments: community AMB ADLs Comments: independent, lives alone drives     Hand Dominance   Dominant Hand: Right    Extremity/Trunk Assessment   Upper Extremity Assessment Upper Extremity Assessment: Overall WFL for tasks assessed;Generalized weakness    Lower Extremity Assessment Lower Extremity Assessment: Overall WFL for tasks assessed;Generalized weakness       Communication      Cognition Arousal/Alertness: Awake/alert Behavior During Therapy: WFL for tasks assessed/performed Overall Cognitive Status: Within Functional Limits for tasks assessed                                          General Comments      Exercises     Assessment/Plan    PT Assessment Patient needs continued PT services  PT Problem List Decreased strength;Decreased range of motion;Decreased activity tolerance;Decreased balance;Decreased mobility;Decreased safety awareness;Cardiopulmonary status limiting activity       PT Treatment Interventions DME instruction;Balance training;Gait training;Stair training;Functional mobility training;Therapeutic activities;Therapeutic exercise;Patient/family education    PT Goals (Current goals can be found in the Care Plan section)  Acute Rehab PT Goals Patient Stated Goal: regain independence in basic mobility PT Goal Formulation: With patient Time For Goal Achievement: 02/26/21 Potential to Achieve Goals: Good    Frequency Min 2X/week   Barriers to discharge   lives alone    Co-evaluation               AM-PAC PT "6 Clicks" Mobility  Outcome Measure Help needed turning from your back to your side while in a flat bed without using bedrails?: A Little Help needed moving from lying on your back to sitting on the side of a flat bed without using bedrails?: A Little Help  needed moving to and from a bed to a chair (including a wheelchair)?: A Lot Help needed standing up from a chair using your arms (e.g., wheelchair or bedside chair)?: A Lot Help needed to walk in hospital room?: A Lot Help needed climbing 3-5 steps with a railing? : A Lot 6 Click Score: 14    End of Session Equipment Utilized During Treatment: Oxygen Activity Tolerance: Patient tolerated treatment well;Treatment limited secondary to medical complications (Comment) Patient left: in bed;with call bell/phone within reach Nurse Communication: Mobility status PT Visit Diagnosis: Difficulty in walking, not elsewhere classified (R26.2);Other abnormalities of gait and mobility (R26.89)    Time: 5956-3875 PT Time Calculation (min) (ACUTE ONLY): 24 min   Charges:   PT Evaluation $PT Eval Moderate Complexity: 1 Mod PT Treatments $Therapeutic Activity: 8-22 mins       9:37 AM, 02/12/21 Etta Grandchild, PT, DPT Physical Therapist - [facility]  [phone] ([place])    Nour Rodrigues C 02/12/2021, 9:35 AM

## 2021-02-12 NOTE — ED Notes (Signed)
This RN assisted pt with clean linen, peri care, and gown change d/t leaked primofit.

## 2021-02-12 NOTE — ED Notes (Signed)
Pt reports that he did not sleep well last night, feels tired and weak. Resting on bedside commode at this time.

## 2021-02-13 DIAGNOSIS — J441 Chronic obstructive pulmonary disease with (acute) exacerbation: Secondary | ICD-10-CM | POA: Diagnosis not present

## 2021-02-13 LAB — GLUCOSE, CAPILLARY
Glucose-Capillary: 131 mg/dL — ABNORMAL HIGH (ref 70–99)
Glucose-Capillary: 156 mg/dL — ABNORMAL HIGH (ref 70–99)
Glucose-Capillary: 212 mg/dL — ABNORMAL HIGH (ref 70–99)
Glucose-Capillary: 191 mg/dL — ABNORMAL HIGH (ref 70–99)

## 2021-02-13 LAB — BASIC METABOLIC PANEL
Anion gap: 6 (ref 5–15)
BUN: 28 mg/dL — ABNORMAL HIGH (ref 8–23)
CO2: 27 mmol/L (ref 22–32)
Calcium: 9.3 mg/dL (ref 8.9–10.3)
Chloride: 105 mmol/L (ref 98–111)
Creatinine, Ser: 1.23 mg/dL (ref 0.61–1.24)
GFR, Estimated: >60 mL/min (ref >60)
Glucose, Bld: 146 mg/dL — ABNORMAL HIGH (ref 70–99)
Potassium: 4.5 mmol/L (ref 3.5–5.1)
Sodium: 138 mmol/L (ref 135–145)

## 2021-02-13 MED ORDER — IPRATROPIUM-ALBUTEROL 0.5-2.5 (3) MG/3ML IN SOLN
3.0000 mL | Freq: Four times a day (QID) | RESPIRATORY_TRACT | Status: DC
  Administered 2021-02-13 – 2021-02-14 (×4): 3 mL via RESPIRATORY_TRACT
  Filled 2021-02-13 (×4): qty 3

## 2021-02-13 MED ORDER — COVID-19MRNA BIVAL VACC PFIZER 30 MCG/0.3ML IM SUSP
0.3000 mL | Freq: Once | INTRAMUSCULAR | Status: DC
  Filled 2021-02-13: qty 0.3

## 2021-02-13 NOTE — Progress Notes (Signed)
Mobility Specialist - Progress Note   02/13/21 1500  Mobility  Activity Ambulated in room  Level of Assistance Standby assist, set-up cues, supervision of patient - no hands on  Assistive Device None  Distance Ambulated (ft) 40 ft  Mobility Ambulated with assistance in room  Mobility Response Tolerated well  Mobility performed by Mobility specialist  $Mobility charge 1 Mobility    Pre-mobility: 87 HR, 88% SpO2 During mobility: 94 HR, 83% SpO2 Post-mobility: 89 HR, 91% SpO2   Pt lying in bed upon arrival, utilizing 3L. O2 desats with all transfers---difficult to achieve sats > 88% prior to activity. Pt hyper-verbal throughout session. O2 desat to a low of 83% during ambulation, recovered > 88% within ~35 seconds once seated. Pt returned supine with needs in reach.    Kathee Delton Mobility Specialist 02/13/21, 3:11 PM

## 2021-02-13 NOTE — Progress Notes (Signed)
Dr Tawanna Solo said to let telemetry expire.

## 2021-02-13 NOTE — Progress Notes (Signed)
Physical Therapy Treatment Patient Details Name: ABDULKADIR [name] MRN: [id number] DOB: March 13, 1946 Today's Date: 02/13/2021   History of Present Illness Henry [name] is a 52yoF who comes [facility] on 02/10/21 c CP, SOB, cough.   PMH: HTN, HLD, DM, COPD, CAD s/p stent, CKD3a. BNP/HS troponin both unremarkable. Pt admitted with COPD exacerbation.    PT Comments    Pt seen later in day. Focused session on monitoring O2 upon exertion. Pt on 3L [place], 96% at rest, pt however desated after ambulating 18ft with RW while on 3L down to 83% and symptomatic. Pt returned to mid 90's after seated rest break and focus on PLB technique. Continue to recommend HHPT once medically cleared. Continue per POC   Recommendations for follow up therapy are one component of a multi-disciplinary discharge planning process, led by the attending physician.  Recommendations may be updated based on patient status, additional functional criteria and insurance authorization.  Follow Up Recommendations  Home health PT     Assistance Recommended at Discharge Intermittent Supervision/Assistance  Equipment Recommendations  Rolling walker (2 wheels);BSC/3in1    Recommendations for Other Services       Precautions / Restrictions Precautions Precautions: Fall Restrictions Weight Bearing Restrictions: No     Mobility  Bed Mobility Overal bed mobility: Independent Bed Mobility: Supine to Sit;Sit to Supine     Supine to sit: Independent;HOB elevated Sit to supine: Independent;HOB elevated   General bed mobility comments: uses bed rail    Transfers Overall transfer level: Needs assistance Equipment used: Rolling walker (2 wheels) Transfers: Sit to/from Stand Sit to Stand: Supervision           General transfer comment:  (No unsteadiness noted this session)    Ambulation/Gait Ambulation/Gait assistance: Supervision;Min guard Gait Distance (Feet): 85 Feet Assistive device: Rolling walker (2  wheels) Gait Pattern/deviations: Step-through pattern Gait velocity: decreased     General Gait Details:  (Pt on 3L O2 sats dropped to 83% after completion of gait training)   Stairs             Wheelchair Mobility    Modified Rankin (Stroke Patients Only)       Balance Overall balance assessment: Needs assistance Sitting-balance support: No upper extremity supported;Feet unsupported Sitting balance-Leahy Scale: Good Sitting balance - Comments: Good dynamic balance at EOB   Standing balance support: Single extremity supported Standing balance-Leahy Scale: Fair Standing balance comment: 1 hand on countertop for support while standing to brush teeth; unsupported to set up toothbrush                            Cognition Arousal/Alertness: Awake/alert Behavior During Therapy: WFL for tasks assessed/performed Overall Cognitive Status: Within Functional Limits for tasks assessed                                 General Comments:  (Very talkative and pleasant)        Exercises      General Comments General comments (skin integrity, edema, etc.): Pt with SOB upon gait exertion      Pertinent Vitals/Pain Pain Assessment: No/denies pain    Home Living                          Prior Function            PT Goals (current goals  can now be found in the care plan section) Acute Rehab PT Goals Patient Stated Goal: regain independence in basic mobility Progress towards PT goals: Progressing toward goals    Frequency    Min 2X/week      PT Plan Current plan remains appropriate    Co-evaluation              AM-PAC PT "6 Clicks" Mobility   Outcome Measure  Help needed turning from your back to your side while in a flat bed without using bedrails?: A Little Help needed moving from lying on your back to sitting on the side of a flat bed without using bedrails?: A Little Help needed moving to and from a bed to a chair  (including a wheelchair)?: A Little Help needed standing up from a chair using your arms (e.g., wheelchair or bedside chair)?: A Little Help needed to walk in hospital room?: A Little Help needed climbing 3-5 steps with a railing? : A Lot 6 Click Score: 17    End of Session Equipment Utilized During Treatment: Oxygen;Gait belt Activity Tolerance: Patient tolerated treatment well;Treatment limited secondary to medical complications (Comment) (SOB) Patient left: in bed;with call bell/phone within reach Nurse Communication: Mobility status PT Visit Diagnosis: Difficulty in walking, not elsewhere classified (R26.2);Other abnormalities of gait and mobility (R26.89)     Time: 1500-1530 PT Time Calculation (min) (ACUTE ONLY): 30 min  Charges:  $Gait Training: 8-22 mins $Therapeutic Activity: 8-22 mins                    Mikel Cella, PTA    [name] 02/13/2021, 5:25 PM

## 2021-02-13 NOTE — Progress Notes (Signed)
PROGRESS NOTE    Henry [name]  ZOX:[id number] DOB: [birth year] DOA: 02/11/2021 PCP: Juluis Pitch, MD   Chief Complain: Shortness of breath  Brief Narrative: Patient is a 46-year male with history of hypertension, hyperlipidemia, diabetes type 2, COPD, coronary artery disease, status post stent placement, CKD stage III, skin cancer who presented with shortness of breath.  He was progressively dyspneic for over a week, had cough, no reported fever or chills.  On presentation his oxygenation was 88-89% on room air, improved to 97% on 2 L.  COVID screen test was negative.  Chest x-ray showed streaky interstitial opacity.  CT angiogram negative for PE.  Patient was admitted for the management of COPD exacerbation.  Assessment & Plan:   Principal Problem:   COPD exacerbation ([place]) Active Problems:   HTN (hypertension)   HLD (hyperlipidemia)   CAD in native artery   Acute respiratory failure with hypoxia (HCC)   Chest pain   CKD (chronic kidney disease), stage IIIa   Severe sepsis (H[facility])   Acute  hypoxic respiratory failure secondary COPD exacerbation: Presented with dyspnea, cough.  Desaturated on room air on presentation. Being  treated for COPD exacerbation.  Started on steroid, anticough medication.  Currently on 2 L of oxygen per minute.  Not on oxygen at home.  We will try to wean the oxygen.  Continue incentive spirometer.  Continue Z-Pak Respiratory viral panel negative.  Suspected sepsis: Was tachycardic, tachypneic, had elevated lactate on presentation.  Sepsis physiology has improved  Hypertension: Continue irbesartan, metoprolol and monitor blood pressure  Hyperlipidemia: On Lipitor  Coronary artery disease: Currently without anginal symptoms.  Troponin is normal.  On aspirin, Lipitor, metoprolol at home.  CKD stage III: Currently stable  Obesity:BMI of 31.1  Deconditioning: PT/OT recommended home health on discharge         DVT prophylaxis: Lovenox Code  Status: Full code Family Communication: Family friend at the bedside on 02/12/2021 Status is: Inpatient  Remains inpatient appropriate because:Presented with hypoxia requiring oxygen supplementation and IV steroid      Consultants: None  Procedures: None  Antimicrobials:  Anti-infectives (From admission, onward)    Start     Dose/Rate Route Frequency Ordered Stop   02/12/21 1000  azithromycin (ZITHROMAX) tablet 250 mg       See Hyperspace for full Linked Orders Report.   250 mg Oral Daily 02/11/21 1330 02/16/21 0959   02/11/21 1400  azithromycin (ZITHROMAX) tablet 500 mg       See Hyperspace for full Linked Orders Report.   500 mg Oral Daily 02/11/21 1330 02/11/21 1356       Subjective:  Patient seen and examined at the bedside this morning.  He says he feels better today.  He still has some cough and feels heavy in his breathing.  He is still on 2 L/min.  Does not feel ready to go home  Objective: Vitals:   02/12/21 1719 02/12/21 1918 02/12/21 2007 02/13/21 0409  BP: 127/76 122/78  97/62  Pulse: 85 89  69  Resp: 16 20  18   Temp: 98 F (36.7 C) 97.6 F (36.4 C)  97.8 F (36.6 C)  TempSrc:    Oral  SpO2: 95% 91% 92% 94%  Weight:    105.2 kg  Height:        Intake/Output Summary (Last 24 hours) at 02/13/2021 0735 Last data filed at 02/13/2021 0603 Gross per 24 hour  Intake 1516.26 ml  Output 150 ml  Net 1366.26 ml  Filed Weights   02/10/21 1537 02/13/21 0409  Weight: 104.3 kg 105.2 kg    Examination:  General exam: Overall comfortable, not in distress, pleasant male, obese HEENT: PERRL Respiratory system: Mildly diminished air entry bilaterally, no wheezes or crackles  Cardiovascular system: S1 & S2 heard, RRR.  Gastrointestinal system: Abdomen is nondistended, soft and nontender. Central nervous system: Alert and oriented Extremities: Trace bilateral lower extremity edema, no clubbing ,no cyanosis Skin: No rashes, no ulcers,no icterus       Data  Reviewed: I have personally reviewed following labs and imaging studies  CBC: Recent Labs  Lab 02/10/21 1548  WBC 7.6  NEUTROABS 4.4  HGB 16.2  HCT 50.8  MCV 88.0  PLT 935   Basic Metabolic Panel: Recent Labs  Lab 02/10/21 1548 02/12/21 0616 02/13/21 0448  NA 137 138 138  K 4.4 4.4 4.5  CL 100 103 105  CO2 27 23 27   GLUCOSE 198* 248* 146*  BUN 19 25* 28*  CREATININE 1.32* 1.30* 1.23  CALCIUM 10.2 9.5 9.3   GFR: Estimated Creatinine Clearance: 65 mL/min (by C-G formula based on SCr of 1.23 mg/dL). Liver Function Tests: No results for input(s): AST, ALT, ALKPHOS, BILITOT, PROT, ALBUMIN in the last 168 hours. No results for input(s): LIPASE, AMYLASE in the last 168 hours. No results for input(s): AMMONIA in the last 168 hours. Coagulation Profile: No results for input(s): INR, PROTIME in the last 168 hours. Cardiac Enzymes: No results for input(s): CKTOTAL, CKMB, CKMBINDEX, TROPONINI in the last 168 hours. BNP (last 3 results) No results for input(s): PROBNP in the last 8760 hours. HbA1C: Recent Labs    02/11/21 1157  HGBA1C 7.9*   CBG: Recent Labs  Lab 02/12/21 0749 02/12/21 1139 02/12/21 1750 02/12/21 2047 02/12/21 2100  GLUCAP 223* 203* 140* 163* 161*   Lipid Profile: Recent Labs    02/12/21 0616  CHOL 112  HDL 37*  LDLCALC 55  TRIG 99  CHOLHDL 3.0   Thyroid Function Tests: No results for input(s): TSH, T4TOTAL, FREET4, T3FREE, THYROIDAB in the last 72 hours. Anemia Panel: No results for input(s): VITAMINB12, FOLATE, FERRITIN, TIBC, IRON, RETICCTPCT in the last 72 hours. Sepsis Labs: Recent Labs  Lab 02/11/21 1157 02/11/21 1201 02/11/21 1350  PROCALCITON <0.10  --   --   LATICACIDVEN  --  1.8 2.9*    Recent Results (from the past 240 hour(s))  Resp Panel by RT-PCR (Flu A&B, Covid) Nasopharyngeal Swab     Status: None   Collection Time: 02/10/21  3:48 PM   Specimen: Nasopharyngeal Swab; Nasopharyngeal(NP) swabs in vial transport medium   Result Value Ref Range Status   SARS Coronavirus 2 by RT PCR NEGATIVE NEGATIVE Final    Comment: (NOTE) SARS-CoV-2 target nucleic acids are NOT DETECTED.  The SARS-CoV-2 RNA is generally detectable in upper respiratory specimens during the acute phase of infection. The lowest concentration of SARS-CoV-2 viral copies this assay can detect is 138 copies/mL. A negative result does not preclude SARS-Cov-2 infection and should not be used as the sole basis for treatment or other patient management decisions. A negative result may occur with  improper specimen collection/handling, submission of specimen other than nasopharyngeal swab, presence of viral mutation(s) within the areas targeted by this assay, and inadequate number of viral copies(<138 copies/mL). A negative result must be combined with clinical observations, patient history, and epidemiological information. The expected result is Negative.  Fact Sheet for Patients:  EntrepreneurPulse.com.au  Fact Sheet for Healthcare Providers:  IncredibleEmployment.be  This test is no t yet approved or cleared by the [place] and  has been authorized for detection and/or diagnosis of SARS-CoV-2 by FDA under an Emergency Use Authorization (EUA). This EUA will remain  in effect (meaning this test can be used) for the duration of the COVID-19 declaration under Section 564(b)(1) of the Act, 21 U.S.C.section 360bbb-3(b)(1), unless the authorization is terminated  or revoked sooner.       Influenza A by PCR NEGATIVE NEGATIVE Final   Influenza B by PCR NEGATIVE NEGATIVE Final    Comment: (NOTE) The Xpert Xpress SARS-CoV-2/FLU/RSV plus assay is intended as an aid in the diagnosis of influenza from Nasopharyngeal swab specimens and should not be used as a sole basis for treatment. Nasal washings and aspirates are unacceptable for Xpert Xpress SARS-CoV-2/FLU/RSV testing.  Fact Sheet for  Patients: EntrepreneurPulse.com.au  Fact Sheet for Healthcare Providers: IncredibleEmployment.be  This test is not yet approved or cleared by the [place] FDA and has been authorized for detection and/or diagnosis of SARS-CoV-2 by FDA under an Emergency Use Authorization (EUA). This EUA will remain in effect (meaning this test can be used) for the duration of the COVID-19 declaration under Section 564(b)(1) of the Act, 21 U.S.C. section 360bbb-3(b)(1), unless the authorization is terminated or revoked.  Performed at [facility], [place], [place] [address]   Respiratory (~20 pathogens) panel by PCR     Status: None   Collection Time: 02/11/21  2:38 PM   Specimen: Nasopharyngeal Swab; Respiratory  Result Value Ref Range Status   Adenovirus NOT DETECTED NOT DETECTED Final   Coronavirus 229E NOT DETECTED NOT DETECTED Final    Comment: (NOTE) The Coronavirus on the Respiratory Panel, DOES NOT test for the novel  Coronavirus (2019 nCoV)    Coronavirus HKU1 NOT DETECTED NOT DETECTED Final   Coronavirus NL63 NOT DETECTED NOT DETECTED Final   Coronavirus OC43 NOT DETECTED NOT DETECTED Final   Metapneumovirus NOT DETECTED NOT DETECTED Final   Rhinovirus / Enterovirus NOT DETECTED NOT DETECTED Final   Influenza A NOT DETECTED NOT DETECTED Final   Influenza B NOT DETECTED NOT DETECTED Final   Parainfluenza Virus 1 NOT DETECTED NOT DETECTED Final   Parainfluenza Virus 2 NOT DETECTED NOT DETECTED Final   Parainfluenza Virus 3 NOT DETECTED NOT DETECTED Final   Parainfluenza Virus 4 NOT DETECTED NOT DETECTED Final   Respiratory Syncytial Virus NOT DETECTED NOT DETECTED Final   Bordetella pertussis NOT DETECTED NOT DETECTED Final   Bordetella Parapertussis NOT DETECTED NOT DETECTED Final   Chlamydophila pneumoniae NOT DETECTED NOT DETECTED Final   Mycoplasma pneumoniae NOT DETECTED NOT DETECTED Final    Comment: Performed at  [facility], [place]. [address], [place], [place] [address]         Radiology Studies: No results found.      Scheduled Meds:  aspirin EC  81 mg Oral Daily   atorvastatin  80 mg Oral Daily   azithromycin  250 mg Oral Daily   enoxaparin (LOVENOX) injection  0.5 mg/kg Subcutaneous Q24H   furosemide  20 mg Oral Daily   gabapentin  600 mg Oral QID   insulin aspart  0-5 Units Subcutaneous QHS   insulin aspart  0-9 Units Subcutaneous TID WC   insulin glargine-yfgn  60 Units Subcutaneous BID   ipratropium-albuterol  3 mL Nebulization Q4H   irbesartan  150 mg Oral Daily   loratadine  10 mg Oral Daily  metoprolol tartrate  25 mg Oral BID   montelukast  10 mg Oral QHS   multivitamin with minerals  1 tablet Oral Daily   predniSONE  40 mg Oral Q breakfast   Continuous Infusions:     LOS: 2 days    Time spent: 25 mins.More than 50% of that time was spent in counseling and/or coordination of care.      Shelly Coss, MD [facility] P11/11/2020, 7:35 AM

## 2021-02-13 NOTE — TOC Initial Note (Signed)
Transition of Care [facility]) - Initial/Assessment Note    Patient Details  Name: Henry [name] MRN: [id number] Date of Birth: [birth year]  Transition [facility]) CM/SW Contact:    Candie Chroman, LCSW Phone Number: 02/13/2021, 12:41 PM  Clinical Narrative:  CSW met with patient. No supports at bedside. CSW introduced role and explained that therapy recommendations would be discussed. Patient is agreeable to home health. Reviewed CMS scores for agencies that serve his zip code and accept [facility]. [facility] is first preference and they will review referral. Patient is agreeable to DME recommendations for RW and 3-in-1. Patient is not on oxygen at home. Will follow for this potential need as well. Patient confirmed he will have a ride home at discharge. No further concerns. CSW encouraged patient to contact CSW as needed. CSW will continue to follow patient for support and facilitate return home when stable.                 Expected Discharge Plan: [place] Barriers to Discharge: Continued Medical Work up   Patient Goals and CMS Choice   CMS Medicare.gov Compare Post Acute Care list provided to:: Patient    Expected Discharge Plan and Services Expected Discharge Plan: [place] Choice: Durable Medical Equipment, Home Health Living arrangements for the past 2 months: Single Family Home                                      Prior Living Arrangements/Services Living arrangements for the past 2 months: Single Family Home Lives with:: Self Patient language and need for interpreter reviewed:: Yes Do you feel safe going back to the place where you live?: Yes      Need for Family Participation in Patient Care: Yes (Comment) Care giver support system in place?: No (comment)   Criminal Activity/Legal Involvement Pertinent to Current Situation/Hospitalization: No - Comment as needed  Activities of Daily Living Home  Assistive Devices/Equipment: Eyeglasses, CBG Meter ADL Screening (condition at time of admission) Patient's cognitive ability adequate to safely complete daily activities?: Yes Is the patient deaf or have difficulty hearing?: No Does the patient have difficulty seeing, even when wearing glasses/contacts?: No Does the patient have difficulty concentrating, remembering, or making decisions?: No Patient able to express need for assistance with ADLs?: Yes Does the patient have difficulty dressing or bathing?: No Independently performs ADLs?: Yes (appropriate for developmental age) Does the patient have difficulty walking or climbing stairs?: No Weakness of Legs: None Weakness of Arms/Hands: None  Permission Sought/Granted Permission sought to share information with : Facility Art therapist granted to share information with : Yes, Verbal Permission Granted     Permission granted to share info w AGENCY: [facility]        Emotional Assessment Appearance:: Appears stated age Attitude/Demeanor/Rapport: Engaged, Gracious, Charismatic Affect (typically observed): Accepting, Appropriate, Pleasant Orientation: : Oriented to Self, Oriented to Place, Oriented to  Time, Oriented to Situation Alcohol / Substance Use: Not Applicable Psych Involvement: No (comment)  Admission diagnosis:  COPD exacerbation (HCC) [J44.1] Acute respiratory failure with hypoxia (HCC) [J96.01] Chronic obstructive pulmonary disease, unspecified COPD type ([place]) [J44.9] Patient Active Problem List   Diagnosis Date Noted   COPD exacerbation ([place]) 02/11/2021   CKD (chronic kidney disease), stage IIIa 02/11/2021   Severe sepsis ([place]) 02/11/2021  Chest pain 08/02/2020   Unstable angina ([place]) 08/02/2020   Dyspnea on exertion 12/25/2019   Acute respiratory failure with hypoxia ([place]) 12/25/2019   Hypoxia 12/24/2019   S/P angioplasty with stent 08/10/17 emergently to LAD ,  08/13/17 atherectomy and  stent to pRCA  08/14/2017   CAD in native artery 08/12/2017   Leukocytosis 08/12/2017   Insulin dependent diabetes mellitus 08/12/2017   NSTEMI (non-ST elevated myocardial infarction) ([place]) 08/12/2017   Acute ST elevation myocardial infarction (STEMI) involving left anterior descending (LAD) coronary artery ([place]) 08/10/2017   ST elevation myocardial infarction involving left anterior descending (LAD) coronary artery ([place])    COPD (chronic obstructive pulmonary disease) ([place]) 06/02/2017   CAP (community acquired pneumonia) 01/25/2016   Diabetes ([place]) 01/25/2016   HTN (hypertension) 01/25/2016   HLD (hyperlipidemia) 01/25/2016   Benign essential hypertension 07/12/2013   PCP:  Juluis Pitch, MD Pharmacy:   [place], [place] - [address] [place] [address] Phone: [phone] Fax: [phone]  [facility] [employer], [place] - [place] [address] Phone: [phone] Fax: [phone]  [facility] - [place], [place] [address] Phone: [phone] Fax: [place], [place] [address] Phone: [phone] Fax: [phone]     Social Determinants of Health (SDOH) Interventions    Readmission Risk Interventions No flowsheet data found.

## 2021-02-13 NOTE — Progress Notes (Signed)
Occupational Therapy Treatment Patient Details Name: Henry [name] MRN: [id number] DOB: [birth year] Today's Date: 02/13/2021   History of present illness Henry [name] is a 69yoF who comes to [facility] on 02/10/21 c CP, SOB, cough.   PMH: HTN, HLD, DM, COPD, CAD s/p stent, CKD3a. BNP/HS troponin both unremarkable. Pt admitted with COPD exacerbation.   OT comments  Pt in bed upon OT arrival and agreeable to OT session.  Pt participated in standing ADL at sink to brush teeth.  Seated rest break needed with cues for PLB before amb to bathroom for toileting.  Pt urinated while seated for energy conservation purposes.  Stood at sink for Comptroller with SBA, vc to cease conversation during standing activity and functional mobility to avoid 02 desaturation.  Close supv for mobility this day without LOB, no AD.  Performed sit to stand x5 from EOB without UE support during ascent.  02 at 3L this day, ranging from 88-94% throughout session with sitting breaks between each ADL.  Reviewed EC strategies for home carryover, including recommendation to place chair at bathroom sink for seated shaving as needed, encouraged chair in shower while bathing, and frequent rest breaks with activity.  Pt verbalized understanding.  Pt reports he plans to have 3in1 sent home with him which he may use in the shower.  Assisted pt back to bed end of session with feet elevated d/t increase in BLE edema which pt notes compared to yesterday.  Encouraged ankle pumps hourly with good return demo.  Pt will continue to benefit from skilled OT to work towards return to [facility] and reduce risk for further deconditioning during hospitalization.    Recommendations for follow up therapy are one component of a multi-disciplinary discharge planning process, led by the attending physician.  Recommendations may be updated based on patient status, additional functional criteria and insurance authorization.    Follow Up Recommendations  Home health  OT    Assistance Recommended at Discharge Intermittent Supervision/Assistance  Equipment Recommendations  BSC/3in1          Precautions / Restrictions Precautions Precautions: Fall Restrictions Weight Bearing Restrictions: No       Mobility Bed Mobility Overal bed mobility: Independent Bed Mobility: Supine to Sit;Sit to Supine     Supine to sit: Independent;HOB elevated Sit to supine: Independent;HOB elevated   General bed mobility comments: uses bed rail Patient Response: Cooperative  Transfers Overall transfer level: Needs assistance Equipment used: Rolling walker (2 wheels) Transfers: Sit to/from Stand Sit to Stand: Supervision           General transfer comment:  (No unsteadiness noted this session)     Balance Overall balance assessment: Needs assistance Sitting-balance support: No upper extremity supported;Feet unsupported Sitting balance-Leahy Scale: Good Sitting balance - Comments: Good dynamic balance at EOB   Standing balance support: Single extremity supported Standing balance-Leahy Scale: Fair Standing balance comment: 1 hand on countertop for support while standing to brush teeth; unsupported to set up toothbrush                           ADL either performed or assessed with clinical judgement   ADL Overall ADL's : Needs assistance/impaired     Grooming: Wash/dry hands;Oral care;Supervision/safety Grooming Details (indicate cue type and reason): stood at sink for hand hygiene and brushing teeth; vc for energy conservation strategies throughout                 Toilet  Transfer: Supervision/safety;Grab bars           Functional mobility during ADLs: Supervision/safety General ADL Comments: supv for sitting and standing ADLs to utilize energy conservation strategies and PLB    Extremity/Trunk Assessment Upper Extremity Assessment Upper Extremity Assessment: Overall WFL for tasks assessed            Vision  Baseline Vision/History: 1 Wears glasses Patient Visual Report: No change from baseline                Cognition Arousal/Alertness: Awake/alert Behavior During Therapy: WFL for tasks assessed/performed Overall Cognitive Status: Within Functional Limits for tasks assessed                                 General Comments:  (Very talkative and pleasant)          Exercises Exercises:  (Pt educated on energy conservation techniques and PLB with good understanding and teach back)   Shoulder Instructions       General Comments Pt with SOB upon gait exertion    Pertinent Vitals/ Pain       Pain Assessment: No/denies pain                                                          Frequency  Min 2X/week        Progress Toward Goals  OT Goals(current goals can now be found in the care plan section)  Progress towards OT goals: Progressing toward goals  Acute Rehab OT Goals Patient Stated Goal: to return home OT Goal Formulation: With patient Time For Goal Achievement: 02/26/21 Potential to Achieve Goals: Good  Plan Discharge plan remains appropriate                     AM-PAC OT "6 Clicks" Daily Activity     Outcome Measure   Help from another person eating meals?: None Help from another person taking care of personal grooming?: A Little Help from another person toileting, which includes using toliet, bedpan, or urinal?: A Little Help from another person bathing (including washing, rinsing, drying)?: A Little Help from another person to put on and taking off regular upper body clothing?: None Help from another person to put on and taking off regular lower body clothing?: A Little 6 Click Score: 20    End of Session    OT Visit Diagnosis: Muscle weakness (generalized) (M62.81);Unsteadiness on feet (R26.81)   Activity Tolerance Patient tolerated treatment well   Patient Left in bed;with call bell/phone within  reach             Time: 1352-1425 OT Time Calculation (min): 33 min  Charges: OT General Charges $OT Visit: 1 Visit OT Treatments $Self Care/Home Management : 23-37 mins  Leta Speller, MS, OTR/L   Darleene Cleaver 02/13/2021, 5:24 PM

## 2021-02-14 DIAGNOSIS — J441 Chronic obstructive pulmonary disease with (acute) exacerbation: Secondary | ICD-10-CM | POA: Diagnosis not present

## 2021-02-14 LAB — GLUCOSE, CAPILLARY
Glucose-Capillary: 104 mg/dL — ABNORMAL HIGH (ref 70–99)
Glucose-Capillary: 194 mg/dL — ABNORMAL HIGH (ref 70–99)

## 2021-02-14 MED ORDER — ALBUTEROL SULFATE HFA 108 (90 BASE) MCG/ACT IN AERS: 2.0000 | INHALATION_SPRAY | Freq: Four times a day (QID) | RESPIRATORY_TRACT | 2 refills | Status: DC | PRN

## 2021-02-14 MED ORDER — FLUTICASONE-SALMETEROL 250-50 MCG/DOSE IN AEPB
1.0000 | INHALATION_SPRAY | Freq: Two times a day (BID) | RESPIRATORY_TRACT | 1 refills | Status: DC
Start: 2021-02-14 — End: 2022-07-29

## 2021-02-14 MED ORDER — FUROSEMIDE 20 MG PO TABS
20.0000 mg | ORAL_TABLET | Freq: Every day | ORAL | 1 refills | Status: AC
Start: 2021-02-14 — End: ?

## 2021-02-14 MED ORDER — AZITHROMYCIN 250 MG PO TABS: 250.0000 mg | ORAL_TABLET | Freq: Once | ORAL | 0 refills | Status: AC

## 2021-02-14 MED ORDER — SPIRIVA HANDIHALER 18 MCG IN CAPS: 18.0000 ug | ORAL_CAPSULE | Freq: Every day | RESPIRATORY_TRACT | 1 refills | Status: DC

## 2021-02-14 MED ORDER — PREDNISONE 20 MG PO TABS: 40.0000 mg | ORAL_TABLET | Freq: Every day | ORAL | 0 refills | Status: AC

## 2021-02-14 MED ORDER — DM-GUAIFENESIN ER 30-600 MG PO TB12: 1.0000 | ORAL_TABLET | Freq: Two times a day (BID) | ORAL | 0 refills | Status: AC | PRN

## 2021-02-14 MED ORDER — IPRATROPIUM-ALBUTEROL 0.5-2.5 (3) MG/3ML IN SOLN: 3.0000 mL | Freq: Four times a day (QID) | RESPIRATORY_TRACT | 1 refills | Status: DC | PRN

## 2021-02-14 NOTE — Progress Notes (Signed)
Patient to be discharged to home via wheelchair accompanied by family. Awaiting ride at this time.  Patient discharged with pertinent information, prescriptions, equipment, and personal belongings.  Patient able to teach back discharge information, all question answered. IV site d/ced.  No acute distress noted. Care relinquished.

## 2021-02-14 NOTE — TOC Transition Note (Signed)
Transition of Care [facility]) - CM/SW Discharge Note   Patient Details  Name: Henry [name] MRN: [id number] Date of Birth: 07/21/1945  Transition of Care [facility]) CM/SW Contact:  Candie Chroman, LCSW Phone Number: 02/14/2021, 11:04 AM   Clinical Narrative:  Patient has orders to discharge home today. [facility] able [facility] accept for PT, OT, and RN. Ordered RW, 3-in-1, oxygen, and nebulizer machine through [employer]. No further concerns. CSW signing off.  Final next level of care: Home w Home Health Services Barriers to Discharge: Barriers Resolved   Patient Goals and CMS Choice   CMS Medicare.gov Compare Post Acute Care list provided to:: Patient Choice offered to / list presented to : Patient  Discharge Placement                    Patient and family notified of of transfer: 02/14/21  Discharge Plan and Services     Post Acute Care Choice: Durable Medical Equipment, Home Health          DME Arranged: 3-N-1, Walker rolling, Oxygen, Nebulizer machine DME Agency: [employer] Date DME Agency Contacted: 02/14/21   Representative spoke with at DME Agency: Suanne Marker HH Arranged: RN, PT, OT [facility] Agency: [place] Date [place]: 02/14/21   Representative spoke with at [place]: Adela Lank  Social Determinants of Health (SDOH) Interventions     Readmission Risk Interventions No flowsheet data found.

## 2021-02-14 NOTE — Progress Notes (Signed)
SATURATION QUALIFICATIONS:  Patient Saturations on Room Air at Rest = 87%  Patient Saturations on Room Air while Ambulating = 85%  Patient Saturations on 2 Liters of oxygen while Ambulating = 93%  Patient complain of shortness of breath while ambulating on room air. O2 sats 94% on 2 liters o2 at rest.

## 2021-02-14 NOTE — Discharge Summary (Signed)
Physician Discharge Summary  Orla Jolliff Guercio EPP:[id number] DOB: Feb 27, 1946 DOA: 02/11/2021  PCP: Juluis Pitch, MD  Admit date: 02/11/2021 Discharge date: 02/14/2021  Admitted From: Home Disposition:  Home  Discharge Condition:Stable CODE STATUS:FULL Diet recommendation: Heart Healthy    Brief/Interim Summary:  Patient is a 58-year male with history of hypertension, hyperlipidemia, diabetes type 2, COPD, coronary artery disease, status post stent placement, CKD stage III, skin cancer who presented with shortness of breath.  He was progressively dyspneic for over a week, had cough, no reported fever or chills.  On presentation his oxygenation was 88-89% on room air, improved to 97% on 2 L.  COVID screen test was negative.  Chest x-ray showed streaky interstitial opacity.  CT angiogram negative for PE.  Patient was admitted for the management of COPD exacerbation.  Respiratory status has improved with the steroids and bronchodilator treatment.  He qualified for home oxygen.  He is medically stable for discharge to home today.  He needs to follow-up with pulmonology as an outpatient.  Following problems were addressed during his hospitalization:   Acute  hypoxic respiratory failure secondary COPD exacerbation: Presented with dyspnea, cough.  Desaturated on room air on presentation. Being  treated for COPD exacerbation.  Started on steroid, anticough medication.  Currently on 2 L of oxygen per minute.  Not on oxygen at home.  Qualified for home oxygen.  He has been started on albuterol, Advair, Spiriva.  He will also be supplied with home nebulization machine.  He needs to to follow-up with pulmonology as an  outpatient for PFT. He will be discharged on 3 more days of prednisone.  suspected sepsis: Was tachycardic, tachypneic, had elevated lactate on presentation.  Sepsis physiology has improved   Hypertension: Continue irbesartan, metoprolol   Hyperlipidemia: On Lipitor   Coronary  artery disease: Currently without anginal symptoms.  Troponin is normal.  On aspirin, Lipitor, metoprolol at home.   CKD stage III: Currently stable   Obesity:BMI of 31.1   Deconditioning: PT/OT recommended home health on discharge   Discharge Diagnoses:  Principal Problem:   COPD exacerbation ([place]) Active Problems:   HTN (hypertension)   HLD (hyperlipidemia)   CAD in native artery   Acute respiratory failure with hypoxia (HCC)   Chest pain   CKD (chronic kidney disease), stage IIIa   Severe sepsis [facility])    Discharge Instructions  Discharge Instructions     Diet - low sodium heart healthy   Complete by: As directed    Discharge instructions   Complete by: As directed    1)Please take prescribed medications as instructed 2)Follow up with your PCP in a week 3)Follow up with your pulmonologist in 2 weeks.   Increase activity slowly   Complete by: As directed       Allergies as of 02/14/2021       Reactions   Lisinopril Cough        Medication List     STOP taking these medications    Combivent Respimat 20-100 MCG/ACT Aers respimat Generic drug: Ipratropium-Albuterol Replaced by: ipratropium-albuterol 0.5-2.5 (3) MG/3ML Soln       TAKE these medications    albuterol 108 (90 Base) MCG/ACT inhaler Commonly known as: VENTOLIN HFA Inhale 2 puffs into the lungs every 6 (six) hours as needed for wheezing or shortness of breath.   aspirin 81 MG EC tablet Take 1 tablet (81 mg total) by mouth daily.   atorvastatin 80 MG tablet Commonly known as: LIPITOR Take 1 tablet (  80 mg total) by mouth daily at 6 PM. What changed: when to take this   azithromycin 250 MG tablet Commonly known as: ZITHROMAX Take 1 tablet (250 mg total) by mouth once for 1 dose. Start taking on: February 15, 2021   cetirizine 10 MG tablet Commonly known as: ZYRTEC Take 10 mg by mouth daily.   dextromethorphan-guaiFENesin 30-600 MG 12hr tablet Commonly known as: MUCINEX DM Take 1  tablet by mouth 2 (two) times daily as needed for up to 7 days for cough.   diclofenac Sodium 1 % Gel Commonly known as: VOLTAREN APPLY 4 GRAMS TOPICALLY FOUR TIMES A DAY AS NEEDED FOR PAIN   fluticasone 50 MCG/ACT nasal spray Commonly known as: FLONASE Place 1 spray into both nostrils daily.   Fluticasone-Salmeterol 250-50 MCG/DOSE Aepb Commonly known as: ADVAIR Inhale 1 puff into the lungs 2 (two) times daily. What changed: when to take this   furosemide 20 MG tablet Commonly known as: LASIX Take 1 tablet (20 mg total) by mouth daily.   gabapentin 600 MG tablet Commonly known as: NEURONTIN Take 600 mg by mouth 4 (four) times daily.   HYDROcodone-acetaminophen 5-325 MG tablet Commonly known as: NORCO/VICODIN TAKE 1 TABLET BY MOUTH 3 TIMES A DAY AS NEEDED FOR PAIN. MAY MAKE YOU DROWSY.THESE TABLETS HAVE 325MG  ACETAMINOPHEN(GENERIC FOR TYLENOL);MAXIMUM SAFE DOSE 3000MG  ACETAMINOPHEN A DAY. DOSE ADJUSTMENT   insulin detemir 100 UNIT/ML injection Commonly known as: LEVEMIR Inject 0.93 mLs (93 Units total) into the skin 2 (two) times daily. What changed: how much to take   ipratropium-albuterol 0.5-2.5 (3) MG/3ML Soln Commonly known as: DUONEB Take 3 mLs by nebulization every 6 (six) hours as needed (wheezing). Replaces: Combivent Respimat 20-100 MCG/ACT Aers respimat   irbesartan 150 MG tablet Commonly known as: AVAPRO Take 150 mg by mouth daily.   metoprolol tartrate 25 MG tablet Commonly known as: LOPRESSOR Take 1 tablet (25 mg total) by mouth 2 (two) times daily.   montelukast 10 MG tablet Commonly known as: SINGULAIR Take 10 mg by mouth at bedtime.   multivitamin with minerals Tabs tablet Take 1 tablet by mouth daily.   nitroGLYCERIN 0.4 MG SL tablet Commonly known as: NITROSTAT PLACE 1 TABLET UNDER TONGUE EVERY 5 MIN AS NEEDED FOR CHEST PAIN IF NO RELIEF IN15 MIN CALL 911 (MAX 3 TABS) What changed: See the new instructions.   predniSONE 20 MG  tablet Commonly known as: DELTASONE Take 2 tablets (40 mg total) by mouth daily for 3 days. Start taking on: February 15, 2021   sitaGLIPtin-metformin 50-1000 MG tablet Commonly known as: JANUMET Take 1 tablet by mouth 2 (two) times daily with a meal.   Spiriva HandiHaler 18 MCG inhalation capsule Generic drug: tiotropium Place 1 capsule (18 mcg total) into inhaler and inhale daily.   tamsulosin 0.4 MG Caps capsule Commonly known as: FLOMAX Take 0.4 mg by mouth daily.               Durable Medical Equipment  (From admission, onward)           Start     Ordered   02/14/21 1051  For home use only DME Nebulizer machine  Once       Question Answer Comment  Patient needs a nebulizer to treat with the following condition COPD (chronic obstructive pulmonary disease) ([place])   Length of Need Lifetime      02/14/21 1050   02/14/21 1042  For home use only DME oxygen  Once  Question Answer Comment  Length of Need Lifetime   Liters per Minute 2   Frequency Continuous (stationary and portable oxygen unit needed)   Oxygen delivery system Gas      02/14/21 1042   02/14/21 0745  For home use only DME Walker rolling  Once       Question Answer Comment  Walker: With [place]   Patient needs a walker to treat with the following condition Balance disorder      02/14/21 0744   02/14/21 0745  For home use only DME 3 n 1  Once        02/14/21 0744            Follow-up Information     [facility] Follow up.   Specialty: Home Health Services Why: They will follow up with you for your home health needs: Physical therapy, occupational therapy, and nursing. Contact information: [place] [address] [phone]         Juluis Pitch, MD. Schedule an appointment as soon as possible for a visit in 1 week(s).   Specialty: Family Medicine Contact information: [address] [place] [address] [phone]          Isaias Cowman, MD .   Specialty: Cardiology Contact information: [facility] [place] [address] [phone]                Allergies  Allergen Reactions   Lisinopril Cough    Consultations: None   Procedures/Studies: DG Chest 2 View  Result Date: 02/10/2021 CLINICAL DATA:  Chest pain. EXAM: CHEST - 2 VIEW COMPARISON:  October 20, 2020 FINDINGS: Tortuosity of the aorta and calcific atherosclerotic disease. Cardiomediastinal silhouette is normal. Mediastinal contours appear intact. No new lobar consolidation. Persistent thickening of the right pleura with streaky interstitial opacities in the right mid thorax and lingula. The findings are relatively stable from July 2022. Osseous structures are without acute abnormality. Soft tissues are grossly normal. IMPRESSION: 1. No active cardiopulmonary disease. 2. Persistent thickening of the right pleura with streaky interstitial opacities in the right mid thorax and lingula. Electronically Signed   By: Fidela Salisbury M.D.   On: 02/10/2021 17:01   CT Angio Chest PE W and/or Wo Contrast  Result Date: 02/11/2021 CLINICAL DATA:  PE suspected, high prob.  Chest pain.  Cough. EXAM: CT ANGIOGRAPHY CHEST WITH CONTRAST TECHNIQUE: Multidetector CT imaging of the chest was performed using the standard protocol during bolus administration of intravenous contrast. Multiplanar CT image reconstructions and MIPs were obtained to evaluate the vascular anatomy. CONTRAST:  57mL OMNIPAQUE IOHEXOL 350 MG/ML SOLN COMPARISON:  12/24/2019 chest CT angiogram. FINDINGS: Cardiovascular: The study is high quality for the evaluation of pulmonary embolism. There are no filling defects in the central, lobar, segmental or subsegmental pulmonary artery branches to suggest acute pulmonary embolism. Atherosclerotic nonaneurysmal thoracic aorta. Dilated main pulmonary artery (3.5 cm diameter). Normal heart size. No  significant pericardial fluid/thickening. Three-vessel coronary atherosclerosis. Mediastinum/Nodes: No discrete thyroid nodules. Unremarkable esophagus. No pathologically enlarged axillary, mediastinal or hilar lymph nodes. Lungs/Pleura: No pneumothorax. Stable tiny calcified posterior right pleural plaques. No pleural effusions. Mild-to-moderate centrilobular emphysema with mild diffuse bronchial wall thickening. No acute consolidative airspace disease or lung masses. Anterior right lower lobe 3 mm solid pulmonary nodule (series 6/image 55), stable, considered benign. No new significant pulmonary nodules. Small parenchymal bands in the dependent lung bases bilaterally, unchanged, compatible with nonspecific mild  postinfectious/postinflammatory scarring. Upper abdomen: Nonobstructing stones in the upper kidneys bilaterally, largest 7 mm on the right and 8 mm on the left. Subcentimeter hypodense bilateral upper renal cortical lesions are too small to characterize and require no follow-up. Musculoskeletal: No aggressive appearing focal osseous lesions. Mild thoracic spondylosis. Review of the MIP images confirms the above findings. IMPRESSION: 1. No pulmonary embolism. 2. Mild-to-moderate centrilobular emphysema with mild diffuse bronchial wall thickening, suggesting COPD. 3. Dilated main pulmonary artery, suggesting pulmonary arterial hypertension. 4. Three-vessel coronary atherosclerosis. 5. Nonobstructing bilateral nephrolithiasis. 6. Aortic Atherosclerosis (ICD10-I70.0) and Emphysema (ICD10-J43.9). Electronically Signed   By: Ilona Sorrel M.D.   On: 02/11/2021 06:45      Subjective: Patient seen and examined at the bedside this morning.  Hemodynamically stable for discharge  Discharge Exam: Vitals:   02/14/21 0728 02/14/21 0830  BP: 93/74   Pulse: 68   Resp: 18   Temp: (!) 97.4 F (36.3 C)   SpO2: 92% 91%   Vitals:   02/14/21 0435 02/14/21 0446 02/14/21 0728 02/14/21 0830  BP: 102/70  93/74    Pulse: 66  68   Resp: 16  18   Temp: (!) 97.5 F (36.4 C)  (!) 97.4 F (36.3 C)   TempSrc: Oral  Oral   SpO2: 92%  92% 91%  Weight:  105.6 kg    Height:        General: Pt is alert, awake, not in acute distress Cardiovascular: RRR, S1/S2 +, no rubs, no gallops Respiratory: CTA bilaterally, no wheezing, no rhonchi,diminished air sounds bilaterally Abdominal: Soft, NT, ND, bowel sounds + Extremities: no edema, no cyanosis    The results of significant diagnostics from this hospitalization (including imaging, microbiology, ancillary and laboratory) are listed below for reference.     Microbiology: Recent Results (from the past 240 hour(s))  Resp Panel by RT-PCR (Flu A&B, Covid) Nasopharyngeal Swab     Status: None   Collection Time: 02/10/21  3:48 PM   Specimen: Nasopharyngeal Swab; Nasopharyngeal(NP) swabs in vial transport medium  Result Value Ref Range Status   SARS Coronavirus 2 by RT PCR NEGATIVE NEGATIVE Final    Comment: (NOTE) SARS-CoV-2 target nucleic acids are NOT DETECTED.  The SARS-CoV-2 RNA is generally detectable in upper respiratory specimens during the acute phase of infection. The lowest concentration of SARS-CoV-2 viral copies this assay can detect is 138 copies/mL. A negative result does not preclude SARS-Cov-2 infection and should not be used as the sole basis for treatment or other patient management decisions. A negative result may occur with  improper specimen collection/handling, submission of specimen other than nasopharyngeal swab, presence of viral mutation(s) within the areas targeted by this assay, and inadequate number of viral copies(<138 copies/mL). A negative result must be combined with clinical observations, patient history, and epidemiological information. The expected result is Negative.  Fact Sheet for Patients:  EntrepreneurPulse.com.au  Fact Sheet for Healthcare Providers:   IncredibleEmployment.be  This test is no t yet approved or cleared by the [place] FDA and  has been authorized for detection and/or diagnosis of SARS-CoV-2 by FDA under an Emergency Use Authorization (EUA). This EUA will remain  in effect (meaning this test can be used) for the duration of the COVID-19 declaration under Section 564(b)(1) of the Act, 21 U.S.C.section 360bbb-3(b)(1), unless the authorization is terminated  or revoked sooner.       Influenza A by PCR NEGATIVE NEGATIVE Final   Influenza B by PCR NEGATIVE NEGATIVE Final  Comment: (NOTE) The Xpert Xpress SARS-CoV-2/FLU/RSV plus assay is intended as an aid in the diagnosis of influenza from Nasopharyngeal swab specimens and should not be used as a sole basis for treatment. Nasal washings and aspirates are unacceptable for Xpert Xpress SARS-CoV-2/FLU/RSV testing.  Fact Sheet for Patients: EntrepreneurPulse.com.au  Fact Sheet for Healthcare Providers: IncredibleEmployment.be  This test is not yet approved or cleared by the [place] FDA and has been authorized for detection and/or diagnosis of SARS-CoV-2 by FDA under an Emergency Use Authorization (EUA). This EUA will remain in effect (meaning this test can be used) for the duration of the COVID-19 declaration under Section 564(b)(1) of the Act, 21 U.S.C. section 360bbb-3(b)(1), unless the authorization is terminated or revoked.  Performed at [facility], [place], [place] [address]   Respiratory (~20 pathogens) panel by PCR     Status: None   Collection Time: 02/11/21  2:38 PM   Specimen: Nasopharyngeal Swab; Respiratory  Result Value Ref Range Status   Adenovirus NOT DETECTED NOT DETECTED Final   Coronavirus 229E NOT DETECTED NOT DETECTED Final    Comment: (NOTE) The Coronavirus on the Respiratory Panel, DOES NOT test for the novel  Coronavirus (2019 nCoV)     Coronavirus HKU1 NOT DETECTED NOT DETECTED Final   Coronavirus NL63 NOT DETECTED NOT DETECTED Final   Coronavirus OC43 NOT DETECTED NOT DETECTED Final   Metapneumovirus NOT DETECTED NOT DETECTED Final   Rhinovirus / Enterovirus NOT DETECTED NOT DETECTED Final   Influenza A NOT DETECTED NOT DETECTED Final   Influenza B NOT DETECTED NOT DETECTED Final   Parainfluenza Virus 1 NOT DETECTED NOT DETECTED Final   Parainfluenza Virus 2 NOT DETECTED NOT DETECTED Final   Parainfluenza Virus 3 NOT DETECTED NOT DETECTED Final   Parainfluenza Virus 4 NOT DETECTED NOT DETECTED Final   Respiratory Syncytial Virus NOT DETECTED NOT DETECTED Final   Bordetella pertussis NOT DETECTED NOT DETECTED Final   Bordetella Parapertussis NOT DETECTED NOT DETECTED Final   Chlamydophila pneumoniae NOT DETECTED NOT DETECTED Final   Mycoplasma pneumoniae NOT DETECTED NOT DETECTED Final    Comment: Performed at [facility], [place]. [address] [place], [place] [address]     Labs: BNP (last 3 results) Recent Labs    08/02/20 0459 02/11/21 0532  BNP 22.6 93.9   Basic Metabolic Panel: Recent Labs  Lab 02/10/21 1548 02/12/21 0616 02/13/21 0448  NA 137 138 138  K 4.4 4.4 4.5  CL 100 103 105  CO2 27 23 27   GLUCOSE 198* 248* 146*  BUN 19 25* 28*  CREATININE 1.32* 1.30* 1.23  CALCIUM 10.2 9.5 9.3   Liver Function Tests: No results for input(s): AST, ALT, ALKPHOS, BILITOT, PROT, ALBUMIN in the last 168 hours. No results for input(s): LIPASE, AMYLASE in the last 168 hours. No results for input(s): AMMONIA in the last 168 hours. CBC: Recent Labs  Lab 02/10/21 1548  WBC 7.6  NEUTROABS 4.4  HGB 16.2  HCT 50.8  MCV 88.0  PLT 253   Cardiac Enzymes: No results for input(s): CKTOTAL, CKMB, CKMBINDEX, TROPONINI in the last 168 hours. BNP: Invalid input(s): POCBNP CBG: Recent Labs  Lab 02/13/21 0740 02/13/21 1139 02/13/21 1634 02/13/21 2109 02/14/21 0726  GLUCAP 131* 156* 212* 191* 104*    D-Dimer No results for input(s): DDIMER in the last 72 hours. Hgb A1c Recent Labs    02/11/21 1157  HGBA1C 7.9*   Lipid Profile Recent Labs    02/12/21  0616  CHOL 112  HDL 37*  LDLCALC 55  TRIG 99  CHOLHDL 3.0   Thyroid function studies No results for input(s): TSH, T4TOTAL, T3FREE, THYROIDAB in the last 72 hours.  Invalid input(s): FREET3 Anemia work up No results for input(s): VITAMINB12, FOLATE, FERRITIN, TIBC, IRON, RETICCTPCT in the last 72 hours. Urinalysis    Component Value Date/Time   COLORURINE YELLOW (A) 12/25/2019 0123   APPEARANCEUR CLEAR (A) 12/25/2019 0123   LABSPEC 1.031 (H) 12/25/2019 0123   PHURINE 6.0 12/25/2019 0123   GLUCOSEU >=500 (A) 12/25/2019 0123   HGBUR NEGATIVE 12/25/2019 0123   BILIRUBINUR NEGATIVE 12/25/2019 0123   KETONESUR 5 (A) 12/25/2019 0123   PROTEINUR NEGATIVE 12/25/2019 0123   NITRITE NEGATIVE 12/25/2019 0123   LEUKOCYTESUR NEGATIVE 12/25/2019 0123   Sepsis Labs Invalid input(s): PROCALCITONIN,  WBC,  LACTICIDVEN Microbiology Recent Results (from the past 240 hour(s))  Resp Panel by RT-PCR (Flu A&B, Covid) Nasopharyngeal Swab     Status: None   Collection Time: 02/10/21  3:48 PM   Specimen: Nasopharyngeal Swab; Nasopharyngeal(NP) swabs in vial transport medium  Result Value Ref Range Status   SARS Coronavirus 2 by RT PCR NEGATIVE NEGATIVE Final    Comment: (NOTE) SARS-CoV-2 target nucleic acids are NOT DETECTED.  The SARS-CoV-2 RNA is generally detectable in upper respiratory specimens during the acute phase of infection. The lowest concentration of SARS-CoV-2 viral copies this assay can detect is 138 copies/mL. A negative result does not preclude SARS-Cov-2 infection and should not be used as the sole basis for treatment or other patient management decisions. A negative result may occur with  improper specimen collection/handling, submission of specimen other than nasopharyngeal swab, presence of viral mutation(s)  within the areas targeted by this assay, and inadequate number of viral copies(<138 copies/mL). A negative result must be combined with clinical observations, patient history, and epidemiological information. The expected result is Negative.  Fact Sheet for Patients:  EntrepreneurPulse.com.au  Fact Sheet for Healthcare Providers:  IncredibleEmployment.be  This test is no t yet approved or cleared by the [place] FDA and  has been authorized for detection and/or diagnosis of SARS-CoV-2 by FDA under an Emergency Use Authorization (EUA). This EUA will remain  in effect (meaning this test can be used) for the duration of the COVID-19 declaration under Section 564(b)(1) of the Act, 21 U.S.C.section 360bbb-3(b)(1), unless the authorization is terminated  or revoked sooner.       Influenza A by PCR NEGATIVE NEGATIVE Final   Influenza B by PCR NEGATIVE NEGATIVE Final    Comment: (NOTE) The Xpert Xpress SARS-CoV-2/FLU/RSV plus assay is intended as an aid in the diagnosis of influenza from Nasopharyngeal swab specimens and should not be used as a sole basis for treatment. Nasal washings and aspirates are unacceptable for Xpert Xpress SARS-CoV-2/FLU/RSV testing.  Fact Sheet for Patients: EntrepreneurPulse.com.au  Fact Sheet for Healthcare Providers: IncredibleEmployment.be  This test is not yet approved or cleared by the [place] FDA and has been authorized for detection and/or diagnosis of SARS-CoV-2 by FDA under an Emergency Use Authorization (EUA). This EUA will remain in effect (meaning this test can be used) for the duration of the COVID-19 declaration under Section 564(b)(1) of the Act, 21 U.S.C. section 360bbb-3(b)(1), unless the authorization is terminated or revoked.  Performed at [facility], [place], [place] [address]   Respiratory (~20 pathogens) panel by PCR      Status: None   Collection Time: 02/11/21  2:38 PM  Specimen: Nasopharyngeal Swab; Respiratory  Result Value Ref Range Status   Adenovirus NOT DETECTED NOT DETECTED Final   Coronavirus 229E NOT DETECTED NOT DETECTED Final    Comment: (NOTE) The Coronavirus on the Respiratory Panel, DOES NOT test for the novel  Coronavirus (2019 nCoV)    Coronavirus HKU1 NOT DETECTED NOT DETECTED Final   Coronavirus NL63 NOT DETECTED NOT DETECTED Final   Coronavirus OC43 NOT DETECTED NOT DETECTED Final   Metapneumovirus NOT DETECTED NOT DETECTED Final   Rhinovirus / Enterovirus NOT DETECTED NOT DETECTED Final   Influenza A NOT DETECTED NOT DETECTED Final   Influenza B NOT DETECTED NOT DETECTED Final   Parainfluenza Virus 1 NOT DETECTED NOT DETECTED Final   Parainfluenza Virus 2 NOT DETECTED NOT DETECTED Final   Parainfluenza Virus 3 NOT DETECTED NOT DETECTED Final   Parainfluenza Virus 4 NOT DETECTED NOT DETECTED Final   Respiratory Syncytial Virus NOT DETECTED NOT DETECTED Final   Bordetella pertussis NOT DETECTED NOT DETECTED Final   Bordetella Parapertussis NOT DETECTED NOT DETECTED Final   Chlamydophila pneumoniae NOT DETECTED NOT DETECTED Final   Mycoplasma pneumoniae NOT DETECTED NOT DETECTED Final    Comment: Performed at [facility], [place]. [address], [place], [place] [address]    Please note: You were cared for by a hospitalist during your hospital stay. Once you are discharged, your primary care physician will handle any further medical issues. Please note that NO REFILLS for any discharge medications will be authorized once you are discharged, as it is imperative that you return to your primary care physician (or establish a relationship with a primary care physician if you do not have one) for your post hospital discharge needs so that they can reassess your need for medications and monitor your lab values.    Time coordinating discharge: 40 minutes  SIGNED:   Shelly Coss,  MD  [facility] 02/14/2021, 10:53 AM Pager [phone]  If 7PM-7AM, please contact night-coverage [email or web address] Password [email or web address]

## 2021-02-14 NOTE — Care Management Important Message (Signed)
Important Message  Patient Details  Name: Henry [name] MRN: [id number] Date of Birth: 03-Dec-1945   Medicare Important Message Given:  N/A - LOS <3 / Initial given by admissions     [name] 02/14/2021, 11:48 AM

## 2021-02-15 DIAGNOSIS — J441 Chronic obstructive pulmonary disease with (acute) exacerbation: Secondary | ICD-10-CM | POA: Diagnosis not present

## 2021-02-15 DIAGNOSIS — N183 Chronic kidney disease, stage 3 unspecified: Secondary | ICD-10-CM | POA: Diagnosis not present

## 2021-02-20 DIAGNOSIS — J449 Chronic obstructive pulmonary disease, unspecified: Secondary | ICD-10-CM | POA: Diagnosis not present

## 2021-02-21 DIAGNOSIS — I10 Essential (primary) hypertension: Secondary | ICD-10-CM | POA: Diagnosis not present

## 2021-02-21 DIAGNOSIS — J449 Chronic obstructive pulmonary disease, unspecified: Secondary | ICD-10-CM | POA: Diagnosis not present

## 2021-02-22 DIAGNOSIS — Z955 Presence of coronary angioplasty implant and graft: Secondary | ICD-10-CM | POA: Diagnosis not present

## 2021-02-22 DIAGNOSIS — I491 Atrial premature depolarization: Secondary | ICD-10-CM | POA: Diagnosis not present

## 2021-02-22 DIAGNOSIS — I25118 Atherosclerotic heart disease of native coronary artery with other forms of angina pectoris: Secondary | ICD-10-CM | POA: Diagnosis not present

## 2021-02-22 DIAGNOSIS — I10 Essential (primary) hypertension: Secondary | ICD-10-CM | POA: Diagnosis not present

## 2021-02-22 DIAGNOSIS — I2109 ST elevation (STEMI) myocardial infarction involving other coronary artery of anterior wall: Secondary | ICD-10-CM | POA: Diagnosis not present

## 2021-02-22 DIAGNOSIS — R002 Palpitations: Secondary | ICD-10-CM | POA: Diagnosis not present

## 2021-02-28 DIAGNOSIS — N1831 Chronic kidney disease, stage 3a: Secondary | ICD-10-CM | POA: Diagnosis not present

## 2021-02-28 DIAGNOSIS — E1140 Type 2 diabetes mellitus with diabetic neuropathy, unspecified: Secondary | ICD-10-CM | POA: Diagnosis not present

## 2021-02-28 DIAGNOSIS — I2510 Atherosclerotic heart disease of native coronary artery without angina pectoris: Secondary | ICD-10-CM | POA: Diagnosis not present

## 2021-02-28 DIAGNOSIS — I129 Hypertensive chronic kidney disease with stage 1 through stage 4 chronic kidney disease, or unspecified chronic kidney disease: Secondary | ICD-10-CM | POA: Diagnosis not present

## 2021-02-28 DIAGNOSIS — J9601 Acute respiratory failure with hypoxia: Secondary | ICD-10-CM | POA: Diagnosis not present

## 2021-02-28 DIAGNOSIS — E1122 Type 2 diabetes mellitus with diabetic chronic kidney disease: Secondary | ICD-10-CM | POA: Diagnosis not present

## 2021-02-28 DIAGNOSIS — J432 Centrilobular emphysema: Secondary | ICD-10-CM | POA: Diagnosis not present

## 2021-04-11 DIAGNOSIS — R200 Anesthesia of skin: Secondary | ICD-10-CM | POA: Diagnosis not present

## 2021-04-11 DIAGNOSIS — E785 Hyperlipidemia, unspecified: Secondary | ICD-10-CM | POA: Diagnosis not present

## 2021-04-11 DIAGNOSIS — E1142 Type 2 diabetes mellitus with diabetic polyneuropathy: Secondary | ICD-10-CM | POA: Diagnosis not present

## 2021-04-11 DIAGNOSIS — Z0000 Encounter for general adult medical examination without abnormal findings: Secondary | ICD-10-CM | POA: Diagnosis not present

## 2021-04-11 DIAGNOSIS — Z794 Long term (current) use of insulin: Secondary | ICD-10-CM | POA: Diagnosis not present

## 2021-04-11 DIAGNOSIS — I25118 Atherosclerotic heart disease of native coronary artery with other forms of angina pectoris: Secondary | ICD-10-CM | POA: Diagnosis not present

## 2021-04-11 DIAGNOSIS — I10 Essential (primary) hypertension: Secondary | ICD-10-CM | POA: Diagnosis not present

## 2021-04-11 DIAGNOSIS — I491 Atrial premature depolarization: Secondary | ICD-10-CM | POA: Diagnosis not present

## 2021-04-11 DIAGNOSIS — I2510 Atherosclerotic heart disease of native coronary artery without angina pectoris: Secondary | ICD-10-CM | POA: Diagnosis not present

## 2021-04-11 DIAGNOSIS — N200 Calculus of kidney: Secondary | ICD-10-CM | POA: Diagnosis not present

## 2021-04-11 DIAGNOSIS — J449 Chronic obstructive pulmonary disease, unspecified: Secondary | ICD-10-CM | POA: Diagnosis not present

## 2021-04-11 DIAGNOSIS — R0602 Shortness of breath: Secondary | ICD-10-CM | POA: Diagnosis not present

## 2021-04-11 DIAGNOSIS — E1129 Type 2 diabetes mellitus with other diabetic kidney complication: Secondary | ICD-10-CM | POA: Diagnosis not present

## 2021-05-08 DIAGNOSIS — M79641 Pain in right hand: Secondary | ICD-10-CM | POA: Diagnosis not present

## 2021-05-08 DIAGNOSIS — J9601 Acute respiratory failure with hypoxia: Secondary | ICD-10-CM | POA: Diagnosis not present

## 2021-05-08 DIAGNOSIS — R200 Anesthesia of skin: Secondary | ICD-10-CM | POA: Diagnosis not present

## 2021-05-08 DIAGNOSIS — M79642 Pain in left hand: Secondary | ICD-10-CM | POA: Diagnosis not present

## 2021-05-08 DIAGNOSIS — M79671 Pain in right foot: Secondary | ICD-10-CM | POA: Diagnosis not present

## 2021-05-08 DIAGNOSIS — G479 Sleep disorder, unspecified: Secondary | ICD-10-CM | POA: Diagnosis not present

## 2021-05-08 DIAGNOSIS — M25569 Pain in unspecified knee: Secondary | ICD-10-CM | POA: Diagnosis not present

## 2021-05-08 DIAGNOSIS — M79672 Pain in left foot: Secondary | ICD-10-CM | POA: Diagnosis not present

## 2021-05-08 DIAGNOSIS — R202 Paresthesia of skin: Secondary | ICD-10-CM | POA: Diagnosis not present

## 2021-05-11 DIAGNOSIS — Z20822 Contact with and (suspected) exposure to covid-19: Secondary | ICD-10-CM | POA: Diagnosis not present

## 2021-05-11 DIAGNOSIS — J449 Chronic obstructive pulmonary disease, unspecified: Secondary | ICD-10-CM | POA: Diagnosis not present

## 2021-05-11 DIAGNOSIS — Z03818 Encounter for observation for suspected exposure to other biological agents ruled out: Secondary | ICD-10-CM | POA: Diagnosis not present

## 2021-05-21 DIAGNOSIS — M1711 Unilateral primary osteoarthritis, right knee: Secondary | ICD-10-CM | POA: Diagnosis not present

## 2021-06-11 DIAGNOSIS — J449 Chronic obstructive pulmonary disease, unspecified: Secondary | ICD-10-CM | POA: Diagnosis not present

## 2021-06-11 DIAGNOSIS — Z01818 Encounter for other preprocedural examination: Secondary | ICD-10-CM | POA: Diagnosis not present

## 2021-06-12 DIAGNOSIS — J449 Chronic obstructive pulmonary disease, unspecified: Secondary | ICD-10-CM | POA: Diagnosis not present

## 2021-06-12 DIAGNOSIS — I2510 Atherosclerotic heart disease of native coronary artery without angina pectoris: Secondary | ICD-10-CM | POA: Diagnosis not present

## 2021-06-12 DIAGNOSIS — E785 Hyperlipidemia, unspecified: Secondary | ICD-10-CM | POA: Diagnosis not present

## 2021-06-12 DIAGNOSIS — Z955 Presence of coronary angioplasty implant and graft: Secondary | ICD-10-CM | POA: Diagnosis not present

## 2021-06-12 DIAGNOSIS — I10 Essential (primary) hypertension: Secondary | ICD-10-CM | POA: Diagnosis not present

## 2021-06-24 IMAGING — MR MR CERVICAL SPINE W/O CM
5 series · 35 of 48 positions shown · non-contrast
Comparison: Prior radiograph from 02/12/2020.

CLINICAL DATA: Initial evaluation for acute left lateral neck pain
with radiation into the left upper extremity.

EXAM:
MRI CERVICAL SPINE WITHOUT CONTRAST
TECHNIQUE: Multiplanar, multisequence MR imaging of the cervical spine was
performed. No intravenous contrast was administered.

[Series 5: T2 · sagittal · 3.0mm · 0.62mm/px · 6 of 15 slices shown (1 of 2)]
[im 1/15]
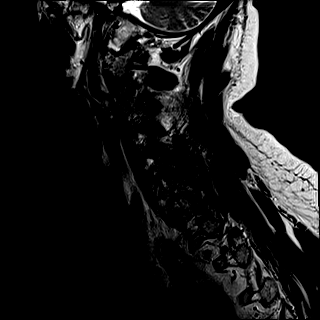
[im 3/15]
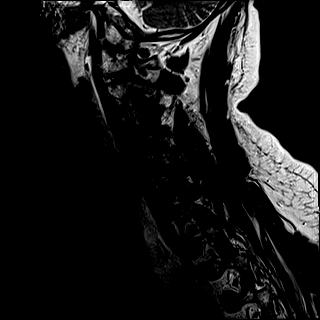
[im 6/15]
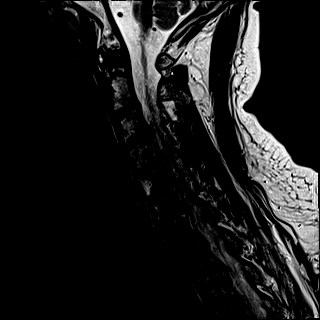
[im 9/15]
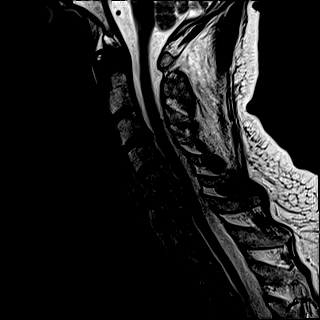
[im 12/15]
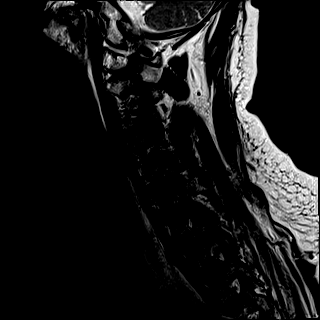
[im 15/15]
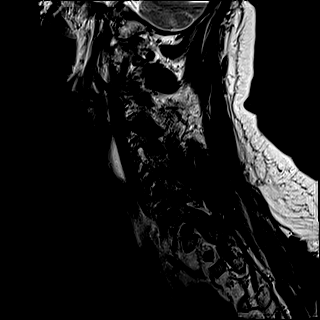

[Series 6: FLAIR · sagittal · 3.0mm · 0.78mm/px · 7 of 15 slices shown]
[im 1/15]
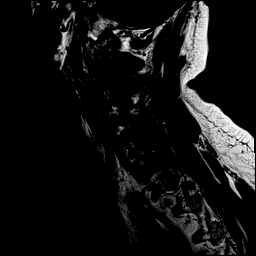
[im 3/15]
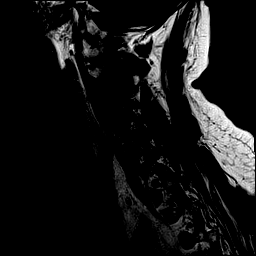
[im 5/15]
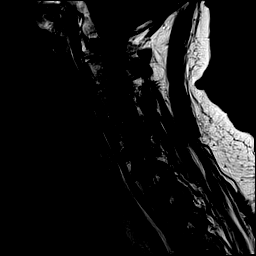
[im 8/15]
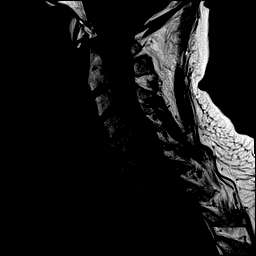
[im 10/15]
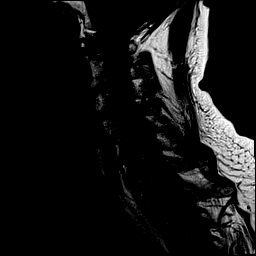
[im 12/15]
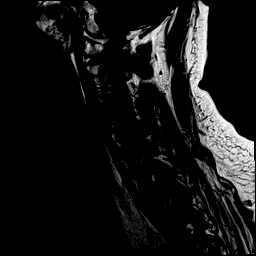
[im 15/15]
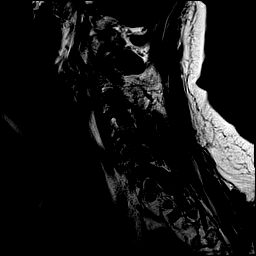

[Series 7: STIR · sagittal · 3.0mm · 0.62mm/px · 7 of 15 slices shown]
[im 1/15]
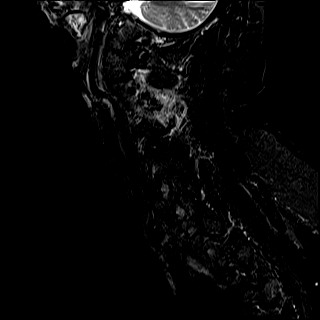
[im 3/15]
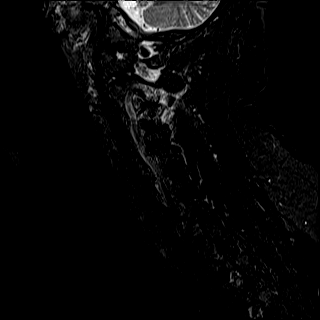
[im 5/15]
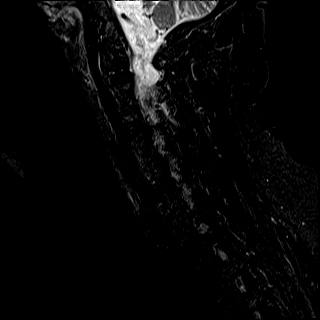
[im 8/15]
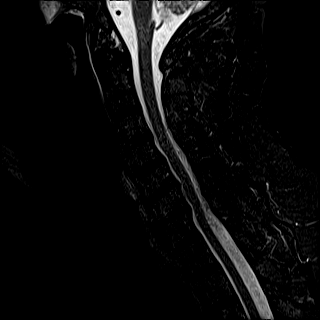
[im 10/15]
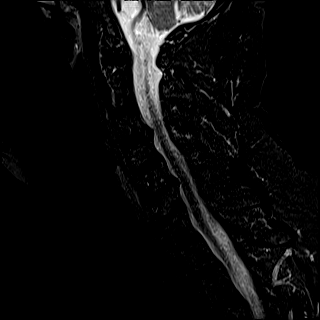
[im 12/15]
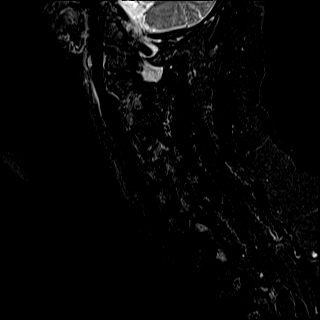
[im 15/15]
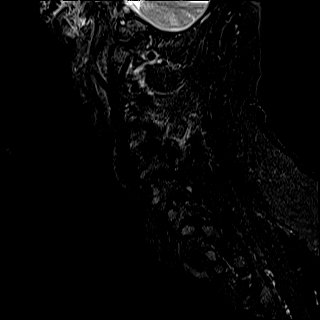

[Series 8: T2 · axial · 3.0mm · 0.70mm/px · z∈[-133,-35]mm · 8 of 31 slices shown (2 of 2)]
[im 1/31]
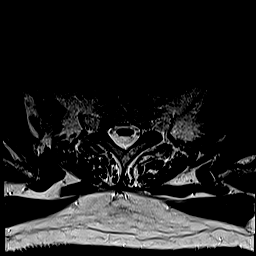
[im 5/31]
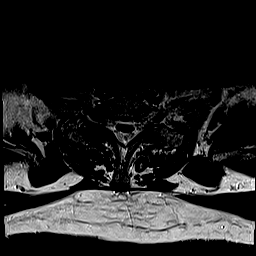
[im 10/31]
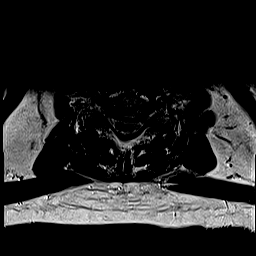
[im 14/31]
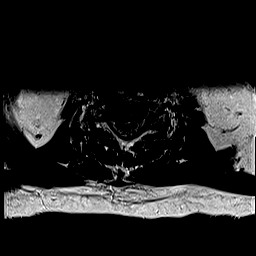
[im 17/31]
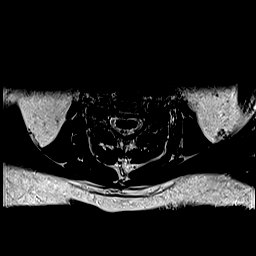
[im 21/31]
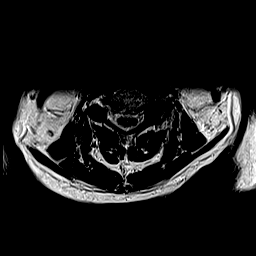
[im 26/31]
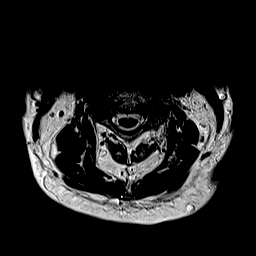
[im 31/31]
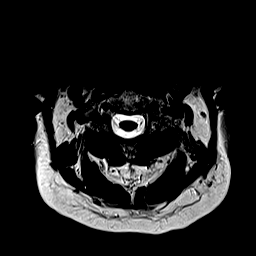

[Series 9: ax mpgr · axial · 3.0mm · 0.35mm/px · z∈[-133,-51]mm · 7 of 31 slices shown]
[im 1/31]
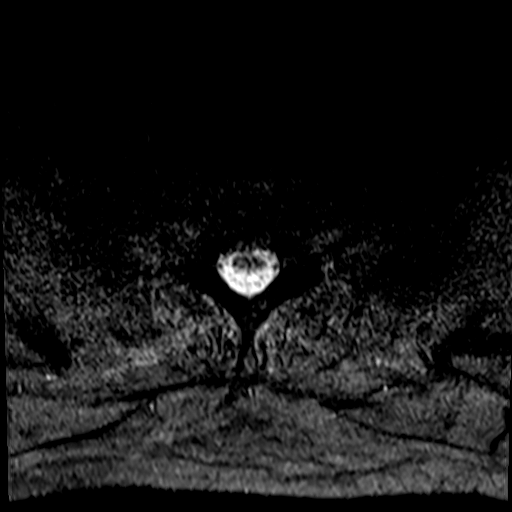
[im 5/31]
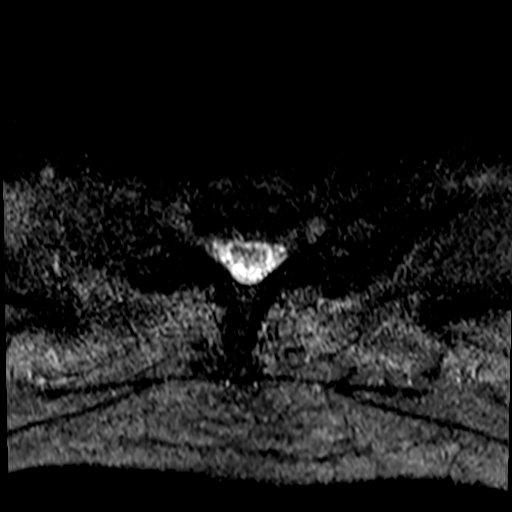
[im 10/31]
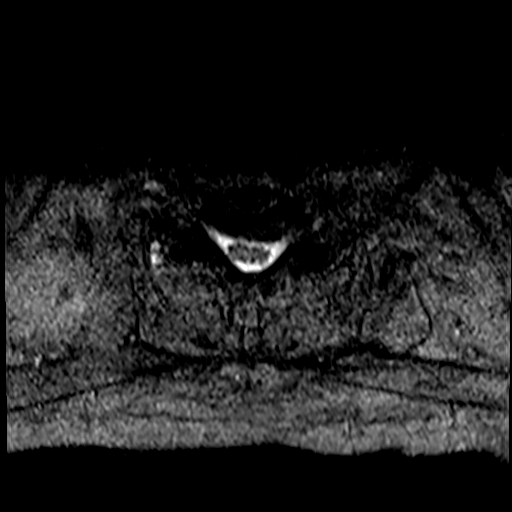
[im 14/31]
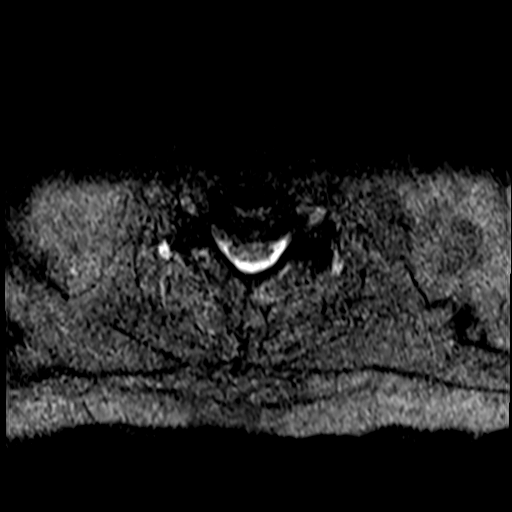
[im 17/31]
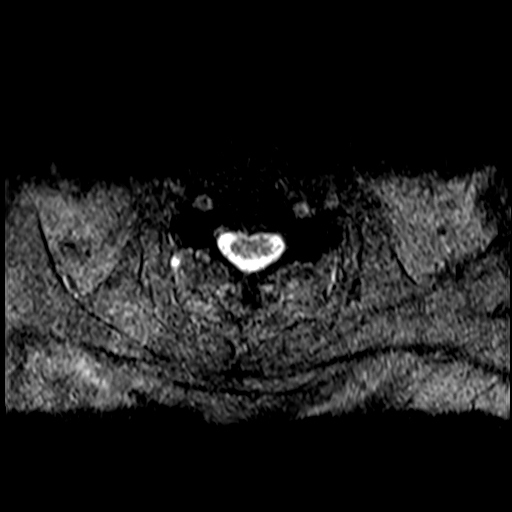
[im 21/31]
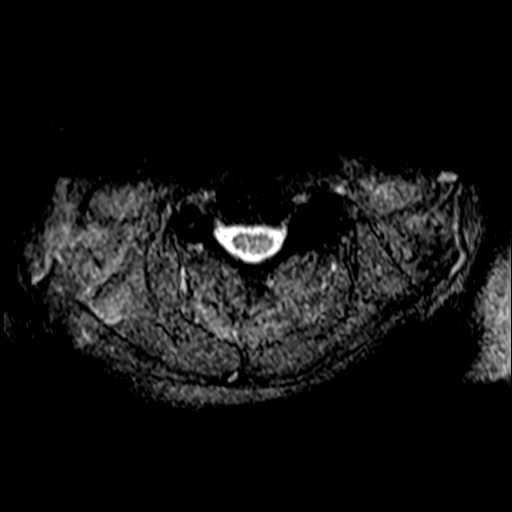
[im 26/31]
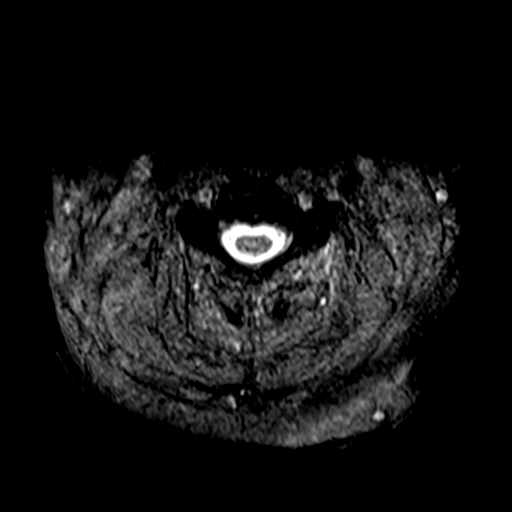

[35 of 48 positions shown; findings below may reference images not displayed]

FINDINGS: Alignment: Straightening with mild reversal of the normal cervical
lordosis. Trace retrolisthesis of C5 on C6 and C6 on C7, with trace
anterolisthesis of C7 on T1. Findings chronic and facet mediated.

Vertebrae: Vertebral body height maintained without acute or chronic
fracture. Bone marrow signal intensity normal. No discrete or
worrisome osseous lesions. Prominent reactive marrow edema seen
about the left C2-3 facet due to facet arthritis (series 7, image
14). Finding suspected to be the source of patient's left-sided neck
pain. More mild reactive marrow edema seen about the right C2-3
facet. Minor reactive endplate change noted about the C5-6 and C6-7
interspaces. No other abnormal marrow edema.

Cord: Normal signal and morphology.

Posterior Fossa, vertebral arteries, paraspinal tissues: Visualized
brain and posterior fossa within normal limits. Craniocervical
junction normal. Mild soft tissue edema about the inflamed left C2-3
facet. Paraspinous soft tissues otherwise within normal limits.
Normal flow voids seen within the vertebral arteries bilaterally.

Disc levels:

C2-C3: Tiny central disc protrusion minimally indents the ventral
thecal sac. Mild left greater than right uncovertebral spurring.
Severe left with mild right facet hypertrophy with prominent
reactive marrow edema on the left. No spinal stenosis. Mild left C3
foraminal narrowing. Right neural foramen widely patent.

C3-C4: Diffuse disc bulge with bilateral uncovertebral hypertrophy.
Broad posterior disc osteophyte flattens and partially faces the
ventral thecal sac with resultant mild spinal stenosis. No cord
impingement. Superimposed moderate bilateral facet hypertrophy.
Severe bilateral C4 foraminal stenosis.

C4-C5: Disc bulge with bilateral uncovertebral hypertrophy. Severe
left with mild right facet degeneration. Posterior disc osteophyte
mildly flattens the ventral thecal sac with no more than mild spinal
stenosis. No cord impingement. Moderate left with mild right C5
foraminal narrowing.

C5-C6: Degenerative intervertebral disc space narrowing with diffuse
disc osteophyte complex. Broad posterior component flattens and
mildly effaces the ventral thecal sac with resultant mild spinal
stenosis. Minimal flattening of the ventral spinal cord.
Superimposed mild bilateral facet hypertrophy. Severe right with
mild-to-moderate left C6 foraminal narrowing.

C6-C7: Degenerative intervertebral disc space narrowing with diffuse
disc osteophyte complex. Broad posterior component flattens and
mildly effaces the ventral thecal sac with resultant mild spinal
stenosis. No significant cord deformity. Moderate left worse than
right left C7 foraminal stenosis.

C7-T1: Trace anterolisthesis. No significant disc bulge. Moderate
right with mild left facet hypertrophy. No canal or foraminal
stenosis.

Visualized upper thoracic spine demonstrates no significant finding.
IMPRESSION: 1. Prominent reactive marrow edema about the left C2-3 facet due to
facet arthritis. Finding suspected to be the source of patient's
left-sided neck pain.
2. Multilevel cervical spondylosis with resultant mild diffuse
spinal stenosis at C3-4 through C6-7.
3. Multifactorial degenerative changes with resultant multilevel
foraminal narrowing as above. Notable findings include severe
bilateral C4 foraminal stenosis, moderate left C5 foraminal
narrowing, severe right C6 foraminal stenosis, with moderate left
worse than right bilateral C7 foraminal narrowing.

## 2021-07-03 DIAGNOSIS — L989 Disorder of the skin and subcutaneous tissue, unspecified: Secondary | ICD-10-CM | POA: Diagnosis not present

## 2021-07-24 DIAGNOSIS — L989 Disorder of the skin and subcutaneous tissue, unspecified: Secondary | ICD-10-CM | POA: Diagnosis not present

## 2021-07-29 DIAGNOSIS — R059 Cough, unspecified: Secondary | ICD-10-CM | POA: Diagnosis not present

## 2021-07-29 DIAGNOSIS — R079 Chest pain, unspecified: Secondary | ICD-10-CM | POA: Diagnosis not present

## 2021-07-29 DIAGNOSIS — Z8709 Personal history of other diseases of the respiratory system: Secondary | ICD-10-CM | POA: Diagnosis not present

## 2021-07-29 DIAGNOSIS — J22 Unspecified acute lower respiratory infection: Secondary | ICD-10-CM | POA: Diagnosis not present

## 2021-07-29 DIAGNOSIS — Z20822 Contact with and (suspected) exposure to covid-19: Secondary | ICD-10-CM | POA: Diagnosis not present

## 2021-08-08 DIAGNOSIS — M170 Bilateral primary osteoarthritis of knee: Secondary | ICD-10-CM | POA: Diagnosis not present

## 2021-08-08 DIAGNOSIS — M19041 Primary osteoarthritis, right hand: Secondary | ICD-10-CM | POA: Diagnosis not present

## 2021-08-08 DIAGNOSIS — J449 Chronic obstructive pulmonary disease, unspecified: Secondary | ICD-10-CM | POA: Diagnosis not present

## 2021-08-08 DIAGNOSIS — M539 Dorsopathy, unspecified: Secondary | ICD-10-CM | POA: Diagnosis not present

## 2021-08-08 DIAGNOSIS — M19042 Primary osteoarthritis, left hand: Secondary | ICD-10-CM | POA: Diagnosis not present

## 2021-09-02 ENCOUNTER — Encounter: Payer: Self-pay | Admitting: Emergency Medicine

## 2021-09-02 ENCOUNTER — Emergency Department
Admission: EM | Admit: 2021-09-02 | Discharge: 2021-09-02 | Disposition: A | Discharge status: Home / Self Care | Payer: Medicare HMO | Attending: Emergency Medicine | Admitting: Emergency Medicine

## 2021-09-02 ENCOUNTER — Emergency Department: Payer: Medicare HMO

## 2021-09-02 ENCOUNTER — Other Ambulatory Visit: Payer: Self-pay

## 2021-09-02 DIAGNOSIS — R0602 Shortness of breath: Secondary | ICD-10-CM | POA: Diagnosis not present

## 2021-09-02 DIAGNOSIS — R079 Chest pain, unspecified: Secondary | ICD-10-CM | POA: Diagnosis not present

## 2021-09-02 DIAGNOSIS — J449 Chronic obstructive pulmonary disease, unspecified: Secondary | ICD-10-CM | POA: Diagnosis not present

## 2021-09-02 DIAGNOSIS — I129 Hypertensive chronic kidney disease with stage 1 through stage 4 chronic kidney disease, or unspecified chronic kidney disease: Secondary | ICD-10-CM | POA: Diagnosis not present

## 2021-09-02 DIAGNOSIS — E1122 Type 2 diabetes mellitus with diabetic chronic kidney disease: Secondary | ICD-10-CM | POA: Insufficient documentation

## 2021-09-02 DIAGNOSIS — N183 Chronic kidney disease, stage 3 unspecified: Secondary | ICD-10-CM | POA: Insufficient documentation

## 2021-09-02 DIAGNOSIS — U071 COVID-19: Secondary | ICD-10-CM | POA: Insufficient documentation

## 2021-09-02 DIAGNOSIS — I2510 Atherosclerotic heart disease of native coronary artery without angina pectoris: Secondary | ICD-10-CM | POA: Diagnosis not present

## 2021-09-02 DIAGNOSIS — E1165 Type 2 diabetes mellitus with hyperglycemia: Secondary | ICD-10-CM | POA: Diagnosis not present

## 2021-09-02 LAB — CBC WITH DIFFERENTIAL/PLATELET
Abs Immature Granulocytes: 0.03 10*3/uL (ref 0.00–0.07)
Basophils Absolute: 0.1 10*3/uL (ref 0.0–0.1)
Basophils Relative: 1 %
Eosinophils Absolute: 0.2 10*3/uL (ref 0.0–0.5)
Eosinophils Relative: 3 %
HCT: 45.7 % (ref 39.0–52.0)
Hemoglobin: 13.9 g/dL (ref 13.0–17.0)
Immature Granulocytes: 0 %
Lymphocytes Relative: 17 %
Lymphs Abs: 1.5 10*3/uL (ref 0.7–4.0)
MCH: 28.3 pg (ref 26.0–34.0)
MCHC: 30.4 g/dL (ref 30.0–36.0)
MCV: 93.1 fL (ref 80.0–100.0)
Monocytes Absolute: 0.9 10*3/uL (ref 0.1–1.0)
Monocytes Relative: 11 %
Neutro Abs: 5.8 10*3/uL (ref 1.7–7.7)
Neutrophils Relative %: 68 %
Platelets: 205 10*3/uL (ref 150–400)
RBC: 4.91 MIL/uL (ref 4.22–5.81)
RDW: 14.7 % (ref 11.5–15.5)
WBC: 8.6 10*3/uL (ref 4.0–10.5)
nRBC: 0 % (ref 0.0–0.2)

## 2021-09-02 LAB — TROPONIN I (HIGH SENSITIVITY)
Troponin I (High Sensitivity): 7 ng/L (ref <18)
Troponin I (High Sensitivity): 6 ng/L (ref <18)

## 2021-09-02 LAB — COMPREHENSIVE METABOLIC PANEL
ALT: 26 U/L (ref 0–44)
AST: 32 U/L (ref 15–41)
Albumin: 4.2 g/dL (ref 3.5–5.0)
Alkaline Phosphatase: 63 U/L (ref 38–126)
Anion gap: 10 (ref 5–15)
BUN: 20 mg/dL (ref 8–23)
CO2: 26 mmol/L (ref 22–32)
Calcium: 10.1 mg/dL (ref 8.9–10.3)
Chloride: 99 mmol/L (ref 98–111)
Creatinine, Ser: 1.1 mg/dL (ref 0.61–1.24)
GFR, Estimated: >60 mL/min (ref >60)
Glucose, Bld: 171 mg/dL — ABNORMAL HIGH (ref 70–99)
Potassium: 4.3 mmol/L (ref 3.5–5.1)
Sodium: 135 mmol/L (ref 135–145)
Total Bilirubin: 0.6 mg/dL (ref 0.3–1.2)
Total Protein: 7.5 g/dL (ref 6.5–8.1)

## 2021-09-02 LAB — RESP PANEL BY RT-PCR (FLU A&B, COVID) ARPGX2
Influenza A by PCR: NEGATIVE
Influenza B by PCR: NEGATIVE
SARS Coronavirus 2 by RT PCR: POSITIVE — AB

## 2021-09-02 MED ORDER — IOHEXOL 350 MG/ML SOLN
75.0000 mL | Freq: Once | INTRAVENOUS | Status: AC | PRN
  Administered 2021-09-02: 75 mL via INTRAVENOUS

## 2021-09-02 MED ORDER — NIRMATRELVIR/RITONAVIR (PAXLOVID) TABLET (RENAL DOSING)
2.0000 | ORAL_TABLET | Freq: Two times a day (BID) | ORAL | 0 refills | Status: AC
Start: 2021-09-02 — End: 2021-09-07

## 2021-09-02 MED ORDER — PREDNISONE 10 MG PO TABS: ORAL_TABLET | ORAL | 0 refills | Status: AC

## 2021-09-02 NOTE — ED Triage Notes (Signed)
Pt reports CP to mid chest that is pressure like in nature and non radiating. Pt also reports an increase in SOB. Pt on O2 at all times. Pt with hx of MI

## 2021-09-02 NOTE — ED Notes (Signed)
Patient assisted to stand and sit in recliner chair for more comfort.   No current complaints.   Updated on plan of care

## 2021-09-02 NOTE — Discharge Instructions (Addendum)
-  Take the Paxlovid and prednisone as prescribed.  You may utilize your oxygen and albuterol inhaler at home as needed  -Do not take your fluticasone-salmeterol (Advair) while on the Paxlovid treatment, as it may result in an adverse reaction.  -Follow-up with your primary care provider or pulmonologist within the next 3 to 5 days to ensure improvement.  -Avoid contact with other people for the next 5 days to prevent further spread of infection.  -Return to the emergency department anytime if you begin to experience any new or worsening symptoms.

## 2021-09-02 NOTE — ED Provider Notes (Signed)
[facility] Provider Note    Event Date/Time   First MD Initiated Contact with Patient 09/02/21 1619     (approximate)   History   Chief Complaint Chest Pain   HPI Henry [name] is a 76 y.o. male, history of diabetes, hypertension, hyperlipidemia, COPD (on 2 L at home), CAD (prior MI), CKD stage III, presents to the emergency department for evaluation of shortness of breath.  Patient states that earlier today he began to feel generally unwell with increased shortness of breath.  He states that it feels similar to the time when he had pneumonia.  Reports some mid chest discomfort as well.  He states that otherwise he has felt fine the past several days.  Denies any recent illnesses or injuries.  Denies any sick exposures.  Denies fever/chills, abdominal pain, flank pain, nausea/vomiting, diarrhea, dysuria, headache, rash/lesions, or dizziness/lightheadedness  History Limitations: No limitations.        Physical Exam  Triage Vital Signs: ED Triage Vitals  Enc Vitals Group     BP 09/02/21 1620 (!) 179/97     Pulse Rate 09/02/21 1620 87     Resp 09/02/21 1620 20     Temp 09/02/21 1620 97.6 F (36.4 C)     Temp Source 09/02/21 1620 Oral     SpO2 09/02/21 1620 97 %     Weight 09/02/21 1611 225 lb (102.1 kg)     Height 09/02/21 1611 6' (1.829 m)     Head Circumference --      Peak Flow --      Pain Score 09/02/21 1611 7     Pain Loc --      Pain Edu? --      Excl. in [place]? --     Most recent vital signs: Vitals:   09/02/21 1900 09/02/21 2000  BP: 112/65 108/79  Pulse: 69 63  Resp: 13 14  Temp:    SpO2: 95% 94%    General: Awake, NAD.  Speaking in full sentences. Skin: Warm, dry. No rashes or lesions.  Eyes: PERRL. Conjunctivae normal.  CV: Good peripheral perfusion.  Resp: Normal effort.  Lung sounds are clear. Abd: Soft, non-tender. No distention.  Neuro: At baseline. No gross neurological deficits.   Focused Exam: N/A  Physical  Exam    ED Results / Procedures / Treatments  Labs (all labs ordered are listed, but only abnormal results are displayed) Labs Reviewed  RESP PANEL BY RT-PCR (FLU A&B, COVID) ARPGX2 - Abnormal; Notable for the following components:      Result Value   SARS Coronavirus 2 by RT PCR POSITIVE (*)    All other components within normal limits  COMPREHENSIVE METABOLIC PANEL - Abnormal; Notable for the following components:   Glucose, Bld 171 (*)    All other components within normal limits  CBC WITH DIFFERENTIAL/PLATELET  TROPONIN I (HIGH SENSITIVITY)  TROPONIN I (HIGH SENSITIVITY)     EKG Sinus rhythm, rate of 87, PACs present intermittently (consistent with baseline), no significant ST segment changes, no AV blocks, normal QRS interval.   RADIOLOGY  ED Provider Interpretation: I personally reviewed and interpreted these images, no acute process noted on chest x-ray.  CT PE shows no evidence of pulmonary embolism.  DG Chest 2 View  Result Date: 09/02/2021 CLINICAL DATA:  Chest pain.  Shortness of breath. EXAM: CHEST - 2 VIEW COMPARISON:  Chest two views 02/10/2021 FINDINGS: Cardiac silhouette and mediastinal contours within normal limits with mild-to-moderate  calcification within the aortic arch. Right basilar greater than left basilar linear subsegmental atelectasis versus scarring. No pleural effusion or pneumothorax. No acute skeletal abnormality. IMPRESSION: Chronic bibasilar linear scarring.  No acute lung process. Electronically Signed   By: Yvonne Kendall M.D.   On: 09/02/2021 17:02   CT Angio Chest PE W/Cm &/Or Wo Cm  Result Date: 09/02/2021 CLINICAL DATA:  Chest pain, shortness of breath EXAM: CT ANGIOGRAPHY CHEST WITH CONTRAST TECHNIQUE: Multidetector CT imaging of the chest was performed using the standard protocol during bolus administration of intravenous contrast. Multiplanar CT image reconstructions and MIPs were obtained to evaluate the vascular anatomy. RADIATION DOSE  REDUCTION: This exam was performed according to the departmental dose-optimization program which includes automated exposure control, adjustment of the mA and/or kV according to patient size and/or use of iterative reconstruction technique. CONTRAST:  6m OMNIPAQUE IOHEXOL 350 MG/ML SOLN COMPARISON:  Chest radiograph dated 08/25/2021. CTA chest dated 02/11/2021. FINDINGS: Cardiovascular: Satisfactory opacification of the bilateral pulmonary arteries to the segmental level. No evidence of pulmonary embolism. Although not tailored for evaluation of the thoracic aorta, there is no evidence thoracic aortic aneurysm or dissection. Atherosclerotic calcifications of the aortic arch. The heart is top-normal in size.  No pericardial effusion. Three vessel coronary atherosclerosis. Mediastinum/Nodes: No suspicious mediastinal lymphadenopathy. Visualized thyroid is unremarkable. Lungs/Pleura: Evaluation of the lung parenchyma is constrained by respiratory motion. Within that constraint, there are no suspicious pulmonary nodules. Scattered mild scarring in the posterior left upper lobe, inferior right middle lobe, and bilateral lower lobes. This appearance is unchanged from the prior. No focal consolidation. Moderate centrilobular and paraseptal emphysematous changes, upper lung predominant. No pleural effusion or pneumothorax. Upper Abdomen: Visualized upper abdomen is notable for multiple nonobstructing renal calculi measuring up to 12 mm in the left upper kidney, bilateral renal cysts, and a 4.6 cm central left hepatic lobe cysts. Vascular calcifications. Musculoskeletal: Visualized osseous structures are within normal limits. Review of the MIP images confirms the above findings. IMPRESSION: No evidence of pulmonary embolism. Additional stable ancillary findings as above. Aortic Atherosclerosis (ICD10-I70.0) and Emphysema (ICD10-J43.9). Electronically Signed   By: SJulian HyM.D.   On: 09/02/2021 18:07     PROCEDURES:  Critical Care performed: N/A.  Procedures    MEDICATIONS ORDERED IN ED: Medications  iohexol (OMNIPAQUE) 350 MG/ML injection 75 mL (75 mLs Intravenous Contrast Given 09/02/21 1753)     IMPRESSION / MDM / ASSESSMENT AND PLAN / ED COURSE  I reviewed the triage vital signs and the nursing notes.                              Differential diagnosis includes, but is not limited to, ACS, COPD exacerbation, COVID-19, influenza, pneumonia, pulmonary embolism.  ED Course Patient appears well, NAD.  Speaking in full sentences.  Currently afebrile.  CBC shows no leukocytosis or anemia.  CMP shows elevated glucose at 171, otherwise no evidence of electrolyte abnormalities, transaminitis, or kidney injury.  Initial troponin 7.  Second troponin 6.  Unlikely ACS.  Assessment/Plan Patient presents with shortness of breath/chest discomfort x1 day.  Respiratory panel positive for COVID-19.  Lab work-up has been reassuring.  CT PE shows no evidence of pulmonary embolism or focal consolidations.  He is speaking in full sentences at this time.  Currently asymptomatic.  Low suspicion for any serious or life-threatening secondary pathology.  Will prescribe him Paxlovid and prednisone.  Advised him to follow-up with his  primary care provider within the next 3 to 5 days to ensure no worsening of symptoms.  We will plan to discharge.  Considered admission for this patient, but given his stable presentation, unremarkable work-up, he is unlikely to benefit  Provided the patient with anticipatory guidance, return precautions, and educational material. Encouraged the patient to return to the emergency department at any time if they begin to experience any new or worsening symptoms. Patient expressed understanding and agreed with the plan.       FINAL CLINICAL IMPRESSION(S) / ED DIAGNOSES   Final diagnoses:  [id number]     Rx / DC Orders   ED Discharge Orders          Ordered     nirmatrelvir/ritonavir EUA, renal dosing, (PAXLOVID) 10 x 150 MG & 10 x '100MG'$  TABS  2 times daily        09/02/21 2037    predniSONE (DELTASONE) 10 MG tablet        09/02/21 2037             Note:  This document was prepared using Dragon voice recognition software and may include unintentional dictation errors.   Teodoro Spray, [place] 09/02/21 2043    Nena Polio, MD 09/02/21 2053

## 2021-10-01 DIAGNOSIS — M79671 Pain in right foot: Secondary | ICD-10-CM | POA: Diagnosis not present

## 2021-10-01 DIAGNOSIS — R200 Anesthesia of skin: Secondary | ICD-10-CM | POA: Diagnosis not present

## 2021-10-01 DIAGNOSIS — G479 Sleep disorder, unspecified: Secondary | ICD-10-CM | POA: Diagnosis not present

## 2021-10-01 DIAGNOSIS — M79672 Pain in left foot: Secondary | ICD-10-CM | POA: Diagnosis not present

## 2021-10-01 DIAGNOSIS — G63 Polyneuropathy in diseases classified elsewhere: Secondary | ICD-10-CM | POA: Diagnosis not present

## 2021-10-01 DIAGNOSIS — M25569 Pain in unspecified knee: Secondary | ICD-10-CM | POA: Diagnosis not present

## 2021-10-01 DIAGNOSIS — R202 Paresthesia of skin: Secondary | ICD-10-CM | POA: Diagnosis not present

## 2021-10-16 DIAGNOSIS — R0602 Shortness of breath: Secondary | ICD-10-CM | POA: Diagnosis not present

## 2021-10-16 DIAGNOSIS — E785 Hyperlipidemia, unspecified: Secondary | ICD-10-CM | POA: Diagnosis not present

## 2021-10-16 DIAGNOSIS — I2510 Atherosclerotic heart disease of native coronary artery without angina pectoris: Secondary | ICD-10-CM | POA: Diagnosis not present

## 2021-10-16 DIAGNOSIS — I491 Atrial premature depolarization: Secondary | ICD-10-CM | POA: Diagnosis not present

## 2021-10-16 DIAGNOSIS — I2109 ST elevation (STEMI) myocardial infarction involving other coronary artery of anterior wall: Secondary | ICD-10-CM | POA: Diagnosis not present

## 2021-10-16 DIAGNOSIS — Z87891 Personal history of nicotine dependence: Secondary | ICD-10-CM | POA: Diagnosis not present

## 2021-10-16 DIAGNOSIS — I10 Essential (primary) hypertension: Secondary | ICD-10-CM | POA: Diagnosis not present

## 2021-10-16 DIAGNOSIS — Z955 Presence of coronary angioplasty implant and graft: Secondary | ICD-10-CM | POA: Diagnosis not present

## 2021-11-15 DIAGNOSIS — J441 Chronic obstructive pulmonary disease with (acute) exacerbation: Secondary | ICD-10-CM | POA: Diagnosis not present

## 2021-11-15 DIAGNOSIS — N183 Chronic kidney disease, stage 3 unspecified: Secondary | ICD-10-CM | POA: Diagnosis not present

## 2021-12-16 DIAGNOSIS — N183 Chronic kidney disease, stage 3 unspecified: Secondary | ICD-10-CM | POA: Diagnosis not present

## 2021-12-16 DIAGNOSIS — J441 Chronic obstructive pulmonary disease with (acute) exacerbation: Secondary | ICD-10-CM | POA: Diagnosis not present

## 2021-12-20 ENCOUNTER — Other Ambulatory Visit: Payer: Self-pay

## 2021-12-20 ENCOUNTER — Emergency Department: Payer: Medicare HMO

## 2021-12-20 ENCOUNTER — Emergency Department
Admission: EM | Admit: 2021-12-20 | Discharge: 2021-12-20 | Disposition: A | Discharge status: Home / Self Care | Payer: Medicare HMO | Attending: Emergency Medicine | Admitting: Emergency Medicine

## 2021-12-20 DIAGNOSIS — R109 Unspecified abdominal pain: Secondary | ICD-10-CM | POA: Diagnosis not present

## 2021-12-20 DIAGNOSIS — I2510 Atherosclerotic heart disease of native coronary artery without angina pectoris: Secondary | ICD-10-CM | POA: Diagnosis not present

## 2021-12-20 DIAGNOSIS — I129 Hypertensive chronic kidney disease with stage 1 through stage 4 chronic kidney disease, or unspecified chronic kidney disease: Secondary | ICD-10-CM | POA: Insufficient documentation

## 2021-12-20 DIAGNOSIS — R42 Dizziness and giddiness: Secondary | ICD-10-CM | POA: Diagnosis not present

## 2021-12-20 DIAGNOSIS — Z79899 Other long term (current) drug therapy: Secondary | ICD-10-CM | POA: Insufficient documentation

## 2021-12-20 DIAGNOSIS — R531 Weakness: Secondary | ICD-10-CM | POA: Diagnosis not present

## 2021-12-20 DIAGNOSIS — R110 Nausea: Secondary | ICD-10-CM | POA: Insufficient documentation

## 2021-12-20 DIAGNOSIS — N189 Chronic kidney disease, unspecified: Secondary | ICD-10-CM | POA: Diagnosis not present

## 2021-12-20 DIAGNOSIS — J449 Chronic obstructive pulmonary disease, unspecified: Secondary | ICD-10-CM | POA: Diagnosis not present

## 2021-12-20 DIAGNOSIS — K4020 Bilateral inguinal hernia, without obstruction or gangrene, not specified as recurrent: Secondary | ICD-10-CM | POA: Diagnosis not present

## 2021-12-20 DIAGNOSIS — N200 Calculus of kidney: Secondary | ICD-10-CM | POA: Diagnosis not present

## 2021-12-20 LAB — HEPATIC FUNCTION PANEL
ALT: 30 U/L (ref 0–44)
AST: 33 U/L (ref 15–41)
Albumin: 4.3 g/dL (ref 3.5–5.0)
Alkaline Phosphatase: 65 U/L (ref 38–126)
Bilirubin, Direct: <0.1 mg/dL (ref 0.0–0.2)
Total Bilirubin: 0.5 mg/dL (ref 0.3–1.2)
Total Protein: 7.4 g/dL (ref 6.5–8.1)

## 2021-12-20 LAB — TROPONIN I (HIGH SENSITIVITY): Troponin I (High Sensitivity): 4 ng/L (ref <18)

## 2021-12-20 LAB — CBC
HCT: 50.4 % (ref 39.0–52.0)
Hemoglobin: 15.8 g/dL (ref 13.0–17.0)
MCH: 28.2 pg (ref 26.0–34.0)
MCHC: 31.3 g/dL (ref 30.0–36.0)
MCV: 89.8 fL (ref 80.0–100.0)
Platelets: 275 10*3/uL (ref 150–400)
RBC: 5.61 MIL/uL (ref 4.22–5.81)
RDW: 14.2 % (ref 11.5–15.5)
WBC: 9.6 10*3/uL (ref 4.0–10.5)
nRBC: 0 % (ref 0.0–0.2)

## 2021-12-20 LAB — URINE DRUG SCREEN, QUALITATIVE (ARMC ONLY)
Amphetamines, Ur Screen: NOT DETECTED
Barbiturates, Ur Screen: NOT DETECTED
Benzodiazepine, Ur Scrn: NOT DETECTED
Cannabinoid 50 Ng, Ur ~~LOC~~: NOT DETECTED
Cocaine Metabolite,Ur ~~LOC~~: NOT DETECTED
MDMA (Ecstasy)Ur Screen: NOT DETECTED
Methadone Scn, Ur: NOT DETECTED
Opiate, Ur Screen: POSITIVE — AB
Phencyclidine (PCP) Ur S: NOT DETECTED
Tricyclic, Ur Screen: NOT DETECTED

## 2021-12-20 LAB — BASIC METABOLIC PANEL
Anion gap: 13 (ref 5–15)
BUN: 23 mg/dL (ref 8–23)
CO2: 28 mmol/L (ref 22–32)
Calcium: 10.4 mg/dL — ABNORMAL HIGH (ref 8.9–10.3)
Chloride: 100 mmol/L (ref 98–111)
Creatinine, Ser: 1.27 mg/dL — ABNORMAL HIGH (ref 0.61–1.24)
GFR, Estimated: 59 mL/min — ABNORMAL LOW (ref >60)
Glucose, Bld: 215 mg/dL — ABNORMAL HIGH (ref 70–99)
Potassium: 4.1 mmol/L (ref 3.5–5.1)
Sodium: 141 mmol/L (ref 135–145)

## 2021-12-20 LAB — URINALYSIS, ROUTINE W REFLEX MICROSCOPIC
Bacteria, UA: NONE SEEN
Bilirubin Urine: NEGATIVE
Glucose, UA: >=500 mg/dL — AB
Hgb urine dipstick: NEGATIVE
Ketones, ur: NEGATIVE mg/dL
Leukocytes,Ua: NEGATIVE
Nitrite: NEGATIVE
Protein, ur: 30 mg/dL — AB
Specific Gravity, Urine: 1.021 (ref 1.005–1.030)
pH: 6 (ref 5.0–8.0)

## 2021-12-20 NOTE — ED Provider Notes (Signed)
[facility] Provider Note    Event Date/Time   First MD Initiated Contact with Patient 12/20/21 1537     (approximate)  History   Chief Complaint: Dizziness and Flank Pain  HPI  Henry [name] is a 76 y.o. male with a past medical history of CAD, CKD, COPD, hypertension, hyperlipidemia, prior MI with stents in 2019 who presents to the emergency department for generalized weakness and nausea.  According to the patient for the last 2 days he has been feeling somewhat dizzy/lightheadedness at times nauseated.  Patient also states for the past 2 days he has been experiencing intermittent right-sided flank pain.  States a long history of kidney stones.  Denies any hematuria or dysuria.  No fever.    Physical Exam   Triage Vital Signs: ED Triage Vitals  Enc Vitals Group     BP 12/20/21 1340 (!) 146/81     Pulse Rate 12/20/21 1340 91     Resp 12/20/21 1340 18     Temp 12/20/21 1340 98 F (36.7 C)     Temp Source 12/20/21 1340 Oral     SpO2 12/20/21 1340 95 %     Weight 12/20/21 1341 225 lb (102.1 kg)     Height --      Head Circumference --      Peak Flow --      Pain Score 12/20/21 1340 7     Pain Loc --      Pain Edu? --      Excl. in [place]? --     Most recent vital signs: Vitals:   12/20/21 1340 12/20/21 1539  BP: (!) 146/81 139/82  Pulse: 91 78  Resp: 18 17  Temp: 98 F (36.7 C)   SpO2: 95% 98%    General: Awake, no distress.  CV:  Good peripheral perfusion.  Regular rate and rhythm  Resp:  Normal effort.  Equal breath sounds bilaterally.  Abd:  No distention.  Soft, nontender.  No rebound or guarding.   ED Results / Procedures / Treatments   EKG  EKG viewed and interpreted by myself shows a normal sinus rhythm at 70 bpm with a narrow QRS, normal axis, normal intervals, nonspecific ST changes.  RADIOLOGY  I have reviewed and interpreted the CT images I do not see any signs of bowel obstruction on my evaluation. Radiology has read  the CT scan as renal stones but no acute finding.   MEDICATIONS ORDERED IN ED: Medications - No data to display   IMPRESSION / MDM / [place] / ED COURSE  I reviewed the triage vital signs and the nursing notes.  Patient's presentation is most consistent with acute presentation with potential threat to life or bodily function.  Patient presents emergency department for generalized weakness fatigue dizziness as well as intermittent nausea and right flank pain.  Patient states approximately 2 days ago he started taking CBD oil for neuropathy type pain.  States is the first time he is ever used this.  He states shortly after using CBD oil he began experiencing nausea and lightheadedness.  He is not sure if the 2 are related.  Given his past medical history he was concerned so he came to the emergency department for evaluation.  Overall patient appears well on my exam, no concerning findings, benign abdomen, reassuring vitals clear lung sounds with normal heart sounds.  We will check labs including cardiac enzymes, EKG, urinalysis and a CT renal scan for  possible right-sided ureterolithiasis.  Differential would include CBD reaction, metabolic or electrolyte abnormality, ACS, dehydration, infectious etiology.  Patient's lab work is overall reassuring.,  Opiate positive urine drug screen which the patient takes hydrocodone at baseline.  Chemistry is largely baseline including mild renal insufficiency.  CBC is normal urinalysis shows no concern for UTI.  Troponin is negative and EKG reassuring.  I discussed with the patient given the patient's symptoms and onset shortly after starting CBD oil to discontinue CBD oil until he speaks to his physician.  Patient agreeable to plan of care.  We will discharge patient with PCP follow-up.  FINAL CLINICAL IMPRESSION(S) / ED DIAGNOSES   Weakness Nausea Flank pain   Note:  This document was prepared using Dragon voice recognition software and may  include unintentional dictation errors.   Harvest Dark, MD 12/20/21 1649

## 2021-12-20 NOTE — Discharge Instructions (Addendum)
As we discussed I would recommend discontinuing the CBD oil until you have spoken to your physician.  Please drink plenty of fluids, return to the emergency department for any symptom personally concerning to yourself.

## 2021-12-20 NOTE — ED Triage Notes (Signed)
PT coming in POV from home today with dizziness, nausea and R flank pain since yesterday. Pt started taking cbd oil two days ago and is wondering if their symptoms are related.

## 2022-01-04 ENCOUNTER — Telehealth: Payer: Self-pay

## 2022-01-04 NOTE — Telephone Encounter (Signed)
        Patient  visited [facility] on 12/20/2021  for Dizziness and giddiness and right sided flank pain.   Telephone encounter attempt :  1st  A HIPAA compliant voice message was left requesting a return call.  Instructed patient to call back at [phone].   [place] Resource Care Guide   [email or web address]  ?? [phone]   Website: [email or web address]  [email or web address]  "We don't say no, we SHOW how!"         [facility]

## 2022-01-07 ENCOUNTER — Telehealth: Payer: Self-pay

## 2022-01-07 NOTE — Telephone Encounter (Signed)
        Patient  visited [facility] on 12/20/2021  for Dizziness and giddiness and right sided flank pain.   Telephone encounter attempt :  2nd  A HIPAA compliant voice message was left requesting a return call.  Instructed patient to call back at [phone].   [place] Resource Care Guide   [email or web address]  ?? [phone]   Website: [email or web address]  [email or web address]

## 2022-01-08 ENCOUNTER — Telehealth: Payer: Self-pay

## 2022-01-08 NOTE — Telephone Encounter (Signed)
        Patient  visited [facility] on 12/20/2021  for Dizziness and giddiness and right sided flank pain.   Telephone encounter attempt :  3rd  A HIPAA compliant voice message was left requesting a return call.  Instructed patient to call back at [phone].   [place] Resource Care Guide   ??[email or web address]  ?? [phone]   Website: [email or web address]  [email or web address]

## 2022-01-15 DIAGNOSIS — N183 Chronic kidney disease, stage 3 unspecified: Secondary | ICD-10-CM | POA: Diagnosis not present

## 2022-01-15 DIAGNOSIS — J441 Chronic obstructive pulmonary disease with (acute) exacerbation: Secondary | ICD-10-CM | POA: Diagnosis not present

## 2022-01-21 DIAGNOSIS — M79641 Pain in right hand: Secondary | ICD-10-CM | POA: Diagnosis not present

## 2022-01-21 DIAGNOSIS — G479 Sleep disorder, unspecified: Secondary | ICD-10-CM | POA: Diagnosis not present

## 2022-01-21 DIAGNOSIS — R200 Anesthesia of skin: Secondary | ICD-10-CM | POA: Diagnosis not present

## 2022-01-21 DIAGNOSIS — R202 Paresthesia of skin: Secondary | ICD-10-CM | POA: Diagnosis not present

## 2022-01-21 DIAGNOSIS — G63 Polyneuropathy in diseases classified elsewhere: Secondary | ICD-10-CM | POA: Diagnosis not present

## 2022-01-21 DIAGNOSIS — M79642 Pain in left hand: Secondary | ICD-10-CM | POA: Diagnosis not present

## 2022-02-06 DIAGNOSIS — G894 Chronic pain syndrome: Secondary | ICD-10-CM | POA: Diagnosis not present

## 2022-02-06 DIAGNOSIS — J449 Chronic obstructive pulmonary disease, unspecified: Secondary | ICD-10-CM | POA: Diagnosis not present

## 2022-02-19 DIAGNOSIS — I10 Essential (primary) hypertension: Secondary | ICD-10-CM | POA: Diagnosis not present

## 2022-02-19 DIAGNOSIS — Z955 Presence of coronary angioplasty implant and graft: Secondary | ICD-10-CM | POA: Diagnosis not present

## 2022-02-19 DIAGNOSIS — Z23 Encounter for immunization: Secondary | ICD-10-CM | POA: Diagnosis not present

## 2022-02-19 DIAGNOSIS — I2510 Atherosclerotic heart disease of native coronary artery without angina pectoris: Secondary | ICD-10-CM | POA: Diagnosis not present

## 2022-02-19 DIAGNOSIS — I491 Atrial premature depolarization: Secondary | ICD-10-CM | POA: Diagnosis not present

## 2022-02-19 DIAGNOSIS — E1129 Type 2 diabetes mellitus with other diabetic kidney complication: Secondary | ICD-10-CM | POA: Diagnosis not present

## 2022-02-19 DIAGNOSIS — J449 Chronic obstructive pulmonary disease, unspecified: Secondary | ICD-10-CM | POA: Diagnosis not present

## 2022-02-19 DIAGNOSIS — E785 Hyperlipidemia, unspecified: Secondary | ICD-10-CM | POA: Diagnosis not present

## 2022-02-19 DIAGNOSIS — I2109 ST elevation (STEMI) myocardial infarction involving other coronary artery of anterior wall: Secondary | ICD-10-CM | POA: Diagnosis not present

## 2022-03-08 ENCOUNTER — Telehealth: Payer: Self-pay | Admitting: *Deleted

## 2022-03-08 NOTE — Patient Outreach (Signed)
  Care Coordination   Initial Visit Note   03/08/2022 Name: Henry [name] MRN: [id number] DOB: 07-10-1945  Henry [name] is a 76 y.o. year old male who sees Henry Pitch, MD for primary care. I spoke with  Henry [name] by phone today.  What matters to the patients health and wellness today?  Patient report he is doing well, denies the need for involvement at this time.  Agrees to receive program information and this care manager's contact information in the mail for future references.      SDOH assessments and interventions completed:  No     Care Coordination Interventions:  Yes, provided   Follow up plan: No further intervention required.   Encounter Outcome:  Pt. Visit Completed   Valente David, RN, MSN, [facility] Care Management Coordinator [phone]

## 2022-04-05 DIAGNOSIS — W010XXA Fall on same level from slipping, tripping and stumbling without subsequent striking against object, initial encounter: Secondary | ICD-10-CM | POA: Diagnosis not present

## 2022-04-05 DIAGNOSIS — J849 Interstitial pulmonary disease, unspecified: Secondary | ICD-10-CM | POA: Diagnosis not present

## 2022-04-05 DIAGNOSIS — S299XXA Unspecified injury of thorax, initial encounter: Secondary | ICD-10-CM | POA: Diagnosis not present

## 2022-04-17 DIAGNOSIS — M25569 Pain in unspecified knee: Secondary | ICD-10-CM | POA: Diagnosis not present

## 2022-04-17 DIAGNOSIS — R202 Paresthesia of skin: Secondary | ICD-10-CM | POA: Diagnosis not present

## 2022-04-17 DIAGNOSIS — M79642 Pain in left hand: Secondary | ICD-10-CM | POA: Diagnosis not present

## 2022-04-17 DIAGNOSIS — M79641 Pain in right hand: Secondary | ICD-10-CM | POA: Diagnosis not present

## 2022-04-17 DIAGNOSIS — R200 Anesthesia of skin: Secondary | ICD-10-CM | POA: Diagnosis not present

## 2022-04-17 DIAGNOSIS — M79671 Pain in right foot: Secondary | ICD-10-CM | POA: Diagnosis not present

## 2022-04-17 DIAGNOSIS — G479 Sleep disorder, unspecified: Secondary | ICD-10-CM | POA: Diagnosis not present

## 2022-04-17 DIAGNOSIS — E1129 Type 2 diabetes mellitus with other diabetic kidney complication: Secondary | ICD-10-CM | POA: Diagnosis not present

## 2022-04-17 DIAGNOSIS — G63 Polyneuropathy in diseases classified elsewhere: Secondary | ICD-10-CM | POA: Diagnosis not present

## 2022-04-23 ENCOUNTER — Ambulatory Visit
Payer: Medicare HMO | Attending: Student in an Organized Health Care Education/Training Program | Admitting: Student in an Organized Health Care Education/Training Program

## 2022-04-23 ENCOUNTER — Encounter: Payer: Self-pay | Admitting: Student in an Organized Health Care Education/Training Program

## 2022-04-23 VITALS — BP 103/57 | HR 80 | Temp 97.0°F | Resp 18 | Ht 71.0 in | Wt 225.0 lb

## 2022-04-23 DIAGNOSIS — M79642 Pain in left hand: Secondary | ICD-10-CM | POA: Diagnosis not present

## 2022-04-23 DIAGNOSIS — E1140 Type 2 diabetes mellitus with diabetic neuropathy, unspecified: Secondary | ICD-10-CM | POA: Insufficient documentation

## 2022-04-23 DIAGNOSIS — G894 Chronic pain syndrome: Secondary | ICD-10-CM | POA: Diagnosis not present

## 2022-04-23 DIAGNOSIS — M65342 Trigger finger, left ring finger: Secondary | ICD-10-CM | POA: Diagnosis not present

## 2022-04-23 NOTE — Progress Notes (Signed)
Safety precautions to be maintained throughout the outpatient stay will include: orient to surroundings, keep bed in low position, maintain call bell within reach at all times, provide assistance with transfer out of bed and ambulation.

## 2022-04-23 NOTE — Progress Notes (Signed)
Patient: Henry [name]  Service Category: E/M  Provider: Gillis Santa, MD  DOB: Jan 07, 1946  DOS: 04/23/2022  Referring Provider: Ottie Glazier, MD  MRN: [id number]  Setting: Ambulatory outpatient  PCP: Juluis Pitch, MD  Type: New Patient  Specialty: Interventional Pain Management    Location: Office  Delivery: Face-to-face     Primary Reason(s) for Visit: Encounter for initial evaluation of one or more chronic problems (new to examiner) potentially causing chronic pain, and posing a threat to normal musculoskeletal function. (Level of risk: High) CC: Hand Pain (Right ring finger) and Foot Pain (bialteral)  HPI  Henry [name] is a 77 y.o. year old, male patient, who comes for the first time to our practice referred by Ottie Glazier, MD for our initial evaluation of his chronic pain. He has CAP (community acquired pneumonia); Diabetes ([place]); HTN (hypertension); HLD (hyperlipidemia); ST elevation myocardial infarction involving left anterior descending (LAD) coronary artery ([place]); Acute ST elevation myocardial infarction (STEMI) involving left anterior descending (LAD) coronary artery (HCC); CAD in native artery; Leukocytosis; Insulin dependent diabetes mellitus; NSTEMI (non-ST elevated myocardial infarction) ([place]); S/P angioplasty with stent 08/10/17 emergently to LAD ,  08/13/17 atherectomy and stent to pRCA ; Hypoxia; COPD (chronic obstructive pulmonary disease) ([place]); Benign essential hypertension; Dyspnea on exertion; Acute respiratory failure with hypoxia ([place]); Chest pain; Unstable angina (HCC); COPD exacerbation ([place]); CKD (chronic kidney disease), stage IIIa; Severe sepsis ([place]); Chronic painful diabetic neuropathy ([place]); Acquired trigger finger of left ring finger; Left hand pain; and Chronic pain syndrome on their problem list. Today he comes in for evaluation of his Hand Pain (Right ring finger) and Foot Pain (bialteral)  Pain Assessment: Location: Right (ring finger) Hand Radiating:  denies Onset: More than a month ago Duration: Chronic pain Quality: Sharp Severity: 8 /10 (subjective, self-reported pain score)  Effect on ADL: Limits movement Timing: Constant Modifying factors: nothing BP: (!) 103/57  HR: 80  Onset and Duration: Gradual and Date of injury: 09-05-2017 Cause of pain: Unknown Severity: Getting worse Timing: Not influenced by the time of the day Aggravating Factors: Bending, Bowel movements, Climbing, Eating, Intercourse (sex), Kneeling, Lifiting, Motion, Nerve blocks, Prolonged sitting, Prolonged standing, Squatting, Stooping , Surgery made it worse, Twisting, Walking, Walking uphill, Walking downhill, and Working Alleviating Factors:  all Quality of Pain: Aching, Nagging, and Uncomfortable Previous Examinations or Tests: CT scan, MRI scan, X-rays, and Orthopedic evaluation Previous Treatments: Physical Therapy and Stretching exercises  Henry [name] is a pleasant 77 year old male who presents with 2 primary pain complaints including left ring trigger finger which is being managed by orthopedics.  He states that he has a trigger finger injection set up for next week.  He also complains of bilateral foot paresthesias secondary to chronic painful diabetic neuropathy.  He is currently on insulin.  He is also on hydrocodone managed by the [place].  He is on gabapentin and Cymbalta managed by neurology.  He has tried Lyrica in the past which resulted in side effects and was not effective.  Meds   Current Outpatient Medications:    albuterol (VENTOLIN HFA) 108 (90 Base) MCG/ACT inhaler, Inhale 2 puffs into the lungs every 6 (six) hours as needed for wheezing or shortness of breath., Disp: 8 g, Rfl: 2   aspirin EC 81 MG EC tablet, Take 1 tablet (81 mg total) by mouth daily., Disp: , Rfl:    atorvastatin (LIPITOR) 80 MG tablet, Take 1 tablet (80 mg total) by mouth daily at 6 PM. (Patient taking differently: Take 80  mg by mouth daily.), Disp: 30 tablet, Rfl: 2   cetirizine  (ZYRTEC) 10 MG tablet, Take 10 mg by mouth daily., Disp: , Rfl:    DULoxetine (CYMBALTA) 30 MG capsule, Take 30 mg by mouth daily., Disp: , Rfl:    empagliflozin (JARDIANCE) 25 MG TABS tablet, Take 25 mg by mouth daily., Disp: , Rfl:    Fluticasone-Salmeterol (ADVAIR) 250-50 MCG/DOSE AEPB, Inhale 1 puff into the lungs 2 (two) times daily., Disp: 60 each, Rfl: 1   furosemide (LASIX) 20 MG tablet, Take 1 tablet (20 mg total) by mouth daily., Disp: 30 tablet, Rfl: 1   gabapentin (NEURONTIN) 300 MG capsule, Take 300 mg by mouth at bedtime., Disp: , Rfl:    gabapentin (NEURONTIN) 600 MG tablet, Take 600 mg by mouth 3 (three) times daily., Disp: , Rfl:    HYDROcodone-acetaminophen (NORCO/VICODIN) 5-325 MG tablet, TAKE 1 TABLET BY MOUTH 3 TIMES A DAY AS NEEDED FOR PAIN. MAY MAKE YOU DROWSY.THESE TABLETS HAVE '325MG'$  ACETAMINOPHEN(GENERIC FOR TYLENOL);MAXIMUM SAFE DOSE '3000MG'$  ACETAMINOPHEN A DAY. DOSE ADJUSTMENT, Disp: , Rfl:    insulin detemir (LEVEMIR) 100 UNIT/ML injection, Inject 0.93 mLs (93 Units total) into the skin 2 (two) times daily. (Patient taking differently: Inject 92 Units into the skin 2 (two) times daily.), Disp: , Rfl:    ipratropium-albuterol (DUONEB) 0.5-2.5 (3) MG/3ML SOLN, Take 3 mLs by nebulization every 6 (six) hours as needed (wheezing)., Disp: 360 mL, Rfl: 1   irbesartan (AVAPRO) 150 MG tablet, Take 150 mg by mouth daily., Disp: , Rfl:    metoprolol tartrate (LOPRESSOR) 25 MG tablet, Take 1 tablet (25 mg total) by mouth 2 (two) times daily., Disp: 60 tablet, Rfl: 2   montelukast (SINGULAIR) 10 MG tablet, Take 10 mg by mouth at bedtime. , Disp: , Rfl:    Multiple Vitamin (MULTIVITAMIN WITH MINERALS) TABS tablet, Take 1 tablet by mouth daily., Disp: , Rfl:    nitroGLYCERIN (NITROSTAT) 0.4 MG SL tablet, PLACE 1 TABLET UNDER TONGUE EVERY 5 MIN AS NEEDED FOR CHEST PAIN IF NO RELIEF IN15 MIN CALL 911 (MAX 3 TABS) (Patient taking differently: Place 0.4 mg under the tongue every 5 (five)  minutes as needed for chest pain. IF NO RELIEF IN15 MIN CALL 911 (MAX 3 TABS)), Disp: 25 tablet, Rfl: 11   sitaGLIPtin-metformin (JANUMET) 50-1000 MG tablet, Take 1 tablet by mouth 2 (two) times daily with a meal., Disp: , Rfl:    tamsulosin (FLOMAX) 0.4 MG CAPS capsule, Take 0.4 mg by mouth daily., Disp: , Rfl:    diclofenac Sodium (VOLTAREN) 1 % GEL, APPLY 4 GRAMS TOPICALLY FOUR TIMES A DAY AS NEEDED FOR PAIN (Patient not taking: Reported on 04/23/2022), Disp: , Rfl:    fluticasone (FLONASE) 50 MCG/ACT nasal spray, Place 1 spray into both nostrils daily. (Patient not taking: Reported on 04/23/2022), Disp: , Rfl:    tiotropium (SPIRIVA HANDIHALER) 18 MCG inhalation capsule, Place 1 capsule (18 mcg total) into inhaler and inhale daily., Disp: 30 capsule, Rfl: 1  Imaging Review  Cervical Imaging: Cervical MR wo contrast: Results for orders placed during the hospital encounter of 02/27/20  MR CERVICAL SPINE WO CONTRAST  Narrative CLINICAL DATA:  Initial evaluation for acute left lateral neck pain with radiation into the left upper extremity.  EXAM: MRI CERVICAL SPINE WITHOUT CONTRAST  TECHNIQUE: Multiplanar, multisequence MR imaging of the cervical spine was performed. No intravenous contrast was administered.  COMPARISON:  Prior radiograph from 02/12/2020.  FINDINGS: Alignment: Straightening with mild reversal of the  normal cervical lordosis. Trace retrolisthesis of C5 on C6 and C6 on C7, with trace anterolisthesis of C7 on T1. Findings chronic and facet mediated.  Vertebrae: Vertebral body height maintained without acute or chronic fracture. Bone marrow signal intensity normal. No discrete or worrisome osseous lesions. Prominent reactive marrow edema seen about the left C2-3 facet due to facet arthritis (series 7, image 14). Finding suspected to be the source of patient's left-sided neck pain. More mild reactive marrow edema seen about the right C2-3 facet. Minor reactive endplate  change noted about the C5-6 and C6-7 interspaces. No other abnormal marrow edema.  Cord: Normal signal and morphology.  Posterior Fossa, vertebral arteries, paraspinal tissues: Visualized brain and posterior fossa within normal limits. Craniocervical junction normal. Mild soft tissue edema about the inflamed left C2-3 facet. Paraspinous soft tissues otherwise within normal limits. Normal flow voids seen within the vertebral arteries bilaterally.  Disc levels:  C2-C3: Tiny central disc protrusion minimally indents the ventral thecal sac. Mild left greater than right uncovertebral spurring. Severe left with mild right facet hypertrophy with prominent reactive marrow edema on the left. No spinal stenosis. Mild left C3 foraminal narrowing. Right neural foramen widely patent.  C3-C4: Diffuse disc bulge with bilateral uncovertebral hypertrophy. Broad posterior disc osteophyte flattens and partially faces the ventral thecal sac with resultant mild spinal stenosis. No cord impingement. Superimposed moderate bilateral facet hypertrophy. Severe bilateral C4 foraminal stenosis.  C4-C5: Disc bulge with bilateral uncovertebral hypertrophy. Severe left with mild right facet degeneration. Posterior disc osteophyte mildly flattens the ventral thecal sac with no more than mild spinal stenosis. No cord impingement. Moderate left with mild right C5 foraminal narrowing.  C5-C6: Degenerative intervertebral disc space narrowing with diffuse disc osteophyte complex. Broad posterior component flattens and mildly effaces the ventral thecal sac with resultant mild spinal stenosis. Minimal flattening of the ventral spinal cord. Superimposed mild bilateral facet hypertrophy. Severe right with mild-to-moderate left C6 foraminal narrowing.  C6-C7: Degenerative intervertebral disc space narrowing with diffuse disc osteophyte complex. Broad posterior component flattens and mildly effaces the ventral thecal  sac with resultant mild spinal stenosis. No significant cord deformity. Moderate left worse than right left C7 foraminal stenosis.  C7-T1: Trace anterolisthesis. No significant disc bulge. Moderate right with mild left facet hypertrophy. No canal or foraminal stenosis.  Visualized upper thoracic spine demonstrates no significant finding.  IMPRESSION: 1. Prominent reactive marrow edema about the left C2-3 facet due to facet arthritis. Finding suspected to be the source of patient's left-sided neck pain. 2. Multilevel cervical spondylosis with resultant mild diffuse spinal stenosis at C3-4 through C6-7. 3. Multifactorial degenerative changes with resultant multilevel foraminal narrowing as above. Notable findings include severe bilateral C4 foraminal stenosis, moderate left C5 foraminal narrowing, severe right C6 foraminal stenosis, with moderate left worse than right bilateral C7 foraminal narrowing.   Electronically Signed By: Jeannine Boga M.D. On: 02/28/2020 03:12   Narrative CLINICAL DATA:  Lateral knee pain and swelling.  EXAM: MRI OF THE LEFT KNEE WITHOUT CONTRAST  TECHNIQUE: Multiplanar, multisequence MR imaging of the knee was performed. No intravenous contrast was administered.  COMPARISON:  None.  FINDINGS: MENISCI  Medial meniscus:  Normal.  Lateral meniscus:  Normal.  LIGAMENTS  Cruciates:  Normal.  Collaterals:  Normal.  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  Medial:  Normal.  Lateral:  Normal.  Joint: No joint effusion. Edema deep to the distal iliotibial band  and lateral patellofemoral ligament. The possibility of iliotibial band syndrome should be considered.  Popliteal Fossa:  Tiny leaking Baker's cyst.  Extensor Mechanism: Focal degenerative changes of the proximal patellar tendon. Otherwise normal.  Bones:   Normal.  IMPRESSION: 1. 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 2. Edema deep to the distal iliotibial band. The possibility of iliotibial band syndrome should be considered.   Electronically Signed By: Lorriane Shire M.D. On: 05/29/2015 16:06   Complexity Note: Imaging results reviewed.                         ROS  Cardiovascular: Heart attack ( Date: 05-19) Pulmonary or Respiratory: No reported pulmonary signs or symptoms such as wheezing and difficulty taking a deep full breath (Asthma), difficulty blowing air out (Emphysema), coughing up mucus (Bronchitis), persistent dry cough, or temporary stoppage of breathing during sleep Neurological: No reported neurological signs or symptoms such as seizures, abnormal skin sensations, urinary and/or fecal incontinence, being born with an abnormal open spine and/or a tethered spinal cord Psychological-Psychiatric: No reported psychological or psychiatric signs or symptoms such as difficulty sleeping, anxiety, depression, delusions or hallucinations (schizophrenial), mood swings (bipolar disorders) or suicidal ideations or attempts Gastrointestinal: No reported gastrointestinal signs or symptoms such as vomiting or evacuating blood, reflux, heartburn, alternating episodes of diarrhea and constipation, inflamed or scarred liver, or pancreas or irrregular and/or infrequent bowel movements Genitourinary: Passing kidney stones Hematological: No reported hematological signs or symptoms such as prolonged bleeding, low or poor functioning platelets, bruising or bleeding easily, hereditary bleeding problems, low energy levels due to low hemoglobin or being anemic Endocrine: High blood sugar requiring insulin (IDDM) Rheumatologic: Rheumatoid arthritis Musculoskeletal: Negative for myasthenia gravis, muscular dystrophy, multiple sclerosis or malignant hyperthermia Work History: Retired  Allergies  Henry [name] is allergic to  lisinopril.  Laboratory Chemistry Profile   Renal Lab Results  Component Value Date   BUN 23 12/20/2021   CREATININE 1.27 (H) 12/20/2021   GFRAA 57 (L) 12/25/2019   GFRNONAA 59 (L) 12/20/2021   PROTEINUR 30 (A) 12/20/2021     Electrolytes Lab Results  Component Value Date   NA 141 12/20/2021   K 4.1 12/20/2021   CL 100 12/20/2021   CALCIUM 10.4 (H) 12/20/2021   MG 2.3 08/11/2017   PHOS 2.3 (L) 08/11/2017     Hepatic Lab Results  Component Value Date   AST 33 12/20/2021   ALT 30 12/20/2021   ALBUMIN 4.3 12/20/2021   ALKPHOS 65 12/20/2021   LIPASE 54 (H) 08/02/2020     ID Lab Results  Component Value Date   HIV Non Reactive 12/25/2019   SARSCOV2NAA POSITIVE (A) 09/02/2021   MRSAPCR NEGATIVE 08/10/2017     Bone No results found for: "VD25OH", "VD125OH2TOT", "TD1761YW7", "PX1062IR4", "25OHVITD1", "25OHVITD2", "85IOEVOJ5", "TESTOFREE", "TESTOSTERONE"   Endocrine Lab Results  Component Value Date   GLUCOSE 215 (H) 12/20/2021   GLUCOSEU >=500 (A) 12/20/2021   HGBA1C 7.9 (H) 02/11/2021     Neuropathy Lab Results  Component Value Date   HGBA1C 7.9 (H) 02/11/2021   HIV Non Reactive 12/25/2019     CNS No results found for: "COLORCSF", "APPEARCSF", "RBCCOUNTCSF", "WBCCSF", "POLYSCSF", "LYMPHSCSF", "EOSCSF", "PROTEINCSF", "GLUCCSF", "JCVIRUS", "CSFOLI", "IGGCSF", "LABACHR", "ACETBL"   Inflammation (CRP: Acute  ESR: Chronic) Lab Results  Component Value Date   LATICACIDVEN 2.9 ([place]) 02/11/2021     Rheumatology No results found for: "RF", "ANA", "LABURIC", "URICUR", "LYMEIGGIGMAB", "LYMEABIGMQN", "HLAB27"   Coagulation  Lab Results  Component Value Date   INR 0.9 08/02/2020   LABPROT 12.3 08/02/2020   APTT 32 08/02/2020   PLT 275 12/20/2021     Cardiovascular Lab Results  Component Value Date   BNP 13.3 02/11/2021   TROPONINI 2.24 (HH) 08/10/2017   HGB 15.8 12/20/2021   HCT 50.4 12/20/2021     Screening Lab Results  Component Value Date    SARSCOV2NAA POSITIVE (A) 09/02/2021   MRSAPCR NEGATIVE 08/10/2017   HIV Non Reactive 12/25/2019     Cancer No results found for: "CEA", "CA125", "LABCA2"   Allergens No results found for: "ALMOND", "APPLE", "ASPARAGUS", "AVOCADO", "BANANA", "BARLEY", "BASIL", "BAYLEAF", "GREENBEAN", "LIMABEAN", "WHITEBEAN", "BEEFIGE", "REDBEET", "BLUEBERRY", "BROCCOLI", "CABBAGE", "MELON", "CARROT", "CASEIN", "CASHEWNUT", "CAULIFLOWER", "CELERY"     Note: Lab results reviewed.  PFSH  Drug: Henry [name]  reports no history of drug use. Alcohol:  reports no history of alcohol use. Tobacco:  reports that he quit smoking about 4 years ago. His smoking use included cigarettes. He has a 50.00 pack-year smoking history. He has never used smokeless tobacco. Medical:  has a past medical history of Anginal pain ([place]), Arthritis, CAD (coronary artery disease), Cancer ([place]), CKD (chronic kidney disease), stage II, COPD (chronic obstructive pulmonary disease) ([place]), Glaucoma, History of kidney stones, Hyperlipidemia, Hypertension, Insulin dependent diabetes mellitus, Ischemic cardiomyopathy, Myocardial infarction ([place]), Neuropathy, and S/P angioplasty with stent 08/10/17 emergently to LAD ,  08/13/17 atherectomy and stent to pRCA  (08/14/2017). Family: family history includes Heart attack in his mother; Melanoma in his father; Parkinsonism in his father.  Past Surgical History:  Procedure Laterality Date   CARDIAC CATHETERIZATION     COLONOSCOPY WITH PROPOFOL N/A 12/08/2014   Procedure: COLONOSCOPY WITH PROPOFOL;  Surgeon: Manya Silvas, MD;  Location: [facility] ENDOSCOPY;  Service: Endoscopy;  Laterality: N/A;   COLONOSCOPY WITH PROPOFOL N/A 01/10/2016   Procedure: COLONOSCOPY WITH PROPOFOL;  Surgeon: Manya Silvas, MD;  Location: [facility] ENDOSCOPY;  Service: Endoscopy;  Laterality: N/A;   COLONOSCOPY WITH PROPOFOL N/A 04/28/2019   Procedure: COLONOSCOPY WITH PROPOFOL;  Surgeon: Robert Bellow, MD;  Location: [facility] ENDOSCOPY;   Service: Endoscopy;  Laterality: N/A;   COLONOSCOPY WITH PROPOFOL N/A 03/17/2020   Procedure: COLONOSCOPY WITH PROPOFOL;  Surgeon: Robert Bellow, MD;  Location: [facility] ENDOSCOPY;  Service: Endoscopy;  Laterality: N/A;   CORONARY ATHERECTOMY N/A 08/13/2017   Procedure: CORONARY ATHERECTOMY;  Surgeon: Wellington Hampshire, MD;  Location: [place] CV LAB;  Service: Cardiovascular;  Laterality: N/A;   CORONARY STENT INTERVENTION N/A 08/13/2017   Procedure: CORONARY STENT INTERVENTION;  Surgeon: Wellington Hampshire, MD;  Location: [place] CV LAB;  Service: Cardiovascular;  Laterality: N/A;   CORONARY/GRAFT ACUTE MI REVASCULARIZATION N/A 08/10/2017   Procedure: Coronary/Graft Acute MI Revascularization;  Surgeon: Burnell Blanks, MD;  Location: [place] CV LAB;  Service: Cardiovascular;  Laterality: N/A;   EXTRACORPOREAL SHOCK WAVE LITHOTRIPSY Right 11/09/2015   Procedure: EXTRACORPOREAL SHOCK WAVE LITHOTRIPSY (ESWL);  Surgeon: Royston Cowper, MD;  Location: [facility] ORS;  Service: Urology;  Laterality: Right;   EXTRACORPOREAL SHOCK WAVE LITHOTRIPSY Left 10/17/2016   Procedure: EXTRACORPOREAL SHOCK WAVE LITHOTRIPSY (ESWL);  Surgeon: Royston Cowper, MD;  Location: [facility] ORS;  Service: Urology;  Laterality: Left;   LEFT HEART CATH AND CORONARY ANGIOGRAPHY N/A 08/10/2017   Procedure: LEFT HEART CATH AND CORONARY ANGIOGRAPHY;  Surgeon: Burnell Blanks, MD;  Location: [place] CV LAB;  Service: Cardiovascular;  Laterality: N/A;   LITHOTRIPSY  x 6   PAROTIDECTOMY  2015   TEMPORARY PACEMAKER N/A 08/13/2017   Procedure: TEMPORARY PACEMAKER;  Surgeon: Wellington Hampshire, MD;  Location: [place] CV LAB;  Service: Cardiovascular;  Laterality: N/A;   TONSILLECTOMY     Active Ambulatory Problems    Diagnosis Date Noted   CAP (community acquired pneumonia) 01/25/2016   Diabetes ([place]) 01/25/2016   HTN (hypertension) 01/25/2016   HLD (hyperlipidemia) 01/25/2016   ST elevation myocardial  infarction involving left anterior descending (LAD) coronary artery (HCC)    Acute ST elevation myocardial infarction (STEMI) involving left anterior descending (LAD) coronary artery ([place]) 08/10/2017   CAD in native artery 08/12/2017   Leukocytosis 08/12/2017   Insulin dependent diabetes mellitus 08/12/2017   NSTEMI (non-ST elevated myocardial infarction) ([place]) 08/12/2017   S/P angioplasty with stent 08/10/17 emergently to LAD ,  08/13/17 atherectomy and stent to pRCA  08/14/2017   Hypoxia 12/24/2019   COPD (chronic obstructive pulmonary disease) ([place]) 06/02/2017   Benign essential hypertension 07/12/2013   Dyspnea on exertion 12/25/2019   Acute respiratory failure with hypoxia ([place]) 12/25/2019   Chest pain 08/02/2020   Unstable angina ([place]) 08/02/2020   COPD exacerbation ([place]) 02/11/2021   CKD (chronic kidney disease), stage IIIa 02/11/2021   Severe sepsis ([place]) 02/11/2021   Chronic painful diabetic neuropathy ([place]) 04/23/2022   Acquired trigger finger of left ring finger 04/23/2022   Left hand pain 04/23/2022   Chronic pain syndrome 04/23/2022   Resolved Ambulatory Problems    Diagnosis Date Noted   No Resolved Ambulatory Problems   Past Medical History:  Diagnosis Date   Anginal pain ([place])    Arthritis    CAD (coronary artery disease)    Cancer ([place])    CKD (chronic kidney disease), stage II    Glaucoma    History of kidney stones    Hyperlipidemia    Hypertension    Ischemic cardiomyopathy    Myocardial infarction ([place])    Neuropathy    Constitutional Exam  General appearance: Well nourished, well developed, and well hydrated. In no apparent acute distress Vitals:   04/23/22 1000  BP: (!) 103/57  Pulse: 80  Resp: 18  Temp: (!) 97 F (36.1 C)  SpO2: 99%  Weight: 225 lb (102.1 kg)  Height: '5\' 11"'$  (1.803 m)   BMI Assessment: Estimated body mass index is 31.38 kg/m as calculated from the following:   Height as of this encounter: '5\' 11"'$  (1.803 m).   Weight as of  this encounter: 225 lb (102.1 kg).  BMI interpretation table: BMI level Category Range association with higher incidence of chronic pain  <18 kg/m2 Underweight   18.5-24.9 kg/m2 Ideal body weight   25-29.9 kg/m2 Overweight Increased incidence by 20%  30-34.9 kg/m2 Obese (Class I) Increased incidence by 68%  35-39.9 kg/m2 Severe obesity (Class II) Increased incidence by 136%  >40 kg/m2 Extreme obesity (Class III) Increased incidence by 254%   Patient's current BMI Ideal Body weight  Body mass index is 31.38 kg/m. Ideal body weight: 75.3 kg (166 lb 0.1 oz) Adjusted ideal body weight: 86 kg (189 lb 9.7 oz)   BMI Readings from Last 4 Encounters:  04/23/22 31.38 kg/m  12/20/21 30.52 kg/m  09/02/21 30.52 kg/m  02/14/21 31.57 kg/m   Wt Readings from Last 4 Encounters:  04/23/22 225 lb (102.1 kg)  12/20/21 225 lb (102.1 kg)  09/02/21 225 lb (102.1 kg)  02/14/21 232 lb 12.9 oz (105.6 kg)    Psych/Mental status: Alert,  oriented x 3 (person, place, & time)       Eyes: PERLA Respiratory: No evidence of acute respiratory distress  Left ring trigger finger and left ring metacarpal tenderness and pain.  Pain with left ring finger flexion  Paresthesias of bilateral feet  Assessment  Primary Diagnosis & Pertinent Problem List: The primary encounter diagnosis was Chronic painful diabetic neuropathy ([place]). Diagnoses of Acquired trigger finger of left ring finger, Left hand pain, and Chronic pain syndrome were also pertinent to this visit.  Visit Diagnosis (New problems to examiner): 1. Chronic painful diabetic neuropathy ([place])   2. Acquired trigger finger of left ring finger   3. Left hand pain   4. Chronic pain syndrome    Plan of Care (Initial workup plan)  Discussed Qutenza treatment for bilateral foot paresthesias secondary to painful diabetic neuropathy.  Risk and benefits reviewed and patient would like to proceed. Continue care with orthopedics and neurology as well as the  [place].  Procedure Orders         NEUROLYSIS         Provider-requested follow-up: Return in about 2 weeks (around 05/07/2022) for Qutenza b/l feet.  No future appointments.  Duration of encounter: 69mnutes.  Total time on encounter, as per AMA guidelines included both the face-to-face and non-face-to-face time personally spent by the physician and/or other qualified health care professional(s) on the day of the encounter (includes time in activities that require the physician or other qualified health care professional and does not include time in activities normally performed by clinical staff). Physician's time may include the following activities when performed: Preparing to see the patient (e.g., pre-charting review of records, searching for previously ordered imaging, lab work, and nerve conduction tests) Review of prior analgesic pharmacotherapies. Reviewing PMP Interpreting ordered tests (e.g., lab work, imaging, nerve conduction tests) Performing post-procedure evaluations, including interpretation of diagnostic procedures Obtaining and/or reviewing separately obtained history Performing a medically appropriate examination and/or evaluation Counseling and educating the patient/family/caregiver Ordering medications, tests, or procedures Referring and communicating with other health care professionals (when not separately reported) Documenting clinical information in the electronic or other health record Independently interpreting results (not separately reported) and communicating results to the patient/ family/caregiver Care coordination (not separately reported)  Note by: BGillis Santa MD Date: 04/23/2022; Time: 2:03 PM

## 2022-04-25 ENCOUNTER — Other Ambulatory Visit: Payer: Self-pay | Admitting: Cardiology

## 2022-05-07 DIAGNOSIS — M65342 Trigger finger, left ring finger: Secondary | ICD-10-CM | POA: Diagnosis not present

## 2022-05-13 ENCOUNTER — Ambulatory Visit
Payer: Medicare HMO | Attending: Student in an Organized Health Care Education/Training Program | Admitting: Student in an Organized Health Care Education/Training Program

## 2022-05-13 ENCOUNTER — Encounter: Payer: Self-pay | Admitting: Student in an Organized Health Care Education/Training Program

## 2022-05-13 DIAGNOSIS — E1140 Type 2 diabetes mellitus with diabetic neuropathy, unspecified: Secondary | ICD-10-CM | POA: Insufficient documentation

## 2022-05-13 MED ORDER — CAPSAICIN-CLEANSING GEL 8 % EX KIT
4.0000 | PACK | Freq: Once | CUTANEOUS | Status: AC
  Administered 2022-05-13: 1 via TOPICAL

## 2022-05-13 NOTE — Progress Notes (Signed)
PROVIDER NOTE: Interpretation of information contained herein should be left to medically-trained personnel. Specific patient instructions are provided elsewhere under "Patient Instructions" section of medical record. This document was created in part using STT-dictation technology, any transcriptional errors that may result from this process are unintentional.  Patient: Henry [name] Type: Established DOB: 06-03-1945 MRN: [id number] PCP: Juluis Pitch, MD  Service: Procedure DOS: 05/13/2022 Setting: Ambulatory Location: Ambulatory outpatient facility Delivery: Face-to-face Provider: Gillis Santa, MD Specialty: Interventional Pain Management Specialty designation: 09 Location: Outpatient facility Ref. Prov.: Gillis Santa, MD       Interventional Therapy     Interventional Treatment:          Procedure: Qutenza Neurolysis #1  Laterality:  Bilateral Area treated: Feet Imaging Guidance: None Anesthesia/analgesia/anxiolysis/sedation: None required Medication (Right): Qutenza patches Medication (Left): Qutenza patches Date: 05/13/2022 Performed by: Gillis Santa, MD 1. Chronic painful diabetic neuropathy (HCC)    NAS-11 Pain score:   Pre-procedure: 9 /10   Post-procedure: 9 /10     Position / Prep / Materials:  Position: Supine  Materials: Qutenza Kit  Pre-op H&P Assessment:  Henry [name] is a 77 y.o. (year old), male patient, seen today for interventional treatment. 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 Henry [name] has a current medication list which includes the following prescription(s): albuterol, aspirin ec, atorvastatin, cetirizine, diclofenac sodium, duloxetine, empagliflozin, fluticasone, fluticasone-salmeterol, furosemide, gabapentin, gabapentin, hydrocodone-acetaminophen, insulin detemir, ipratropium-albuterol, irbesartan, metoprolol tartrate, montelukast, multivitamin with minerals, nitroglycerin, sitagliptin-metformin, tamsulosin, and spiriva handihaler. His primarily concern today is the Foot Pain (bialteral)  Initial Vital Signs:  Pulse/HCG Rate: 83  Temp: (!) 97.2 F (36.2 C) Resp:   BP: 136/87 SpO2: 93 %  BMI: Estimated body mass index is 30.52 kg/m as calculated from the following:   Height as of this encounter: 6' (1.829 m).   Weight as of this encounter: 225 lb (102.1 kg).  Risk Assessment: Allergies: Reviewed. He is allergic to lisinopril.  Allergy Precautions: None required Coagulopathies: Reviewed. None identified.  Blood-thinner therapy: None at this time Active Infection(s): Reviewed. None identified. Henry [name] is afebrile  Site Confirmation: Henry [name] was asked to confirm the procedure and laterality before marking the site Procedure checklist: Completed Consent: Before the procedure and under the influence of no sedative(s), amnesic(s), or anxiolytics, the patient was informed of the treatment options, risks and possible complications. To fulfill our ethical and legal obligations, as recommended by the American Medical Association's Code of Ethics, I have informed the patient of my clinical impression; the nature and purpose of the treatment or procedure; the risks, benefits, and possible complications of the intervention; the alternatives, including doing nothing; the risk(s) and benefit(s) of the alternative treatment(s)  or procedure(s); and the risk(s) and benefit(s) of doing nothing. The patient was provided information about the general risks and possible complications associated with the procedure. These may include, but are not limited to: failure to achieve desired goals, infection, bleeding, organ or nerve damage, allergic reactions, paralysis, and death. In addition, the patient was informed of those risks and complications associated to the procedure, such as failure to  decrease pain; infection; bleeding; organ or nerve damage with subsequent damage to sensory, motor, and/or autonomic systems, resulting in permanent pain, numbness, and/or weakness of one or several areas of the body; allergic reactions; (i.e.: anaphylactic reaction); and/or death. Furthermore, the patient was informed of those risks and complications associated with the medications. These include, but are not limited to: allergic reactions (i.e.: anaphylactic or anaphylactoid reaction(s)); adrenal axis suppression; blood sugar elevation that in diabetics may result in ketoacidosis or comma; water retention that in patients with history of congestive heart failure may result in shortness of breath, pulmonary edema, and decompensation with resultant heart failure; weight gain; swelling or edema; medication-induced neural toxicity; particulate matter embolism and blood vessel occlusion with resultant organ, and/or nervous system infarction; and/or aseptic necrosis of one or more joints. Finally, the patient was informed that Medicine is not an exact science; therefore, there is also the possibility of unforeseen or unpredictable risks and/or possible complications that may result in a catastrophic outcome. The patient indicated having understood very clearly. We have given the patient no guarantees and we have made no promises. Enough time was given to the patient to ask questions, all of which were answered to the patient's satisfaction. Henry [name] has  indicated that he wanted to continue with the procedure. Attestation: I, the ordering provider, attest that I have discussed with the patient the benefits, risks, side-effects, alternatives, likelihood of achieving goals, and potential problems during recovery for the procedure that I have provided informed consent. Date  Time: 05/13/2022 10:19 AM  Pre-Procedure Preparation:  Monitoring: As per clinic protocol. Respiration, ETCO2, SpO2, BP, heart rate and rhythm monitor placed and checked for adequate function Safety Precautions: Patient was assessed for positional comfort and pressure points before starting the procedure. Time-out: I initiated and conducted the "Time-out" before starting the procedure, as per protocol. The patient was asked to participate by confirming the accuracy of the "Time Out" information. Verification of the correct person, site, and procedure were performed and confirmed by me, the nursing staff, and the patient. "Time-out" conducted as per Joint Commission's Universal Protocol (UP.01.01.01). Time: 1035  Description/Narrative of Procedure:          Region: Distal lower extremity Target Area: Sensory peripheral nerves affected by diabetic peripheral neuropathy Site: Feet Approach: Percutaneous  No./Series: Not applicable  Type: Percutaneous  Purpose: Therapeutic  Region: Distal lower extremities  Description of the Procedure: Protocol guidelines were followed. The patient was assisted into a comfortable position.  Informed consent was obtained in the patient monitored in the usual manner.  All questions were answered prior to the procedure.  They Qutenza patches were applied to the affected area and then covered with the wrap.  The Patient was kept under observation until the treatment was completed.  The patches were removed and the treated area was inspected.  Vitals:   05/13/22 1022  BP: 136/87  Pulse: 83  Temp: (!) 97.2 F (36.2 C)  TempSrc: Temporal  SpO2:  93%  Weight: 225 lb (102.1 kg)  Height: 6' (1.829 m)     Start Time: 1036 hrs. End Time: 1116 hrs.   Post-operative Assessment:  Post-procedure Vital Signs:  Pulse/HCG Rate: 83  Temp: (!) 97.2 F (36.2 C) Resp:   BP: 136/87 SpO2: 93 %  EBL: None  Complications: No immediate post-treatment complications observed by team, or reported by patient.  Note: The patient tolerated the entire procedure well. A repeat set of vitals were taken after the procedure and the  patient was kept under observation following institutional policy, for this type of procedure. Post-procedural neurological assessment was performed, showing return to baseline, prior to discharge. The patient was provided with post-procedure discharge instructions, including a section on how to identify potential problems. Should any problems arise concerning this procedure, the patient was given instructions to immediately contact us, at any time, without hesitation. In any case, we plan to contact the patient by telephone for a follow-up status report regarding this interventional procedure.  Comments:  No additional relevant information.  Plan of Care (POC)  Orders:  No orders of the defined types were placed in this encounter.  Medications ordered for procedure: Meds ordered this encounter  Medications   capsaicin topical system 8 % patch 4 patch   Medications administered: We administered capsaicin topical system.  See the medical record for exact dosing, route, and time of administration.  Follow-up plan:   Return in about 4 weeks (around 06/10/2022) for Post Procedure Evaluation, in person.      Recent Visits Date Type Provider Dept  04/23/22 Office Visit Gillis Santa, MD [facility]  Showing recent visits within past 90 days and meeting all other requirements Today's Visits Date Type Provider Dept  05/13/22 Procedure visit Gillis Santa, MD [facility]  Showing today's visits and  meeting all other requirements Future Appointments Date Type Provider Dept  06/12/22 Appointment Gillis Santa, MD [facility]  Showing future appointments within next 90 days and meeting all other requirements  Disposition: Discharge home  Discharge (Date  Time): 05/13/2022; 1130 hrs.   Primary Care Physician: Juluis Pitch, MD Location: [facility] Note by: Gillis Santa, MD Date: 05/13/2022; Time: 11:32 AM  Disclaimer:  Medicine is not an exact science. The only guarantee in medicine is that nothing is guaranteed. It is important to note that the decision to proceed with this intervention was based on the information collected from the patient. The Data and conclusions were drawn from the patient's questionnaire, the interview, and the physical examination. Because the information was provided in large part by the patient, it cannot be guaranteed that it has not been purposely or unconsciously manipulated. Every effort has been made to obtain as much relevant data as possible for this evaluation. It is important to note that the conclusions that lead to this procedure are derived in large part from the available data. Always take into account that the treatment will also be dependent on availability of resources and existing treatment guidelines, considered by other Pain Management Practitioners as being common knowledge and practice, at the time of the intervention. For Medico-Legal purposes, it is also important to point out that variation in procedural techniques and pharmacological choices are the acceptable norm. The indications, contraindications, technique, and results of the above procedure should only be interpreted and judged by a Board-Certified Interventional Pain Specialist with extensive familiarity and expertise in the same exact procedure and technique.

## 2022-05-13 NOTE — Patient Instructions (Signed)
Apply cooling cream as needed for burning in feet.

## 2022-05-13 NOTE — Progress Notes (Signed)
Safety precautions to be maintained throughout the outpatient stay will include: orient to surroundings, keep bed in low position, maintain call bell within reach at all times, provide assistance with transfer out of bed and ambulation.  

## 2022-05-14 ENCOUNTER — Telehealth: Payer: Self-pay | Admitting: Student in an Organized Health Care Education/Training Program

## 2022-05-14 ENCOUNTER — Telehealth: Payer: Self-pay

## 2022-05-14 NOTE — Telephone Encounter (Signed)
Post procedure follow up.  LM 

## 2022-05-14 NOTE — Telephone Encounter (Signed)
PT called back stated that he is sorry he missed the called. However patient stated that he is doing fine. If you need to give him a call please do. Thanks

## 2022-06-11 ENCOUNTER — Ambulatory Visit: Payer: Medicare HMO | Admitting: Urology

## 2022-06-11 ENCOUNTER — Encounter: Payer: Self-pay | Admitting: Urology

## 2022-06-11 VITALS — BP 104/63 | HR 73 | Ht 71.0 in | Wt 225.0 lb

## 2022-06-11 DIAGNOSIS — N401 Enlarged prostate with lower urinary tract symptoms: Secondary | ICD-10-CM | POA: Diagnosis not present

## 2022-06-11 DIAGNOSIS — R3129 Other microscopic hematuria: Secondary | ICD-10-CM | POA: Diagnosis not present

## 2022-06-11 DIAGNOSIS — N200 Calculus of kidney: Secondary | ICD-10-CM

## 2022-06-11 DIAGNOSIS — N400 Enlarged prostate without lower urinary tract symptoms: Secondary | ICD-10-CM | POA: Diagnosis not present

## 2022-06-11 LAB — URINALYSIS, COMPLETE
Bilirubin, UA: NEGATIVE
Nitrite, UA: NEGATIVE
Specific Gravity, UA: 1.015 (ref 1.005–1.030)
Urobilinogen, Ur: 0.2 mg/dL (ref 0.2–1.0)
pH, UA: 5.5 (ref 5.0–7.5)

## 2022-06-11 LAB — MICROSCOPIC EXAMINATION

## 2022-06-11 LAB — BLADDER SCAN AMB NON-IMAGING

## 2022-06-11 NOTE — Progress Notes (Signed)
Haze Rushing Plume,acting as a scribe for Hollice Espy, MD.,have documented all relevant documentation on the behalf of Hollice Espy, MD,as directed by  Hollice Espy, MD while in the presence of Hollice Espy, MD.  06/11/2022 1:36 PM   Henry [name] 03-19-1946 [id number]  Referring provider: Juluis Pitch, MD [phone] [place],  [place] [address]  Chief Complaint  Patient presents with   Establish Care   Benign Prostatic Hypertrophy    HPI: 77 year-old male who was referred to establish care. He was previously seen by Dr. Yves Dill.   He has a personal history of recurrent nephrolithiasis. He is status post ESWL in 2018 for a left renal calculus times 3. His most recent cross-sectional imagine was in the form of a CT stone protocol in 12/2021. He has bilateral non-obstructing stones up to 1.3 cm on the right and 0.9 cm on the left. He had no obstructing stones. He notes that over the past 3-4 years, he has passed small flecks without incident. It has been 5 years since his last stone event.   He has a personal history of BPH is managed on Flomax. It does not appear that he has ever had procedural intervention. His most recent PSA in 06/2020 was 2.5.   He reports that he has neuropathy and is seen by Dr. Holley Raring for this.   He was a smoker since he was 77 years-old but he quit in 2019 after a heart attack. He has been diagnosed with COPD.   Results for orders placed or performed in visit on 06/11/22  Microscopic Examination   Urine  Result Value Ref Range   WBC, UA 11-30 (A) 0 - 5 /hpf   RBC, Urine 3-10 (A) 0 - 2 /hpf   Epithelial Cells (non renal) 0-10 0 - 10 /hpf   Mucus, UA Present (A) Not Estab.   Bacteria, UA Few None seen/Few  Urinalysis, Complete  Result Value Ref Range   Specific Gravity, UA 1.015 1.005 - 1.030   pH, UA 5.5 5.0 - 7.5   Color, UA Yellow Yellow   Appearance Ur Clear Clear   Leukocytes,UA Trace (A) Negative   Protein,UA 1+ (A)  Negative/Trace   Glucose, UA 3+ (A) Negative   Ketones, UA Trace (A) Negative   RBC, UA 1+ (A) Negative   Bilirubin, UA Negative Negative   Urobilinogen, Ur 0.2 0.2 - 1.0 mg/dL   Nitrite, UA Negative Negative   Microscopic Examination See below:   Bladder Scan (Post Void Residual) in office  Result Value Ref Range   Scan Result 17m     IPSS     Row Name 06/11/22 1000         International Prostate Symptom Score   How often have you had the sensation of not emptying your bladder? Not at All     How often have you had to urinate less than every two hours? Less than half the time     How often have you found you stopped and started again several times when you urinated? Not at All     How often have you found it difficult to postpone urination? Less than half the time     How often have you had a weak urinary stream? Not at All     How often have you had to strain to start urination? Not at All     How many times did you typically get up at night to urinate? 1  Time     Total IPSS Score 5       Quality of Life due to urinary symptoms   If you were to spend the rest of your life with your urinary condition just the way it is now how would you feel about that? Pleased              Score:  1-7 Mild 8-19 Moderate 20-35 Severe  PMH: Past Medical History:  Diagnosis Date   Anginal pain ([facility])    Arthritis    CAD (coronary artery disease)    a. anterior stemi 08/10/2017; b. LHC 08/10/17: pLAD 70, p-mLAD 99 thrombotic, ostD1 70, p-mLCx 30, pRCA-1 50 calcified, pRCA-2 99 calcified, dRCA 30. Successful PCI/DES to p-mLAD x 2   Cancer (HCC)    skin   CKD (chronic kidney disease), stage II    COPD (chronic obstructive pulmonary disease) (HCC)    Glaucoma    History of kidney stones    Hyperlipidemia    Hypertension    Insulin dependent diabetes mellitus    Ischemic cardiomyopathy    a. TTE 08/10/17: technically difficult study, no diagnostic RWMA, EF 40-50%, poor windows, very  difficult study    Myocardial infarction [facility])    Neuropathy    S/P angioplasty with stent 08/10/17 emergently to LAD ,  08/13/17 atherectomy and stent to pRCA  08/14/2017    Surgical History: Past Surgical History:  Procedure Laterality Date   CARDIAC CATHETERIZATION     COLONOSCOPY WITH PROPOFOL N/A 12/08/2014   Procedure: COLONOSCOPY WITH PROPOFOL;  Surgeon: Manya Silvas, MD;  Location: [facility];  Service: Endoscopy;  Laterality: N/A;   COLONOSCOPY WITH PROPOFOL N/A 01/10/2016   Procedure: COLONOSCOPY WITH PROPOFOL;  Surgeon: Manya Silvas, MD;  Location: [facility] ENDOSCOPY;  Service: Endoscopy;  Laterality: N/A;   COLONOSCOPY WITH PROPOFOL N/A 04/28/2019   Procedure: COLONOSCOPY WITH PROPOFOL;  Surgeon: Robert Bellow, MD;  Location: [facility] ENDOSCOPY;  Service: Endoscopy;  Laterality: N/A;   COLONOSCOPY WITH PROPOFOL N/A 03/17/2020   Procedure: COLONOSCOPY WITH PROPOFOL;  Surgeon: Robert Bellow, MD;  Location: [facility] ENDOSCOPY;  Service: Endoscopy;  Laterality: N/A;   CORONARY ATHERECTOMY N/A 08/13/2017   Procedure: CORONARY ATHERECTOMY;  Surgeon: Wellington Hampshire, MD;  Location: [facility] CV [facility];  Service: Cardiovascular;  Laterality: N/A;   CORONARY STENT INTERVENTION N/A 08/13/2017   Procedure: CORONARY STENT INTERVENTION;  Surgeon: Wellington Hampshire, MD;  Location: [facility];  Service: Cardiovascular;  Laterality: N/A;   CORONARY/GRAFT ACUTE MI REVASCULARIZATION N/A 08/10/2017   Procedure: Coronary/Graft Acute MI Revascularization;  Surgeon: Burnell Blanks, MD;  Location: [place] [facility];  Service: Cardiovascular;  Laterality: N/A;   EXTRACORPOREAL SHOCK WAVE LITHOTRIPSY Right 11/09/2015   Procedure: EXTRACORPOREAL SHOCK WAVE LITHOTRIPSY (ESWL);  Surgeon: Royston Cowper, MD;  Location: [facility] OR[facility];  Service: Urology;  Laterality: Right;   EXTRACORPOREAL SHOCK WAVE LITHOTRIPSY Left 10/17/2016   Procedure: EXTRACORPOREAL SHOCK WAVE LITHOTRIPSY (ESWL);  Surgeon:  Royston Cowper, MD;  Location: [facility] OR[facility];  Service: Urology;  Laterality: Left;   LEFT HEART CATH AND CORONARY ANGIOGRAPHY N/A 08/10/2017   Procedure: LEFT HEART CATH AND CORONARY ANGIOGRAPHY;  Surgeon: Burnell Blanks, MD;  Location: [place];  Service: Cardiovascular;  Laterality: N/A;   LITHOTRIPSY     x 6   PAROTIDECTOMY  2015   TEMPORARY PACEMAKER N/A 08/13/2017   Procedure: TEMPORARY PACEMAKER;  Surgeon: Wellington Hampshire, MD;  Location: [place]  [facility];  Service: Cardiovascular;  Laterality: N/A;   TONSILLECTOMY      Home Medications:  Allergies as of 06/11/2022       Reactions   Lisinopril Cough        Medication List        Accurate as of June 11, 2022  1:36 PM. If you have any questions, ask your nurse or doctor.          STOP taking these medications    Spiriva HandiHaler 18 MCG inhalation capsule Generic drug: tiotropium Stopped by: Hollice Espy, MD       TAKE these medications    albuterol 108 (90 Base) MCG/ACT inhaler Commonly known as: VENTOLIN HFA Inhale 2 puffs into the lungs every 6 (six) hours as needed for wheezing or shortness of breath.   aspirin EC 81 MG tablet Take 1 tablet (81 mg total) by mouth daily.   atorvastatin 80 MG tablet Commonly known as: LIPITOR Take 1 tablet (80 mg total) by mouth daily at 6 PM. What changed: when to take this   cetirizine 10 MG tablet Commonly known as: ZYRTEC Take 10 mg by mouth daily.   diclofenac Sodium 1 % Gel Commonly known as: VOLTAREN   DULoxetine 30 MG capsule Commonly known as: CYMBALTA Take 30 mg by mouth daily.   empagliflozin 25 MG Tabs tablet Commonly known as: JARDIANCE Take 25 mg by mouth daily.   fluticasone 50 MCG/ACT nasal spray Commonly known as: FLONASE Place 1 spray into both nostrils daily.   Fluticasone-Salmeterol 250-50 MCG/DOSE Aepb Commonly known as: ADVAIR Inhale 1 puff into the lungs 2 (two) times daily.   furosemide 20 MG tablet Commonly  known as: LASIX Take 1 tablet (20 mg total) by mouth daily.   gabapentin 600 MG tablet Commonly known as: NEURONTIN Take 600 mg by mouth 3 (three) times daily.   gabapentin 300 MG capsule Commonly known as: NEURONTIN Take 300 mg by mouth at bedtime.   HYDROcodone-acetaminophen 5-325 MG tablet Commonly known as: NORCO/VICODIN TAKE 1 TABLET BY MOUTH 3 TIMES A DAY AS NEEDED FOR PAIN. MAY MAKE YOU DROWSY.THESE TABLETS HAVE '325MG'$  ACETAMINOPHEN(GENERIC FOR TYLENOL);MAXIMUM SAFE DOSE '3000MG'$  ACETAMINOPHEN A DAY. DOSE ADJUSTMENT   insulin detemir 100 UNIT/ML injection Commonly known as: LEVEMIR Inject 0.93 mLs (93 Units total) into the skin 2 (two) times daily. What changed: how much to take   insulin glargine-yfgn 100 UNIT/ML injection Commonly known as: SEMGLEE 2 (two) times daily.   ipratropium-albuterol 0.5-2.5 (3) MG/3ML Soln Commonly known as: DUONEB Take 3 mLs by nebulization every 6 (six) hours as needed (wheezing).   irbesartan 150 MG tablet Commonly known as: AVAPRO Take 150 mg by mouth daily.   metoprolol tartrate 25 MG tablet Commonly known as: LOPRESSOR Take 1 tablet (25 mg total) by mouth 2 (two) times daily.   montelukast 10 MG tablet Commonly known as: SINGULAIR Take 10 mg by mouth at bedtime.   multivitamin with minerals Tabs tablet Take 1 tablet by mouth daily.   nitroGLYCERIN 0.4 MG SL tablet Commonly known as: NITROSTAT PLACE 1 TABLET UNDER TONGUE EVERY 5 MIN AS NEEDED FOR CHEST PAIN IF NO RELIEF IN15 MIN CALL 911 (MAX 3 TABS)   sitaGLIPtin-metformin 50-1000 MG tablet Commonly known as: JANUMET Take 1 tablet by mouth 2 (two) times daily with a meal.   tamsulosin 0.4 MG Caps capsule Commonly known as: FLOMAX Take 0.4 mg by mouth daily.        Allergies:  Allergies  Allergen Reactions   Lisinopril Cough    Family History: Family History  Problem Relation Age of Onset   Heart attack Mother    Melanoma Father    Parkinsonism Father      Social History:  reports that he quit smoking about 4 years ago. His smoking use included cigarettes. He has a 50.00 pack-year smoking history. He has never used smokeless tobacco. He reports that he does not drink alcohol and does not use drugs.   Physical Exam: BP 104/63   Pulse 73   Ht '5\' 11"'$  (1.803 m)   Wt 225 lb (102.1 kg)   BMI 31.38 kg/m   Constitutional:  Alert and oriented, No acute distress. HEENT:  AT, moist mucus membranes.  Trachea midline, no masses. Neurologic: Grossly intact, no focal deficits, moving all 4 extremities. Psychiatric: Normal mood and affect.  Pertinent imaging: CLINICAL DATA:  Flank pain with associated dizziness and nausea   EXAM: CT ABDOMEN AND PELVIS WITHOUT CONTRAST   TECHNIQUE: Multidetector CT imaging of the abdomen and pelvis was performed following the standard protocol without IV contrast.   RADIATION DOSE REDUCTION: This exam was performed according to the departmental dose-optimization program which includes automated exposure control, adjustment of the mA and/or kV according to patient size and/or use of iterative reconstruction technique.   COMPARISON:  None Available.   FINDINGS: Lower chest: No acute abnormality.   Hepatobiliary: No focal liver abnormality is seen. No gallstones, gallbladder wall thickening, or biliary dilatation.   Pancreas: Unremarkable. No pancreatic ductal dilatation or surrounding inflammatory changes.   Spleen: Normal in size without focal abnormality.   Adrenals/Urinary Tract: There are bilateral upper and lower pole nephrolith measuring up to 1.3 cm on the right and 0.9 cm on the left. There is no evidence of hydronephrosis. The urinary bladder is fluid-filled without evidence of layering renal stones.   Stomach/Bowel: Stomach is within normal limits. Appendix appears normal. No evidence of bowel wall thickening, distention, or inflammatory changes. There are bilateral fat containing  inguinal hernias. Diverticulosis without diverticulitis.   Vascular/Lymphatic: Aortic atherosclerosis. No enlarged abdominal or pelvic lymph nodes.   Reproductive: Prostate is unremarkable.   Other: There is a small focal outpouching adjacent to the right transverse abdominis muscle body (series 2, image 20). No abdominopelvic ascites.   Musculoskeletal: There is soft tissue stranding along the bilateral anterior abdominal walls, possibly related to medication injection.   IMPRESSION: 1. Bilateral nonobstructing renal stones measuring up to 1.3 cm on the right and 0.9 cm on the left. 2. Small focal outpouching adjacent to the right transverse abdominis muscle body could represent an early lumbar hernia. 3. Soft tissue stranding along the anterior abdominal wall is likely related to medication administration; correlate with history.     Electronically Signed   By: Marin Roberts M.D.   On: 12/20/2021 16:17  This was personally reviewed and I agree with the radiologic interpretation. He has a large cluster of stones occupying the majority of the right upper pole calyx.  Assessment & Plan:    1. BPH with lower urinary tract symptoms - Managed conservatively on Flomax. - We will defer PSA screening in light of his aging comorbidities.  2. Nephrolisthiasis - Bilateral non-obstructing stones right greater than left - We will plan for conservative management in light of his medical comorbidities.  - Plan for KUB in August.   3. Microscopic hematuria - Heavy smoking history. - We will defer additional upper tract imaging given that it is  most likely stone related and that he has had a recent stone protocol CT. - Consider additional upper tract imaging if he develops gross hematuria or any other symptoms.   Return in about 5 months (around 11/07/2022) for repeat KUB.  [facility] [address], [place], [place] [address] [phone]

## 2022-06-12 ENCOUNTER — Encounter: Payer: Self-pay | Admitting: Student in an Organized Health Care Education/Training Program

## 2022-06-12 ENCOUNTER — Ambulatory Visit
Payer: Medicare HMO | Attending: Student in an Organized Health Care Education/Training Program | Admitting: Student in an Organized Health Care Education/Training Program

## 2022-06-12 VITALS — BP 118/67 | HR 63 | Temp 97.1°F | Resp 18 | Ht 71.0 in | Wt 225.0 lb

## 2022-06-12 DIAGNOSIS — M170 Bilateral primary osteoarthritis of knee: Secondary | ICD-10-CM | POA: Diagnosis not present

## 2022-06-12 DIAGNOSIS — M65342 Trigger finger, left ring finger: Secondary | ICD-10-CM

## 2022-06-12 DIAGNOSIS — E1140 Type 2 diabetes mellitus with diabetic neuropathy, unspecified: Secondary | ICD-10-CM

## 2022-06-12 DIAGNOSIS — G894 Chronic pain syndrome: Secondary | ICD-10-CM

## 2022-06-12 DIAGNOSIS — M79642 Pain in left hand: Secondary | ICD-10-CM | POA: Diagnosis not present

## 2022-06-12 NOTE — Assessment & Plan Note (Signed)
Discussed trigger finger injection under ultrasound guidance

## 2022-06-12 NOTE — Assessment & Plan Note (Signed)
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  Repeat 3 months from previous procedure

## 2022-06-12 NOTE — Patient Instructions (Addendum)
GENERAL RISKS AND COMPLICATIONS  What are the risk, side effects and possible complications? Generally speaking, most procedures are safe.  However, with any procedure there are risks, side effects, and the possibility of complications.  The risks and complications are dependent upon the sites that are lesioned, or the type of nerve block to be performed.  The closer the procedure is to the spine, the more serious the risks are.  Great care is taken when placing the radio frequency needles, block needles or lesioning probes, but sometimes complications can occur. Infection: Any time there is an injection through the skin, there is a risk of infection.  This is why sterile conditions are used for these blocks.  There are four possible types of infection. Localized skin infection. Central Nervous System Infection-This can be in the form of Meningitis, which can be deadly. Epidural Infections-This can be in the form of an epidural abscess, which can cause pressure inside of the spine, causing compression of the spinal cord with subsequent paralysis. This would require an emergency surgery to decompress, and there are no guarantees that the patient would recover from the paralysis. Discitis-This is an infection of the intervertebral discs.  It occurs in about 1% of discography procedures.  It is difficult to treat and it may lead to surgery.        2. Pain: the needles have to go through skin and soft tissues, will cause soreness.       3. Damage to internal structures:  The nerves to be lesioned may be near blood vessels or    other nerves which can be potentially damaged.       4. Bleeding: Bleeding is more common if the patient is taking blood thinners such as  aspirin, Coumadin, Ticiid, Plavix, etc., or if he/she have some genetic predisposition  such as hemophilia. Bleeding into the spinal canal can cause compression of the spinal  cord with subsequent paralysis.  This would require an emergency  surgery to  decompress and there are no guarantees that the patient would recover from the  paralysis.       5. Pneumothorax:  Puncturing of a lung is a possibility, every time a needle is introduced in  the area of the chest or upper back.  Pneumothorax refers to free air around the  collapsed lung(s), inside of the thoracic cavity (chest cavity).  Another two possible  complications related to a similar event would include: Hemothorax and Chylothorax.   These are variations of the Pneumothorax, where instead of air around the collapsed  lung(s), you may have blood or chyle, respectively.       6. Spinal headaches: They may occur with any procedures in the area of the spine.       7. Persistent CSF (Cerebro-Spinal Fluid) leakage: This is a rare problem, but may occur  with prolonged intrathecal or epidural catheters either due to the formation of a fistulous  track or a dural tear.       8. Nerve damage: By working so close to the spinal cord, there is always a possibility of  nerve damage, which could be as serious as a permanent spinal cord injury with  paralysis.       9. Death:  Although rare, severe deadly allergic reactions known as "Anaphylactic  reaction" can occur to any of the medications used.      10. Worsening of the symptoms:  We can always make thing worse.  What are the  chances of something like this happening? Chances of any of this occuring are extremely low.  By statistics, you have more of a chance of getting killed in a motor vehicle accident: while driving to the hospital than any of the above occurring .  Nevertheless, you should be aware that they are possibilities.  In general, it is similar to taking a shower.  Everybody knows that you can slip, hit your head and get killed.  Does that mean that you should not shower again?  Nevertheless always keep in mind that statistics do not mean anything if you happen to be on the wrong side of them.  Even if a procedure has a 1 (one) in a  1,000,000 (million) chance of going wrong, it you happen to be that one..Also, keep in mind that by statistics, you have more of a chance of having something go wrong when taking medications.  Who should not have this procedure? If you are on a blood thinning medication (e.g. Coumadin, Plavix, see list of "Blood Thinners"), or if you have an active infection going on, you should not have the procedure.  If you are taking any blood thinners, please inform your physician.  How should I prepare for this procedure? Do not eat or drink anything at least six hours prior to the procedure. Bring a driver with you .  It cannot be a taxi. Come accompanied by an adult that can drive you back, and that is strong enough to help you if your legs get weak or numb from the local anesthetic. Take all of your medicines the morning of the procedure with just enough water to swallow them. If you have diabetes, make sure that you are scheduled to have your procedure done first thing in the morning, whenever possible. If you have diabetes, take only half of your insulin dose and notify our nurse that you have done so as soon as you arrive at the clinic. If you are diabetic, but only take blood sugar pills (oral hypoglycemic), then do not take them on the morning of your procedure.  You may take them after you have had the procedure. Do not take aspirin or any aspirin-containing medications, at least eleven (11) days prior to the procedure.  They may prolong bleeding. Wear loose fitting clothing that may be easy to take off and that you would not mind if it got stained with Betadine or blood. Do not wear any jewelry or perfume Remove any nail coloring.  It will interfere with some of our monitoring equipment.  NOTE: Remember that this is not meant to be interpreted as a complete list of all possible complications.  Unforeseen problems may occur.  BLOOD THINNERS The following drugs contain aspirin or other products,  which can cause increased bleeding during surgery and should not be taken for 2 weeks prior to and 1 week after surgery.  If you should need take something for relief of minor pain, you may take acetaminophen which is found in Tylenol,m Datril, Anacin-3 and Panadol. It is not blood thinner. The products listed below are.  Do not take any of the products listed below in addition to any listed on your instruction sheet.  A.P.C or A.P.C with Codeine Codeine Phosphate Capsules #3 Ibuprofen Ridaura  ABC compound Congesprin Imuran rimadil  Advil Cope Indocin Robaxisal  Alka-Seltzer Effervescent Pain Reliever and Antacid Coricidin or Coricidin-D  Indomethacin Rufen  Alka-Seltzer plus Cold Medicine Cosprin Ketoprofen S-A-C Tablets  Anacin Analgesic Tablets or Capsules  Coumadin Korlgesic Salflex  Anacin Extra Strength Analgesic tablets or capsules CP-2 Tablets Lanoril Salicylate  Anaprox Cuprimine Capsules Levenox Salocol  Anexsia-D Dalteparin Magan Salsalate  Anodynos Darvon compound Magnesium Salicylate Sine-off  Ansaid Dasin Capsules Magsal Sodium Salicylate  Anturane Depen Capsules Marnal Soma  APF Arthritis pain formula Dewitt's Pills Measurin Stanback  Argesic Dia-Gesic Meclofenamic Sulfinpyrazone  Arthritis Bayer Timed Release Aspirin Diclofenac Meclomen Sulindac  Arthritis pain formula Anacin Dicumarol Medipren Supac  Analgesic (Safety coated) Arthralgen Diffunasal Mefanamic Suprofen  Arthritis Strength Bufferin Dihydrocodeine Mepro Compound Suprol  Arthropan liquid Dopirydamole Methcarbomol with Aspirin Synalgos  ASA tablets/Enseals Disalcid Micrainin Tagament  Ascriptin Doan's Midol Talwin  Ascriptin A/D Dolene Mobidin Tanderil  Ascriptin Extra Strength Dolobid Moblgesic Ticlid  Ascriptin with Codeine Doloprin or Doloprin with Codeine Momentum Tolectin  Asperbuf Duoprin Mono-gesic Trendar  Aspergum Duradyne Motrin or Motrin IB Triminicin  Aspirin plain, buffered or enteric coated  Durasal Myochrisine Trigesic  Aspirin Suppositories Easprin Nalfon Trillsate  Aspirin with Codeine Ecotrin Regular or Extra Strength Naprosyn Uracel  Atromid-S Efficin Naproxen Ursinus  Auranofin Capsules Elmiron Neocylate Vanquish  Axotal Emagrin Norgesic Verin  Azathioprine Empirin or Empirin with Codeine Normiflo Vitamin E  Azolid Emprazil Nuprin Voltaren  Bayer Aspirin plain, buffered or children's or timed BC Tablets or powders Encaprin Orgaran Warfarin Sodium  Buff-a-Comp Enoxaparin Orudis Zorpin  Buff-a-Comp with Codeine Equegesic Os-Cal-Gesic   Buffaprin Excedrin plain, buffered or Extra Strength Oxalid   Bufferin Arthritis Strength Feldene Oxphenbutazone   Bufferin plain or Extra Strength Feldene Capsules Oxycodone with Aspirin   Bufferin with Codeine Fenoprofen Fenoprofen Pabalate or Pabalate-SF   Buffets II Flogesic Panagesic   Buffinol plain or Extra Strength Florinal or Florinal with Codeine Panwarfarin   Buf-Tabs Flurbiprofen Penicillamine   Butalbital Compound Four-way cold tablets Penicillin   Butazolidin Fragmin Pepto-Bismol   Carbenicillin Geminisyn Percodan   Carna Arthritis Reliever Geopen Persantine   Carprofen Gold's salt Persistin   Chloramphenicol Goody's Phenylbutazone   Chloromycetin Haltrain Piroxlcam   Clmetidine heparin Plaquenil   Cllnoril Hyco-pap Ponstel   Clofibrate Hydroxy chloroquine Propoxyphen         Before stopping any of these medications, be sure to consult the physician who ordered them.  Some, such as Coumadin (Warfarin) are ordered to prevent or treat serious conditions such as "deep thrombosis", "pumonary embolisms", and other heart problems.  The amount of time that you may need off of the medication may also vary with the medication and the reason for which you were taking it.  If you are taking any of these medications, please make sure you notify your pain physician before you undergo any procedures.         Capsaicin  Patches What is this medication? CAPSAICIN (cap SAY sin) relieves minor pain in your muscles and joints. It works by making your skin feel warm or cool, which blocks pain signals going to the brain. This medicine may be used for other purposes; ask your health care provider or pharmacist if you have questions. COMMON BRAND NAME(S): Qutenza What should I tell my care team before I take this medication? They need to know if you have any of these conditions: Broken or irritated skin High blood pressure History of heart attack or stroke An unusual or allergic reaction to capsaicin, hot peppers, other medications, foods, dyes, or preservatives Pregnant or trying to get pregnant Breast-feeding How should I use this medication? This medication is for external use only. It is applied by your care team in  a hospital or clinic setting. Talk to your care team about the use of this medication in children. Special care may be needed. Overdosage: If you think you have taken too much of this medicine contact a poison control center or emergency room at once. NOTE: This medicine is only for you. Do not share this medicine with others. What if I miss a dose? This does not apply. What may interact with this medication? Interactions are not expected. Do not use any other skin products on the affected area without asking your care team. This list may not describe all possible interactions. Give your health care provider a list of all the medicines, herbs, non-prescription drugs, or dietary supplements you use. Also tell them if you smoke, drink alcohol, or use illegal drugs. Some items may interact with your medicine. What should I watch for while using this medication? Your condition will be monitored carefully while you are receiving this medication. Your blood pressure may go up during the procedure. Do not touch the medication patch during treatment. This medication causes red, burning skin. You may need  pain medication for during and after the procedure. This medication can make you more sensitive to heat for a few days after treatment. Be careful in hot showers or baths. Keep out of the sun. Exercise may make the treated skin feel hotter. Tell your care team if your symptoms do not start to get better or if they get worse. What side effects may I notice from receiving this medication? Side effects that you should report to your care team as soon as possible: Allergic reactions--skin rash, itching, hives, swelling of the face, lips, tongue, or throat Side effects that usually do not require medical attention (report these to your care team if they continue or are bothersome): Mild skin irritation, redness, or dryness This list may not describe all possible side effects. Call your doctor for medical advice about side effects. You may report side effects to FDA at 1-800-FDA-1088. Where should I keep my medication? This medication is given in a hospital or clinic. It will not be stored at home. NOTE: This sheet is a summary. It may not cover all possible information. If you have questions about this medicine, talk to your doctor, pharmacist, or health care provider.  2023 Elsevier/Gold Standard (2020-11-22 00:00:00) Sodium Hyaluronate intra-articular injection What is this medication? SODIUM HYALURONATE (SOE dee um hye al yoor ON ate) is used to treat pain in the knee due to osteoarthritis. This medicine may be used for other purposes; ask your health care provider or pharmacist if you have questions. This medicine may be used for other purposes; ask your health care provider or pharmacist if you have questions. COMMON BRAND NAME(S): Amvisc, DUROLANE, Euflexxa, GELSYN-3, Hyalgan, Hymovis, Monovisc, Orthovisc, Supartz, Supartz FX, TriVisc, VISCO What should I tell my care team before I take this medication? They need to know if you have any of these conditions: bleeding  disorders glaucoma infection in the knee joint skin conditions or sensitivity skin infection an unusual allergic reaction to sodium hyaluronate, other medicines, foods, dyes, or preservatives. Different brands of sodium hyaluronate contain different allergens. Some may contain egg. Talk to your doctor about your allergies to make sure that you get the right product. pregnant or trying to get pregnant breast-feeding How should I use this medication? This medicine is for injection into the knee joint. It is given by a health care professional in a hospital or clinic setting. Talk to your pediatrician  regarding the use of this medicine in children. Special care may be needed. Overdosage: If you think you have taken too much of this medicine contact a poison control center or emergency room at once. NOTE: This medicine is only for you. Do not share this medicine with others. Overdosage: If you think you have taken too much of this medicine contact a poison control center or emergency room at once. NOTE: This medicine is only for you. Do not share this medicine with others. What if I miss a dose? This does not apply. What may interact with this medication? Interactions are not expected. This list may not describe all possible interactions. Give your health care provider a list of all the medicines, herbs, non-prescription drugs, or dietary supplements you use. Also tell them if you smoke, drink alcohol, or use illegal drugs. Some items may interact with your medicine. This list may not describe all possible interactions. Give your health care provider a list of all the medicines, herbs, non-prescription drugs, or dietary supplements you use. Also tell them if you smoke, drink alcohol, or use illegal drugs. Some items may interact with your medicine. What should I watch for while using this medication? Tell your doctor or healthcare professional if your symptoms do not start to get better or if they  get worse. If receiving this medicine for osteoarthritis, limit your activity after you receive your injection. Avoid physical activity for 48 hours following your injection to keep your knee from swelling. Do not stand on your feet for more than 1 hour at a time during the first 48 hours following your injection. Ask your doctor or healthcare professional about when you can begin major physical activity again. What side effects may I notice from receiving this medication? Side effects that you should report to your doctor or health care professional as soon as possible: allergic reactions like skin rash, itching or hives, swelling of the face, lips, or tongue dizziness facial flushing pain, tingling, numbness in the hands or feet vision changes if received this medicine during eye surgery Side effects that usually do not require medical attention (report to your doctor or health care professional if they continue or are bothersome): back pain bruising at site where injected chills diarrhea fever headache joint pain joint stiffness joint swelling muscle cramps muscle pain nausea, vomiting pain, redness, or irritation at site where injected weak or tired This list may not describe all possible side effects. Call your doctor for medical advice about side effects. You may report side effects to FDA at 1-800-FDA-1088. This list may not describe all possible side effects. Call your doctor for medical advice about side effects. You may report side effects to FDA at 1-800-FDA-1088. Where should I keep my medication? This drug is given in a hospital or clinic and will not be stored at home. NOTE: This sheet is a summary. It may not cover all possible information. If you have questions about this medicine, talk to your doctor, pharmacist, or health care provider.  2023 Elsevier/Gold Standard (2012-08-19 00:00:00)

## 2022-06-12 NOTE — Assessment & Plan Note (Signed)
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

## 2022-06-12 NOTE — Progress Notes (Signed)
PROVIDER NOTE: Information contained herein reflects review and annotations entered in association with encounter. Interpretation of such information and data should be left to medically-trained personnel. Information provided to patient can be located elsewhere in the medical record under "Patient Instructions". Document created using STT-dictation technology, any transcriptional errors that may result from process are unintentional.    Patient: Henry [name]  Service Category: E/M  Provider: Gillis Santa, MD  DOB: [birth year]  DOS: 06/12/2022  Referring Provider: Juluis Pitch, MD  MRN: [id number]  Specialty: Interventional Pain Management  PCP: Juluis Pitch, MD  Type: Established Patient  Setting: Ambulatory outpatient    Location: Office  Delivery: Face-to-face     HPI  Henry [name], a 77 y.o. year old male, is here today because of his Chronic painful diabetic neuropathy ([place]) [E11.40]. Henry [name] primary complain today is Hand Pain (bilat), Knee Pain (bilat), and Foot Pain (bilat)  Pertinent problems: Henry [name] has Chronic painful diabetic neuropathy ([place]); Acquired trigger finger of left ring finger; Left hand pain; Chronic pain syndrome; and Bilateral primary osteoarthritis of knee on their pertinent problem list. Pain Assessment: Severity of Chronic pain is reported as a 5 /10. Location: Hand Right, Left/denies. Onset: More than a month ago. Quality: Aching, Constant, Burning. Timing: Constant. Modifying factor(s): voltaren. Vitals:  height is '5\' 11"'$  (1.803 m) and weight is 225 lb (102.1 kg). His temporal temperature is 97.1 F (36.2 C) (abnormal). His blood pressure is 118/67 and his pulse is 63. His respiration is 18 and oxygen saturation is 94%.  BMI: Estimated body mass index is 31.38 kg/m as calculated from the following:   Height as of this encounter: '5\' 11"'$  (1.803 m).   Weight as of this encounter: 225 lb (102.1 kg). Last encounter: 04/23/2022. Last  procedure: 05/13/2022.  Reason for encounter: post-procedure evaluation and assessment.   Post-procedure evaluation   Qutenza Neurolysis #1  Laterality:  Bilateral Area treated: Feet Imaging Guidance: None Anesthesia/analgesia/anxiolysis/sedation: None required Medication (Right): Qutenza patches Medication (Left): Qutenza patches Date: 05/13/2022 Performed by: Gillis Santa, MD 1. Chronic painful diabetic neuropathy (HCC)    NAS-11 Pain score:   Pre-procedure: 9 /10   Post-procedure: 9 /10      Effectiveness:  Initial hour after procedure: 90 %  Subsequent 4-6 hours post-procedure: 90 %  Analgesia past initial 6 hours: 90 %  Ongoing improvement:  Analgesic:  90%     ROS  Constitutional: Denies any fever or chills Gastrointestinal: No reported hemesis, hematochezia, vomiting, or acute GI distress Musculoskeletal:  Bilateral knee pain, left ring trigger finger Neurological:  Paresthesias of bilateral feet  Medication Review  DULoxetine, Fluticasone-Salmeterol, HYDROcodone-acetaminophen, albuterol, aspirin EC, atorvastatin, cetirizine, diclofenac Sodium, empagliflozin, fluticasone, furosemide, gabapentin, insulin detemir, insulin glargine-yfgn, ipratropium-albuterol, irbesartan, metoprolol tartrate, montelukast, multivitamin with minerals, nitroGLYCERIN, sitaGLIPtin-metformin, and tamsulosin  History Review  Allergy: Henry [name] is allergic to lisinopril. Drug: Henry [name]  reports no history of drug use. Alcohol:  reports no history of alcohol use. Tobacco:  reports that he quit smoking about 4 years ago. His smoking use included cigarettes. He has a 50.00 pack-year smoking history. He has never used smokeless tobacco. Social: Henry [name]  reports that he quit smoking about 4 years ago. His smoking use included cigarettes. He has a 50.00 pack-year smoking history. He has never used smokeless tobacco. He reports that he does not drink alcohol and does not use  drugs. Medical:  has a past medical history of Anginal pain ([place]), Arthritis, CAD (coronary artery  disease), Cancer ([place]), CKD (chronic kidney disease), stage II, COPD (chronic obstructive pulmonary disease) ([place]), Glaucoma, History of kidney stones, Hyperlipidemia, Hypertension, Insulin dependent diabetes mellitus, Ischemic cardiomyopathy, Myocardial infarction ([place]), Neuropathy, and S/P angioplasty with stent 08/10/17 emergently to LAD ,  08/13/17 atherectomy and stent to pRCA  (08/14/2017). Surgical: Henry [name]  has a past surgical history that includes Lithotripsy; Colonoscopy with propofol (N/A, 12/08/2014); Parotidectomy (2015); Extracorporeal shock wave lithotripsy (Right, 11/09/2015); Tonsillectomy; Colonoscopy with propofol (N/A, 01/10/2016); Extracorporeal shock wave lithotripsy (Left, 10/17/2016); Coronary/Graft Acute MI Revascularization (N/A, 08/10/2017); LEFT HEART CATH AND CORONARY ANGIOGRAPHY (N/A, 08/10/2017); CORONARY ATHERECTOMY (N/A, 08/13/2017); CORONARY STENT INTERVENTION (N/A, 08/13/2017); TEMPORARY PACEMAKER (N/A, 08/13/2017); Cardiac catheterization; Colonoscopy with propofol (N/A, 04/28/2019); and Colonoscopy with propofol (N/A, 03/17/2020). Family: family history includes Heart attack in his mother; Melanoma in his father; Parkinsonism in his father.  Laboratory Chemistry Profile   Renal Lab Results  Component Value Date   BUN 23 12/20/2021   CREATININE 1.27 (H) 12/20/2021   GFRAA 57 (L) 12/25/2019   GFRNONAA 59 (L) 12/20/2021    Hepatic Lab Results  Component Value Date   AST 33 12/20/2021   ALT 30 12/20/2021   ALBUMIN 4.3 12/20/2021   ALKPHOS 65 12/20/2021   LIPASE 54 (H) 08/02/2020    Electrolytes Lab Results  Component Value Date   NA 141 12/20/2021   K 4.1 12/20/2021   CL 100 12/20/2021   CALCIUM 10.4 (H) 12/20/2021   MG 2.3 08/11/2017   PHOS 2.3 (L) 08/11/2017    Bone No results found for: "VD25OH", "VD125OH2TOT", "[id number]", "[id number]", "25OHVITD1", "25OHVITD2",  "25OHVITD3", "TESTOFREE", "TESTOSTERONE"  Inflammation (CRP: Acute Phase) (ESR: Chronic Phase) Lab Results  Component Value Date   LATICACIDVEN 2.9 ([place]) 02/11/2021         Note: Above Lab results reviewed.  Recent Imaging Review  CT Renal Stone Study CLINICAL DATA:  Flank pain with associated dizziness and nausea  EXAM: CT ABDOMEN AND PELVIS WITHOUT CONTRAST  TECHNIQUE: Multidetector CT imaging of the abdomen and pelvis was performed following the standard protocol without IV contrast.  RADIATION DOSE REDUCTION: This exam was performed according to the departmental dose-optimization program which includes automated exposure control, adjustment of the mA and/or kV according to patient size and/or use of iterative reconstruction technique.  COMPARISON:  None Available.  FINDINGS: Lower chest: No acute abnormality.  Hepatobiliary: No focal liver abnormality is seen. No gallstones, gallbladder wall thickening, or biliary dilatation.  Pancreas: Unremarkable. No pancreatic ductal dilatation or surrounding inflammatory changes.  Spleen: Normal in size without focal abnormality.  Adrenals/Urinary Tract: There are bilateral upper and lower pole nephrolith measuring up to 1.3 cm on the right and 0.9 cm on the left. There is no evidence of hydronephrosis. The urinary bladder is fluid-filled without evidence of layering renal stones.  Stomach/Bowel: Stomach is within normal limits. Appendix appears normal. No evidence of bowel wall thickening, distention, or inflammatory changes. There are bilateral fat containing inguinal hernias. Diverticulosis without diverticulitis.  Vascular/Lymphatic: Aortic atherosclerosis. No enlarged abdominal or pelvic lymph nodes.  Reproductive: Prostate is unremarkable.  Other: There is a small focal outpouching adjacent to the right transverse abdominis muscle body (series 2, image 20). No abdominopelvic ascites.  Musculoskeletal: There is  soft tissue stranding along the bilateral anterior abdominal walls, possibly related to medication injection.  IMPRESSION: 1. Bilateral nonobstructing renal stones measuring up to 1.3 cm on the right and 0.9 cm on the left. 2. Small focal outpouching adjacent to the right transverse abdominis muscle  body could represent an early lumbar hernia. 3. Soft tissue stranding along the anterior abdominal wall is likely related to medication administration; correlate with history.  Electronically Signed   By: Marin Roberts M.D.   On: 12/20/2021 16:17 Note: Reviewed        Physical Exam  General appearance: Well nourished, well developed, and well hydrated. In no apparent acute distress Mental status: Alert, oriented x 3 (person, place, & time)       Respiratory: No evidence of acute respiratory distress Eyes: PERLA Vitals: BP 118/67   Pulse 63   Temp (!) 97.1 F (36.2 C) (Temporal)   Resp 18   Ht '5\' 11"'$  (1.803 m)   Wt 225 lb (102.1 kg)   SpO2 94%   BMI 31.38 kg/m  BMI: Estimated body mass index is 31.38 kg/m as calculated from the following:   Height as of this encounter: '5\' 11"'$  (1.803 m).   Weight as of this encounter: 225 lb (102.1 kg). Ideal: Ideal body weight: 75.3 kg (166 lb 0.1 oz) Adjusted ideal body weight: 86 kg (189 lb 9.7 oz)  Left ring trigger finger painful along base of metacarpal  Arthropathic arthralgia of bilateral knees more pronounced along the medial border  Paresthesias of bilateral feet  5 out of 5 strength bilateral lower extremity: Plantar flexion, dorsiflexion, knee flexion, knee extension.   Assessment   Diagnosis Status  1. Chronic painful diabetic neuropathy ([place])   2. Bilateral primary osteoarthritis of knee   3. Acquired trigger finger of left ring finger   4. Left hand pain   5. Chronic pain syndrome    Responding Deteriorating Worsening   Updated Problems: Problem  Bilateral Primary Osteoarthritis of Knee  Chronic Painful Diabetic  Neuropathy (H[facility])  Acquired Trigger Finger of Left Ring Finger    Plan of Care  Problem-specific:  Chronic painful diabetic neuropathy (HCC) 90% pain relief with Qutenza treatment for chronic painful diabetic neuropathy of bilateral feet.  Repeat 3 months from previous procedure  Acquired trigger finger of left ring finger Discussed trigger finger injection under ultrasound guidance  Bilateral primary osteoarthritis of knee Discussed viscosupplementation for bilateral knee pain related to knee osteoarthritis  Henry [name] has a current medication list which includes the following long-term medication(s): albuterol, atorvastatin, duloxetine, fluticasone, fluticasone-salmeterol, furosemide, gabapentin, insulin detemir, ipratropium-albuterol, irbesartan, metoprolol tartrate, montelukast, and nitroglycerin.    Orders:  Orders Placed This Encounter  Procedures   Injection tendon or ligament    Standing Status:   Future    Standing Expiration Date:   09/12/2022    Scheduling Instructions:     Left ring finger trigger finger   KNEE INJECTION    Indications: Knee arthralgia (pain) due to osteoarthritis (OA) Imaging: None (CPT-20610) Position: Sitting Equipment/Materials: Block tray  1.5", 25-G (one per side)  Local anesthetic  Monovisc (one per side) Confirm availability (in office) of Monovisc (HMW hyaluronan)    Standing Status:   Future    Standing Expiration Date:   06/12/2023    Scheduling Instructions:     Procedure: Knee injection Monovisc (Hyaluronan/Hyaluronic acid)     Treatment No.:   1     Level: Intra-articular     Laterality: Bilateral Knee     Sedation: Patient's choice.    Order Specific Question:   Where will this procedure be performed?    Answer:   [facility]   NEUROLYSIS    Please order Qutenza patches from pharmacy    Standing  Status:   Future    Standing Expiration Date:   09/12/2022    Scheduling Instructions:     Qutenza for painful  diabetic neuropathy    Order Specific Question:   Where will this procedure be performed?    Answer:   [facility]   Follow-up plan:   Return in about 1 week (around 06/19/2022) for left ring trigger finger and bilateral gel syn-3.     Recent Visits Date Type Provider Dept  05/13/22 Procedure visit Gillis Santa, MD [facility]  04/23/22 Office Visit Gillis Santa, MD [facility]  Showing recent visits within past 90 days and meeting all other requirements Today's Visits Date Type Provider Dept  06/12/22 Office Visit Gillis Santa, MD [facility]  Showing today's visits and meeting all other requirements Future Appointments No visits were found meeting these conditions. Showing future appointments within next 90 days and meeting all other requirements  I discussed the assessment and treatment plan with the patient. The patient was provided an opportunity to ask questions and all were answered. The patient agreed with the plan and demonstrated an understanding of the instructions.  Patient advised to call back or seek an in-person evaluation if the symptoms or condition worsens.  Duration of encounter: 33mnutes.  Total time on encounter, as per AMA guidelines included both the face-to-face and non-face-to-face time personally spent by the physician and/or other qualified health care professional(s) on the day of the encounter (includes time in activities that require the physician or other qualified health care professional and does not include time in activities normally performed by clinical staff). Physician's time may include the following activities when performed: Preparing to see the patient (e.g., pre-charting review of records, searching for previously ordered imaging, lab work, and nerve conduction tests) Review of prior analgesic pharmacotherapies. Reviewing PMP Interpreting ordered tests (e.g., lab work, imaging, nerve conduction  tests) Performing post-procedure evaluations, including interpretation of diagnostic procedures Obtaining and/or reviewing separately obtained history Performing a medically appropriate examination and/or evaluation Counseling and educating the patient/family/caregiver Ordering medications, tests, or procedures Referring and communicating with other health care professionals (when not separately reported) Documenting clinical information in the electronic or other health record Independently interpreting results (not separately reported) and communicating results to the patient/ family/caregiver Care coordination (not separately reported)  Note by: BGillis Santa MD Date: 06/12/2022; Time: 1:41 PM

## 2022-06-12 NOTE — Progress Notes (Signed)
Safety precautions to be maintained throughout the outpatient stay will include: orient to surroundings, keep bed in low position, maintain call bell within reach at all times, provide assistance with transfer out of bed and ambulation.  

## 2022-06-18 DIAGNOSIS — R200 Anesthesia of skin: Secondary | ICD-10-CM | POA: Diagnosis not present

## 2022-06-18 DIAGNOSIS — R202 Paresthesia of skin: Secondary | ICD-10-CM | POA: Diagnosis not present

## 2022-06-19 ENCOUNTER — Ambulatory Visit: Payer: Self-pay | Admitting: Urology

## 2022-06-25 ENCOUNTER — Encounter: Payer: Self-pay | Admitting: Student in an Organized Health Care Education/Training Program

## 2022-06-25 ENCOUNTER — Ambulatory Visit
Payer: Medicare HMO | Attending: Student in an Organized Health Care Education/Training Program | Admitting: Student in an Organized Health Care Education/Training Program

## 2022-06-25 VITALS — BP 144/83 | HR 71 | Temp 97.2°F | Resp 16 | Ht 71.0 in | Wt 220.0 lb

## 2022-06-25 DIAGNOSIS — E1140 Type 2 diabetes mellitus with diabetic neuropathy, unspecified: Secondary | ICD-10-CM | POA: Diagnosis not present

## 2022-06-25 DIAGNOSIS — M65342 Trigger finger, left ring finger: Secondary | ICD-10-CM | POA: Insufficient documentation

## 2022-06-25 DIAGNOSIS — M170 Bilateral primary osteoarthritis of knee: Secondary | ICD-10-CM | POA: Diagnosis not present

## 2022-06-25 DIAGNOSIS — G894 Chronic pain syndrome: Secondary | ICD-10-CM

## 2022-06-25 MED ORDER — SODIUM HYALURONATE (VISCOSUP) 16.8 MG/2ML IX SOSY
16.8000 mg | PREFILLED_SYRINGE | Freq: Once | INTRA_ARTICULAR | Status: AC
  Administered 2022-06-25: 16.8 mg via INTRA_ARTICULAR
  Filled 2022-06-25: qty 2

## 2022-06-25 MED ORDER — DEXAMETHASONE SODIUM PHOSPHATE 10 MG/ML IJ SOLN
10.0000 mg | Freq: Once | INTRAMUSCULAR | Status: AC
Start: 2022-06-25 — End: 2022-06-25
  Administered 2022-06-25: 10 mg

## 2022-06-25 MED ORDER — LIDOCAINE HCL 2 % IJ SOLN
INTRAMUSCULAR | Status: AC
  Filled 2022-06-25: qty 20

## 2022-06-25 MED ORDER — ROPIVACAINE HCL 2 MG/ML IJ SOLN
INTRAMUSCULAR | Status: AC
  Filled 2022-06-25: qty 20

## 2022-06-25 MED ORDER — DEXAMETHASONE SODIUM PHOSPHATE 10 MG/ML IJ SOLN
INTRAMUSCULAR | Status: AC
Start: 2022-06-25 — End: ?
  Filled 2022-06-25: qty 1

## 2022-06-25 NOTE — Progress Notes (Signed)
Safety precautions to be maintained throughout the outpatient stay will include: orient to surroundings, keep bed in low position, maintain call bell within reach at all times, provide assistance with transfer out of bed and ambulation.  

## 2022-06-25 NOTE — Progress Notes (Signed)
PROVIDER NOTE: Interpretation of information contained herein should be left to medically-trained personnel. Specific patient instructions are provided elsewhere under "Patient Instructions" section of medical record. This document was created in part using STT-dictation technology, any transcriptional errors that may result from this process are unintentional.  Patient: Henry [name] Type: Established DOB: 1945-11-07 MRN: [id number] PCP: Juluis Pitch, MD  Service: Procedure DOS: 06/25/2022 Setting: Ambulatory Location: Ambulatory outpatient facility Delivery: Face-to-face Provider: Gillis Santa, MD Specialty: Interventional Pain Management Specialty designation: 09 Location: Outpatient facility Ref. Prov.: Gillis Santa, MD       Interventional Therapy   Procedure #1:   Type: Gelsyn-3 Intra-articular Knee Injection #1  Laterality: Bilateral (-50) Level/approach: Medial  Procedure #2:  Left ring trigger finger injection   DOS: 06/25/2022  Performed by: Gillis Santa, MD  Purpose: Diagnostic/Therapeutic Indications: Knee arthralgia associated to osteoarthritis of the knee 1. Chronic painful diabetic neuropathy ([place])   2. Chronic pain syndrome   3. Acquired trigger finger of left ring finger   4. Bilateral primary osteoarthritis of knee    NAS-11 score:   Pre-procedure: 8 /10   Post-procedure: 0-No pain/10     Pre-Procedure Preparation  Monitoring: As per clinic protocol.  Risk Assessment: Vitals:  [id number] body mass index is 30.68 kg/m as calculated from the following:   Height as of this encounter: 5\' 11"  (1.803 m).   Weight as of this encounter: 220 lb (99.8 kg)., Rate:71 , BP:(!) 144/83, Resp:16, Temp:(!) 97.2 F (36.2 C), SpO2:94 %  Allergies: He is allergic to lisinopril.  Precautions: No additional precautions required  Blood-thinner(s): None at this time  Coagulopathies: Reviewed. None identified.   Active Infection(s): Reviewed. None identified.  Henry [name] is afebrile   Location setting: Exam room Position: Sitting w/ knee bent 90 degrees Safety Precautions: Patient was assessed for positional comfort and pressure points before starting the procedure. Prepping solution: DuraPrep (Iodine Povacrylex [0.7% available iodine] and Isopropyl Alcohol, 74% w/w) Prep Area: Entire knee region Approach: percutaneous, just above the tibial plateau, lateral to the infrapatellar tendon. Intended target: Intra-articular knee space Materials: Tray: Block Needle(s): Regular Qty: 1/side Length: 1.5-inch Gauge: 25G (x1) + 22G (x1)  Meds ordered this encounter  Medications   sodium hyaluronate (viscosup) (GELSYN-3) intra-articular injection 16.8 mg    Do not substitute. Deliver to facility day before procedure.   sodium hyaluronate (viscosup) (GELSYN-3) intra-articular injection 16.8 mg    Do not substitute. Deliver to facility day before procedure.   dexamethasone (DECADRON) injection 10 mg    Orders Placed This Encounter  Procedures   NEUROLYSIS    Please order Qutenza patches from pharmacy    Standing Status:   Future    Standing Expiration Date:   09/25/2022    Order Specific Question:   Where will this procedure be performed?    Answer:   [facility]     Time-out: [id number] I initiated and conducted the "Time-out" before starting the procedure, as per protocol. The patient was asked to participate by confirming the accuracy of the "Time Out" information. Verification of the correct person, site, and procedure were performed and confirmed by me, the nursing staff, and the patient. "Time-out" conducted as per Joint Commission's Universal Protocol (UP.01.01.01). Procedure checklist: Completed  H&P (Pre-op  Assessment)  Henry [name] is a 77 y.o. (year old), male patient, seen today for interventional treatment. He  has a past surgical history that includes Lithotripsy; Colonoscopy with propofol (N/A, 12/08/2014); Parotidectomy (2015);  Extracorporeal shock wave lithotripsy (  Right, 11/09/2015); Tonsillectomy; Colonoscopy with propofol (N/A, 01/10/2016); Extracorporeal shock wave lithotripsy (Left, 10/17/2016); Coronary/Graft Acute MI Revascularization (N/A, 08/10/2017); LEFT HEART CATH AND CORONARY ANGIOGRAPHY (N/A, 08/10/2017); CORONARY ATHERECTOMY (N/A, 08/13/2017); CORONARY STENT INTERVENTION (N/A, 08/13/2017); TEMPORARY PACEMAKER (N/A, 08/13/2017); Cardiac catheterization; Colonoscopy with propofol (N/A, 04/28/2019); and Colonoscopy with propofol (N/A, 03/17/2020). Henry [name] has a current medication list which includes the following prescription(s): aspirin ec, atorvastatin, cetirizine, diclofenac sodium, duloxetine, empagliflozin, fluticasone, fluticasone-salmeterol, furosemide, gabapentin, hydrocodone-acetaminophen, insulin detemir, irbesartan, metoprolol tartrate, montelukast, multivitamin with minerals, nitroglycerin, sitagliptin-metformin, tamsulosin, albuterol, insulin glargine-yfgn, and ipratropium-albuterol. His primarily concern today is the Knee Pain (Bilateral ) and Hand Pain (Left hand ring finger. )  He is allergic to lisinopril.   Last encounter: My last encounter with him was on 06/12/2022. Pertinent problems: Henry [name] has Chronic painful diabetic neuropathy ([place]); Acquired trigger finger of left ring finger; Left hand pain; Chronic pain syndrome; and Bilateral primary osteoarthritis of knee on their pertinent problem list. Pain Assessment: Severity of  (knees and trigger finger) is reported as a 8 /10. Location: Knee (left ring finger) Left, Right/denies. Onset: More than a month ago. Quality: Aching, Constant, Discomfort. Timing: Constant. Modifying factor(s): voltaren, hydrocodone bid. Vitals:  height is 5\' 11"  (1.803 m) and weight is 220 lb (99.8 kg). His temporal temperature is 97.2 F (36.2 C) (abnormal). His blood pressure is 144/83 (abnormal) and his pulse is 71. His respiration is 16 and oxygen saturation is 94%.    Reason for encounter: "interventional pain management therapy due pain of at least four (4) weeks in duration, with failure to respond and/or inability to tolerate more conservative care.  Site Confirmation: Henry [name] was asked to confirm the procedure and laterality before marking the site.  Consent: Before the procedure and under the influence of no sedative(s), amnesic(s), or anxiolytics, the patient was informed of the treatment options, risks and possible complications. To fulfill our ethical and legal obligations, as recommended by the American Medical Association's Code of Ethics, I have informed the patient of my clinical impression; the nature and purpose of the treatment or procedure; the risks, benefits, and possible complications of the intervention; the alternatives, including doing nothing; the risk(s) and benefit(s) of the alternative treatment(s) or procedure(s); and the risk(s) and benefit(s) of doing nothing. The patient was provided information about the general risks and possible complications associated with the procedure. These may include, but are not limited to: failure to achieve desired goals, infection, bleeding, organ or nerve damage, allergic reactions, paralysis, and death. In addition, the patient was informed of those risks and complications associated to Spine-related procedures, such as failure to decrease pain; infection (i.e.: Meningitis, epidural or intraspinal abscess); bleeding (i.e.: epidural hematoma, subarachnoid hemorrhage, or any other type of intraspinal or peri-dural bleeding); organ or nerve damage (i.e.: Any type of peripheral nerve, nerve root, or spinal cord injury) with subsequent damage to sensory, motor, and/or autonomic systems, resulting in permanent pain, numbness, and/or weakness of one or several areas of the body; allergic reactions; (i.e.: anaphylactic reaction); and/or death. Furthermore, the patient was informed of those risks and  complications associated with the medications. These include, but are not limited to: allergic reactions (i.e.: anaphylactic or anaphylactoid reaction(s)); adrenal axis suppression; blood sugar elevation that in diabetics may result in ketoacidosis or comma; water retention that in patients with history of congestive heart failure may result in shortness of breath, pulmonary edema, and decompensation with resultant heart failure; weight gain; swelling or edema; medication-induced neural toxicity; particulate matter  embolism and blood vessel occlusion with resultant organ, and/or nervous system infarction; and/or aseptic necrosis of one or more joints. Finally, the patient was informed that Medicine is not an exact science; therefore, there is also the possibility of unforeseen or unpredictable risks and/or possible complications that may result in a catastrophic outcome. The patient indicated having understood very clearly. We have given the patient no guarantees and we have made no promises. Enough time was given to the patient to ask questions, all of which were answered to the patient's satisfaction. Henry [name] has indicated that he wanted to continue with the procedure. Attestation: I, the ordering provider, attest that I have discussed with the patient the benefits, risks, side-effects, alternatives, likelihood of achieving goals, and potential problems during recovery for the procedure that I have provided informed consent.  Date  Time: 06/25/2022 12:48 PM  Description of procedure  Start Time: 1306 hrs  Local Anesthesia: Once the patient was positioned, prepped, and time-out was completed. The target area was identified located. The skin was marked with an approved surgical skin marker. Once marked, the skin (epidermis, dermis, and hypodermis), and deeper tissues (fat, connective tissue and muscle) were infiltrated with a small amount of a short-acting local anesthetic, loaded on a 10cc syringe  with a 25G, 1.5-in  Needle. An appropriate amount of time was allowed for local anesthetics to take effect before proceeding to the next step. Local Anesthetic: Lidocaine 1-2% The unused portion of the local anesthetic was discarded in the proper designated containers. Safety Precautions: Aspiration looking for blood return was conducted prior to all injections. At no point did I inject any substances, as a needle was being advanced. Before injecting, the patient was told to immediately notify me if he was experiencing any new onset of "ringing in the ears, or metallic taste in the mouth". No attempts were made at seeking any paresthesias. Safe injection practices and needle disposal techniques used. Medications properly checked for expiration dates. SDV (single dose vial) medications used. After the completion of the procedure, all disposable equipment used was discarded in the proper designated medical waste containers.  Technical description: Protocol guidelines were followed. After positioning, the target area was identified and prepped in the usual manner. Skin & deeper tissues infiltrated with local anesthetic. Appropriate amount of time allowed to pass for local anesthetics to take effect. Proper needle placement secured. Once satisfactory needle placement was confirmed, I proceeded to inject the desired solution in slow, incremental fashion, intermittently assessing for discomfort or any signs of abnormal or undesired spread of substance. Once completed, the needle was removed and disposed of, as per hospital protocols. The area was cleaned, making sure to leave some of the prepping solution back to take advantage of its long term bactericidal properties.  Aspiration:  Negative        Afterwards procedure 2 was completed.  This was a left ring trigger finger injection.  2 cc solution containing 1 cc of 0.2% ropivacaine, 1 cc of Decadron 10 mg/cc was injected into the left ring trigger  finger.  Vitals:   06/25/22 1256  BP: (!) 144/83  Pulse: 71  Resp: 16  Temp: (!) 97.2 F (36.2 C)  TempSrc: Temporal  SpO2: 94%  Weight: 220 lb (99.8 kg)  Height: 5\' 11"  (1.803 m)    End Time: 1318 hrs   Post-op assessment  Post-procedure Vital Signs:  Pulse/HCG Rate: 71  Temp: (!) 97.2 F (36.2 C) Resp: 16 BP: (!) 144/83 SpO2: 94 %  EBL: None  Complications: No immediate post-treatment complications observed by team, or reported by patient.  Note: The patient tolerated the entire procedure well. A repeat set of vitals were taken after the procedure and the patient was kept under observation following institutional policy, for this type of procedure. Post-procedural neurological assessment was performed, showing return to baseline, prior to discharge. The patient was provided with post-procedure discharge instructions, including a section on how to identify potential problems. Should any problems arise concerning this procedure, the patient was given instructions to immediately contact us, at any time, without hesitation. In any case, we plan to contact the patient by telephone for a follow-up status report regarding this interventional procedure.  Comments:  No additional relevant information.  Plan of care   Medications administered: We administered sodium hyaluronate (viscosup), sodium hyaluronate (viscosup), and dexamethasone.  Follow-up after May for Qutenza and postprocedural evaluation.  Follow-up plan:   Return in about 7 weeks (around 08/13/2022) for Qutenza, postprocedural evaluation.     Recent Visits Date Type Provider Dept  06/12/22 Office Visit Gillis Santa, MD [facility]  05/13/22 Procedure visit Gillis Santa, MD [facility]  04/23/22 Office Visit Gillis Santa, MD [facility]  Showing recent visits within past 90 days and meeting all other requirements Today's Visits Date Type Provider Dept  06/25/22 Procedure visit  Gillis Santa, MD [facility]  Showing today's visits and meeting all other requirements Future Appointments Date Type Provider Dept  08/21/22 Appointment Gillis Santa, MD [facility]  Showing future appointments within next 90 days and meeting all other requirements   Disposition: Discharge home  Discharge (Date  Time): 06/25/2022; 1325 hrs.   Primary Care Physician: Juluis Pitch, MD Location: [facility] Note by: Gillis Santa, MD Date: 06/25/2022; Time: 2:13 PM  DISCLAIMER: Medicine is not an exact science. It has no guarantees or warranties. The decision to proceed with this intervention was based on the information collected from the patient. Conclusions were drawn from the patient's questionnaire, interview, and examination. Because information was provided in large part by the patient, it cannot be guaranteed that it has not been purposely or unconsciously manipulated or altered. Every effort has been made to obtain as much accurate, relevant, available data as possible. Always take into account that the treatment will also be dependent on availability of resources and existing treatment guidelines, considered by other Pain Management Specialists as being common knowledge and practice, at the time of the intervention. It is also important to point out that variation in procedural techniques and pharmacological choices are the acceptable norm. For Medico-Legal review purposes, the indications, contraindications, technique, and results of the these procedures should only be evaluated, judged and interpreted by a Board-Certified Interventional Pain Specialist with extensive familiarity and expertise in the same exact procedure and technique.

## 2022-06-26 ENCOUNTER — Ambulatory Visit: Payer: Medicare HMO | Admitting: Student in an Organized Health Care Education/Training Program

## 2022-06-26 ENCOUNTER — Telehealth: Payer: Self-pay

## 2022-06-26 NOTE — Telephone Encounter (Signed)
He had an injection yesterday and his blood sugar is up so he wants to know what to do. He would like for a nurse to call him back.

## 2022-06-27 ENCOUNTER — Telehealth: Payer: Self-pay

## 2022-06-27 NOTE — Telephone Encounter (Signed)
Patient states his blood glucose is back to normal this morning. Informed him that glucose levels may be elevated up to 2 weeks following steroidal injection. Advised him to call physician who manages his diabetes if blood glucose is out of control.

## 2022-06-27 NOTE — Telephone Encounter (Signed)
Attempted to call patient, message left. 

## 2022-06-27 NOTE — Telephone Encounter (Signed)
Attempted to call patient again.  The phone rang and rang and never went to voicemail.

## 2022-07-10 ENCOUNTER — Telehealth: Payer: Self-pay

## 2022-07-10 ENCOUNTER — Other Ambulatory Visit: Payer: Self-pay | Admitting: Urology

## 2022-07-10 ENCOUNTER — Ambulatory Visit: Payer: Medicare HMO | Admitting: Urology

## 2022-07-10 VITALS — BP 154/72 | HR 98 | Ht 71.0 in | Wt 220.0 lb

## 2022-07-10 DIAGNOSIS — N35013 Post-traumatic anterior urethral stricture: Secondary | ICD-10-CM

## 2022-07-10 DIAGNOSIS — R3129 Other microscopic hematuria: Secondary | ICD-10-CM

## 2022-07-10 DIAGNOSIS — Z538 Procedure and treatment not carried out for other reasons: Secondary | ICD-10-CM

## 2022-07-10 DIAGNOSIS — N400 Enlarged prostate without lower urinary tract symptoms: Secondary | ICD-10-CM | POA: Diagnosis not present

## 2022-07-10 DIAGNOSIS — Z01818 Encounter for other preprocedural examination: Secondary | ICD-10-CM | POA: Diagnosis not present

## 2022-07-10 LAB — URINALYSIS, COMPLETE
Bilirubin, UA: NEGATIVE
Glucose, UA: NEGATIVE
Nitrite, UA: NEGATIVE
Specific Gravity, UA: 1.01 (ref 1.005–1.030)
Urobilinogen, Ur: 0.2 mg/dL (ref 0.2–1.0)
pH, UA: 6 (ref 5.0–7.5)

## 2022-07-10 LAB — MICROSCOPIC EXAMINATION

## 2022-07-10 NOTE — Progress Notes (Signed)
   07/10/22  CC:  Chief Complaint  Patient presents with   Cysto    HPI: 77 year old male with high risk hematuria who presents today for cystoscopy.  Please see previous notes for details.  Blood pressure (!) 154/72, pulse 98, height 5\' 11"  (1.803 m), weight 220 lb (99.8 kg). NED. A&Ox3.   No respiratory distress   Abd soft, NT, ND Normal phallus with bilateral descended testicles  Cystoscopy Procedure Note  Patient identification was confirmed, informed consent was obtained, and patient was prepped using Betadine solution.  Lidocaine jelly was administered per urethral meatus.     Pre-Procedure: - Inspection reveals a normal caliber ureteral meatus.  Procedure: The flexible cystoscope was introduced without difficulty -Able to advance the scope to the mid pendulous her urethra where a dense approximately 8 French bulbar urethral stricture was identified, unable to traverse the scope.  At this point time, the procedure was aborted.   Post-Procedure: - Patient tolerated the procedure well  Assessment/ Plan:  1. Post-traumatic stricture of anterior urethra Unable to complete cystoscopy today due to presence of bulbar urethral stricture  He does have baseline urinary symptoms of which the urethral stricture may be a contributing factor  In the setting of high risk microscopic hematuria, inability to visualize the bladder secondary to the stricture, recommended proceeding to the operating room for urethral dilation with Optilume balloon, cystoscopy and bilateral retrograde to complete his microscopic hematuria workup.  Will need to have a Foley catheter 24 to 48 hours postop.  Additional risk including recurrent urethral stricture, bleeding, infection, damage surrounding structures amongst others were discussed.  Given that he is on oxygen, we may build to do this under MAC.  Will discuss with radiologist.  He is agreeable to plan.  Preoperative urine sent today. -  Urinalysis, Complete  2. Microscopic hematuria As above  3. Pre-op testing As above - CULTURE, URINE COMPREHENSIVE   Hollice Espy, MD

## 2022-07-10 NOTE — Telephone Encounter (Signed)
I spoke with Henry [name]. We have discussed possible surgery dates and Monday April 29th, 2024  was agreed upon by all parties. Patient given information about surgery date, what to expect pre-operatively and post operatively.  We discussed that a Pre-Admission Testing office will be calling to set up the pre-op visit that will take place prior to surgery, and that these appointments are typically done over the phone with a Pre-Admissions RN. Informed patient that our office will communicate any additional care to be provided after surgery. Patients questions or concerns were discussed during our call. Advised to call our office should there be any additional information, questions or concerns that arise. Patient verbalized understanding.

## 2022-07-10 NOTE — Progress Notes (Signed)
   [facility] Posting Form  Surgery Date: Date: 08/05/2022  Surgeon: Dr. Hollice Espy, MD  Inpt ( No  )   Outpt (Yes)   Obs ( No  )   Diagnosis: N35.013 Urethral Stricture, R31.29 Microscopic Hematuria  -CPT: 52281, 52000, 74420  Surgery: Urethral Balloon Dilation using Optilume, Cystoscopy with Bilateral Retrograde Pyelograms  Stop Anticoagulations: Yes, may continue ASA  Cardiac/Medical/Pulmonary Clearance needed: no  *Orders entered into EPIC  Date: 07/10/22   *Case booked in EPIC  Date: 07/10/22  *Notified pt of Surgery: Date: 07/10/22  PRE-OP UA & CX: no  *Placed into Prior Authorization Work [place] Date: 07/10/22  Assistant/laser/rep:No

## 2022-07-10 NOTE — H&P (View-Only) (Signed)
   07/10/22  CC:  Chief Complaint  Patient presents with   Cysto    HPI: 77-year-old male with high risk hematuria who presents today for cystoscopy.  Please see previous notes for details.  Blood pressure (!) 154/72, pulse 98, height 5' 11" (1.803 m), weight 220 lb (99.8 kg). NED. A&Ox3.   No respiratory distress   Abd soft, NT, ND Normal phallus with bilateral descended testicles  Cystoscopy Procedure Note  Patient identification was confirmed, informed consent was obtained, and patient was prepped using Betadine solution.  Lidocaine jelly was administered per urethral meatus.     Pre-Procedure: - Inspection reveals a normal caliber ureteral meatus.  Procedure: The flexible cystoscope was introduced without difficulty -Able to advance the scope to the mid pendulous her urethra where a dense approximately 8 French bulbar urethral stricture was identified, unable to traverse the scope.  At this point time, the procedure was aborted.   Post-Procedure: - Patient tolerated the procedure well  Assessment/ Plan:  1. Post-traumatic stricture of anterior urethra Unable to complete cystoscopy today due to presence of bulbar urethral stricture  He does have baseline urinary symptoms of which the urethral stricture may be a contributing factor  In the setting of high risk microscopic hematuria, inability to visualize the bladder secondary to the stricture, recommended proceeding to the operating room for urethral dilation with Optilume balloon, cystoscopy and bilateral retrograde to complete his microscopic hematuria workup.  Will need to have a Foley catheter 24 to 48 hours postop.  Additional risk including recurrent urethral stricture, bleeding, infection, damage surrounding structures amongst others were discussed.  Given that he is on oxygen, we may build to do this under MAC.  Will discuss with radiologist.  He is agreeable to plan.  Preoperative urine sent today. -  Urinalysis, Complete  2. Microscopic hematuria As above  3. Pre-op testing As above - CULTURE, URINE COMPREHENSIVE   Vallarie Fei, MD 

## 2022-07-10 NOTE — Progress Notes (Signed)
Surgical Physician Order Form [facility] [place]  * Scheduling expectation : Next Available  *Length of Case:   *Clearance needed: no  *Anticoagulation Instructions: Hold all anticoagulants  *Aspirin Instructions: Ok to continue Aspirin  *Post-op visit Date/Instructions:   1-2 days post op for foley removal, otherwise f/u as scheduled in May  *Diagnosis:  Anterior pendulous urethral stricture, microscopic hematuria  *Procedure: Urethral dilation with Optilume balloon, cystoscopy, bilateral retrograde pyelogram   Additional orders: N/A  -Admit type: OUTpatient  -Anesthesia: MAC  -VTE Prophylaxis Standing Order SCD's       Other:   -Standing Lab Orders Per Anesthesia    Lab other: None  -Standing Test orders EKG/Chest x-ray per Anesthesia       Test other:   - Medications:  Ancef 2gm IV  -Other orders:  N/A

## 2022-07-13 LAB — CULTURE, URINE COMPREHENSIVE

## 2022-07-15 ENCOUNTER — Telehealth: Payer: Self-pay | Admitting: Urology

## 2022-07-15 NOTE — Telephone Encounter (Signed)
Pt called [facility] to return his call, he can not get his surgery instructions via mychart

## 2022-07-17 DIAGNOSIS — G63 Polyneuropathy in diseases classified elsewhere: Secondary | ICD-10-CM | POA: Diagnosis not present

## 2022-07-17 DIAGNOSIS — M79672 Pain in left foot: Secondary | ICD-10-CM | POA: Diagnosis not present

## 2022-07-17 DIAGNOSIS — M79642 Pain in left hand: Secondary | ICD-10-CM | POA: Diagnosis not present

## 2022-07-17 DIAGNOSIS — G5602 Carpal tunnel syndrome, left upper limb: Secondary | ICD-10-CM | POA: Diagnosis not present

## 2022-07-17 DIAGNOSIS — M79641 Pain in right hand: Secondary | ICD-10-CM | POA: Diagnosis not present

## 2022-07-17 DIAGNOSIS — M79671 Pain in right foot: Secondary | ICD-10-CM | POA: Diagnosis not present

## 2022-07-29 ENCOUNTER — Encounter
Admission: RE | Admit: 2022-07-29 | Discharge: 2022-07-29 | Disposition: A | Discharge status: Home / Self Care | Payer: Medicare HMO | Source: Ambulatory Visit | Attending: Urology | Admitting: Urology

## 2022-07-29 ENCOUNTER — Other Ambulatory Visit: Payer: Self-pay

## 2022-07-29 VITALS — Ht 71.0 in | Wt 225.0 lb

## 2022-07-29 DIAGNOSIS — I214 Non-ST elevation (NSTEMI) myocardial infarction: Secondary | ICD-10-CM

## 2022-07-29 DIAGNOSIS — I200 Unstable angina: Secondary | ICD-10-CM

## 2022-07-29 DIAGNOSIS — E785 Hyperlipidemia, unspecified: Secondary | ICD-10-CM

## 2022-07-29 DIAGNOSIS — I2510 Atherosclerotic heart disease of native coronary artery without angina pectoris: Secondary | ICD-10-CM

## 2022-07-29 DIAGNOSIS — I10 Essential (primary) hypertension: Secondary | ICD-10-CM

## 2022-07-29 DIAGNOSIS — Z95820 Peripheral vascular angioplasty status with implants and grafts: Secondary | ICD-10-CM

## 2022-07-29 DIAGNOSIS — E1122 Type 2 diabetes mellitus with diabetic chronic kidney disease: Secondary | ICD-10-CM

## 2022-07-29 DIAGNOSIS — N1831 Chronic kidney disease, stage 3a: Secondary | ICD-10-CM

## 2022-07-29 NOTE — Patient Instructions (Addendum)
Your procedure is scheduled on: Monday 08/05/22 To find out your arrival time, please call [phone] between 1PM - 3PM on:   Friday 08/02/22 Report to the Registration Desk on the 1st floor of the [facility]. Valet parking is available.  If your arrival time is 6:00 am, do not arrive before that time as the [facility] entrance doors do not open until 6:00 am.  REMEMBER: Instructions that are not followed completely may result in serious medical risk, up to and including death; or upon the discretion of your surgeon and anesthesiologist your surgery may need to be rescheduled.  Do not eat food or drink any liquids after midnight the night before surgery.  No gum chewing or hard candies.  One week prior to surgery: Stop Anti-inflammatories (NSAIDS) such as Advil, Aleve, Ibuprofen, Motrin, Naproxen, Naprosyn and  Excedrin, Goody's Powder, BC Powder. You may however, continue to take Tylenol if needed for pain up until the day of surgery.  Stop ANY OVER THE COUNTER supplements or vitamins until after surgery.  Continue taking all prescribed medications with the exception of the following: Hold Janumet for 2 days (last dose 4/26) and Jardiance for 3 days (last dose 4/25), restart after procedure. Hold Aspirin day of procedure 4/29.  TAKE ONLY THESE MEDICATIONS THE MORNING OF SURGERY WITH A SIP OF WATER:  cetirizine (ZYRTEC) 10 MG tablet  DULoxetine (CYMBALTA) 30 MG capsule  gabapentin (NEURONTIN) 600 MG tablet  metoprolol tartrate (LOPRESSOR) 25 MG tablet  tamsulosin (FLOMAX) 0.4 MG CAPS capsule  HYDROcodone-acetaminophen (NORCO/VICODIN) 5-325 MG tablet if needed  Use inhalers on the day of surgery and bring to the hospital.  No Alcohol for 24 hours before or after surgery.  No Smoking including e-cigarettes for 24 hours before surgery.  No chewable tobacco products for at least 6 hours before surgery.  No nicotine patches on the day of surgery.  Do not use any "recreational"  drugs for at least a week (preferably 2 weeks) before your surgery.  Please be advised that the combination of cocaine and anesthesia may have negative outcomes, up to and including death. If you test positive for cocaine, your surgery will be cancelled.  On the morning of surgery brush your teeth with toothpaste and water, you may rinse your mouth with mouthwash if you wish. Do not swallow any toothpaste or mouthwash.  Use CHG Soap or wipes as directed on instruction sheet. Shower the morning of your procedure with your regular soap.  Do not wear lotions, powders, or perfumes.   Do not shave body hair from the neck down 48 hours before surgery.  Wear comfortable clothing (specific to your surgery type) to the hospital.  Do not wear jewelry, make-up, hairpins, clips or nail polish.  Contact lenses, hearing aids and dentures may not be worn into surgery.  Do not bring valuables to the hospital. [facility] is not responsible for any missing/lost belongings or valuables.   Notify your doctor if there is any change in your medical condition (cold, fever, infection).  If you are being discharged the day of surgery, you will not be allowed to drive home. You will need a responsible individual to drive you home and stay with you for 24 hours after surgery.   If you are taking public transportation, you will need to have a responsible individual with you.  If you are being admitted to the hospital overnight, leave your suitcase in the car. After surgery it may be brought to your room.  In case of increased patient census, it may be necessary for you, the patient, to continue your postoperative care in the Same Day Surgery department.  After surgery, you can help prevent lung complications by doing breathing exercises.  Take deep breaths and cough every 1-2 hours. Your doctor may order a device called an Incentive Spirometer to help you take deep breaths. When coughing or sneezing, hold a  pillow firmly against your incision with both hands. This is called "splinting." Doing this helps protect your incision. It also decreases belly discomfort.  Surgery Visitation Policy:  Patients undergoing a surgery or procedure may have two family members or support persons with them as long as the person is not COVID-19 positive or experiencing its symptoms.   Inpatient Visitation:    Visiting hours are 7 a.m. to 8 p.m. Up to four visitors are allowed at one time in a patient room. The visitors may rotate out with other people during the day. One designated support person (adult) may remain overnight.  Please call the Pre-admissions Testing Dept. at [phone] if you have any questions about these instructions.

## 2022-07-30 ENCOUNTER — Encounter: Payer: Self-pay | Admitting: Urgent Care

## 2022-07-30 ENCOUNTER — Encounter
Admission: RE | Admit: 2022-07-30 | Discharge: 2022-07-30 | Disposition: A | Discharge status: Home / Self Care | Payer: Medicare HMO | Source: Ambulatory Visit | Attending: Urology | Admitting: Urology

## 2022-07-30 DIAGNOSIS — Z01818 Encounter for other preprocedural examination: Secondary | ICD-10-CM | POA: Insufficient documentation

## 2022-07-30 DIAGNOSIS — I2510 Atherosclerotic heart disease of native coronary artery without angina pectoris: Secondary | ICD-10-CM

## 2022-07-30 DIAGNOSIS — I214 Non-ST elevation (NSTEMI) myocardial infarction: Secondary | ICD-10-CM

## 2022-07-30 DIAGNOSIS — I200 Unstable angina: Secondary | ICD-10-CM

## 2022-07-30 DIAGNOSIS — I10 Essential (primary) hypertension: Secondary | ICD-10-CM

## 2022-07-30 DIAGNOSIS — N1831 Chronic kidney disease, stage 3a: Secondary | ICD-10-CM | POA: Diagnosis not present

## 2022-07-30 DIAGNOSIS — E785 Hyperlipidemia, unspecified: Secondary | ICD-10-CM | POA: Diagnosis not present

## 2022-07-30 DIAGNOSIS — I1310 Hypertensive heart and chronic kidney disease without heart failure, with stage 1 through stage 4 chronic kidney disease, or unspecified chronic kidney disease: Secondary | ICD-10-CM | POA: Insufficient documentation

## 2022-07-30 DIAGNOSIS — Z794 Long term (current) use of insulin: Secondary | ICD-10-CM | POA: Diagnosis not present

## 2022-07-30 DIAGNOSIS — Z01810 Encounter for preprocedural cardiovascular examination: Secondary | ICD-10-CM | POA: Diagnosis not present

## 2022-07-30 DIAGNOSIS — Z95820 Peripheral vascular angioplasty status with implants and grafts: Secondary | ICD-10-CM | POA: Diagnosis not present

## 2022-07-30 DIAGNOSIS — E1122 Type 2 diabetes mellitus with diabetic chronic kidney disease: Secondary | ICD-10-CM | POA: Diagnosis not present

## 2022-07-30 DIAGNOSIS — I252 Old myocardial infarction: Secondary | ICD-10-CM | POA: Diagnosis not present

## 2022-07-30 LAB — BASIC METABOLIC PANEL
Anion gap: 10 (ref 5–15)
BUN: 26 mg/dL — ABNORMAL HIGH (ref 8–23)
CO2: 30 mmol/L (ref 22–32)
Calcium: 10.1 mg/dL (ref 8.9–10.3)
Chloride: 98 mmol/L (ref 98–111)
Creatinine, Ser: 1.29 mg/dL — ABNORMAL HIGH (ref 0.61–1.24)
GFR, Estimated: 57 mL/min — ABNORMAL LOW (ref >60)
Glucose, Bld: 160 mg/dL — ABNORMAL HIGH (ref 70–99)
Potassium: 4.4 mmol/L (ref 3.5–5.1)
Sodium: 138 mmol/L (ref 135–145)

## 2022-07-30 LAB — CBC
HCT: 49.6 % (ref 39.0–52.0)
Hemoglobin: 15.4 g/dL (ref 13.0–17.0)
MCH: 27.8 pg (ref 26.0–34.0)
MCHC: 31 g/dL (ref 30.0–36.0)
MCV: 89.5 fL (ref 80.0–100.0)
Platelets: 286 10*3/uL (ref 150–400)
RBC: 5.54 MIL/uL (ref 4.22–5.81)
RDW: 14.6 % (ref 11.5–15.5)
WBC: 10.1 10*3/uL (ref 4.0–10.5)
nRBC: 0 % (ref 0.0–0.2)

## 2022-08-01 ENCOUNTER — Encounter: Payer: Self-pay | Admitting: Urology

## 2022-08-01 NOTE — Progress Notes (Addendum)
Perioperative / Anesthesia Services  Pre-Admission Testing Clinical Review / Preoperative Anesthesia Consult  Date: 08/01/22  Patient Demographics:  Name: Henry [name] DOB:   1945-08-19 MRN:   [id number]  Planned Surgical Procedure(s):    Case: [id number] Date/Time: 08/05/22 0857   Procedures:      CYSTOSCOPY WITH URETHRAL BALLOON  DILATATION WITH OPTILUME     CYSTOSCOPY WITH RETROGRADE PYELOGRAM (Bilateral)   Anesthesia type: Monitor Anesthesia Care   Pre-op diagnosis: Urethral Stricture, Microscopic Hematuria   Location: [facility] OR ROOM 10 / [facility] ORS FOR ANESTHESIA GROUP   Surgeons: [name], MD     NOTE: Available PAT nursing documentation and vital signs have been reviewed. Clinical nursing staff has updated patient's PMH/PSHx, current medication list, and drug allergies/intolerances to ensure comprehensive history available to assist in medical decision making as it pertains to the aforementioned surgical procedure and anticipated anesthetic course. Extensive review of available clinical information personally performed. [place] [facility] and PSHx updated with any diagnoses/procedures that  may have been inadvertently omitted during his intake with the pre-admission testing department's nursing staff.  Clinical Discussion:  Henry [name] is a 77 y.o. male who is submitted for pre-surgical anesthesia review and clearance prior to him undergoing the above procedure. Patient is a Former Smoker (50 pack years; quit 08/2017). Pertinent PMH includes: CAD, STEMI, ischemic cardiomyopathy, angina, HTN, HLD, T2DM, CKD-II, COPD, nephrolithiasis, urethral stricture, OA, diabetic polyneuropathy, chronic opioid therapy.  Patient is followed by cardiology Darrold Junker, MD). He was last seen in the cardiology clinic on 02/19/2022; notes reviewed. At the time of his clinic visit, patient doing well overall from a cardiovascular perspective.  Patient with chronic exertional dyspnea related to  a known COPD diagnosis; followed by pulmonary medicine. Patient denied any chest pain,  PND, orthopnea, palpitations, significant peripheral edema, weakness, fatigue, vertiginous symptoms, or presyncope/syncope. Patient with a past medical history significant for cardiovascular diagnoses. Documented physical exam was grossly benign, providing no evidence of acute exacerbation and/or decompensation of the patient's known cardiovascular conditions.  Patient suffered an acute anterior wall STEMI on 08/10/2017.  Diagnostic LEFT heart catheterization revealed multivessel CAD; 50% proximal RCA-1, 99% proximal RCA-2, 30% distal RCA, 30% proximal to mid LCx, 70% OM1, 70% proximal LAD, and 99% proximal to mid LAD.  Of note, there was a significant thrombus burden noted within the proximal to mid LAD.  PCI was subsequently performed placing a 2.5 x 12 mm Sierra DES x 1 to the proximal LAD and a 2.5 x 28 mm Sierra DES x 1 to the proximal to mid LAD.  Procedure yielded excellent angiographic result and TIMI-3 flow.  Plans were for stabilization and staged orbital atherectomy.  Patient underwent staged orbital atherectomy procedure on 08/13/2017.  Additionally, PCI was performed placing a 3.5 x 24 mm resolute Onyx DES x 1 to the proximal RCA.  Procedure yielded excellent angiographic result and TIMI-3 flow.  Following STEMI, patient with an ischemic cardiomyopathy diagnosis.  Since time of diagnosis, cardiac function has been serially monitored.  Last TTE was performed on 08/02/2020 revealing a normal left ventricular systolic function with an EF of 55-60%.  There were no regional wall motion abnormalities.  Right ventricular size and function normal.  There was trivial mitral valve regurgitation.  All transvalvular gradients were noted.  Normal providing no evidence suggestive of valvular stenosis.  Most recent myocardial perfusion imaging study was performed on 08/02/2020 revealing a normal left ventricular systolic  function with an EF of 55-65%.  There  was no evidence of stress-induced myocardial ischemia or arrhythmia; no scintigraphic evidence of scar.  Study determined to be normal and low risk.  Blood pressure well controlled at 108/68 mmHg on currently prescribed diuretic (furosemide), ARB (irbesartan), and beta-blocker (metoprolol tartrate) therapies.  Patient is on atorvastatin for his HLD diagnosis and ASCVD prevention. T2DM reported to be well controlled on currently prescribed regimen; last HgbA1c on file for review was 7.9% when checked on 02/11/2021.. In the setting of cardiovascular and concurrent T2DM diagnoses, patient is on an SGLT2i (empagliflozin) for both added cardiovascular and renovascular protection. He does not have an OSAH diagnosis. Functional capacity, as defined by DASI, is documented as being >/= 4 METS.  No changes were made to his medication regimen.  Patient follow-up with outpatient cardiology in 6 months or sooner if needed.  Henry [name] is scheduled for an CYSTOSCOPY WITH URETHRAL BALLOON  DILATATION WITH OPTILUME; CYSTOSCOPY WITH RETROGRADE PYELOGRAM (Bilateral) on 08/05/2022 with Dr. [name], MD.  Given patient's past medical history significant for cardiovascular diagnoses, presurgical cardiac clearance was sought by the PAT team. Per cardiology, "this patient is optimized for surgery and may proceed with the planned procedural course with a LOW risk of significant perioperative cardiovascular complications".  In review of his medication reconciliation, it is noted that patient is currently on prescribed daily antithrombotic therapy. He has been instructed on recommendations from his primary attending surgeon for continuing his daily low-dose ASA throughout his perioperative course.  Patient denies previous perioperative complications with anesthesia in the past. In review of the available records, it is noted that patient underwent a general anesthetic course here at  [facility] (ASA III) in 03/2020 without documented complications.      07/29/2022   12:58 PM 07/10/2022   10:03 AM 06/25/2022   12:56 PM  Vitals with BMI  Height     Weight 225 lbs 220 lbs 220 lbs  BMI 31.39 30.7 30.7  Systolic  154 144  Diastolic  72 83  Pulse  98 71    Providers/Specialists:   NOTE: Primary physician provider listed below. Patient may have been seen by APP or partner within same practice.   PROVIDER ROLE / SPECIALTY LAST Jaquelyn Bitter, MD Urology (Surgeon) 07/10/2022  Dorothey Baseman, MD Primary Care Provider 04/05/2022  Marcina Millard, MD Cardiology 02/19/2022  Edward Jolly, MD Pain Management 06/25/2022  Theora Master, MD Neurology 07/17/2022   Allergies:  Lisinopril  Current Home Medications:   No current facility-administered medications for this encounter.    aspirin EC 81 MG EC tablet   atorvastatin (LIPITOR) 80 MG tablet   cetirizine (ZYRTEC) 10 MG tablet   diclofenac Sodium (VOLTAREN) 1 % GEL   DULoxetine (CYMBALTA) 30 MG capsule   empagliflozin (JARDIANCE) 25 MG TABS tablet   fluticasone (FLONASE) 50 MCG/ACT nasal spray   furosemide (LASIX) 20 MG tablet   gabapentin (NEURONTIN) 300 MG capsule   gabapentin (NEURONTIN) 600 MG tablet   HYDROcodone-acetaminophen (NORCO/VICODIN) 5-325 MG tablet   insulin glargine-yfgn (SEMGLEE) 100 UNIT/ML injection   Ipratropium-Albuterol (COMBIVENT IN)   irbesartan (AVAPRO) 150 MG tablet   metoprolol tartrate (LOPRESSOR) 25 MG tablet   montelukast (SINGULAIR) 10 MG tablet   Multiple Vitamin (MULTIVITAMIN WITH MINERALS) TABS tablet   nitroGLYCERIN (NITROSTAT) 0.4 MG SL tablet   sitaGLIPtin-metformin (JANUMET) 50-1000 MG tablet   tamsulosin (FLOMAX) 0.4 MG CAPS capsule   History:   Past Medical History:  Diagnosis Date  Acute ST elevation myocardial infarction (STEMI) of anterior wall    a.) LHC/PCI 08/10/2017: 99% thrombotic occlusion  p-mLAD (2.5 x 28 mm Sierra DES), 70% pLAD (2.5 x 12 mm Sierra DES); b.) s/p staged orbital athrectomy and PCI pRCA (3.5 x 24 mm Resolute Onyx) 08/13/2017   Anginal pain    Arthritis    CAD (coronary artery disease)    a.) LHC/PCI 08/10/2017: 50%/99% pRCA, 30% dRCA, 30% p-mLCx, 70% OM1, 99% thrombotic occlusion p-mLAD (2.5 x 28 mm Sierra DES), 70% pLAD (2.5 x 12 mm Sierra DES); b.) s/p staged orbital athrectomy and PCI pRCA (3.5 x 24 mm Resolute Onyx) 08/13/2017; c.) MPI 03/27/2020: mild inferior ischemic; d.) MPI 08/02/2020: no ischemia   Chronic, continuous use of opioids    CKD (chronic kidney disease), stage II    COPD (chronic obstructive pulmonary disease)    Diabetic polyneuropathy    Glaucoma    History of kidney stones    Hyperlipidemia    Hypertension    Ischemic cardiomyopathy    a.) TTE 08/10/17: technically difficult study, no diagnostic RWMA, EF 40-50%, poor windows, very difficult study; b.) TTE 12/25/2019: EF 60-65%, RAE, G1DD; c.) TTE 07/28/2020: EF 55-60%, triv TR   Skin cancer    Type 2 diabetes mellitus treated with insulin    Urethral stricture    Past Surgical History:  Procedure Laterality Date   CARDIAC CATHETERIZATION     COLONOSCOPY WITH PROPOFOL N/A 12/08/2014   Procedure: COLONOSCOPY WITH PROPOFOL;  Surgeon: Scot Jun, MD;  Location: [facility] ENDOSCOPY;  Service: Endoscopy;  Laterality: N/A;   COLONOSCOPY WITH PROPOFOL N/A 01/10/2016   Procedure: COLONOSCOPY WITH PROPOFOL;  Surgeon: Scot Jun, MD;  Location: [facility] ENDOSCOPY;  Service: Endoscopy;  Laterality: N/A;   COLONOSCOPY WITH PROPOFOL N/A 04/28/2019   Procedure: COLONOSCOPY WITH PROPOFOL;  Surgeon: Earline Mayotte, MD;  Location: [facility] ENDOSCOPY;  Service: Endoscopy;  Laterality: N/A;   COLONOSCOPY WITH PROPOFOL N/A 03/17/2020   Procedure: COLONOSCOPY WITH PROPOFOL;  Surgeon: Earline Mayotte, MD;  Location: [facility] ENDOSCOPY;  Service: Endoscopy;  Laterality: N/A;   CORONARY ATHERECTOMY N/A 08/13/2017    Procedure: CORONARY ATHERECTOMY;  Surgeon: Iran Ouch, MD;  Location: [facility] INVASIVE CV LAB;  Service: Cardiovascular;  Laterality: N/A;   CORONARY STENT INTERVENTION N/A 08/13/2017   Procedure: CORONARY STENT INTERVENTION;  Surgeon: Iran Ouch, MD;  Location: [facility] INVASIVE CV LAB;  Service: Cardiovascular;  Laterality: N/A;   CORONARY/GRAFT ACUTE MI REVASCULARIZATION N/A 08/10/2017   Procedure: Coronary/Graft Acute MI Revascularization;  Surgeon: Kathleene Hazel, MD;  Location: [facility] INVASIVE CV LAB;  Service: Cardiovascular;  Laterality: N/A;   EXTRACORPOREAL SHOCK WAVE LITHOTRIPSY Right 11/09/2015   Procedure: EXTRACORPOREAL SHOCK WAVE LITHOTRIPSY (ESWL);  Surgeon: Orson Ape, MD;  Location: [facility] ORS;  Service: Urology;  Laterality: Right;   EXTRACORPOREAL SHOCK WAVE LITHOTRIPSY Left 10/17/2016   Procedure: EXTRACORPOREAL SHOCK WAVE LITHOTRIPSY (ESWL);  Surgeon: Orson Ape, MD;  Location: [facility] ORS;  Service: Urology;  Laterality: Left;   LEFT HEART CATH AND CORONARY ANGIOGRAPHY N/A 08/10/2017   Procedure: LEFT HEART CATH AND CORONARY ANGIOGRAPHY;  Surgeon: Kathleene Hazel, MD;  Location: [facility] INVASIVE CV LAB;  Service: Cardiovascular;  Laterality: N/A;   LITHOTRIPSY     x 6   PAROTIDECTOMY  2015   TEMPORARY PACEMAKER N/A 08/13/2017   Procedure: TEMPORARY PACEMAKER;  Surgeon: Iran Ouch, MD;  Location: [facility] INVASIVE CV LAB;  Service: Cardiovascular;  Laterality: N/A;   TONSILLECTOMY  Family History  Problem Relation Age of Onset   Heart attack Mother    Melanoma Father    Parkinsonism Father    Social History   Tobacco Use   Smoking status: Former    Packs/day: 1.00    Years: 50.00    Additional pack years: 0.00    Total pack years: 50.00    Types: Cigarettes    Quit date: 08/2017    Years since quitting: 4.9   Smokeless tobacco: Never   Tobacco comments:    Has Quit as of May 2019- reviewed relaspe and will continue to followup  Vaping Use    Vaping Use: Never used  Substance Use Topics   Alcohol use: No   Drug use: No    Pertinent Clinical Results:  LABS:   Hospital Outpatient Visit on 07/30/2022  Component Date Value Ref Range Status   Sodium 07/30/2022 138  135 - 145 mmol/L Final   Potassium 07/30/2022 4.4  3.5 - 5.1 mmol/L Final   Chloride 07/30/2022 98  98 - 111 mmol/L Final   CO2 07/30/2022 30  22 - 32 mmol/L Final   Glucose, Bld 07/30/2022 160 (H)  70 - 99 mg/dL Final   Glucose reference range applies only to samples taken after fasting for at least 8 hours.   BUN 07/30/2022 26 (H)  8 - 23 mg/dL Final   Creatinine, Ser 07/30/2022 1.29 (H)  0.61 - 1.24 mg/dL Final   Calcium 11/91/4782 10.1  8.9 - 10.3 mg/dL Final   GFR, Estimated 07/30/2022 57 (L)  >60 mL/min Final   Comment: (NOTE) Calculated using the CKD-EPI Creatinine Equation (2021)    Anion gap 07/30/2022 10  5 - 15 Final   Performed at [facility], [address] [place], [place] [address]   WBC 07/30/2022 10.1  4.0 - 10.5 K/uL Final   RBC 07/30/2022 5.54  4.22 - 5.81 MIL/uL Final   Hemoglobin 07/30/2022 15.4  13.0 - 17.0 g/dL Final   HCT 30/86/5784 49.6  39.0 - 52.0 % Final   MCV 07/30/2022 89.5  80.0 - 100.0 fL Final   MCH 07/30/2022 27.8  26.0 - 34.0 pg Final   MCHC 07/30/2022 31.0  30.0 - 36.0 g/dL Final   RDW 69/62/9528 14.6  11.5 - 15.5 % Final   Platelets 07/30/2022 286  150 - 400 K/uL Final   nRBC 07/30/2022 0.0  0.0 - 0.2 % Final   Performed at [facility], [address] [place], [place] [address]    ECG: Date: 07/30/2022 Time ECG obtained: 0856 AM Rate: 70 bpm Rhythm: normal sinus Axis (leads I and aVF): Normal Intervals: PR 170 ms. QRS 92 ms. QTc 416 ms. ST segment and T wave changes: No evidence of acute ST segment elevation or depression Comparison: Similar to previous tracing obtained on 12/20/2021   IMAGING / PROCEDURES: MYOCARDIAL PERFUSION IMAGING STUDY (LEXISCAN) performed on 08/02/2020 Normal  left ventricular systolic function with a normal LVEF of 55-60% Normal myocardial thickening and wall motion Left ventricular cavity size normal SPECT images demonstrate homogenous tracer distribution throughout the myocardium No evidence of stress-induced myocardial ischemia or arrhythmia Normal low risk study  TRANSTHORACIC ECHOCARDIOGRAM performed on 08/02/2020 Left ventricular ejection fraction, by estimation, is 55 to 60%. The left ventricle has normal function. The left ventricle has no regional wall motion abnormalities. Left ventricular diastolic parameters were normal.  Right ventricular systolic function is normal. The right ventricular size is normal.  The mitral valve is  normal in structure. Trivial mitral valve regurgitation.  The aortic valve is normal in structure. Aortic valve regurgitation is not visualized.   CORONARY ATHERECTOMY ANS CORONARY STENT INTERVENTION performed on 08/23/2017 Successful orbital atherectomy of the proximal RCA 3.5 x 34 mm Resolute Onyx DES to pRCA   LEFT HEART CATHETERIZATION AND CORONARY ANGIOGRAPHY performed on 08/10/2017 Normal left ventricular systolic function with an EF of 55-65% Normal LVEDP Multivessel CAD: 50% proximal RCA-1 99% proximal RCA-2 30% distal RCA 30% proximal to mid LCx 70% OM1 99% proximal to mid LAD 70% proximal LAD Successful PCI 2.5 x 12 mm Sierra DES x 1 to the proximal LAD 3.5 x 28 mm Sierra DES x 1 to the proximal to mid LAD Procedure yielded excellent angiographic result and TIMI-3 flow   Impression and Plan:  Earlean Shawl Hinely has been referred for pre-anesthesia review and clearance prior to him undergoing the planned anesthetic and procedural courses. Available labs, pertinent testing, and imaging results were personally reviewed by me in preparation for upcoming operative/procedural course. [facility] medical record has been updated following extensive record review and patient interview with PAT staff.    This patient has been appropriately cleared by cardiology with an overall LOW risk of significant perioperative cardiovascular complications. Based on clinical review performed today (08/01/22), barring any significant acute changes in the patient's overall condition, it is anticipated that he will be able to proceed with the planned surgical intervention. Any acute changes in clinical condition may necessitate his procedure being postponed and/or cancelled. Patient will meet with anesthesia team (MD and/or CRNA) on the day of his procedure for preoperative evaluation/assessment. Questions regarding anesthetic course will be fielded at that time.   Pre-surgical instructions were reviewed with the patient during his PAT appointment, and questions were fielded to satisfaction by PAT clinical staff. He has been instructed on which medications that he will need to hold prior to surgery, as well as the ones that have been deemed safe/appropriate to take of the day of his procedure. As part of the general education provided by PAT, patient made aware both verbally and in writing, that he would need to abstain from the use of any illegal substances during his perioperative course.  He was advised that failure to follow the provided instructions could necessitate case cancellation or result serious perioperative complications up to and including death. Patient encouraged to contact PAT and/or his surgeon's office to discuss any questions or concerns that may arise prior to surgery; verbalized understanding.   Quentin Mulling, MSN, APRN, FNP-C, CEN [facility]  Peri-operative Services Nurse Practitioner Phone: [phone] Fax: [phone] 08/01/22 12:40 PM  NOTE: This note has been prepared using Dragon dictation software. Despite my best ability to proofread, there is always the potential that unintentional transcriptional errors may still occur from this process.

## 2022-08-04 MED ORDER — CEFAZOLIN SODIUM-DEXTROSE 2-4 GM/100ML-% IV SOLN
2.0000 g | INTRAVENOUS | Status: AC
  Administered 2022-08-05: 2 g via INTRAVENOUS

## 2022-08-04 MED ORDER — ORAL CARE MOUTH RINSE: 15.0000 mL | Freq: Once | OROMUCOSAL | Status: AC

## 2022-08-04 MED ORDER — CHLORHEXIDINE GLUCONATE 0.12 % MT SOLN
15.0000 mL | Freq: Once | OROMUCOSAL | Status: AC
  Administered 2022-08-05: 15 mL via OROMUCOSAL

## 2022-08-04 MED ORDER — SODIUM CHLORIDE 0.9 % IV SOLN: INTRAVENOUS | Status: DC

## 2022-08-05 ENCOUNTER — Encounter
Admission: RE | Disposition: A | Discharge status: Home / Self Care | Payer: Self-pay | Source: Home / Self Care | Attending: Urology

## 2022-08-05 ENCOUNTER — Ambulatory Visit: Payer: Medicare HMO | Admitting: Urgent Care

## 2022-08-05 ENCOUNTER — Other Ambulatory Visit: Payer: Self-pay

## 2022-08-05 ENCOUNTER — Ambulatory Visit: Payer: Medicare HMO

## 2022-08-05 ENCOUNTER — Ambulatory Visit
Admission: RE | Admit: 2022-08-05 | Discharge: 2022-08-05 | Disposition: A | Discharge status: Home / Self Care | Payer: Medicare HMO | Attending: Urology | Admitting: Urology

## 2022-08-05 DIAGNOSIS — N35912 Unspecified bulbous urethral stricture, male: Secondary | ICD-10-CM | POA: Diagnosis not present

## 2022-08-05 DIAGNOSIS — F419 Anxiety disorder, unspecified: Secondary | ICD-10-CM | POA: Insufficient documentation

## 2022-08-05 DIAGNOSIS — Z87891 Personal history of nicotine dependence: Secondary | ICD-10-CM | POA: Insufficient documentation

## 2022-08-05 DIAGNOSIS — R319 Hematuria, unspecified: Secondary | ICD-10-CM

## 2022-08-05 DIAGNOSIS — Z6831 Body mass index (BMI) 31.0-31.9, adult: Secondary | ICD-10-CM | POA: Insufficient documentation

## 2022-08-05 DIAGNOSIS — R3129 Other microscopic hematuria: Secondary | ICD-10-CM | POA: Insufficient documentation

## 2022-08-05 DIAGNOSIS — N1831 Chronic kidney disease, stage 3a: Secondary | ICD-10-CM | POA: Diagnosis not present

## 2022-08-05 DIAGNOSIS — I129 Hypertensive chronic kidney disease with stage 1 through stage 4 chronic kidney disease, or unspecified chronic kidney disease: Secondary | ICD-10-CM | POA: Diagnosis not present

## 2022-08-05 DIAGNOSIS — I2510 Atherosclerotic heart disease of native coronary artery without angina pectoris: Secondary | ICD-10-CM | POA: Diagnosis not present

## 2022-08-05 DIAGNOSIS — E669 Obesity, unspecified: Secondary | ICD-10-CM | POA: Insufficient documentation

## 2022-08-05 DIAGNOSIS — N35919 Unspecified urethral stricture, male, unspecified site: Secondary | ICD-10-CM | POA: Diagnosis not present

## 2022-08-05 DIAGNOSIS — M1990 Unspecified osteoarthritis, unspecified site: Secondary | ICD-10-CM | POA: Insufficient documentation

## 2022-08-05 DIAGNOSIS — Z9981 Dependence on supplemental oxygen: Secondary | ICD-10-CM | POA: Diagnosis not present

## 2022-08-05 DIAGNOSIS — E1142 Type 2 diabetes mellitus with diabetic polyneuropathy: Secondary | ICD-10-CM | POA: Insufficient documentation

## 2022-08-05 DIAGNOSIS — Z87442 Personal history of urinary calculi: Secondary | ICD-10-CM | POA: Diagnosis not present

## 2022-08-05 DIAGNOSIS — I252 Old myocardial infarction: Secondary | ICD-10-CM | POA: Diagnosis not present

## 2022-08-05 DIAGNOSIS — N182 Chronic kidney disease, stage 2 (mild): Secondary | ICD-10-CM | POA: Insufficient documentation

## 2022-08-05 DIAGNOSIS — N35013 Post-traumatic anterior urethral stricture: Secondary | ICD-10-CM

## 2022-08-05 DIAGNOSIS — E1122 Type 2 diabetes mellitus with diabetic chronic kidney disease: Secondary | ICD-10-CM

## 2022-08-05 DIAGNOSIS — N35819 Other urethral stricture, male, unspecified site: Secondary | ICD-10-CM

## 2022-08-05 DIAGNOSIS — I10 Essential (primary) hypertension: Secondary | ICD-10-CM | POA: Insufficient documentation

## 2022-08-05 DIAGNOSIS — J449 Chronic obstructive pulmonary disease, unspecified: Secondary | ICD-10-CM | POA: Insufficient documentation

## 2022-08-05 HISTORY — DX: ST elevation (STEMI) myocardial infarction involving other coronary artery of anterior wall: I21.09

## 2022-08-05 HISTORY — PX: CYSTOSCOPY WITH URETHRAL DILATATION: SHX5125

## 2022-08-05 HISTORY — DX: Type 2 diabetes mellitus with diabetic polyneuropathy: E11.42

## 2022-08-05 HISTORY — DX: Type 2 diabetes mellitus without complications: E11.9

## 2022-08-05 HISTORY — DX: Unspecified malignant neoplasm of skin, unspecified: C44.90

## 2022-08-05 HISTORY — PX: CYSTOSCOPY W/ RETROGRADES: SHX1426

## 2022-08-05 HISTORY — DX: Unspecified urethral stricture, male, unspecified site: N35.919

## 2022-08-05 HISTORY — DX: Opioid use, unspecified, uncomplicated: F11.90

## 2022-08-05 LAB — GLUCOSE, CAPILLARY
Glucose-Capillary: 114 mg/dL — ABNORMAL HIGH (ref 70–99)
Glucose-Capillary: 122 mg/dL — ABNORMAL HIGH (ref 70–99)

## 2022-08-05 SURGERY — CYSTOSCOPY, WITH URETHRAL DILATION
Anesthesia: General

## 2022-08-05 MED ORDER — ACETAMINOPHEN 10 MG/ML IV SOLN: 1000.0000 mg | Freq: Once | INTRAVENOUS | Status: DC | PRN

## 2022-08-05 MED ORDER — SODIUM CHLORIDE 0.9 % IR SOLN
Status: DC | PRN
  Administered 2022-08-05: 1500 mL

## 2022-08-05 MED ORDER — PROPOFOL 500 MG/50ML IV EMUL
INTRAVENOUS | Status: DC | PRN
  Administered 2022-08-05: 100 ug/kg/min via INTRAVENOUS

## 2022-08-05 MED ORDER — PROPOFOL 10 MG/ML IV BOLUS
INTRAVENOUS | Status: DC | PRN
  Administered 2022-08-05: 20 mg via INTRAVENOUS
  Administered 2022-08-05: 40 mg via INTRAVENOUS
  Administered 2022-08-05: 20 mg via INTRAVENOUS

## 2022-08-05 MED ORDER — PROMETHAZINE HCL 25 MG/ML IJ SOLN: 6.2500 mg | INTRAMUSCULAR | Status: DC | PRN

## 2022-08-05 MED ORDER — FAMOTIDINE 20 MG PO TABS
20.0000 mg | ORAL_TABLET | Freq: Once | ORAL | Status: AC
  Administered 2022-08-05: 20 mg via ORAL

## 2022-08-05 MED ORDER — OXYBUTYNIN CHLORIDE 5 MG PO TABS: 5.0000 mg | ORAL_TABLET | Freq: Three times a day (TID) | ORAL | 0 refills | Status: AC | PRN

## 2022-08-05 MED ORDER — MIDAZOLAM HCL 2 MG/2ML IJ SOLN
INTRAMUSCULAR | Status: DC | PRN
  Administered 2022-08-05 (×2): 1 mg via INTRAVENOUS

## 2022-08-05 MED ORDER — DROPERIDOL 2.5 MG/ML IJ SOLN: 0.6250 mg | Freq: Once | INTRAMUSCULAR | Status: DC | PRN

## 2022-08-05 MED ORDER — FENTANYL CITRATE (PF) 100 MCG/2ML IJ SOLN
INTRAMUSCULAR | Status: DC | PRN
  Administered 2022-08-05: 50 ug via INTRAVENOUS

## 2022-08-05 MED ORDER — IOHEXOL 180 MG/ML  SOLN
INTRAMUSCULAR | Status: DC | PRN
  Administered 2022-08-05: 20 mL

## 2022-08-05 MED ORDER — ONDANSETRON HCL 4 MG/2ML IJ SOLN
INTRAMUSCULAR | Status: AC
  Filled 2022-08-05: qty 2

## 2022-08-05 MED ORDER — ONDANSETRON HCL 4 MG/2ML IJ SOLN
INTRAMUSCULAR | Status: DC | PRN
  Administered 2022-08-05: 4 mg via INTRAVENOUS

## 2022-08-05 MED ORDER — OXYCODONE HCL 5 MG/5ML PO SOLN: 5.0000 mg | Freq: Once | ORAL | Status: DC | PRN

## 2022-08-05 MED ORDER — FAMOTIDINE 20 MG PO TABS
ORAL_TABLET | ORAL | Status: AC
  Filled 2022-08-05: qty 1

## 2022-08-05 MED ORDER — FENTANYL CITRATE (PF) 100 MCG/2ML IJ SOLN
INTRAMUSCULAR | Status: AC
  Filled 2022-08-05: qty 2

## 2022-08-05 MED ORDER — DEXMEDETOMIDINE HCL IN NACL 80 MCG/20ML IV SOLN
INTRAVENOUS | Status: DC | PRN
  Administered 2022-08-05: 6 ug via INTRAVENOUS

## 2022-08-05 MED ORDER — CEFAZOLIN SODIUM-DEXTROSE 2-4 GM/100ML-% IV SOLN
INTRAVENOUS | Status: AC
  Filled 2022-08-05: qty 100

## 2022-08-05 MED ORDER — PROPOFOL 1000 MG/100ML IV EMUL
INTRAVENOUS | Status: AC
  Filled 2022-08-05: qty 100

## 2022-08-05 MED ORDER — CHLORHEXIDINE GLUCONATE 0.12 % MT SOLN
OROMUCOSAL | Status: AC
  Filled 2022-08-05: qty 15

## 2022-08-05 MED ORDER — HYDROCODONE-ACETAMINOPHEN 5-325 MG PO TABS: 1.0000 | ORAL_TABLET | Freq: Three times a day (TID) | ORAL | 0 refills | Status: AC

## 2022-08-05 MED ORDER — OXYCODONE HCL 5 MG PO TABS: 5.0000 mg | ORAL_TABLET | Freq: Once | ORAL | Status: DC | PRN

## 2022-08-05 MED ORDER — FENTANYL CITRATE (PF) 100 MCG/2ML IJ SOLN: 25.0000 ug | INTRAMUSCULAR | Status: DC | PRN

## 2022-08-05 MED ORDER — MIDAZOLAM HCL 2 MG/2ML IJ SOLN
INTRAMUSCULAR | Status: AC
  Filled 2022-08-05: qty 2

## 2022-08-05 SURGICAL SUPPLY — 37 items
BAG DRAIN SIEMENS DORNER NS (MISCELLANEOUS) ×2 IMPLANT
BAG DRN NS LF (MISCELLANEOUS) ×2
BAG DRN RND TRDRP ANRFLXCHMBR (UROLOGICAL SUPPLIES) ×2
BAG URINE DRAIN 2000ML AR STRL (UROLOGICAL SUPPLIES) ×2 IMPLANT
BALLN OPTILUME DCB 24X5X75 (BALLOONS)
BALLN OPTILUME DCB 30X3X75 (BALLOONS)
BALLN OPTILUME DCB 30X5X75 (BALLOONS) ×2
BALLOON OPTILUME DCB 24X5X75 (BALLOONS) IMPLANT
BALLOON OPTILUME DCB 30X3X75 (BALLOONS) IMPLANT
BALLOON OPTILUME DCB 30X5X75 (BALLOONS) IMPLANT
BRUSH SCRUB EZ 1% IODOPHOR (MISCELLANEOUS) ×2 IMPLANT
CATH FOL 2WAY LX 16X5 (CATHETERS) IMPLANT
CATH FOL 2WAY LX 18X30 (CATHETERS) IMPLANT
CATH FOLEY 2W COUNCIL 5CC 18FR (CATHETERS) IMPLANT
CATH URETHRAL DIL 7.0X29 (CATHETERS) ×2 IMPLANT
CATH URETL OPEN 5X70 (CATHETERS) ×2 IMPLANT
DEVICE INFLATION ATRION QL4015 (MISCELLANEOUS) IMPLANT
DRAPE UTILITY 15X26 TOWEL STRL (DRAPES) ×2 IMPLANT
ELECT REM PT RETURN 9FT ADLT (ELECTROSURGICAL)
ELECTRODE REM PT RTRN 9FT ADLT (ELECTROSURGICAL) IMPLANT
GAUZE 4X4 16PLY ~~LOC~~+RFID DBL (SPONGE) ×4 IMPLANT
GLOVE BIO SURGEON STRL SZ 6.5 (GLOVE) ×2 IMPLANT
GOWN STRL REUS W/ TWL LRG LVL3 (GOWN DISPOSABLE) ×4 IMPLANT
GOWN STRL REUS W/TWL LRG LVL3 (GOWN DISPOSABLE) ×4
GUIDEWIRE STR DUAL SENSOR (WIRE) ×2 IMPLANT
IV NS IRRIG 3000ML ARTHROMATIC (IV SOLUTION) ×2 IMPLANT
KIT TURNOVER CYSTO (KITS) ×2 IMPLANT
MANIFOLD NEPTUNE II (INSTRUMENTS) ×2 IMPLANT
PACK CYSTO AR (MISCELLANEOUS) ×2 IMPLANT
SET CYSTO W/LG BORE CLAMP LF (SET/KITS/TRAYS/PACK) ×2 IMPLANT
SURGILUBE 2OZ TUBE FLIPTOP (MISCELLANEOUS) ×2 IMPLANT
SYR 10ML LL (SYRINGE) ×2 IMPLANT
SYR 30ML LL (SYRINGE) ×2 IMPLANT
TRAP FLUID SMOKE EVACUATOR (MISCELLANEOUS) ×2 IMPLANT
WATER STERILE IRR 1000ML POUR (IV SOLUTION) ×2 IMPLANT
WATER STERILE IRR 3000ML UROMA (IV SOLUTION) ×2 IMPLANT
WATER STERILE IRR 500ML POUR (IV SOLUTION) ×2 IMPLANT

## 2022-08-05 NOTE — Discharge Instructions (Addendum)
You will have your Foley catheter in for 2 days.  It is normal to see blood around the tip of the catheter.AMBULATORY SURGERY  DISCHARGE INSTRUCTIONS   The drugs that you were given will stay in your system until tomorrow so for the next 24 hours you should not:  Drive an automobile Make any legal decisions Drink any alcoholic beverage   You may resume regular meals tomorrow.  Today it is better to start with liquids and gradually work up to solid foods.  You may eat anything you prefer, but it is better to start with liquids, then soup and crackers, and gradually work up to solid foods.   Please notify your doctor immediately if you have any unusual bleeding, trouble breathing, redness and pain at the surgery site, drainage, fever, or pain not relieved by medication.    Additional Instructions:        Please contact your physician with any problems or Same Day Surgery at [phone], Monday through Friday 6 am to 4 pm, or [place] at [facility] number at [phone].

## 2022-08-05 NOTE — Anesthesia Preprocedure Evaluation (Addendum)
Anesthesia Evaluation  Patient identified by MRN, date of birth, ID band Patient awake    Reviewed: Allergy & Precautions, H&P , NPO status , Patient's Chart, lab work & pertinent test results  Airway Mallampati: III  TM Distance: >3 FB Neck ROM: full    Dental  (+) Poor Dentition, Chipped, Missing   Pulmonary COPD,  COPD inhaler and oxygen dependent, former smoker    + decreased breath sounds      Cardiovascular hypertension, + CAD, + Past MI and + Cardiac Stents  Normal cardiovascular exam  MPI 08/02/2020: no ischemia  LHC/PCI 08/10/2017: 50%/99% pRCA, 30% dRCA, 30% p-mLCx, 70% OM1, 99% thrombotic occlusion p-mLAD (2.5 x 28 mm Sierra DES), 70% pLAD (2.5 x 12 mm Sierra DES); b.) s/p staged orbital athrectomy and PCI pRCA (3.5 x 24 mm Resolute Onyx) 08/13/2017;  TTE 07/28/2020: EF 55-60%, triv TR   Neuro/Psych  PSYCHIATRIC DISORDERS Anxiety     Diabetic polyneuropathy    GI/Hepatic negative GI ROS, Neg liver ROS,,,  Endo/Other  diabetes, Type 2    Renal/GU Renal InsufficiencyRenal disease  negative genitourinary   Musculoskeletal  (+) Arthritis ,    Abdominal  (+) + obese  Peds  Hematology negative hematology ROS (+)   Anesthesia Other Findings Past Medical History: No date: Acute ST elevation myocardial infarction (STEMI) of anterior  wall (HCC)     Comment:  a.) LHC/PCI 08/10/2017: 99% thrombotic occlusion p-mLAD               (2.5 x 28 mm Sierra DES), 70% pLAD (2.5 x 12 mm Sierra               DES); b.) s/p staged orbital athrectomy and PCI pRCA (3.5              x 24 mm Resolute Onyx) 08/13/2017 No date: Anginal pain (HCC) No date: Arthritis No date: CAD (coronary artery disease)     Comment:  a.) LHC/PCI 08/10/2017: 50%/99% pRCA, 30% dRCA, 30%               p-mLCx, 70% OM1, 99% thrombotic occlusion p-mLAD (2.5 x               28 mm Sierra DES), 70% pLAD (2.5 x 12 mm Sierra DES); b.)              s/p staged  orbital athrectomy and PCI pRCA (3.5 x 24 mm               Resolute Onyx) 08/13/2017; c.) MPI 03/27/2020: mild               inferior ischemic; d.) MPI 08/02/2020: no ischemia No date: Chronic, continuous use of opioids No date: CKD (chronic kidney disease), stage II No date: COPD (chronic obstructive pulmonary disease) (HCC) No date: Diabetic polyneuropathy (HCC) No date: Glaucoma No date: History of kidney stones No date: Hyperlipidemia No date: Hypertension No date: Ischemic cardiomyopathy     Comment:  a.) TTE 08/10/17: technically difficult study, no               diagnostic RWMA, EF 40-50%, poor windows, very difficult               study; b.) TTE 12/25/2019: EF 60-65%, RAE, G1DD; c.) TTE               07/28/2020: EF 55-60%, triv TR No date: Skin cancer No date: Type 2 diabetes mellitus treated with insulin (  HCC) No date: Urethral stricture  Past Surgical History: No date: CARDIAC CATHETERIZATION 12/08/2014: COLONOSCOPY WITH PROPOFOL; N/A     Comment:  Procedure: COLONOSCOPY WITH PROPOFOL;  Surgeon: Scot Jun, MD;  Location: [facility] ENDOSCOPY;  Service:               Endoscopy;  Laterality: N/A; 01/10/2016: COLONOSCOPY WITH PROPOFOL; N/A     Comment:  Procedure: COLONOSCOPY WITH PROPOFOL;  Surgeon: Scot Jun, MD;  Location: [facility] ENDOSCOPY;  Service:               Endoscopy;  Laterality: N/A; 04/28/2019: COLONOSCOPY WITH PROPOFOL; N/A     Comment:  Procedure: COLONOSCOPY WITH PROPOFOL;  Surgeon: Earline Mayotte, MD;  Location: [facility] ENDOSCOPY;  Service:               Endoscopy;  Laterality: N/A; 03/17/2020: COLONOSCOPY WITH PROPOFOL; N/A     Comment:  Procedure: COLONOSCOPY WITH PROPOFOL;  Surgeon: Earline Mayotte, MD;  Location: [facility] ENDOSCOPY;  Service:               Endoscopy;  Laterality: N/A; 08/13/2017: CORONARY ATHERECTOMY; N/A     Comment:  Procedure: CORONARY ATHERECTOMY;  Surgeon: Iran Ouch, MD;  Location: [facility] INVASIVE CV LAB;  Service:               Cardiovascular;  Laterality: N/A; 08/13/2017: CORONARY STENT INTERVENTION; N/A     Comment:  Procedure: CORONARY STENT INTERVENTION;  Surgeon: Iran Ouch, MD;  Location: [facility] INVASIVE CV LAB;  Service:               Cardiovascular;  Laterality: N/A; 08/10/2017: CORONARY/GRAFT ACUTE MI REVASCULARIZATION; N/A     Comment:  Procedure: Coronary/Graft Acute MI Revascularization;                Surgeon: Kathleene Hazel, MD;  Location: [facility]               INVASIVE CV LAB;  Service: Cardiovascular;  Laterality:               N/A; 11/09/2015: EXTRACORPOREAL SHOCK WAVE LITHOTRIPSY; Right     Comment:  Procedure: EXTRACORPOREAL SHOCK WAVE LITHOTRIPSY (ESWL);              Surgeon: Orson Ape, MD;  Location: [facility] ORS;                Service: Urology;  Laterality: Right; 10/17/2016: EXTRACORPOREAL SHOCK WAVE LITHOTRIPSY; Left     Comment:  Procedure: EXTRACORPOREAL SHOCK WAVE LITHOTRIPSY (ESWL);              Surgeon: Orson Ape, MD;  Location: [facility] ORS;                Service: Urology;  Laterality: Left; 08/10/2017: LEFT HEART CATH AND CORONARY ANGIOGRAPHY; N/A     Comment:  Procedure: LEFT HEART CATH AND CORONARY ANGIOGRAPHY;                Surgeon: Clifton James,  Nile Dear, MD;  Location: [facility]               INVASIVE CV LAB;  Service: Cardiovascular;  Laterality:               N/A; No date: LITHOTRIPSY     Comment:  x 6 2015: PAROTIDECTOMY 08/13/2017: TEMPORARY PACEMAKER; N/A     Comment:  Procedure: TEMPORARY PACEMAKER;  Surgeon: Iran Ouch, MD;  Location: [facility] INVASIVE CV LAB;  Service:               Cardiovascular;  Laterality: N/A; No date: TONSILLECTOMY     Reproductive/Obstetrics negative OB ROS                             Anesthesia Physical Anesthesia Plan  ASA: 3  Anesthesia Plan: General   Post-op Pain Management: Minimal or no pain  anticipated   Induction: Intravenous  PONV Risk Score and Plan: 2 and Propofol infusion and TIVA  Airway Management Planned: Natural Airway  Additional Equipment:   Intra-op Plan:   Post-operative Plan:   Informed Consent: I have reviewed the patients History and Physical, chart, labs and discussed the procedure including the risks, benefits and alternatives for the proposed anesthesia with the patient or authorized representative who has indicated his/her understanding and acceptance.     Dental Advisory Given  Plan Discussed with: CRNA and Surgeon  Anesthesia Plan Comments: (Backup airway discussed)       Anesthesia Quick Evaluation

## 2022-08-05 NOTE — Interval H&P Note (Signed)
History and Physical Interval Note:  08/05/2022 8:36 AM  Henry [name] A Santarelli  has presented today for surgery, with the diagnosis of Urethral Stricture, Microscopic Hematuria.  The various methods of treatment have been discussed with the patient and family. After consideration of risks, benefits and other options for treatment, the patient has consented to  Procedure(s): CYSTOSCOPY WITH URETHRAL BALLOON  DILATATION WITH OPTILUME (N/A) CYSTOSCOPY WITH RETROGRADE PYELOGRAM (Bilateral) as a surgical intervention.  The patient's history has been reviewed, patient examined, no change in status, stable for surgery.  I have reviewed the patient's chart and labs.  Questions were answered to the patient's satisfaction.    RRR CTAB  [name]

## 2022-08-05 NOTE — Op Note (Signed)
Date of procedure: 08/05/22  Preoperative diagnosis:  Hematuria Pendulous urethral stricture  Postoperative diagnosis:  Same as above  Procedure: Cystoscopy Balloon dilation of urethra with Optilume balloon Bilateral retrograde Polygram  Surgeon: [name], MD  Anesthesia: MAC  Complications: None  Intraoperative findings: 8 French concentric dense mid pendulous urethral strictures, at least a few centimeters in length.  Remainder of the urethra was normal.  Normal bladder.  Bilateral retrogrades unremarkable.  EBL: Minimal  Specimens: None  Drains: 18 French council tip Foley catheter  Indication: Siddharth Babington Bruening is a 77 y.o. patient with high risk hematuria unable to have cystoscopy in the office secondary to pendulous urethral stricture.  After reviewing the management options for treatment, he elected to proceed with the above surgical procedure(s). We have discussed the potential benefits and risks of the procedure, side effects of the proposed treatment, the likelihood of the patient achieving the goals of the procedure, and any potential problems that might occur during the procedure or recuperation. Informed consent has been obtained.  Description of procedure:  The patient was taken to the operating room and general anesthesia was induced.  The patient was placed in the dorsal lithotomy position, prepped and draped in the usual sterile fashion, and preoperative antibiotics were administered. A preoperative time-out was performed.   A 21 French cystoscope was advanced to the mid pendulous urethra where fairly dense mid pendulous urethral strictures were identified.  These were concentric, dense, and approximately 8 [place].  Under direct visualization I advanced a wire which went easily.  I then used a regular urethral dilation balloon over the wire in order to dilate this area of the urethra.  Once this was achieved, I backloaded the wire into the cystoscope and  advanced the scope into the bladder over the wire.  There were no other additional strictures identified within normal prostate.  The bladder was then carefully inspected.  There were no tumors, ulcerations, or lesions within the bladder.  Attention was first turned to the left UO which was cannulated using a 5 [place] open-ended ureteral catheter.  A gentle retrograde pyelogram on the side showed no hydroureteronephrosis or filling defects.  The same procedure was performed on the right side, no hydroureteronephrosis or filling defects on the side either.  Finally, the scope was then backed down the length of the urethra inspecting little along the way.  I left the wire in place.  There was some mild trauma in the mid pendulous urethra from the previous dilation otherwise it was unremarkable.  I then brought in an Optilume balloon and advanced over the wire to the level of the strictured area.  I used a 48 French 5 cm balloon to traverse this whole area.  The balloon was inflated to 6 cm of water.  This was allowed to stay inflated for a total of 5 minutes.  At the end of the 5 minutes, the balloon was deflated and an 51 [place] council tip was placed over the wire into the bladder.  The balloon was filled with 10 cc of sterile water.  The Foley was then attached to the catheter bag.  The patient was then cleaned and dried, repositioned in the supine position reversed of anesthesia and taken to the PACU in stable condition.  Plan: Will have his Foley catheter removed in 2 days.  He was provided with prescriptions for pain as well as bladder spasms.  Will see him in 4 to 6 weeks after his catheter is removed  to reassess his urinary symptoms.  [name], M.D.

## 2022-08-05 NOTE — Transfer of Care (Signed)
Immediate Anesthesia Transfer of Care Note  Patient: Henry [name]  Procedure(s) Performed: CYSTOSCOPY WITH URETHRAL BALLOON  DILATATION WITH OPTILUME CYSTOSCOPY WITH RETROGRADE PYELOGRAM (Bilateral)  Patient Location: PACU  Anesthesia Type:MAC  Level of Consciousness: drowsy and patient cooperative  Airway & Oxygen Therapy: Patient Spontanous Breathing and Patient connected to face mask oxygen  Post-op Assessment: Report given to RN and Post -op Vital signs reviewed and stable  Post vital signs: Reviewed and stable  Last Vitals:  Vitals Value Taken Time  BP    Temp    Pulse 76 08/05/22 0947  Resp 16 08/05/22 0947  SpO2 97 % 08/05/22 0947  Vitals shown include unvalidated device data.  Last Pain:  Vitals:   08/05/22 0734  TempSrc: Oral         Complications: No notable events documented.

## 2022-08-06 ENCOUNTER — Encounter: Payer: Self-pay | Admitting: Urology

## 2022-08-06 NOTE — Anesthesia Postprocedure Evaluation (Signed)
Anesthesia Post Note  Patient: [name]  Procedure(s) Performed: CYSTOSCOPY WITH URETHRAL BALLOON  DILATATION WITH OPTILUME CYSTOSCOPY WITH RETROGRADE PYELOGRAM (Bilateral)  Patient location during evaluation: PACU Anesthesia Type: General Level of consciousness: awake and alert Pain management: pain level controlled Vital Signs Assessment: post-procedure vital signs reviewed and stable Respiratory status: spontaneous breathing, nonlabored ventilation and respiratory function stable Cardiovascular status: blood pressure returned to baseline and stable Postop Assessment: no apparent nausea or vomiting Anesthetic complications: no   No notable events documented.   Last Vitals:  Vitals:   08/05/22 1015 08/05/22 1029  BP: 121/67 128/80  Pulse: 64 (!) 59  Resp: 14 20  Temp:  (!) 36.2 C  SpO2: 91% 96%    Last Pain:  Vitals:   08/05/22 1029  TempSrc: Temporal  PainSc: 0-No pain                 [name]

## 2022-08-07 ENCOUNTER — Ambulatory Visit: Payer: Medicare HMO | Admitting: Physician Assistant

## 2022-08-07 DIAGNOSIS — N35013 Post-traumatic anterior urethral stricture: Secondary | ICD-10-CM

## 2022-08-07 NOTE — Progress Notes (Signed)
Catheter Removal  Patient is present today for a catheter removal.  10 ml of water was drained from the balloon. A 16 FR foley cath was removed from the bladder, no complications were noted. Patient tolerated well.  Performed by: Randa Lynn, RMA  Follow up/ Additional notes: June 5th with Dr.Brandon, pt was advised to call us if he has any issues emptying bladder or any UTI symptoms

## 2022-08-08 DIAGNOSIS — Z794 Long term (current) use of insulin: Secondary | ICD-10-CM | POA: Diagnosis not present

## 2022-08-08 DIAGNOSIS — N1831 Chronic kidney disease, stage 3a: Secondary | ICD-10-CM | POA: Diagnosis not present

## 2022-08-08 DIAGNOSIS — E11649 Type 2 diabetes mellitus with hypoglycemia without coma: Secondary | ICD-10-CM | POA: Diagnosis not present

## 2022-08-08 DIAGNOSIS — E1122 Type 2 diabetes mellitus with diabetic chronic kidney disease: Secondary | ICD-10-CM | POA: Diagnosis not present

## 2022-08-20 DIAGNOSIS — Z87891 Personal history of nicotine dependence: Secondary | ICD-10-CM | POA: Diagnosis not present

## 2022-08-20 DIAGNOSIS — R0602 Shortness of breath: Secondary | ICD-10-CM | POA: Diagnosis not present

## 2022-08-20 DIAGNOSIS — I491 Atrial premature depolarization: Secondary | ICD-10-CM | POA: Diagnosis not present

## 2022-08-20 DIAGNOSIS — I10 Essential (primary) hypertension: Secondary | ICD-10-CM | POA: Diagnosis not present

## 2022-08-20 DIAGNOSIS — Z955 Presence of coronary angioplasty implant and graft: Secondary | ICD-10-CM | POA: Diagnosis not present

## 2022-08-20 DIAGNOSIS — I2510 Atherosclerotic heart disease of native coronary artery without angina pectoris: Secondary | ICD-10-CM | POA: Diagnosis not present

## 2022-08-20 DIAGNOSIS — J449 Chronic obstructive pulmonary disease, unspecified: Secondary | ICD-10-CM | POA: Diagnosis not present

## 2022-08-20 DIAGNOSIS — I2109 ST elevation (STEMI) myocardial infarction involving other coronary artery of anterior wall: Secondary | ICD-10-CM | POA: Diagnosis not present

## 2022-08-21 ENCOUNTER — Ambulatory Visit
Payer: Medicare HMO | Attending: Student in an Organized Health Care Education/Training Program | Admitting: Student in an Organized Health Care Education/Training Program

## 2022-08-21 ENCOUNTER — Encounter: Payer: Self-pay | Admitting: Student in an Organized Health Care Education/Training Program

## 2022-08-21 DIAGNOSIS — E1140 Type 2 diabetes mellitus with diabetic neuropathy, unspecified: Secondary | ICD-10-CM | POA: Insufficient documentation

## 2022-08-21 DIAGNOSIS — G894 Chronic pain syndrome: Secondary | ICD-10-CM | POA: Insufficient documentation

## 2022-08-21 MED ORDER — CAPSAICIN-CLEANSING GEL 8 % EX KIT
4.0000 | PACK | Freq: Once | CUTANEOUS | Status: AC
  Administered 2022-08-21: 4 via TOPICAL
  Filled 2022-08-21: qty 4

## 2022-08-21 NOTE — Progress Notes (Signed)
PROVIDER NOTE: Interpretation of information contained herein should be left to medically-trained personnel. Specific patient instructions are provided elsewhere under "Patient Instructions" section of medical record. This document was created in part using STT-dictation technology, any transcriptional errors that may result from this process are unintentional.  Patient: Henry [name] Type: Established DOB: 14-Jul-1945 MRN: [id number] PCP: Dorothey Baseman, MD  Service: Procedure DOS: 08/21/2022 Setting: Ambulatory Location: Ambulatory outpatient facility Delivery: Face-to-face Provider: Edward Jolly, MD Specialty: Interventional Pain Management Specialty designation: 09 Location: Outpatient facility Ref. Prov.: Edward Jolly, MD       Interventional Therapy     Interventional Treatment:          Procedure: Qutenza Neurolysis #2  Laterality:  Bilateral Area treated: Feet Imaging Guidance: None Anesthesia/analgesia/anxiolysis/sedation: None required Medication (Right): Qutenza patches Medication (Left): Qutenza patches Date: 08/21/2022 Performed by: Edward Jolly, MD 1. Chronic painful diabetic neuropathy (HCC)   2. Chronic pain syndrome    NAS-11 Pain score:   Pre-procedure: 8 /10   Post-procedure: 8 /10     Position / Prep / Materials:  Position: Supine  Materials: Qutenza Kit  Pre-op H&P Assessment:  Henry [name] is a 77 y.o. (year old), male patient, seen today for interventional treatment. He  has a past surgical history that includes Lithotripsy; Colonoscopy with propofol (N/A, 12/08/2014); Parotidectomy (2015); Extracorporeal shock wave lithotripsy (Right, 11/09/2015); Tonsillectomy; Colonoscopy with propofol (N/A, 01/10/2016); Extracorporeal shock wave lithotripsy (Left, 10/17/2016); Coronary/Graft Acute MI Revascularization (N/A, 08/10/2017); LEFT HEART CATH AND CORONARY ANGIOGRAPHY (N/A, 08/10/2017); CORONARY ATHERECTOMY (N/A, 08/13/2017); CORONARY STENT INTERVENTION (N/A,  08/13/2017); TEMPORARY PACEMAKER (N/A, 08/13/2017); Cardiac catheterization; Colonoscopy with propofol (N/A, 04/28/2019); Colonoscopy with propofol (N/A, 03/17/2020); Cystoscopy with urethral dilatation (N/A, 08/05/2022); and Cystoscopy w/ retrogrades (Bilateral, 08/05/2022). Henry [name] has a current medication list which includes the following prescription(s): aspirin ec, atorvastatin, cetirizine, diclofenac sodium, duloxetine, empagliflozin, fluticasone, furosemide, gabapentin, gabapentin, hydrocodone-acetaminophen, insulin glargine-yfgn, ipratropium-albuterol, irbesartan, metoprolol tartrate, montelukast, multivitamin with minerals, nitroglycerin, oxybutynin, sitagliptin-metformin, and tamsulosin. His primarily concern today is the Foot Pain (bilateral) and Hand Pain (left)  Initial Vital Signs:  Pulse/HCG Rate: 61  Temp: (!) 97.5 F (36.4 C) Resp: 16 BP: 123/73 SpO2: 91 %  BMI: Estimated body mass index is 31.38 kg/m as calculated from the following:   Height as of this encounter: 5\' 11"  (1.803 m).   Weight as of this encounter: 225 lb (102.1 kg).  Risk Assessment: Allergies: Reviewed. He is allergic to lisinopril.  Allergy Precautions: None required Coagulopathies: Reviewed. None identified.  Blood-thinner therapy: None at this time Active Infection(s): Reviewed. None identified. Henry [name] is afebrile  Site Confirmation: Henry [name] was asked to confirm the procedure and laterality before marking the site Procedure checklist: Completed Consent: Before the procedure and under the influence of no sedative(s), amnesic(s), or anxiolytics, the patient was informed of the treatment options, risks and possible complications. To fulfill our ethical and legal obligations, as recommended by the American Medical Association's Code of Ethics, I have informed the patient of my clinical impression; the nature and purpose of the treatment or procedure; the risks, benefits, and possible complications  of the intervention; the alternatives, including doing nothing; the risk(s) and benefit(s) of the alternative treatment(s) or procedure(s); and the risk(s) and benefit(s) of doing nothing. The patient was provided information about the general risks and possible complications associated with the procedure. These may include, but are not limited to: failure to achieve desired goals, infection, bleeding, organ or nerve damage, allergic reactions, paralysis, and death. In  addition, the patient was informed of those risks and complications associated to the procedure, such as failure to decrease pain; infection; bleeding; organ or nerve damage with subsequent damage to sensory, motor, and/or autonomic systems, resulting in permanent pain, numbness, and/or weakness of one or several areas of the body; allergic reactions; (i.e.: anaphylactic reaction); and/or death. Furthermore, the patient was informed of those risks and complications associated with the medications. These include, but are not limited to: allergic reactions (i.e.: anaphylactic or anaphylactoid reaction(s)); adrenal axis suppression; blood sugar elevation that in diabetics may result in ketoacidosis or comma; water retention that in patients with history of congestive heart failure may result in shortness of breath, pulmonary edema, and decompensation with resultant heart failure; weight gain; swelling or edema; medication-induced neural toxicity; particulate matter embolism and blood vessel occlusion with resultant organ, and/or nervous system infarction; and/or aseptic necrosis of one or more joints. Finally, the patient was informed that Medicine is not an exact science; therefore, there is also the possibility of unforeseen or unpredictable risks and/or possible complications that may result in a catastrophic outcome. The patient indicated having understood very clearly. We have given the patient no guarantees and we have made no promises. Enough  time was given to the patient to ask questions, all of which were answered to the patient's satisfaction. Henry [name] has indicated that he wanted to continue with the procedure. Attestation: I, the ordering provider, attest that I have discussed with the patient the benefits, risks, side-effects, alternatives, likelihood of achieving goals, and potential problems during recovery for the procedure that I have provided informed consent. Date  Time: 08/21/2022 12:35 PM  Pre-Procedure Preparation:  Monitoring: As per clinic protocol. Respiration, ETCO2, SpO2, BP, heart rate and rhythm monitor placed and checked for adequate function Safety Precautions: Patient was assessed for positional comfort and pressure points before starting the procedure. Time-out: I initiated and conducted the "Time-out" before starting the procedure, as per protocol. The patient was asked to participate by confirming the accuracy of the "Time Out" information. Verification of the correct person, site, and procedure were performed and confirmed by me, the nursing staff, and the patient. "Time-out" conducted as per Joint Commission's Universal Protocol (UP.01.01.01). Time: 1301  Description/Narrative of Procedure:          Region: Distal lower extremity Target Area: Sensory peripheral nerves affected by diabetic peripheral neuropathy Site: Feet Approach: Percutaneous  No./Series: Not applicable  Type: Percutaneous  Purpose: Therapeutic  Region: Distal lower extremities  Description of the Procedure: Protocol guidelines were followed. The patient was assisted into a comfortable position.  Informed consent was obtained in the patient monitored in the usual manner.  All questions were answered prior to the procedure.  They Qutenza patches were applied to the affected area and then covered with the wrap.  The Patient was kept under observation until the treatment was completed.  The patches were removed and the treated area was  inspected.  Vitals:   08/21/22 1253 08/21/22 1255  BP:  123/73  Pulse:  61  Resp: 16   Temp: (!) 97.5 F (36.4 C)   SpO2:  91%  Weight: 225 lb (102.1 kg)   Height: 5\' 11"  (1.803 m)      Start Time: 1301 hrs. End Time:   hrs.   Post-operative Assessment:  Post-procedure Vital Signs:  Pulse/HCG Rate: 61  Temp: (!) 97.5 F (36.4 C) Resp: 16 BP: 123/73 SpO2: 91 %  EBL: None  Complications: No immediate post-treatment complications  observed by team, or reported by patient.  Note: The patient tolerated the entire procedure well. A repeat set of vitals were taken after the procedure and the patient was kept under observation following institutional policy, for this type of procedure. Post-procedural neurological assessment was performed, showing return to baseline, prior to discharge. The patient was provided with post-procedure discharge instructions, including a section on how to identify potential problems. Should any problems arise concerning this procedure, the patient was given instructions to immediately contact us, at any time, without hesitation. In any case, we plan to contact the patient by telephone for a follow-up status report regarding this interventional procedure.  Comments:  No additional relevant information.  Plan of Care (POC)  Orders:  Orders Placed This Encounter  Procedures   NEUROLYSIS    Please order Qutenza patches from pharmacy    Standing Status:   Future    Standing Expiration Date:   12/22/2022    Order Specific Question:   Where will this procedure be performed?    Answer:   [facility]   Medications ordered for procedure: Meds ordered this encounter  Medications   capsaicin topical system 8 % patch 4 patch   Medications administered: We administered capsaicin topical system.  See the medical record for exact dosing, route, and time of administration.  Follow-up plan:   Return in about 3 months (around 11/21/2022) for Qutenza.       Recent Visits Date Type Provider Dept  06/25/22 Procedure visit Edward Jolly, MD [facility]  06/12/22 Office Visit Edward Jolly, MD [facility]  Showing recent visits within past 90 days and meeting all other requirements Today's Visits Date Type Provider Dept  08/21/22 Procedure visit Edward Jolly, MD [facility]  Showing today's visits and meeting all other requirements Future Appointments No visits were found meeting these conditions. Showing future appointments within next 90 days and meeting all other requirements  Disposition: Discharge home  Discharge (Date  Time): 08/21/2022; 1400 hrs.   Primary Care Physician: Dorothey Baseman, MD Location: [facility] Note by: Edward Jolly, MD Date: 08/21/2022; Time: 3:24 PM  Disclaimer:  Medicine is not an exact science. The only guarantee in medicine is that nothing is guaranteed. It is important to note that the decision to proceed with this intervention was based on the information collected from the patient. The Data and conclusions were drawn from the patient's questionnaire, the interview, and the physical examination. Because the information was provided in large part by the patient, it cannot be guaranteed that it has not been purposely or unconsciously manipulated. Every effort has been made to obtain as much relevant data as possible for this evaluation. It is important to note that the conclusions that lead to this procedure are derived in large part from the available data. Always take into account that the treatment will also be dependent on availability of resources and existing treatment guidelines, considered by other Pain Management Practitioners as being common knowledge and practice, at the time of the intervention. For Medico-Legal purposes, it is also important to point out that variation in procedural techniques and pharmacological choices are the acceptable  norm. The indications, contraindications, technique, and results of the above procedure should only be interpreted and judged by a Board-Certified Interventional Pain Specialist with extensive familiarity and expertise in the same exact procedure and technique.

## 2022-08-21 NOTE — Patient Instructions (Signed)
Use the cleansing cream as needed.

## 2022-08-21 NOTE — Progress Notes (Signed)
Safety precautions to be maintained throughout the outpatient stay will include: orient to surroundings, keep bed in low position, maintain call bell within reach at all times, provide assistance with transfer out of bed and ambulation.  

## 2022-08-22 ENCOUNTER — Telehealth: Payer: Self-pay | Admitting: *Deleted

## 2022-08-22 NOTE — Telephone Encounter (Signed)
Attempted to call for post Qutenza application. Message left. 

## 2022-09-11 ENCOUNTER — Ambulatory Visit: Payer: Medicare HMO | Admitting: Urology

## 2022-09-16 NOTE — Progress Notes (Unsigned)
09/17/2022 9:53 AM   Henry [name] [birth year] [id number]  Referring provider: Dorothey Baseman, MD [phone] [place],  [place] [address]  Urological history: 1. 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  Nephrolithiasis -Left ESWL (2018)  -CT (2023) -bilateral renal stones up to 1.3 cm on the right and 0.9 cm on the left  3.  High risk hematuria -former smoker -Non-contrast CT (2023) -no malignancies noted -cysto (2023) - no malignancies noted   4. Urethral stricture -dilation of mid pendulous urethral strictures with Optilume (07/2022)   Chief Complaint  Patient presents with   Benign Prostatic Hypertrophy    HPI: Henry [name] is a 77 y.o. male who presents today for follow up.   Previous records reviewed.   UA yellow clear, 3+ glucose, specific gravity 1.010, pH 5.5, 0-5 WBCs, 0-2 RBCs, 0-10 epithelial cells, mucus threads are present and a few bacteria.  PVR 20 mL   KUB cluster of stones in the upper pole of the right kidney with smaller stones in the lower pole, clusters of stones in the left upper mid and lower poles of the left kidney.  It appears stable compared to CT renal stone study from September 2023  I PSS 4/2  He is having more daytime frequency having to use the restroom every 2-3 hours.  He has nocturia x 1.  Patient denies any modifying or aggravating factors.  Patient denies any gross hematuria, dysuria or suprapubic/flank pain.  Patient denies any fevers, chills, nausea or vomiting.     IPSS     Row Name 09/17/22 0900         International Prostate Symptom Score   How often have you had the sensation of not emptying your bladder? Less than 1 in 5     How often have you had to urinate less than every two hours? Less than 1 in 5 times     How often have you found you stopped and started again several times when you urinated? Not at All      How often have you found it difficult to postpone urination? Less than 1 in 5 times     How often have you had a weak urinary stream? Not at All     How often have you had to strain to start urination? Not at All     How many times did you typically get up at night to urinate? 1 Time     Total IPSS Score 4       Quality of Life due to urinary symptoms   If you were to spend the rest of your life with your urinary condition just the way it is now how would you feel about that? Mostly Satisfied              Score:  1-7 Mild 8-19 Moderate 20-35 Severe    PMH: Past Medical History:  Diagnosis Date   Acute ST elevation myocardial infarction (STEMI) of anterior wall (HCC)    a.) LHC/PCI 08/10/2017: 99% thrombotic occlusion p-mLAD (2.5 x 28 mm Sierra DES), 70% pLAD (2.5 x 12 mm [place] DES); b.) s/p staged orbital athrectomy and PCI pRCA (3.5 x 24 mm Resolute Onyx) 08/13/2017   Anginal pain (HCC)    Arthritis    CAD (coronary artery disease)    a.) LHC/PCI 08/10/2017: 50%/99% pRCA, 30% dRCA, 30% p-mLCx,  70% OM1, 99% thrombotic occlusion p-mLAD (2.5 x 28 mm Sierra DES), 70% pLAD (2.5 x 12 mm [place] DES); b.) s/p staged orbital athrectomy and PCI pRCA (3.5 x 24 mm Resolute Onyx) 08/13/2017; c.) MPI 03/27/2020: mild inferior ischemic; d.) MPI 08/02/2020: no ischemia   Chronic, continuous use of opioids    CKD (chronic kidney disease), stage II    COPD (chronic obstructive pulmonary disease) (HCC)    Diabetic polyneuropathy (HCC)    Glaucoma    History of kidney stones    Hyperlipidemia    Hypertension    Ischemic cardiomyopathy    a.) TTE 08/10/17: technically difficult study, no diagnostic RWMA, EF 40-50%, poor windows, very difficult study; b.) TTE 12/25/2019: EF 60-65%, RAE, G1DD; c.) TTE 07/28/2020: EF 55-60%, triv TR   Skin cancer    Type 2 diabetes mellitus treated with insulin (HCC)    Urethral stricture     Surgical History: Past Surgical History:  Procedure Laterality Date    CARDIAC CATHETERIZATION     COLONOSCOPY WITH PROPOFOL N/A 12/08/2014   Procedure: COLONOSCOPY WITH PROPOFOL;  Surgeon: Scot Jun, MD;  Location: [facility] ENDOSCOPY;  Service: Endoscopy;  Laterality: N/A;   COLONOSCOPY WITH PROPOFOL N/A 01/10/2016   Procedure: COLONOSCOPY WITH PROPOFOL;  Surgeon: Scot Jun, MD;  Location: [facility] ENDOSCOPY;  Service: Endoscopy;  Laterality: N/A;   COLONOSCOPY WITH PROPOFOL N/A 04/28/2019   Procedure: COLONOSCOPY WITH PROPOFOL;  Surgeon: Earline Mayotte, MD;  Location: [facility] ENDOSCOPY;  Service: Endoscopy;  Laterality: N/A;   COLONOSCOPY WITH PROPOFOL N/A 03/17/2020   Procedure: COLONOSCOPY WITH PROPOFOL;  Surgeon: Earline Mayotte, MD;  Location: [facility] ENDOSCOPY;  Service: Endoscopy;  Laterality: N/A;   CORONARY ATHERECTOMY N/A 08/13/2017   Procedure: CORONARY ATHERECTOMY;  Surgeon: Iran Ouch, MD;  Location: [facility];  Service: Cardiovascular;  Laterality: N/A;   CORONARY STENT INTERVENTION N/A 08/13/2017   Procedure: CORONARY STENT INTERVENTION;  Surgeon: Iran Ouch, MD;  Location: [facility];  Service: Cardiovascular;  Laterality: N/A;   CORONARY/GRAFT ACUTE MI REVASCULARIZATION N/A 08/10/2017   Procedure: Coronary/Graft Acute MI Revascularization;  Surgeon: Kathleene Hazel, MD;  Location: [facility];  Service: Cardiovascular;  Laterality: N/A;   CYSTOSCOPY W/ RETROGRADES Bilateral 08/05/2022   Procedure: CYSTOSCOPY WITH RETROGRADE PYELOGRAM;  Surgeon: [name], MD;  Location: [facility];  Service: Urology;  Laterality: Bilateral;   CYSTOSCOPY WITH URETHRAL DILATATION N/A 08/05/2022   Procedure: CYSTOSCOPY WITH URETHRAL BALLOON  DILATATION WITH OPTILUME;  Surgeon: [name], MD;  Location: [facility];  Service: Urology;  Laterality: N/A;   EXTRACORPOREAL SHOCK WAVE LITHOTRIPSY Right 11/09/2015   Procedure: EXTRACORPOREAL SHOCK WAVE LITHOTRIPSY (ESWL);  Surgeon: Orson Ape, MD;  Location: [facility];   Service: Urology;  Laterality: Right;   EXTRACORPOREAL SHOCK WAVE LITHOTRIPSY Left 10/17/2016   Procedure: EXTRACORPOREAL SHOCK WAVE LITHOTRIPSY (ESWL);  Surgeon: Orson Ape, MD;  Location: [facility];  Service: Urology;  Laterality: Left;   LEFT HEART CATH AND CORONARY ANGIOGRAPHY N/A 08/10/2017   Procedure: LEFT HEART CATH AND CORONARY ANGIOGRAPHY;  Surgeon: Kathleene Hazel, MD;  Location: [facility];  Service: Cardiovascular;  Laterality: N/A;   LITHOTRIPSY     x 6   PAROTIDECTOMY  2015   TEMPORARY PACEMAKER N/A 08/13/2017   Procedure: TEMPORARY PACEMAKER;  Surgeon: Iran Ouch, MD;  Location: [facility];  Service: Cardiovascular;  Laterality: N/A;   TONSILLECTOMY      Home  Medications:  Allergies as of 09/17/2022       Reactions   Lisinopril Cough        Medication List        Accurate as of September 17, 2022  9:53 AM. If you have any questions, ask your nurse or doctor.          aspirin EC 81 MG tablet Take 1 tablet (81 mg total) by mouth daily.   atorvastatin 80 MG tablet Commonly known as: LIPITOR Take 1 tablet (80 mg total) by mouth daily at 6 PM. What changed: when to take this   cetirizine 10 MG tablet Commonly known as: ZYRTEC Take 10 mg by mouth daily.   COMBIVENT IN Inhale 1 puff into the lungs daily.   dextrose 40 % Gel Commonly known as: GLUTOSE Take by mouth.   diclofenac Sodium 1 % Gel Commonly known as: VOLTAREN 2 g as needed.   DULoxetine 30 MG capsule Commonly known as: CYMBALTA Take 30 mg by mouth daily.   empagliflozin 25 MG Tabs tablet Commonly known as: JARDIANCE Take 25 mg by mouth daily.   fluticasone 50 MCG/ACT nasal spray Commonly known as: FLONASE Place 1 spray into both nostrils daily.   furosemide 20 MG tablet Commonly known as: LASIX Take 1 tablet (20 mg total) by mouth daily.   gabapentin 600 MG tablet Commonly known as: NEURONTIN Take 600 mg by mouth 3 (three) times daily.    gabapentin 300 MG capsule Commonly known as: NEURONTIN Take 300 mg by mouth at bedtime.   Gemtesa 75 MG Tabs Generic drug: Vibegron Take 1 tablet (75 mg total) by mouth daily. Started by: Michiel Cowboy, PA-C   HYDROcodone-acetaminophen 5-325 MG tablet Commonly known as: NORCO/VICODIN Take 1 tablet by mouth 3 (three) times daily.   insulin glargine-yfgn 100 UNIT/ML injection Commonly known as: SEMGLEE 90 Units 2 (two) times daily.   irbesartan 150 MG tablet Commonly known as: AVAPRO Take 150 mg by mouth every evening.   metoprolol tartrate 25 MG tablet Commonly known as: LOPRESSOR Take 1 tablet (25 mg total) by mouth 2 (two) times daily.   montelukast 10 MG tablet Commonly known as: SINGULAIR Take 10 mg by mouth at bedtime.   multivitamin with minerals Tabs tablet Take 1 tablet by mouth daily.   nitroGLYCERIN 0.4 MG SL tablet Commonly known as: NITROSTAT PLACE 1 TABLET UNDER TONGUE EVERY 5 MIN AS NEEDED FOR CHEST PAIN IF NO RELIEF IN15 MIN CALL 911 (MAX 3 TABS)   oxybutynin 5 MG tablet Commonly known as: DITROPAN Take 1 tablet (5 mg total) by mouth every 8 (eight) hours as needed for bladder spasms.   sitaGLIPtin-metformin 50-1000 MG tablet Commonly known as: JANUMET Take 1 tablet by mouth 2 (two) times daily with a meal.   tamsulosin 0.4 MG Caps capsule Commonly known as: FLOMAX Take 0.4 mg by mouth daily.        Allergies:  Allergies  Allergen Reactions   Lisinopril Cough    Family History: Family History  Problem Relation Age of Onset   Heart attack Mother    Melanoma Father    Parkinsonism Father     Social History:  reports that he quit smoking about 5 years ago. His smoking use included cigarettes. He has a 50.00 pack-year smoking history. He has never used smokeless tobacco. He reports that he does not drink alcohol and does not use drugs.  ROS: Pertinent ROS in HPI  Physical Exam: BP 121/72   Pulse  90   Ht 5\' 11"  (1.803 m)   Wt 225  lb (102.1 kg)   BMI 31.38 kg/m   Constitutional:  Well nourished. Alert and oriented, No acute distress. HEENT: [place] AT, moist mucus membranes.  Trachea midline Cardiovascular: No clubbing, cyanosis, or edema. Respiratory: Normal respiratory effort, no increased work of breathing. Neurologic: Grossly intact, no focal deficits, moving all 4 extremities. Psychiatric: Normal mood and affect.  Laboratory Data: Lab Results  Component Value Date   WBC 10.1 07/30/2022   HGB 15.4 07/30/2022   HCT 49.6 07/30/2022   MCV 89.5 07/30/2022   PLT 286 07/30/2022   Lab Results  Component Value Date   CREATININE 1.29 (H) 07/30/2022   Serum creatinine (08/2022) 1.1, eGFR 70  Hemoglobin A1c (08/2022) 8.4   Lab Results  Component Value Date   AST 33 12/20/2021   Lab Results  Component Value Date   ALT 30 12/20/2021    Urinalysis  See EPIC and HPI I have reviewed the labs.   Pertinent Imaging: KUB bilateral nephrolithiasis-stable when compared to CT renal stone study  09/17/22 09:15  Scan Result 20   I have independently reviewed the films.  See HPI.  Radiologist review is pending  Assessment & Plan:    1.  Nephrolithiasis -KUB Bilateral nephrolithiasis -Asymptomatic at this time  2. Urethral stricture -PVR 20 mL  3. BPH with LU TS -Continue tamsulosin 0.4 mg daily  -He is having issues with urinary frequency, did explain that it may be contribute to his diuretic and glucosuria but we will go ahead and give samples of Gemtesa 75 mg daily -He will follow-up in 6 weeks for IPSS and PVR  4. High risk hematuria -former smoking -recent work up -bilateral nephrolithiasis and urethral stricture disease -no reports of gross heme -UA negative for micro heme  Return in about 6 weeks (around 10/29/2022) for IPSS and PVR.  These notes generated with voice recognition software. I apologize for typographical errors.  Cloretta Ned  [facility] [address]  [address] [place], [place] [address] [phone]

## 2022-09-17 ENCOUNTER — Ambulatory Visit: Payer: Medicare HMO | Admitting: Urology

## 2022-09-17 ENCOUNTER — Telehealth: Payer: Self-pay

## 2022-09-17 ENCOUNTER — Ambulatory Visit
Admission: RE | Admit: 2022-09-17 | Discharge: 2022-09-17 | Disposition: A | Discharge status: Home / Self Care | Payer: Medicare HMO | Attending: Urology | Admitting: Urology

## 2022-09-17 ENCOUNTER — Ambulatory Visit
Admission: RE | Admit: 2022-09-17 | Discharge: 2022-09-17 | Disposition: A | Discharge status: Home / Self Care | Payer: Medicare HMO | Source: Ambulatory Visit | Attending: Urology | Admitting: Urology

## 2022-09-17 ENCOUNTER — Encounter: Payer: Self-pay | Admitting: Urology

## 2022-09-17 VITALS — BP 121/72 | HR 90 | Ht 71.0 in | Wt 225.0 lb

## 2022-09-17 DIAGNOSIS — N138 Other obstructive and reflux uropathy: Secondary | ICD-10-CM

## 2022-09-17 DIAGNOSIS — N35013 Post-traumatic anterior urethral stricture: Secondary | ICD-10-CM

## 2022-09-17 DIAGNOSIS — R319 Hematuria, unspecified: Secondary | ICD-10-CM

## 2022-09-17 DIAGNOSIS — R3129 Other microscopic hematuria: Secondary | ICD-10-CM | POA: Diagnosis not present

## 2022-09-17 DIAGNOSIS — N400 Enlarged prostate without lower urinary tract symptoms: Secondary | ICD-10-CM

## 2022-09-17 DIAGNOSIS — N401 Enlarged prostate with lower urinary tract symptoms: Secondary | ICD-10-CM | POA: Diagnosis not present

## 2022-09-17 DIAGNOSIS — N200 Calculus of kidney: Secondary | ICD-10-CM

## 2022-09-17 LAB — MICROSCOPIC EXAMINATION

## 2022-09-17 LAB — URINALYSIS, COMPLETE
Bilirubin, UA: NEGATIVE
Ketones, UA: NEGATIVE
Leukocytes,UA: NEGATIVE
Nitrite, UA: NEGATIVE
Protein,UA: NEGATIVE
RBC, UA: NEGATIVE
Specific Gravity, UA: 1.01 (ref 1.005–1.030)
Urobilinogen, Ur: 0.2 mg/dL (ref 0.2–1.0)
pH, UA: 5.5 (ref 5.0–7.5)

## 2022-09-17 LAB — BLADDER SCAN AMB NON-IMAGING: Scan Result: 20

## 2022-09-17 MED ORDER — GEMTESA 75 MG PO TABS
75.0000 mg | ORAL_TABLET | Freq: Every day | ORAL | 0 refills | Status: DC
Start: 2022-09-17 — End: 2022-10-30

## 2022-09-25 DIAGNOSIS — H18513 Endothelial corneal dystrophy, bilateral: Secondary | ICD-10-CM | POA: Diagnosis not present

## 2022-09-25 DIAGNOSIS — H40003 Preglaucoma, unspecified, bilateral: Secondary | ICD-10-CM | POA: Diagnosis not present

## 2022-09-25 DIAGNOSIS — E119 Type 2 diabetes mellitus without complications: Secondary | ICD-10-CM | POA: Diagnosis not present

## 2022-09-25 DIAGNOSIS — H2513 Age-related nuclear cataract, bilateral: Secondary | ICD-10-CM | POA: Diagnosis not present

## 2022-10-02 DIAGNOSIS — M539 Dorsopathy, unspecified: Secondary | ICD-10-CM | POA: Diagnosis not present

## 2022-10-02 DIAGNOSIS — M19042 Primary osteoarthritis, left hand: Secondary | ICD-10-CM | POA: Diagnosis not present

## 2022-10-02 DIAGNOSIS — M19041 Primary osteoarthritis, right hand: Secondary | ICD-10-CM | POA: Diagnosis not present

## 2022-10-02 DIAGNOSIS — M170 Bilateral primary osteoarthritis of knee: Secondary | ICD-10-CM | POA: Diagnosis not present

## 2022-10-17 NOTE — Telephone Encounter (Signed)
error 

## 2022-10-29 NOTE — Progress Notes (Signed)
10/30/2022 8:58 AM   Henry [name] [birth year] [id number]  Referring provider: Dorothey Baseman, MD [phone] [address] [place],  [place] [address]  Urological history: 1. 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  Nephrolithiasis -Left ESWL (2018)  -CT (2023) -bilateral renal stones up to 1.3 cm on the right and 0.9 cm on the left  3.  High risk hematuria -former smoker -Non-contrast CT (2023) -no malignancies noted -cysto (2023) - no malignancies noted   4. Urethral stricture -dilation of mid pendulous urethral strictures with Optilume (07/2022)   Chief Complaint  Patient presents with   Benign Prostatic Hypertrophy    HPI: Henry [name] is a 77 y.o. male who presents today for follow up.   Previous records reviewed.   At his visit on 09/17/2022, UA yellow clear, 3+ glucose, specific gravity 1.010, pH 5.5, 0-5 WBCs, 0-2 RBCs, 0-10 epithelial cells, mucus threads are present and a few bacteria.  PVR 20 mL  KUB cluster of stones in the upper pole of the right kidney with smaller stones in the lower pole, clusters of stones in the left upper mid and lower poles of the left kidney.  It appears stable compared to CT renal stone study from September 2023  I PSS 4/2  He is having more daytime frequency having to use the restroom every 2-3 hours.  He has nocturia x 1.  Patient denies any modifying or aggravating factors.  Patient denies any gross hematuria, dysuria or suprapubic/flank pain.  Patient denies any fevers, chills, nausea or vomiting.   KUB noted bilateral nephrolithiasis.  He was instructed to continue tamsulosin 0.4 mg daily and he was given Gemtesa 75 mg 6 weeks samples and instructed to follow-up in 6 weeks.  I PSS 6/1  PVR 40 mL   He states that the Gemtesa samples decreased his urinary frequency and he would like to continue them.  Patient denies any modifying or  aggravating factors.  Patient denies any recent UTI's, gross hematuria, dysuria or suprapubic/flank pain.  Patient denies any fevers, chills, nausea or vomiting.    IPSS     Row Name 10/30/22 0800         International Prostate Symptom Score   How often have you had the sensation of not emptying your bladder? Not at All     How often have you had to urinate less than every two hours? Less than half the time     How often have you found you stopped and started again several times when you urinated? Less than half the time     How often have you found it difficult to postpone urination? Less than 1 in 5 times     How often have you had a weak urinary stream? Not at All     How often have you had to strain to start urination? Not at All     How many times did you typically get up at night to urinate? 1 Time     Total IPSS Score 6       Quality of Life due to urinary symptoms   If you were to spend the rest of your life with your urinary condition just the way it is now how would you feel about that? Pleased             Score:  1-7 Mild 8-19 Moderate 20-35 Severe  PMH: Past Medical History:  Diagnosis Date   Acute ST elevation myocardial infarction (STEMI) of anterior wall (HCC)    a.) LHC/PCI 08/10/2017: 99% thrombotic occlusion p-mLAD (2.5 x 28 mm Sierra DES), 70% pLAD (2.5 x 12 mm Sierra DES); b.) s/p staged orbital athrectomy and PCI pRCA (3.5 x 24 mm Resolute Onyx) 08/13/2017   Anginal pain (HCC)    Arthritis    CAD (coronary artery disease)    a.) LHC/PCI 08/10/2017: 50%/99% pRCA, 30% dRCA, 30% p-mLCx, 70% OM1, 99% thrombotic occlusion p-mLAD (2.5 x 28 mm Sierra DES), 70% pLAD (2.5 x 12 mm Sierra DES); b.) s/p staged orbital athrectomy and PCI pRCA (3.5 x 24 mm Resolute Onyx) 08/13/2017; c.) MPI 03/27/2020: mild inferior ischemic; d.) MPI 08/02/2020: no ischemia   Chronic, continuous use of opioids    CKD (chronic kidney disease), stage II    COPD (chronic obstructive  pulmonary disease) (HCC)    Diabetic polyneuropathy (HCC)    Glaucoma    History of kidney stones    Hyperlipidemia    Hypertension    Ischemic cardiomyopathy    a.) TTE 08/10/17: technically difficult study, no diagnostic RWMA, EF 40-50%, poor windows, very difficult study; b.) TTE 12/25/2019: EF 60-65%, RAE, G1DD; c.) TTE 07/28/2020: EF 55-60%, triv TR   Skin cancer    Type 2 diabetes mellitus treated with insulin (HCC)    Urethral stricture     Surgical History: Past Surgical History:  Procedure Laterality Date   CARDIAC CATHETERIZATION     COLONOSCOPY WITH PROPOFOL N/A 12/08/2014   Procedure: COLONOSCOPY WITH PROPOFOL;  Surgeon: Scot Jun, MD;  Location: [facility] ENDOSCOPY;  Service: Endoscopy;  Laterality: N/A;   COLONOSCOPY WITH PROPOFOL N/A 01/10/2016   Procedure: COLONOSCOPY WITH PROPOFOL;  Surgeon: Scot Jun, MD;  Location: [facility] ENDOSCOPY;  Service: Endoscopy;  Laterality: N/A;   COLONOSCOPY WITH PROPOFOL N/A 04/28/2019   Procedure: COLONOSCOPY WITH PROPOFOL;  Surgeon: Earline Mayotte, MD;  Location: [facility] ENDOSCOPY;  Service: Endoscopy;  Laterality: N/A;   COLONOSCOPY WITH PROPOFOL N/A 03/17/2020   Procedure: COLONOSCOPY WITH PROPOFOL;  Surgeon: Earline Mayotte, MD;  Location: [facility] ENDOSCOPY;  Service: Endoscopy;  Laterality: N/A;   CORONARY ATHERECTOMY N/A 08/13/2017   Procedure: CORONARY ATHERECTOMY;  Surgeon: Iran Ouch, MD;  Location: [facility] INVASIVE CV LAB;  Service: Cardiovascular;  Laterality: N/A;   CORONARY STENT INTERVENTION N/A 08/13/2017   Procedure: CORONARY STENT INTERVENTION;  Surgeon: Iran Ouch, MD;  Location: [facility] INVASIVE CV LAB;  Service: Cardiovascular;  Laterality: N/A;   CORONARY/GRAFT ACUTE MI REVASCULARIZATION N/A 08/10/2017   Procedure: Coronary/Graft Acute MI Revascularization;  Surgeon: Kathleene Hazel, MD;  Location: [facility] INVASIVE CV LAB;  Service: Cardiovascular;  Laterality: N/A;   CYSTOSCOPY W/ RETROGRADES Bilateral 08/05/2022    Procedure: CYSTOSCOPY WITH RETROGRADE PYELOGRAM;  Surgeon: [name], MD;  Location: [facility] ORS;  Service: Urology;  Laterality: Bilateral;   CYSTOSCOPY WITH URETHRAL DILATATION N/A 08/05/2022   Procedure: CYSTOSCOPY WITH URETHRAL BALLOON  DILATATION WITH OPTILUME;  Surgeon: [name], MD;  Location: [facility] ORS;  Service: Urology;  Laterality: N/A;   EXTRACORPOREAL SHOCK WAVE LITHOTRIPSY Right 11/09/2015   Procedure: EXTRACORPOREAL SHOCK WAVE LITHOTRIPSY (ESWL);  Surgeon: Orson Ape, MD;  Location: [facility] ORS;  Service: Urology;  Laterality: Right;   EXTRACORPOREAL SHOCK WAVE LITHOTRIPSY Left 10/17/2016   Procedure: EXTRACORPOREAL SHOCK WAVE LITHOTRIPSY (ESWL);  Surgeon: Orson Ape, MD;  Location: [facility] ORS;  Service: Urology;  Laterality: Left;   LEFT  HEART CATH AND CORONARY ANGIOGRAPHY N/A 08/10/2017   Procedure: LEFT HEART CATH AND CORONARY ANGIOGRAPHY;  Surgeon: Kathleene Hazel, MD;  Location: [facility] INVASIVE CV LAB;  Service: Cardiovascular;  Laterality: N/A;   LITHOTRIPSY     x 6   PAROTIDECTOMY  2015   TEMPORARY PACEMAKER N/A 08/13/2017   Procedure: TEMPORARY PACEMAKER;  Surgeon: Iran Ouch, MD;  Location: [facility] CV LAB;  Service: Cardiovascular;  Laterality: N/A;   TONSILLECTOMY      Home Medications:  Allergies as of 10/30/2022       Reactions   Lisinopril Cough        Medication List        Accurate as of October 30, 2022  8:58 AM. If you have any questions, ask your nurse or doctor.          aspirin EC 81 MG tablet Take 1 tablet (81 mg total) by mouth daily.   atorvastatin 80 MG tablet Commonly known as: LIPITOR Take 1 tablet (80 mg total) by mouth daily at 6 PM. What changed: when to take this   cetirizine 10 MG tablet Commonly known as: ZYRTEC Take 10 mg by mouth daily.   COMBIVENT IN Inhale 1 puff into the lungs daily.   dextrose 40 % Gel Commonly known as: GLUTOSE Take by mouth.   diclofenac Sodium 1 % Gel Commonly known  as: VOLTAREN 2 g as needed.   DULoxetine 30 MG capsule Commonly known as: CYMBALTA Take 30 mg by mouth daily.   empagliflozin 25 MG Tabs tablet Commonly known as: JARDIANCE Take 25 mg by mouth daily.   fluticasone 50 MCG/ACT nasal spray Commonly known as: FLONASE Place 1 spray into both nostrils daily.   furosemide 20 MG tablet Commonly known as: LASIX Take 1 tablet (20 mg total) by mouth daily.   gabapentin 600 MG tablet Commonly known as: NEURONTIN Take 600 mg by mouth 3 (three) times daily.   gabapentin 300 MG capsule Commonly known as: NEURONTIN Take 300 mg by mouth at bedtime.   Gemtesa 75 MG Tabs Generic drug: Vibegron Take 1 tablet (75 mg total) by mouth daily. What changed: You were already taking a medication with the same name, and this prescription was added. Make sure you understand how and when to take each. Changed by: Michiel Cowboy   Gemtesa 75 MG Tabs Generic drug: Vibegron Take 1 tablet (75 mg total) by mouth daily. What changed: Another medication with the same name was added. Make sure you understand how and when to take each. Changed by: Michiel Cowboy   HYDROcodone-acetaminophen 5-325 MG tablet Commonly known as: NORCO/VICODIN Take 1 tablet by mouth 3 (three) times daily.   insulin glargine-yfgn 100 UNIT/ML injection Commonly known as: SEMGLEE 90 Units 2 (two) times daily.   irbesartan 150 MG tablet Commonly known as: AVAPRO Take 150 mg by mouth every evening.   metoprolol tartrate 25 MG tablet Commonly known as: LOPRESSOR Take 1 tablet (25 mg total) by mouth 2 (two) times daily.   montelukast 10 MG tablet Commonly known as: SINGULAIR Take 10 mg by mouth at bedtime.   multivitamin with minerals Tabs tablet Take 1 tablet by mouth daily.   nitroGLYCERIN 0.4 MG SL tablet Commonly known as: NITROSTAT PLACE 1 TABLET UNDER TONGUE EVERY 5 MIN AS NEEDED FOR CHEST PAIN IF NO RELIEF IN15 MIN CALL 911 (MAX 3 TABS)   oxybutynin 5 MG  tablet Commonly known as: DITROPAN Take 1 tablet (5 mg total) by  mouth every 8 (eight) hours as needed for bladder spasms.   sitaGLIPtin-metformin 50-1000 MG tablet Commonly known as: JANUMET Take 1 tablet by mouth 2 (two) times daily with a meal.   tamsulosin 0.4 MG Caps capsule Commonly known as: FLOMAX Take 0.4 mg by mouth daily.        Allergies:  Allergies  Allergen Reactions   Lisinopril Cough    Family History: Family History  Problem Relation Age of Onset   Heart attack Mother    Melanoma Father    Parkinsonism Father     Social History:  reports that he quit smoking about 5 years ago. His smoking use included cigarettes. He started smoking about 55 years ago. He has a 50 pack-year smoking history. He has never used smokeless tobacco. He reports that he does not drink alcohol and does not use drugs.  ROS: Pertinent ROS in HPI  Physical Exam: BP 125/72   Pulse 84   Wt 227 lb 1.6 oz (103 kg)   BMI 31.67 kg/m   Constitutional:  Well nourished. Alert and oriented, No acute distress. HEENT: [place] AT, moist mucus membranes.  Trachea midline Cardiovascular: No clubbing, cyanosis, or edema. Respiratory: Normal respiratory effort, no increased work of breathing. Neurologic: Grossly intact, no focal deficits, moving all 4 extremities. Psychiatric: Normal mood and affect.   Laboratory Data: N/A  Pertinent Imaging:  10/30/22 08:32  Scan Result 40ml    Assessment & Plan:    1. BPH with LU TS -Continue tamsulosin 0.4 mg daily  -Great improvement with his urinary frequency on Gemtesa -Will continue [place] -gave him more samples in the event that his insurance will not cover the medication and to give [place] time to either achieve a prior authorization or prescribe a different medication -He will follow-up in one year for IPSS and PVR  2.  Nephrolithiasis -KUB Bilateral nephrolithiasis -Asymptomatic at this time -KUB in one year  3. Urethral stricture -PVR  demonstrates adequate bladder emptying  4. High risk hematuria -former smoking -recent work up -bilateral nephrolithiasis and urethral stricture disease -no reports of gross heme  Return in about 1 year (around 10/30/2023) for KUB, UA, I PSS and PVR .  These notes generated with voice recognition software. I apologize for typographical errors.  Cloretta Ned  [facility] [address]  [address] [place], [place] [address] [phone]

## 2022-10-30 ENCOUNTER — Ambulatory Visit: Payer: Medicare HMO | Admitting: Urology

## 2022-10-30 ENCOUNTER — Encounter: Payer: Self-pay | Admitting: Urology

## 2022-10-30 VITALS — BP 125/72 | HR 84 | Wt 227.1 lb

## 2022-10-30 DIAGNOSIS — N401 Enlarged prostate with lower urinary tract symptoms: Secondary | ICD-10-CM | POA: Diagnosis not present

## 2022-10-30 DIAGNOSIS — N200 Calculus of kidney: Secondary | ICD-10-CM | POA: Diagnosis not present

## 2022-10-30 DIAGNOSIS — N35919 Unspecified urethral stricture, male, unspecified site: Secondary | ICD-10-CM

## 2022-10-30 DIAGNOSIS — R319 Hematuria, unspecified: Secondary | ICD-10-CM | POA: Diagnosis not present

## 2022-10-30 LAB — BLADDER SCAN AMB NON-IMAGING

## 2022-10-30 MED ORDER — GEMTESA 75 MG PO TABS
75.0000 mg | ORAL_TABLET | Freq: Every day | ORAL | 11 refills | Status: DC
Start: 2022-10-30 — End: 2024-01-28

## 2022-10-30 MED ORDER — GEMTESA 75 MG PO TABS
75.0000 mg | ORAL_TABLET | Freq: Every day | ORAL | Status: AC
Start: 2022-10-30 — End: ?

## 2022-11-07 DIAGNOSIS — R509 Fever, unspecified: Secondary | ICD-10-CM | POA: Diagnosis not present

## 2022-11-07 DIAGNOSIS — J029 Acute pharyngitis, unspecified: Secondary | ICD-10-CM | POA: Diagnosis not present

## 2022-11-07 DIAGNOSIS — R059 Cough, unspecified: Secondary | ICD-10-CM | POA: Diagnosis not present

## 2022-11-20 ENCOUNTER — Ambulatory Visit: Payer: Medicare HMO | Admitting: Student in an Organized Health Care Education/Training Program

## 2023-01-23 DIAGNOSIS — G63 Polyneuropathy in diseases classified elsewhere: Secondary | ICD-10-CM | POA: Diagnosis not present

## 2023-01-23 DIAGNOSIS — M79672 Pain in left foot: Secondary | ICD-10-CM | POA: Diagnosis not present

## 2023-01-23 DIAGNOSIS — M79642 Pain in left hand: Secondary | ICD-10-CM | POA: Diagnosis not present

## 2023-01-23 DIAGNOSIS — M79671 Pain in right foot: Secondary | ICD-10-CM | POA: Diagnosis not present

## 2023-01-23 DIAGNOSIS — M79641 Pain in right hand: Secondary | ICD-10-CM | POA: Diagnosis not present

## 2023-01-23 DIAGNOSIS — G5602 Carpal tunnel syndrome, left upper limb: Secondary | ICD-10-CM | POA: Diagnosis not present

## 2023-01-24 ENCOUNTER — Other Ambulatory Visit: Payer: Self-pay | Admitting: Urology

## 2023-01-24 ENCOUNTER — Other Ambulatory Visit: Payer: Self-pay | Admitting: *Deleted

## 2023-01-24 MED ORDER — TAMSULOSIN HCL 0.4 MG PO CAPS: 0.4000 mg | ORAL_CAPSULE | Freq: Every day | ORAL | 3 refills | Status: DC

## 2023-02-04 DIAGNOSIS — E785 Hyperlipidemia, unspecified: Secondary | ICD-10-CM | POA: Diagnosis not present

## 2023-02-04 DIAGNOSIS — E1129 Type 2 diabetes mellitus with other diabetic kidney complication: Secondary | ICD-10-CM | POA: Diagnosis not present

## 2023-02-04 DIAGNOSIS — Z794 Long term (current) use of insulin: Secondary | ICD-10-CM | POA: Diagnosis not present

## 2023-02-20 DIAGNOSIS — I10 Essential (primary) hypertension: Secondary | ICD-10-CM | POA: Diagnosis not present

## 2023-02-20 DIAGNOSIS — Z955 Presence of coronary angioplasty implant and graft: Secondary | ICD-10-CM | POA: Diagnosis not present

## 2023-02-20 DIAGNOSIS — R0602 Shortness of breath: Secondary | ICD-10-CM | POA: Diagnosis not present

## 2023-02-20 DIAGNOSIS — I2510 Atherosclerotic heart disease of native coronary artery without angina pectoris: Secondary | ICD-10-CM | POA: Diagnosis not present

## 2023-02-20 DIAGNOSIS — E785 Hyperlipidemia, unspecified: Secondary | ICD-10-CM | POA: Diagnosis not present

## 2023-02-20 DIAGNOSIS — J449 Chronic obstructive pulmonary disease, unspecified: Secondary | ICD-10-CM | POA: Diagnosis not present

## 2023-03-03 DIAGNOSIS — J449 Chronic obstructive pulmonary disease, unspecified: Secondary | ICD-10-CM | POA: Diagnosis not present

## 2023-03-03 DIAGNOSIS — E1140 Type 2 diabetes mellitus with diabetic neuropathy, unspecified: Secondary | ICD-10-CM | POA: Diagnosis not present

## 2023-03-03 DIAGNOSIS — E785 Hyperlipidemia, unspecified: Secondary | ICD-10-CM | POA: Diagnosis not present

## 2023-03-03 DIAGNOSIS — I10 Essential (primary) hypertension: Secondary | ICD-10-CM | POA: Diagnosis not present

## 2023-03-03 DIAGNOSIS — I2510 Atherosclerotic heart disease of native coronary artery without angina pectoris: Secondary | ICD-10-CM | POA: Diagnosis not present

## 2023-03-03 DIAGNOSIS — Z794 Long term (current) use of insulin: Secondary | ICD-10-CM | POA: Diagnosis not present

## 2023-03-03 DIAGNOSIS — Z0000 Encounter for general adult medical examination without abnormal findings: Secondary | ICD-10-CM | POA: Diagnosis not present

## 2023-03-03 DIAGNOSIS — Z1331 Encounter for screening for depression: Secondary | ICD-10-CM | POA: Diagnosis not present

## 2023-03-20 DIAGNOSIS — Z87891 Personal history of nicotine dependence: Secondary | ICD-10-CM | POA: Diagnosis not present

## 2023-03-20 DIAGNOSIS — J441 Chronic obstructive pulmonary disease with (acute) exacerbation: Secondary | ICD-10-CM | POA: Diagnosis not present

## 2023-03-20 DIAGNOSIS — J449 Chronic obstructive pulmonary disease, unspecified: Secondary | ICD-10-CM | POA: Diagnosis not present

## 2023-04-18 DIAGNOSIS — E1122 Type 2 diabetes mellitus with diabetic chronic kidney disease: Secondary | ICD-10-CM | POA: Diagnosis not present

## 2023-04-18 DIAGNOSIS — Z794 Long term (current) use of insulin: Secondary | ICD-10-CM | POA: Diagnosis not present

## 2023-04-18 DIAGNOSIS — N1831 Chronic kidney disease, stage 3a: Secondary | ICD-10-CM | POA: Diagnosis not present

## 2023-04-18 DIAGNOSIS — J449 Chronic obstructive pulmonary disease, unspecified: Secondary | ICD-10-CM | POA: Diagnosis not present

## 2023-06-23 DIAGNOSIS — M79671 Pain in right foot: Secondary | ICD-10-CM | POA: Diagnosis not present

## 2023-06-23 DIAGNOSIS — M79672 Pain in left foot: Secondary | ICD-10-CM | POA: Diagnosis not present

## 2023-06-23 DIAGNOSIS — M79642 Pain in left hand: Secondary | ICD-10-CM | POA: Diagnosis not present

## 2023-06-23 DIAGNOSIS — M79641 Pain in right hand: Secondary | ICD-10-CM | POA: Diagnosis not present

## 2023-06-23 DIAGNOSIS — G63 Polyneuropathy in diseases classified elsewhere: Secondary | ICD-10-CM | POA: Diagnosis not present

## 2023-08-21 DIAGNOSIS — I491 Atrial premature depolarization: Secondary | ICD-10-CM | POA: Diagnosis not present

## 2023-08-21 DIAGNOSIS — I10 Essential (primary) hypertension: Secondary | ICD-10-CM | POA: Diagnosis not present

## 2023-08-21 DIAGNOSIS — Z87891 Personal history of nicotine dependence: Secondary | ICD-10-CM | POA: Diagnosis not present

## 2023-08-21 DIAGNOSIS — I2510 Atherosclerotic heart disease of native coronary artery without angina pectoris: Secondary | ICD-10-CM | POA: Diagnosis not present

## 2023-08-21 DIAGNOSIS — Z136 Encounter for screening for cardiovascular disorders: Secondary | ICD-10-CM | POA: Diagnosis not present

## 2023-08-21 DIAGNOSIS — J449 Chronic obstructive pulmonary disease, unspecified: Secondary | ICD-10-CM | POA: Diagnosis not present

## 2023-08-21 DIAGNOSIS — I2109 ST elevation (STEMI) myocardial infarction involving other coronary artery of anterior wall: Secondary | ICD-10-CM | POA: Diagnosis not present

## 2023-08-21 DIAGNOSIS — Z955 Presence of coronary angioplasty implant and graft: Secondary | ICD-10-CM | POA: Diagnosis not present

## 2023-08-21 DIAGNOSIS — E785 Hyperlipidemia, unspecified: Secondary | ICD-10-CM | POA: Diagnosis not present

## 2023-09-18 DIAGNOSIS — L988 Other specified disorders of the skin and subcutaneous tissue: Secondary | ICD-10-CM | POA: Diagnosis not present

## 2023-09-18 DIAGNOSIS — C44329 Squamous cell carcinoma of skin of other parts of face: Secondary | ICD-10-CM | POA: Diagnosis not present

## 2023-09-18 DIAGNOSIS — L578 Other skin changes due to chronic exposure to nonionizing radiation: Secondary | ICD-10-CM | POA: Diagnosis not present

## 2023-09-18 DIAGNOSIS — C44629 Squamous cell carcinoma of skin of left upper limb, including shoulder: Secondary | ICD-10-CM | POA: Diagnosis not present

## 2023-09-18 DIAGNOSIS — L814 Other melanin hyperpigmentation: Secondary | ICD-10-CM | POA: Diagnosis not present

## 2023-09-18 DIAGNOSIS — Z85828 Personal history of other malignant neoplasm of skin: Secondary | ICD-10-CM | POA: Diagnosis not present

## 2023-10-30 ENCOUNTER — Ambulatory Visit: Payer: Self-pay | Admitting: Urology

## 2023-11-18 ENCOUNTER — Emergency Department
Admission: EM | Admit: 2023-11-18 | Discharge: 2023-11-18 | Disposition: A | Discharge status: Home / Self Care | Attending: Emergency Medicine | Admitting: Emergency Medicine

## 2023-11-18 ENCOUNTER — Emergency Department

## 2023-11-18 ENCOUNTER — Other Ambulatory Visit: Payer: Self-pay

## 2023-11-18 DIAGNOSIS — I6381 Other cerebral infarction due to occlusion or stenosis of small artery: Secondary | ICD-10-CM | POA: Diagnosis not present

## 2023-11-18 DIAGNOSIS — Y92009 Unspecified place in unspecified non-institutional (private) residence as the place of occurrence of the external cause: Secondary | ICD-10-CM | POA: Insufficient documentation

## 2023-11-18 DIAGNOSIS — J439 Emphysema, unspecified: Secondary | ICD-10-CM | POA: Diagnosis not present

## 2023-11-18 DIAGNOSIS — R079 Chest pain, unspecified: Secondary | ICD-10-CM | POA: Diagnosis not present

## 2023-11-18 DIAGNOSIS — E119 Type 2 diabetes mellitus without complications: Secondary | ICD-10-CM | POA: Insufficient documentation

## 2023-11-18 DIAGNOSIS — W19XXXA Unspecified fall, initial encounter: Secondary | ICD-10-CM

## 2023-11-18 DIAGNOSIS — S20219A Contusion of unspecified front wall of thorax, initial encounter: Secondary | ICD-10-CM | POA: Insufficient documentation

## 2023-11-18 DIAGNOSIS — Z23 Encounter for immunization: Secondary | ICD-10-CM | POA: Diagnosis not present

## 2023-11-18 DIAGNOSIS — S0990XA Unspecified injury of head, initial encounter: Secondary | ICD-10-CM | POA: Insufficient documentation

## 2023-11-18 DIAGNOSIS — I6782 Cerebral ischemia: Secondary | ICD-10-CM | POA: Diagnosis not present

## 2023-11-18 DIAGNOSIS — S61412A Laceration without foreign body of left hand, initial encounter: Secondary | ICD-10-CM

## 2023-11-18 DIAGNOSIS — S20212A Contusion of left front wall of thorax, initial encounter: Secondary | ICD-10-CM | POA: Diagnosis not present

## 2023-11-18 DIAGNOSIS — I10 Essential (primary) hypertension: Secondary | ICD-10-CM | POA: Diagnosis not present

## 2023-11-18 DIAGNOSIS — R55 Syncope and collapse: Secondary | ICD-10-CM | POA: Diagnosis not present

## 2023-11-18 DIAGNOSIS — I700 Atherosclerosis of aorta: Secondary | ICD-10-CM | POA: Diagnosis not present

## 2023-11-18 DIAGNOSIS — S299XXA Unspecified injury of thorax, initial encounter: Secondary | ICD-10-CM | POA: Diagnosis not present

## 2023-11-18 LAB — BASIC METABOLIC PANEL WITH GFR
Anion gap: 12 (ref 5–15)
BUN: 24 mg/dL — ABNORMAL HIGH (ref 8–23)
CO2: 27 mmol/L (ref 22–32)
Calcium: 9.8 mg/dL (ref 8.9–10.3)
Chloride: 96 mmol/L — ABNORMAL LOW (ref 98–111)
Creatinine, Ser: 1.4 mg/dL — ABNORMAL HIGH (ref 0.61–1.24)
GFR, Estimated: 51 mL/min — ABNORMAL LOW (ref >60)
Glucose, Bld: 210 mg/dL — ABNORMAL HIGH (ref 70–99)
Potassium: 4 mmol/L (ref 3.5–5.1)
Sodium: 135 mmol/L (ref 135–145)

## 2023-11-18 LAB — CBC WITH DIFFERENTIAL/PLATELET
Abs Immature Granulocytes: 0.03 K/uL (ref 0.00–0.07)
Basophils Absolute: 0.1 K/uL (ref 0.0–0.1)
Basophils Relative: 1 %
Eosinophils Absolute: 0.5 K/uL (ref 0.0–0.5)
Eosinophils Relative: 5 %
HCT: 44.5 % (ref 39.0–52.0)
Hemoglobin: 13.9 g/dL (ref 13.0–17.0)
Immature Granulocytes: 0 %
Lymphocytes Relative: 22 %
Lymphs Abs: 1.9 K/uL (ref 0.7–4.0)
MCH: 28 pg (ref 26.0–34.0)
MCHC: 31.2 g/dL (ref 30.0–36.0)
MCV: 89.5 fL (ref 80.0–100.0)
Monocytes Absolute: 1 K/uL (ref 0.1–1.0)
Monocytes Relative: 12 %
Neutro Abs: 5.2 K/uL (ref 1.7–7.7)
Neutrophils Relative %: 60 %
Platelets: 249 K/uL (ref 150–400)
RBC: 4.97 MIL/uL (ref 4.22–5.81)
RDW: 14.4 % (ref 11.5–15.5)
WBC: 8.7 K/uL (ref 4.0–10.5)
nRBC: 0 % (ref 0.0–0.2)

## 2023-11-18 LAB — TROPONIN I (HIGH SENSITIVITY): Troponin I (High Sensitivity): 5 ng/L (ref <18)

## 2023-11-18 MED ORDER — TETANUS-DIPHTH-ACELL PERTUSSIS 5-2.5-18.5 LF-MCG/0.5 IM SUSY
0.5000 mL | PREFILLED_SYRINGE | Freq: Once | INTRAMUSCULAR | Status: AC
  Administered 2023-11-18 (×2): 0.5 mL via INTRAMUSCULAR
  Filled 2023-11-18: qty 0.5

## 2023-11-18 NOTE — ED Triage Notes (Signed)
 First nurse note: Pt reports fall after syncopal episode. Pt unsure of head trauma. Pt reports CP.

## 2023-11-18 NOTE — ED Triage Notes (Signed)
 Pt c/o fall at home today after becoming dizzy with possible syncopal episode. Pt unsure if he hit his head. Pt has hx of COPD and diabetes. Pt c/o muscular chest pain from injury

## 2023-11-18 NOTE — Discharge Instructions (Addendum)
 Follow-up with your regular doctor as needed.  Return to emergency department worsening.  Take your regular medications as prescribed

## 2023-11-18 NOTE — ED Provider Notes (Signed)
 [facility] Provider Note    Event Date/Time   First MD Initiated Contact with Patient 11/18/23 1552     (approximate)   History   Fall   HPI  Henry [name] is a 77 y.o. male history of hypertension, hyperlipidemia, diabetes presents emergency department after fall at home.  Patient states he got up and began to get dizzy.  Has had 3 falls in the past few months.  Unsure if he had a syncopal episode.  Unsure if he hit his head.  States he know he hit his chest on the floor as he is having pain across the chest along the sternum.  No vomiting.  No other injuries reported.      Physical Exam   Triage Vital Signs: ED Triage Vitals  Encounter Vitals Group     BP 11/18/23 1315 124/70     Girls Systolic BP Percentile --      Girls Diastolic BP Percentile --      Boys Systolic BP Percentile --      Boys Diastolic BP Percentile --      Pulse Rate 11/18/23 1315 71     Resp 11/18/23 1315 16     Temp 11/18/23 1315 98.2 F (36.8 C)     Temp Source 11/18/23 1315 Oral     SpO2 11/18/23 1315 95 %     Weight 11/18/23 1316 221 lb (100.2 kg)     Height 11/18/23 1316 6' (1.829 m)     Head Circumference --      Peak Flow --      Pain Score 11/18/23 1316 8     Pain Loc --      Pain Education --      Exclude from Growth Chart --     Most recent vital signs: Vitals:   11/18/23 1315  BP: 124/70  Pulse: 71  Resp: 16  Temp: 98.2 F (36.8 C)  SpO2: 95%     General: Awake, no distress.   CV:  Good peripheral perfusion. regular rate and  rhythm Resp:  Normal effort. Lungs cta, sternum tender to palpation Abd:  No distention.  Abdomen nontender to palpation, no bruising noted Other:  Abrasion noted to the side of the left hand, small skin tear noted, extremities are nontender   ED Results / Procedures / Treatments   Labs (all labs ordered are listed, but only abnormal results are displayed) Labs Reviewed  BASIC METABOLIC PANEL WITH GFR -  Abnormal; Notable for the following components:      Result Value   Chloride 96 (*)    Glucose, Bld 210 (*)    BUN 24 (*)    Creatinine, Ser 1.40 (*)    GFR, Estimated 51 (*)    All other components within normal limits  CBC WITH DIFFERENTIAL/PLATELET  TROPONIN I (HIGH SENSITIVITY)     EKG  EKG   RADIOLOGY CT of the head, CT chest without contrast    PROCEDURES:   .Laceration Repair  Date/Time: 11/18/2023 4:42 PM  Performed by: Gasper Devere ORN, PA-C Authorized by: Gasper Devere ORN, PA-C   Consent:    Consent obtained:  Verbal   Consent given by:  Patient   Risks, benefits, and alternatives were discussed: yes     Risks discussed:  Infection, pain, retained foreign body, tendon damage, poor cosmetic result, need for additional repair, nerve damage, poor wound healing and vascular damage   Alternatives discussed:  No treatment Universal  protocol:    Procedure explained and questions answered to patient or proxy's satisfaction: yes     Immediately prior to procedure, a time out was called: yes     Patient identity confirmed:  Verbally with patient Anesthesia:    Anesthesia method:  None Laceration details:    Location:  Hand   Hand location:  L hand, dorsum   Length (cm):  1 Exploration:    Hemostasis achieved with:  Direct pressure   Imaging outcome: foreign body not noted     Wound exploration: wound explored through full range of motion and entire depth of wound visualized     Wound extent: areolar tissue not violated, fascia not violated, no foreign body, no signs of injury, no nerve damage, no tendon damage, no underlying fracture and no vascular damage   Treatment:    Area cleansed with:  Saline   Amount of cleaning:  Standard   Irrigation solution:  Sterile saline   Irrigation method:  Tap   Debridement:  None   Undermining:  None   Scar revision: no   Skin repair:    Repair method:  Tissue adhesive Approximation:    Approximation:  Close Repair  type:    Repair type:  Simple Post-procedure details:    Dressing:  Open (no dressing)   Procedure completion:  Tolerated well, no immediate complications   Critical Care: No Chief Complaint  Patient presents with   Fall      MEDICATIONS ORDERED IN ED: Medications  Tdap (BOOSTRIX) injection 0.5 mL (0.5 mLs Intramuscular Given 11/18/23 1702)     IMPRESSION / MDM / ASSESSMENT AND PLAN / ED COURSE  I reviewed the triage vital signs and the nursing notes.                              Differential diagnosis includes, but is not limited to, fall, subdural, SAH, MI, syncope, sternum fracture, laceration  Patient's presentation is most consistent with acute illness / injury with system symptoms.   Medications given: Tdap updated  EKG shows sinus rhythm with 73 bpm, PR interval at 138, appears to be normal, see physician read  CT of the head independently reviewed interpreted by me as being negative for acute abnormality  CT chest without contrast secondary to trauma and sternum pain  Basic metabolic panel reassuring although has elevation kidney function but is in patient's normal trend when I did review his past medical charts.  CBC reassuring awaiting troponin   Troponin reassuring  CT of the chest independently reviewed interpreted by me by reading radiologist report as being negative for any acute abnormality.  Did discuss this with patient.  He is to follow-up with his regular doctor.  Return emergency department worsening.  He is in agreement with follow-up treatment plan.  Discharged stable condition   FINAL CLINICAL IMPRESSION(S) / ED DIAGNOSES   Final diagnoses:  Fall, initial encounter  Contusion of chest wall, unspecified laterality, initial encounter  Laceration of left hand without foreign body, initial encounter  Minor head injury, initial encounter     Rx / DC Orders   ED Discharge Orders     None        Note:  This document was prepared using  Dragon voice recognition software and may include unintentional dictation errors.    Gasper Devere ORN, PA-C 11/18/23 DANIAL Willo Dunnings, MD 11/18/23 [phone]

## 2023-11-19 ENCOUNTER — Ambulatory Visit: Admitting: Urology

## 2023-11-20 ENCOUNTER — Ambulatory Visit: Admitting: Urology

## 2023-11-26 ENCOUNTER — Other Ambulatory Visit: Payer: Self-pay

## 2023-11-26 DIAGNOSIS — N200 Calculus of kidney: Secondary | ICD-10-CM

## 2023-11-28 DIAGNOSIS — Z136 Encounter for screening for cardiovascular disorders: Secondary | ICD-10-CM | POA: Diagnosis not present

## 2023-12-01 NOTE — Progress Notes (Unsigned)
 12/02/2023 7:56 PM   [name] 1945-06-04 [id number]  Referring provider: Glover Lenis, MD [phone] [place],  [place] [address]  Urological history: 1. 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  Nephrolithiasis -Left ESWL (2018)  -CT (2023) -bilateral renal stones up to 1.3 cm on the right and 0.9 cm on the left  3.  High risk hematuria -former smoker -Non-contrast CT (2023) -no malignancies noted -cysto (2023) - no malignancies noted   4. Urethral stricture -dilation of mid pendulous urethral strictures with Optilume (07/2022)   No chief complaint on file.   HPI: Henry [name] is a 78 y.o. man who presents today for follow up.   Previous records reviewed.   Serum creatinine at 1.40, EGFR 51.  Hemoglobin A1c was 8.6 in October.    UA ***  KUB ***    PMH: Past Medical History:  Diagnosis Date   Acute ST elevation myocardial infarction (STEMI) of anterior wall (HCC)    a.) LHC/PCI 08/10/2017: 99% thrombotic occlusion p-mLAD (2.5 x 28 mm Sierra DES), 70% pLAD (2.5 x 12 mm Sierra DES); b.) s/p staged orbital athrectomy and PCI pRCA (3.5 x 24 mm Resolute Onyx) 08/13/2017   Anginal pain (HCC)    Arthritis    CAD (coronary artery disease)    a.) LHC/PCI 08/10/2017: 50%/99% pRCA, 30% dRCA, 30% p-mLCx, 70% OM1, 99% thrombotic occlusion p-mLAD (2.5 x 28 mm Sierra DES), 70% pLAD (2.5 x 12 mm Sierra DES); b.) s/p staged orbital athrectomy and PCI pRCA (3.5 x 24 mm Resolute Onyx) 08/13/2017; c.) MPI 03/27/2020: mild inferior ischemic; d.) MPI 08/02/2020: no ischemia   Chronic, continuous use of opioids    CKD (chronic kidney disease), stage II    COPD (chronic obstructive pulmonary disease) (HCC)    Diabetic polyneuropathy (HCC)    Glaucoma    History of kidney stones    Hyperlipidemia    Hypertension    Ischemic cardiomyopathy    a.) TTE 08/10/17: technically  difficult study, no diagnostic RWMA, EF 40-50%, poor windows, very difficult study; b.) TTE 12/25/2019: EF 60-65%, RAE, G1DD; c.) TTE 07/28/2020: EF 55-60%, triv TR   Skin cancer    Type 2 diabetes mellitus treated with insulin  (HCC)    Urethral stricture     Surgical History: Past Surgical History:  Procedure Laterality Date   CARDIAC CATHETERIZATION     COLONOSCOPY WITH PROPOFOL  N/A 12/08/2014   Procedure: COLONOSCOPY WITH PROPOFOL ;  Surgeon: Lamar ONEIDA Holmes, MD;  Location: [facility] ENDOSCOPY;  Service: Endoscopy;  Laterality: N/A;   COLONOSCOPY WITH PROPOFOL  N/A 01/10/2016   Procedure: COLONOSCOPY WITH PROPOFOL ;  Surgeon: Lamar ONEIDA Holmes, MD;  Location: [facility] ENDOSCOPY;  Service: Endoscopy;  Laterality: N/A;   COLONOSCOPY WITH PROPOFOL  N/A 04/28/2019   Procedure: COLONOSCOPY WITH PROPOFOL ;  Surgeon: Dessa Reyes ORN, MD;  Location: [facility] ENDOSCOPY;  Service: Endoscopy;  Laterality: N/A;   COLONOSCOPY WITH PROPOFOL  N/A 03/17/2020   Procedure: COLONOSCOPY WITH PROPOFOL ;  Surgeon: Dessa Reyes ORN, MD;  Location: [facility] ENDOSCOPY;  Service: Endoscopy;  Laterality: N/A;   CORONARY ATHERECTOMY N/A 08/13/2017   Procedure: CORONARY ATHERECTOMY;  Surgeon: Darron Deatrice LABOR, MD;  Location: [facility] INVASIVE CV LAB;  Service: Cardiovascular;  Laterality: N/A;   CORONARY STENT INTERVENTION N/A 08/13/2017   Procedure: CORONARY STENT INTERVENTION;  Surgeon: Darron Deatrice LABOR, MD;  Location: [facility] INVASIVE CV LAB;  Service: Cardiovascular;  Laterality: N/A;   CORONARY/GRAFT ACUTE MI REVASCULARIZATION N/A 08/10/2017   Procedure: Coronary/Graft Acute MI Revascularization;  Surgeon: Verlin Lonni BIRCH, MD;  Location: [facility] INVASIVE CV LAB;  Service: Cardiovascular;  Laterality: N/A;   CYSTOSCOPY W/ RETROGRADES Bilateral 08/05/2022   Procedure: CYSTOSCOPY WITH RETROGRADE PYELOGRAM;  Surgeon: Penne Knee, MD;  Location: [facility] ORS;  Service: Urology;  Laterality: Bilateral;   CYSTOSCOPY WITH URETHRAL DILATATION N/A  08/05/2022   Procedure: CYSTOSCOPY WITH URETHRAL BALLOON  DILATATION WITH OPTILUME;  Surgeon: Penne Knee, MD;  Location: [facility] ORS;  Service: Urology;  Laterality: N/A;   EXTRACORPOREAL SHOCK WAVE LITHOTRIPSY Right 11/09/2015   Procedure: EXTRACORPOREAL SHOCK WAVE LITHOTRIPSY (ESWL);  Surgeon: Ozell JONELLE Burkes, MD;  Location: [facility] ORS;  Service: Urology;  Laterality: Right;   EXTRACORPOREAL SHOCK WAVE LITHOTRIPSY Left 10/17/2016   Procedure: EXTRACORPOREAL SHOCK WAVE LITHOTRIPSY (ESWL);  Surgeon: Burkes Ozell JONELLE, MD;  Location: [facility] ORS;  Service: Urology;  Laterality: Left;   LEFT HEART CATH AND CORONARY ANGIOGRAPHY N/A 08/10/2017   Procedure: LEFT HEART CATH AND CORONARY ANGIOGRAPHY;  Surgeon: Verlin Lonni BIRCH, MD;  Location: [facility] INVASIVE CV LAB;  Service: Cardiovascular;  Laterality: N/A;   LITHOTRIPSY     x 6   PAROTIDECTOMY  2015   TEMPORARY PACEMAKER N/A 08/13/2017   Procedure: TEMPORARY PACEMAKER;  Surgeon: Darron Deatrice LABOR, MD;  Location: [facility] INVASIVE CV LAB;  Service: Cardiovascular;  Laterality: N/A;   TONSILLECTOMY      Home Medications:  Allergies as of 12/02/2023       Reactions   Lisinopril Cough        Medication List        Accurate as of December 01, 2023  7:56 PM. If you have any questions, ask your nurse or doctor.          aspirin  EC 81 MG tablet Take 1 tablet (81 mg total) by mouth daily.   atorvastatin  80 MG tablet Commonly known as: LIPITOR  Take 1 tablet (80 mg total) by mouth daily at 6 PM. What changed: when to take this   cetirizine 10 MG tablet Commonly known as: ZYRTEC Take 10 mg by mouth daily.   COMBIVENT  IN Inhale 1 puff into the lungs daily.   dextrose  40 % Gel Commonly known as: GLUTOSE Take by mouth.   diclofenac Sodium 1 % Gel Commonly known as: VOLTAREN 2 g as needed.   DULoxetine 30 MG capsule Commonly known as: CYMBALTA Take 30 mg by mouth daily.   empagliflozin 25 MG Tabs tablet Commonly known as: JARDIANCE Take  25 mg by mouth daily.   fluticasone  50 MCG/ACT nasal spray Commonly known as: FLONASE  Place 1 spray into both nostrils daily.   furosemide  20 MG tablet Commonly known as: LASIX  Take 1 tablet (20 mg total) by mouth daily.   gabapentin  600 MG tablet Commonly known as: NEURONTIN  Take 600 mg by mouth 3 (three) times daily.   gabapentin  300 MG capsule Commonly known as: NEURONTIN  Take 300 mg by mouth at bedtime.   Gemtesa  75 MG Tabs Generic drug: Vibegron  Take 1 tablet (75 mg total) by mouth daily.   Gemtesa  75 MG Tabs Generic drug: Vibegron  Take 1 tablet (75 mg total) by mouth daily.   HYDROcodone -acetaminophen  5-325 MG tablet Commonly known as: NORCO/VICODIN Take 1 tablet by mouth 3 (three) times daily.   insulin  glargine-yfgn 100 UNIT/ML injection Commonly known as: SEMGLEE  90 Units 2 (two) times daily.   irbesartan  150 MG tablet Commonly known as: AVAPRO  Take  150 mg by mouth every evening.   metoprolol  tartrate 25 MG tablet Commonly known as: LOPRESSOR  Take 1 tablet (25 mg total) by mouth 2 (two) times daily.   montelukast  10 MG tablet Commonly known as: SINGULAIR  Take 10 mg by mouth at bedtime.   multivitamin with minerals Tabs tablet Take 1 tablet by mouth daily.   nitroGLYCERIN  0.4 MG SL tablet Commonly known as: NITROSTAT  PLACE 1 TABLET UNDER TONGUE EVERY 5 MIN AS NEEDED FOR CHEST PAIN IF NO RELIEF IN15 MIN CALL 911 (MAX 3 TABS)   oxybutynin  5 MG tablet Commonly known as: DITROPAN  Take 1 tablet (5 mg total) by mouth every 8 (eight) hours as needed for bladder spasms.   sitaGLIPtin-metformin 50-1000 MG tablet Commonly known as: JANUMET Take 1 tablet by mouth 2 (two) times daily with a meal.   tamsulosin  0.4 MG Caps capsule Commonly known as: FLOMAX  Take 1 capsule (0.4 mg total) by mouth daily.        Allergies:  Allergies  Allergen Reactions   Lisinopril Cough    Family History: Family History  Problem Relation Age of Onset   Heart  attack Mother    Melanoma Father    Parkinsonism Father     Social History:  reports that he quit smoking about 6 years ago. His smoking use included cigarettes. He started smoking about 56 years ago. He has a 50 pack-year smoking history. He has never used smokeless tobacco. He reports that he does not drink alcohol and does not use drugs.  ROS: Pertinent ROS in HPI  Physical Exam: There were no vitals taken for this visit.  Constitutional:  Well nourished. Alert and oriented, No acute distress. HEENT: [place] AT, moist mucus membranes.  Trachea midline, no masses. Cardiovascular: No clubbing, cyanosis, or edema. Respiratory: Normal respiratory effort, no increased work of breathing. GI: Abdomen is soft, non tender, non distended, no abdominal masses. Liver and spleen not palpable.  No hernias appreciated.  Stool sample for occult testing is not indicated.   GU: No CVA tenderness.  No bladder fullness or masses.  Patient with circumcised/uncircumcised phallus. ***Foreskin easily retracted***  Urethral meatus is patent.  No penile discharge. No penile lesions or rashes. Scrotum without lesions, cysts, rashes and/or edema.  Testicles are located scrotally bilaterally. No masses are appreciated in the testicles. Left and right epididymis are normal. Rectal: Patient with  normal sphincter tone. Anus and perineum without scarring or rashes. No rectal masses are appreciated. Prostate is approximately *** grams, *** nodules are appreciated. Seminal vesicles are normal. Skin: No rashes, bruises or suspicious lesions. Lymph: No cervical or inguinal adenopathy. Neurologic: Grossly intact, no focal deficits, moving all 4 extremities. Psychiatric: Normal mood and affect.   Laboratory Data: See EPIC and HPI I have reviewed the labs.  See HPI.     Pertinent Imaging: N/A  Assessment & Plan:    1. BPH with LU TS -continue tamsulosin  0.4 mg daily   2.  Nephrolithiasis -KUB Bilateral  nephrolithiasis -Asymptomatic at this time -KUB in one year  3. Urethral stricture -PVR demonstrates adequate bladder emptying  4. High risk hematuria -former smoking -recent work up -bilateral nephrolithiasis and urethral stricture disease -no reports of gross heme -UA ***  No follow-ups on file.  These notes generated with voice recognition software. I apologize for typographical errors.  CLOTILDA HELON RIGGERS  [facility] [address]  [address] [place], [place] [address] [phone]

## 2023-12-02 ENCOUNTER — Ambulatory Visit
Admission: RE | Admit: 2023-12-02 | Discharge: 2023-12-02 | Disposition: A | Discharge status: Home / Self Care | Source: Ambulatory Visit | Attending: Urology | Admitting: Urology

## 2023-12-02 ENCOUNTER — Encounter: Payer: Self-pay | Admitting: Urology

## 2023-12-02 ENCOUNTER — Ambulatory Visit
Admission: RE | Admit: 2023-12-02 | Discharge: 2023-12-02 | Disposition: A | Discharge status: Home / Self Care | Attending: Urology | Admitting: Urology

## 2023-12-02 ENCOUNTER — Ambulatory Visit (INDEPENDENT_AMBULATORY_CARE_PROVIDER_SITE_OTHER): Admitting: Urology

## 2023-12-02 VITALS — BP 114/72 | HR 106 | Ht 72.0 in | Wt 215.0 lb

## 2023-12-02 DIAGNOSIS — N200 Calculus of kidney: Secondary | ICD-10-CM | POA: Diagnosis not present

## 2023-12-02 DIAGNOSIS — N138 Other obstructive and reflux uropathy: Secondary | ICD-10-CM

## 2023-12-02 DIAGNOSIS — N35919 Unspecified urethral stricture, male, unspecified site: Secondary | ICD-10-CM

## 2023-12-02 DIAGNOSIS — R319 Hematuria, unspecified: Secondary | ICD-10-CM | POA: Diagnosis not present

## 2023-12-02 DIAGNOSIS — R3129 Other microscopic hematuria: Secondary | ICD-10-CM

## 2023-12-02 DIAGNOSIS — N401 Enlarged prostate with lower urinary tract symptoms: Secondary | ICD-10-CM | POA: Diagnosis not present

## 2023-12-02 LAB — MICROSCOPIC EXAMINATION

## 2023-12-02 LAB — URINALYSIS, COMPLETE
Bilirubin, UA: NEGATIVE
Ketones, UA: NEGATIVE
Leukocytes,UA: NEGATIVE
Nitrite, UA: NEGATIVE
Specific Gravity, UA: 1.01 (ref 1.005–1.030)
Urobilinogen, Ur: 0.2 mg/dL (ref 0.2–1.0)
pH, UA: 6 (ref 5.0–7.5)

## 2023-12-05 LAB — CULTURE, URINE COMPREHENSIVE

## 2023-12-08 ENCOUNTER — Ambulatory Visit: Payer: Self-pay | Admitting: Urology

## 2023-12-09 NOTE — Telephone Encounter (Signed)
 Pt returned call, I read message from [facility].  He is not having any UTI symptoms.  He is doing alright.

## 2023-12-09 NOTE — Progress Notes (Signed)
 Called patient and no answer left a message for patient to call back

## 2023-12-16 ENCOUNTER — Ambulatory Visit
Admission: RE | Admit: 2023-12-16 | Discharge: 2023-12-16 | Disposition: A | Discharge status: Home / Self Care | Source: Ambulatory Visit | Attending: Urology | Admitting: Urology

## 2023-12-16 DIAGNOSIS — K5730 Diverticulosis of large intestine without perforation or abscess without bleeding: Secondary | ICD-10-CM | POA: Diagnosis not present

## 2023-12-16 DIAGNOSIS — N281 Cyst of kidney, acquired: Secondary | ICD-10-CM | POA: Diagnosis not present

## 2023-12-16 DIAGNOSIS — N200 Calculus of kidney: Secondary | ICD-10-CM | POA: Diagnosis not present

## 2023-12-16 DIAGNOSIS — R3129 Other microscopic hematuria: Secondary | ICD-10-CM | POA: Insufficient documentation

## 2023-12-16 MED ORDER — IOHEXOL 350 MG/ML SOLN
100.0000 mL | Freq: Once | INTRAVENOUS | Status: AC | PRN
  Administered 2023-12-16: 100 mL via INTRAVENOUS

## 2023-12-18 DIAGNOSIS — Z860101 Personal history of adenomatous and serrated colon polyps: Secondary | ICD-10-CM | POA: Diagnosis not present

## 2023-12-18 DIAGNOSIS — R195 Other fecal abnormalities: Secondary | ICD-10-CM | POA: Diagnosis not present

## 2023-12-26 NOTE — Progress Notes (Signed)
Called patient and patient understood

## 2023-12-29 ENCOUNTER — Encounter: Payer: Self-pay | Admitting: Internal Medicine

## 2023-12-29 DIAGNOSIS — Z23 Encounter for immunization: Secondary | ICD-10-CM | POA: Diagnosis not present

## 2023-12-29 DIAGNOSIS — I2109 ST elevation (STEMI) myocardial infarction involving other coronary artery of anterior wall: Secondary | ICD-10-CM | POA: Diagnosis not present

## 2023-12-29 DIAGNOSIS — I10 Essential (primary) hypertension: Secondary | ICD-10-CM | POA: Diagnosis not present

## 2023-12-29 DIAGNOSIS — I2510 Atherosclerotic heart disease of native coronary artery without angina pectoris: Secondary | ICD-10-CM | POA: Diagnosis not present

## 2023-12-29 DIAGNOSIS — Z955 Presence of coronary angioplasty implant and graft: Secondary | ICD-10-CM | POA: Diagnosis not present

## 2023-12-29 DIAGNOSIS — Z87891 Personal history of nicotine dependence: Secondary | ICD-10-CM | POA: Diagnosis not present

## 2023-12-30 DIAGNOSIS — M79672 Pain in left foot: Secondary | ICD-10-CM | POA: Diagnosis not present

## 2023-12-30 DIAGNOSIS — G5602 Carpal tunnel syndrome, left upper limb: Secondary | ICD-10-CM | POA: Diagnosis not present

## 2023-12-30 DIAGNOSIS — G63 Polyneuropathy in diseases classified elsewhere: Secondary | ICD-10-CM | POA: Diagnosis not present

## 2023-12-30 DIAGNOSIS — M79671 Pain in right foot: Secondary | ICD-10-CM | POA: Diagnosis not present

## 2024-01-01 ENCOUNTER — Ambulatory Visit: Admitting: Urology

## 2024-01-06 NOTE — Progress Notes (Unsigned)
 01/07/2024 4:30 PM   [name] [birth year] [id number]  Referring provider: Glover Lenis, MD [phone] [place],  [place] [address]  Urological history: 1. 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  Nephrolithiasis -Left ESWL (2018)  -CT (2023) -bilateral renal stones up to 1.3 cm on the right and 0.9 cm on the left  3.  High risk hematuria -former smoker -Non-contrast CT (2023) -no malignancies noted -cysto (2023) - no malignancies noted   4. Urethral stricture -dilation of mid pendulous urethral strictures with Optilume (07/2022)   Chief Complaint  Patient presents with   BPH with obstruction/lower urinary tract symptoms   HPI: Henry [name] is a 78 y.o. man who presents today for CT urogram follow up.   Previous records reviewed.   He states for the last 3 days he has been having some urinary frequency and urgency.  He states his blood sugars are within the 115-120 range.  He has been drinking a lot of diet Cokes.  Patient denies any modifying or aggravating factors.  Patient denies any recent UTI's, gross hematuria, dysuria or suprapubic/flank pain.  Patient denies any fevers, chills, nausea or vomiting.    He used the restroom before the appointment, so he could not get a sample  CT urogram (12/16/2023) Numerous bilateral renal calculi including multiple clustered small calculi in the superior pole calices of the right kidney. No ureteral calculi or hydronephrosis. Prostatomegaly.   PVR 41 mL   PMH: Past Medical History:  Diagnosis Date   Acute ST elevation myocardial infarction (STEMI) of anterior wall (HCC)    a.) LHC/PCI 08/10/2017: 99% thrombotic occlusion p-mLAD (2.5 x 28 mm Sierra DES), 70% pLAD (2.5 x 12 mm Sierra DES); b.) s/p staged orbital athrectomy and PCI pRCA (3.5 x 24 mm Resolute Onyx) 08/13/2017   Anginal pain    Arthritis    Basal cell  carcinoma    CAD (coronary artery disease)    a.) LHC/PCI 08/10/2017: 50%/99% pRCA, 30% dRCA, 30% p-mLCx, 70% OM1, 99% thrombotic occlusion p-mLAD (2.5 x 28 mm Sierra DES), 70% pLAD (2.5 x 12 mm Sierra DES); b.) s/p staged orbital athrectomy and PCI pRCA (3.5 x 24 mm Resolute Onyx) 08/13/2017; c.) MPI 03/27/2020: mild inferior ischemic; d.) MPI 08/02/2020: no ischemia   Chronic, continuous use of opioids    CKD (chronic kidney disease), stage II    COPD (chronic obstructive pulmonary disease) (HCC)    Diabetic polyneuropathy (HCC)    Dyspnea    Glaucoma    History of kidney stones    Hyperlipidemia    Hypertension    Ischemic cardiomyopathy    a.) TTE 08/10/17: technically difficult study, no diagnostic RWMA, EF 40-50%, poor windows, very difficult study; b.) TTE 12/25/2019: EF 60-65%, RAE, G1DD; c.) TTE 07/28/2020: EF 55-60%, triv TR   Premature atrial contractions    Primary osteoarthritis of both hands    Skin cancer    Type 2 diabetes mellitus treated with insulin  (HCC)    Urethral stricture     Surgical History: Past Surgical History:  Procedure Laterality Date   CARDIAC CATHETERIZATION     COLONOSCOPY WITH PROPOFOL  N/A 12/08/2014   Procedure: COLONOSCOPY WITH PROPOFOL ;  Surgeon: Lamar ONEIDA Holmes, MD;  Location: [facility] ENDOSCOPY;  Service: Endoscopy;  Laterality: N/A;   COLONOSCOPY WITH PROPOFOL  N/A 01/10/2016   Procedure: COLONOSCOPY WITH PROPOFOL ;  Surgeon: Lamar T  Viktoria, MD;  Location: [facility] ENDOSCOPY;  Service: Endoscopy;  Laterality: N/A;   COLONOSCOPY WITH PROPOFOL  N/A 04/28/2019   Procedure: COLONOSCOPY WITH PROPOFOL ;  Surgeon: Dessa Reyes ORN, MD;  Location: [facility] ENDOSCOPY;  Service: Endoscopy;  Laterality: N/A;   COLONOSCOPY WITH PROPOFOL  N/A 03/17/2020   Procedure: COLONOSCOPY WITH PROPOFOL ;  Surgeon: Dessa Reyes ORN, MD;  Location: [facility] ENDOSCOPY;  Service: Endoscopy;  Laterality: N/A;   CORONARY ANGIOPLASTY     CORONARY ATHERECTOMY N/A 08/13/2017   Procedure:  CORONARY ATHERECTOMY;  Surgeon: Darron Deatrice LABOR, MD;  Location: [facility] INVASIVE CV LAB;  Service: Cardiovascular;  Laterality: N/A;   CORONARY STENT INTERVENTION N/A 08/13/2017   Procedure: CORONARY STENT INTERVENTION;  Surgeon: Darron Deatrice LABOR, MD;  Location: [facility] INVASIVE CV LAB;  Service: Cardiovascular;  Laterality: N/A;   CORONARY/GRAFT ACUTE MI REVASCULARIZATION N/A 08/10/2017   Procedure: Coronary/Graft Acute MI Revascularization;  Surgeon: Verlin Lonni BIRCH, MD;  Location: [facility] INVASIVE CV LAB;  Service: Cardiovascular;  Laterality: N/A;   CYSTOSCOPY W/ RETROGRADES Bilateral 08/05/2022   Procedure: CYSTOSCOPY WITH RETROGRADE PYELOGRAM;  Surgeon: Penne Knee, MD;  Location: [facility] ORS;  Service: Urology;  Laterality: Bilateral;   CYSTOSCOPY WITH URETHRAL DILATATION N/A 08/05/2022   Procedure: CYSTOSCOPY WITH URETHRAL BALLOON  DILATATION WITH OPTILUME;  Surgeon: Penne Knee, MD;  Location: [facility] ORS;  Service: Urology;  Laterality: N/A;   EXTRACORPOREAL SHOCK WAVE LITHOTRIPSY Right 11/09/2015   Procedure: EXTRACORPOREAL SHOCK WAVE LITHOTRIPSY (ESWL);  Surgeon: Ozell JONELLE Burkes, MD;  Location: [facility] ORS;  Service: Urology;  Laterality: Right;   EXTRACORPOREAL SHOCK WAVE LITHOTRIPSY Left 10/17/2016   Procedure: EXTRACORPOREAL SHOCK WAVE LITHOTRIPSY (ESWL);  Surgeon: Burkes Ozell JONELLE, MD;  Location: [facility] ORS;  Service: Urology;  Laterality: Left;   LEFT HEART CATH AND CORONARY ANGIOGRAPHY N/A 08/10/2017   Procedure: LEFT HEART CATH AND CORONARY ANGIOGRAPHY;  Surgeon: Verlin Lonni BIRCH, MD;  Location: [facility] INVASIVE CV LAB;  Service: Cardiovascular;  Laterality: N/A;   LITHOTRIPSY     x 6   PAROTIDECTOMY  2015   TEMPORARY PACEMAKER N/A 08/13/2017   Procedure: TEMPORARY PACEMAKER;  Surgeon: Darron Deatrice LABOR, MD;  Location: [facility] INVASIVE CV LAB;  Service: Cardiovascular;  Laterality: N/A;   TONSILLECTOMY      Home Medications:  Allergies as of 01/07/2024       Reactions    Lisinopril Cough        Medication List        Accurate as of January 07, 2024  4:30 PM. If you have any questions, ask your nurse or doctor.          aspirin  EC 81 MG tablet Take 1 tablet (81 mg total) by mouth daily.   atorvastatin  80 MG tablet Commonly known as: LIPITOR  Take 80 mg by mouth daily. What changed: Another medication with the same name was removed. Continue taking this medication, and follow the directions you see here. Changed by: CLOTILDA CORNWALL   cetirizine 10 MG tablet Commonly known as: ZYRTEC Take 10 mg by mouth daily.   COMBIVENT  IN Inhale 1 puff into the lungs daily.   dextrose  40 % Gel Commonly known as: GLUTOSE Take by mouth.   diclofenac Sodium 1 % Gel Commonly known as: VOLTAREN Apply topically. What changed: Another medication with the same name was removed. Continue taking this medication, and follow the directions you see here. Changed by: CLOTILDA CORNWALL   DULoxetine 60 MG capsule Commonly known as: CYMBALTA Take 60 mg by mouth. What changed: Another medication  with the same name was removed. Continue taking this medication, and follow the directions you see here. Changed by: CLOTILDA CORNWALL   empagliflozin 25 MG Tabs tablet Commonly known as: JARDIANCE Take 25 mg by mouth daily.   finasteride 5 MG tablet Commonly known as: PROSCAR Take 1 tablet (5 mg total) by mouth daily. Started by: Anaih Brander   fluticasone  50 MCG/ACT nasal spray Commonly known as: FLONASE  Place 1 spray into both nostrils daily.   furosemide  20 MG tablet Commonly known as: LASIX  Take 1 tablet (20 mg total) by mouth daily.   gabapentin  600 MG tablet Commonly known as: NEURONTIN  Take 600 mg by mouth 3 (three) times daily.   gabapentin  300 MG capsule Commonly known as: NEURONTIN  Take 300 mg by mouth at bedtime.   Gemtesa  75 MG Tabs Generic drug: Vibegron  Take 1 tablet (75 mg total) by mouth daily. What changed: Another medication with the  same name was added. Make sure you understand how and when to take each. Changed by: CLOTILDA CORNWALL   Gemtesa  75 MG Tabs Generic drug: Vibegron  Take 1 tablet (75 mg total) by mouth daily. What changed: Another medication with the same name was added. Make sure you understand how and when to take each. Changed by: CLOTILDA CORNWALL   Gemtesa  75 MG Tabs Generic drug: Vibegron  Take 1 tablet (75 mg total) by mouth daily. What changed: You were already taking a medication with the same name, and this prescription was added. Make sure you understand how and when to take each. Changed by: Tiberius Loftus   HYDROcodone -acetaminophen  5-325 MG tablet Commonly known as: NORCO/VICODIN Take 1 tablet by mouth 3 (three) times daily.   insulin  glargine-yfgn 100 UNIT/ML injection Commonly known as: SEMGLEE  90 Units 2 (two) times daily.   irbesartan  150 MG tablet Commonly known as: AVAPRO  Take 150 mg by mouth daily. What changed: Another medication with the same name was removed. Continue taking this medication, and follow the directions you see here. Changed by: Armella Stogner   Janumet 50-1000 MG tablet Generic drug: sitaGLIPtin-metformin Take 1 tablet by mouth. What changed: Another medication with the same name was removed. Continue taking this medication, and follow the directions you see here. Changed by: CLOTILDA CORNWALL   Lantus  SoloStar 100 UNIT/ML Solostar Pen Generic drug: insulin  glargine Inject 47 Units into the skin.   metoprolol  tartrate 25 MG tablet Commonly known as: LOPRESSOR  Take 1 tablet (25 mg total) by mouth 2 (two) times daily.   metoprolol  tartrate 25 MG tablet Commonly known as: LOPRESSOR  Take by mouth.   montelukast  10 MG tablet Commonly known as: SINGULAIR  Take 10 mg by mouth. What changed: Another medication with the same name was removed. Continue taking this medication, and follow the directions you see here. Changed by: Jeanelle Dake   multivitamin  with minerals Tabs tablet Take 1 tablet by mouth daily.   Na Sulfate-K Sulfate-Mg Sulfate concentrate 17.5-3.13-1.6 GM/177ML Soln Commonly known as: SUPREP Take 1 Bottle by mouth.   nitroGLYCERIN  0.4 MG SL tablet Commonly known as: NITROSTAT  PLACE 1 TABLET UNDER TONGUE EVERY 5 MIN AS NEEDED FOR CHEST PAIN IF NO RELIEF IN15 MIN CALL 911 (MAX 3 TABS)   oxybutynin  5 MG tablet Commonly known as: DITROPAN  Take 1 tablet (5 mg total) by mouth every 8 (eight) hours as needed for bladder spasms.   RSV bivalent vaccine 120 MCG/0.5ML injection Commonly known as: ABRYSVO Inject into the muscle.   tamsulosin  0.4 MG Caps capsule Commonly known as: FLOMAX   Take 1 capsule (0.4 mg total) by mouth daily.        Allergies:  Allergies  Allergen Reactions   Lisinopril Cough    Family History: Family History  Problem Relation Age of Onset   Heart attack Mother    Melanoma Father    Parkinsonism Father     Social History:  reports that he quit smoking about 6 years ago. His smoking use included cigarettes. He started smoking about 56 years ago. He has a 50 pack-year smoking history. He has been exposed to tobacco smoke. He has never used smokeless tobacco. He reports that he does not drink alcohol and does not use drugs.  ROS: Pertinent ROS in HPI  Physical Exam: BP 115/68 (BP Location: Left Arm, Patient Position: Sitting, Cuff Size: Large)   Pulse 89   Ht 6' (1.829 m)   Wt 215 lb 12.8 oz (97.9 kg)   BMI 29.27 kg/m   Constitutional:  Well nourished. Alert and oriented, No acute distress. HEENT: [place] AT, moist mucus membranes.  Trachea midline Cardiovascular: No clubbing, cyanosis, or edema. Respiratory: Normal respiratory effort, no increased work of breathing. Neurologic: Grossly intact, no focal deficits, moving all 4 extremities. Psychiatric: Normal mood and affect.   Laboratory Data: See EPIC and HPI I have reviewed the labs.  See HPI.     Pertinent Imaging: Narrative &  Impression  CLINICAL DATA:  Microscopic hematuria, BPH, nephrolithiasis * Tracking Code: BO *   EXAM: CT ABDOMEN AND PELVIS WITHOUT AND WITH CONTRAST   TECHNIQUE: Multidetector CT imaging of the abdomen and pelvis was performed following the standard protocol before and following the bolus administration of intravenous contrast.   RADIATION DOSE REDUCTION: This exam was performed according to the departmental dose-optimization program which includes automated exposure control, adjustment of the mA and/or kV according to patient size and/or use of iterative reconstruction technique.   CONTRAST:  OMNIPAQUE  IOHEXOL  350 MG/ML SOLN   COMPARISON:  12/20/2021   FINDINGS: Lower chest: No acute abnormality. Coronary artery calcifications. Bronchial wall thickening and scattered ground-glass airspace opacities throughout the bilateral lung bases. Probable emphysema.   Hepatobiliary: No solid liver abnormality is seen. No gallstones, gallbladder wall thickening, or biliary dilatation.   Pancreas: Unremarkable. No pancreatic ductal dilatation or surrounding inflammatory changes.   Spleen: Normal in size without significant abnormality.   Adrenals/Urinary Tract: Adrenal glands are unremarkable. Numerous bilateral renal calculi including many clustered small calculi in the superior pole calices of the right kidney (series 6, image 72). Right-sided parapelvic renal cyst, which does not reflect hydronephrosis. No ureteral calculi or hydronephrosis. No urinary tract filling defect on delayed phase imaging. Bladder is unremarkable.   Stomach/Bowel: Stomach is within normal limits. Appendix appears normal. No evidence of bowel wall thickening, distention, or inflammatory changes. Descending and sigmoid diverticulosis.   Vascular/Lymphatic: Severe aortic atherosclerosis. No enlarged abdominal or pelvic lymph nodes.   Reproductive: Prostatomegaly.   Other: Fat containing bilateral  inguinal hernias.  No ascites.   Musculoskeletal: No acute or significant osseous findings.   IMPRESSION: 1. Numerous bilateral renal calculi including multiple clustered small calculi in the superior pole calices of the right kidney. No ureteral calculi or hydronephrosis. 2. No urinary tract mass or suspicious contrast enhancement. No urinary tract filling defect on delayed phase imaging. 3. Prostatomegaly. 4. Descending and sigmoid diverticulosis without evidence of acute diverticulitis. 5. Bronchial wall thickening and scattered ground-glass airspace opacities throughout the bilateral lung bases, consistent with infection or aspiration. 6. Coronary artery  disease.   Aortic Atherosclerosis (ICD10-I70.0) and Emphysema (ICD10-J43.9).     Electronically Signed   By: Marolyn JONETTA Jaksch M.D.   On: 12/18/2023 21:13      01/07/24 16:27  Scan Result 41ml    I have independently reviewed the films.  See HPI.    Assessment & Plan:    1. BPH with LU TS -continue tamsulosin  0.4 mg daily  - Will add finasteride 5 mg daily as he is concerned that his prostate is enlarged and will like to decrease in size  2.  Nephrolithiasis -bilateral nephrolithiasis on  -He does not want to pursue treatment for the stones at this time - Will continue to monitor  3. Urethral stricture - still voiding with a strong stream and no feelings of incomplete bladder emptying  4. High risk hematuria -CTU (12/2023) with clusters of stones bilaterally, no hydronephrosis and a right renal pelvic cyst -no report of gross heme  5. Urgency - Will start Gemtesa  75 mg daily, #28 samples are given to see if this will control his urgency - We will also check a urine specimen when he returns in 1 month  Return in about 1 month (around 02/07/2024) for UA, I PSS, PVR .  These notes generated with voice recognition software. I apologize for typographical errors.  CLOTILDA HELON RIGGERS  [facility] [address]  [address] [place], [place] [address] [phone]

## 2024-01-07 ENCOUNTER — Encounter: Payer: Self-pay | Admitting: Urology

## 2024-01-07 ENCOUNTER — Ambulatory Visit: Admitting: Urology

## 2024-01-07 VITALS — BP 115/68 | HR 89 | Ht 72.0 in | Wt 215.8 lb

## 2024-01-07 DIAGNOSIS — N138 Other obstructive and reflux uropathy: Secondary | ICD-10-CM

## 2024-01-07 DIAGNOSIS — R3915 Urgency of urination: Secondary | ICD-10-CM | POA: Diagnosis not present

## 2024-01-07 DIAGNOSIS — N200 Calculus of kidney: Secondary | ICD-10-CM

## 2024-01-07 DIAGNOSIS — N401 Enlarged prostate with lower urinary tract symptoms: Secondary | ICD-10-CM

## 2024-01-07 DIAGNOSIS — R319 Hematuria, unspecified: Secondary | ICD-10-CM | POA: Diagnosis not present

## 2024-01-07 LAB — BLADDER SCAN AMB NON-IMAGING

## 2024-01-07 MED ORDER — FINASTERIDE 5 MG PO TABS: 5.0000 mg | ORAL_TABLET | Freq: Every day | ORAL | 3 refills | Status: AC

## 2024-01-07 MED ORDER — GEMTESA 75 MG PO TABS: 75.0000 mg | ORAL_TABLET | Freq: Every day | ORAL | Status: AC

## 2024-01-13 DIAGNOSIS — I2510 Atherosclerotic heart disease of native coronary artery without angina pectoris: Secondary | ICD-10-CM | POA: Diagnosis not present

## 2024-01-13 DIAGNOSIS — I2109 ST elevation (STEMI) myocardial infarction involving other coronary artery of anterior wall: Secondary | ICD-10-CM | POA: Diagnosis not present

## 2024-01-13 DIAGNOSIS — Z950 Presence of cardiac pacemaker: Secondary | ICD-10-CM | POA: Diagnosis not present

## 2024-01-13 DIAGNOSIS — Z955 Presence of coronary angioplasty implant and graft: Secondary | ICD-10-CM | POA: Diagnosis not present

## 2024-01-22 ENCOUNTER — Encounter: Payer: Self-pay | Admitting: Nurse Practitioner

## 2024-01-22 ENCOUNTER — Ambulatory Visit: Attending: Student in an Organized Health Care Education/Training Program | Admitting: Nurse Practitioner

## 2024-01-22 VITALS — BP 154/118 | HR 83 | Temp 97.7°F | Resp 16 | Ht 71.5 in | Wt 215.0 lb

## 2024-01-22 DIAGNOSIS — M170 Bilateral primary osteoarthritis of knee: Secondary | ICD-10-CM | POA: Insufficient documentation

## 2024-01-22 DIAGNOSIS — G894 Chronic pain syndrome: Secondary | ICD-10-CM | POA: Diagnosis not present

## 2024-01-22 DIAGNOSIS — E1140 Type 2 diabetes mellitus with diabetic neuropathy, unspecified: Secondary | ICD-10-CM | POA: Diagnosis not present

## 2024-01-22 DIAGNOSIS — Z7984 Long term (current) use of oral hypoglycemic drugs: Secondary | ICD-10-CM

## 2024-01-22 NOTE — Patient Instructions (Signed)

## 2024-01-22 NOTE — Progress Notes (Signed)
 PROVIDER NOTE: Interpretation of information contained herein should be left to medically-trained personnel. Specific patient instructions are provided elsewhere under Patient Instructions section of medical record. This document was created in part using AI and STT-dictation technology, any transcriptional errors that may result from this process are unintentional.  Patient: Henry [name]  Service: E/M   PCP: Henry Lenis, MD  DOB: Dec 09, 1945  DOS: 01/22/2024  Provider: Emmy MARLA Blanch, NP  MRN: [id number]  Delivery: Face-to-face  Specialty: Interventional Pain Management  Type: Established Patient  Setting: Ambulatory outpatient facility  Specialty designation: 09  Referring Prov.: Henry Lenis, MD  Location: Outpatient office facility       History of present illness (HPI) [name], a 78 y.o. year old male, is here today because of his Chronic painful diabetic neuropathy (HCC) [E11.40]. [name] primary complain today is Peripheral Neuropathy  Pertinent problems: [name] Chronic painful diabetic neuropathy (HCC); Acquired trigger finger of left ring finger; Left hand pain; Chronic pain syndrome; and Bilateral primary osteoarthritis of knee on their pertinent problem list.  Pain Assessment: Severity of Neuropathic pain is reported as a 8 /10. Location: Foot Right, Left/toes tingle bilat - to knees - also c/o b/l hand numbness. Onset: More than a month ago. Quality: Numbness, Tingling. Timing: Constant. Modifying factor(s): hydrocodone /APAP. Vitals:  height is 5' 11.5 (1.816 m) and weight is 215 lb (97.5 kg). His temperature is 97.7 F (36.5 C). His blood pressure is 154/118 (abnormal) and his pulse is 83. His respiration is 16 and oxygen  saturation is 100%.  BMI: Estimated body mass index is 29.57 kg/m as calculated from the following:   Height as of this encounter: 5' 11.5 (1.816 m).   Weight as of this encounter: 215 lb (97.5 kg).  Last encounter: Visit  date not found. Last procedure: Visit date not found.  Reason for encounter: evaluation for possible interventional PM therapy/treatment.   Discussed the use of AI scribe software for clinical note transcription with the patient, who gave verbal consent to proceed.  History of Present Illness   Henry [name] is a 78 year old male with neuropathy who presents for management of neuropathic pain.  The patient continues experiencing bilateral neuropathic pain.  Of note, he received Qutenza  treatment on Aug 21, 2022, which provided some relief.  Having of flareups in his bilateral feet he expressed interest in repeating Qutenza  treatment.   He experiences severe neuropathic pain primarily affecting his feet, with particular intensity in the mornings. The pain is persistent and significantly impacts his daily life.  He has previously undergone two Qutenza  treatments, which provided minimal relief.  His current medication regimen includes gabapentin , which he takes at a dose of 600 mg three times daily and an additional 300 mg at bedtime to aid sleep. He also takes Cymbalta and hydrocodone , which he finds effective in calming the pain.  He is covered by Medicare through [employer] and participates in [employer] on Wednesday and Thursday mornings.     Pharmacotherapy Assessment   Monitoring: [place] PMP: PDMP reviewed during this encounter.       Pharmacotherapy: No side-effects or adverse reactions reported. Compliance: No problems identified. Effectiveness: Clinically acceptable.  Henry Pulling, RN  01/22/2024  2:40 PM  Sign when Signing Visit Safety precautions to be maintained throughout the outpatient stay will include: orient to surroundings, keep bed in low position, maintain call bell within reach at all times, provide assistance with transfer out of bed and  ambulation.   UDS:  No results found for: SUMMARY  No results found for: CBDTHCR No results found for: D8THCCBX No  results found for: D9THCCBX  ROS  Constitutional: Denies any fever or chills Gastrointestinal: No reported hemesis, hematochezia, vomiting, or acute GI distress Musculoskeletal: Bilateral feet pain Neurological: No reported episodes of acute onset apraxia, aphasia, dysarthria, agnosia, amnesia, paralysis, loss of coordination, or loss of consciousness  Medication Review  DULoxetine, HYDROcodone -acetaminophen , Ipratropium-Albuterol , Na Sulfate-K Sulfate-Mg Sulfate concentrate, RSV bivalent vaccine, Vibegron , aspirin  EC, atorvastatin , cetirizine, dextrose , diclofenac Sodium, empagliflozin, finasteride, fluticasone , furosemide , gabapentin , insulin  glargine, insulin  glargine-yfgn, irbesartan , metoprolol  tartrate, montelukast , multivitamin with minerals, nitroGLYCERIN , oxybutynin , sitaGLIPtin-metformin, and tamsulosin   History Review  Allergy: Henry [name] is allergic to lisinopril. Drug: Henry [name]  reports no history of drug use. Alcohol:  reports no history of alcohol use. Tobacco:  reports that he quit smoking about 6 years ago. His smoking use included cigarettes. He started smoking about 56 years ago. He has a 50 pack-year smoking history. He has been exposed to tobacco smoke. He has never used smokeless tobacco. Social: Henry [name]  reports that he quit smoking about 6 years ago. His smoking use included cigarettes. He started smoking about 56 years ago. He has a 50 pack-year smoking history. He has been exposed to tobacco smoke. He has never used smokeless tobacco. He reports that he does not drink alcohol and does not use drugs. Medical:  has a past medical history of Acute ST elevation myocardial infarction (STEMI) of anterior wall (HCC), Anginal pain, Arthritis, Basal cell carcinoma, CAD (coronary artery disease), Chronic, continuous use of opioids, CKD (chronic kidney disease), stage II, COPD (chronic obstructive pulmonary disease) (HCC), Diabetic polyneuropathy (HCC), Dyspnea,  Glaucoma, History of kidney stones, Hyperlipidemia, Hypertension, Ischemic cardiomyopathy, Premature atrial contractions, Primary osteoarthritis of both hands, Skin cancer, Type 2 diabetes mellitus treated with insulin  (HCC), and Urethral stricture. Surgical: Henry [name]  has a past surgical history that includes Lithotripsy; Colonoscopy with propofol  (N/A, 12/08/2014); Parotidectomy (2015); Extracorporeal shock wave lithotripsy (Right, 11/09/2015); Tonsillectomy; Colonoscopy with propofol  (N/A, 01/10/2016); Extracorporeal shock wave lithotripsy (Left, 10/17/2016); Coronary/Graft Acute MI Revascularization (N/A, 08/10/2017); LEFT HEART CATH AND CORONARY ANGIOGRAPHY (N/A, 08/10/2017); CORONARY ATHERECTOMY (N/A, 08/13/2017); CORONARY STENT INTERVENTION (N/A, 08/13/2017); TEMPORARY PACEMAKER (N/A, 08/13/2017); Cardiac catheterization; Colonoscopy with propofol  (N/A, 04/28/2019); Colonoscopy with propofol  (N/A, 03/17/2020); Cystoscopy with urethral dilatation (N/A, 08/05/2022); Cystoscopy w/ retrogrades (Bilateral, 08/05/2022); and Coronary angioplasty. Family: family history includes Heart attack in his mother; Melanoma in his father; Parkinsonism in his father.  Laboratory Chemistry Profile   Renal Lab Results  Component Value Date   BUN 24 (H) 11/18/2023   CREATININE 1.40 (H) 11/18/2023   GFRAA 57 (L) 12/25/2019   GFRNONAA 51 (L) 11/18/2023    Hepatic Lab Results  Component Value Date   AST 33 12/20/2021   ALT 30 12/20/2021   ALBUMIN 4.3 12/20/2021   ALKPHOS 65 12/20/2021   LIPASE 54 (H) 08/02/2020    Electrolytes Lab Results  Component Value Date   NA 135 11/18/2023   K 4.0 11/18/2023   CL 96 (L) 11/18/2023   CALCIUM  9.8 11/18/2023   MG 2.3 08/11/2017   PHOS 2.3 (L) 08/11/2017    Bone No results found for: VD25OH, [id number], CI6874NY7, CI7874NY7, 25OHVITD1, 25OHVITD2, 25OHVITD3, TESTOFREE, TESTOSTERONE  Inflammation (CRP: Acute Phase) (ESR: Chronic Phase) Lab  Results  Component Value Date   LATICACIDVEN 2.9 (HH) 02/11/2021         Note: Above Lab results reviewed.  Recent Imaging  Review  CT HEMATURIA WORKUP CLINICAL DATA:  Microscopic hematuria, BPH, nephrolithiasis * Tracking Code: BO *  EXAM: CT ABDOMEN AND PELVIS WITHOUT AND WITH CONTRAST  TECHNIQUE: Multidetector CT imaging of the abdomen and pelvis was performed following the standard protocol before and following the bolus administration of intravenous contrast.  RADIATION DOSE REDUCTION: This exam was performed according to the departmental dose-optimization program which includes automated exposure control, adjustment of the mA and/or kV according to patient size and/or use of iterative reconstruction technique.  CONTRAST:  OMNIPAQUE  IOHEXOL  350 MG/ML SOLN  COMPARISON:  12/20/2021  FINDINGS: Lower chest: No acute abnormality. Coronary artery calcifications. Bronchial wall thickening and scattered ground-glass airspace opacities throughout the bilateral lung bases. Probable emphysema.  Hepatobiliary: No solid liver abnormality is seen. No gallstones, gallbladder wall thickening, or biliary dilatation.  Pancreas: Unremarkable. No pancreatic ductal dilatation or surrounding inflammatory changes.  Spleen: Normal in size without significant abnormality.  Adrenals/Urinary Tract: Adrenal glands are unremarkable. Numerous bilateral renal calculi including many clustered small calculi in the superior pole calices of the right kidney (series 6, image 72). Right-sided parapelvic renal cyst, which does not reflect hydronephrosis. No ureteral calculi or hydronephrosis. No urinary tract filling defect on delayed phase imaging. Bladder is unremarkable.  Stomach/Bowel: Stomach is within normal limits. Appendix appears normal. No evidence of bowel wall thickening, distention, or inflammatory changes. Descending and sigmoid diverticulosis.  Vascular/Lymphatic: Severe  aortic atherosclerosis. No enlarged abdominal or pelvic lymph nodes.  Reproductive: Prostatomegaly.  Other: Fat containing bilateral inguinal hernias.  No ascites.  Musculoskeletal: No acute or significant osseous findings.  IMPRESSION: 1. Numerous bilateral renal calculi including multiple clustered small calculi in the superior pole calices of the right kidney. No ureteral calculi or hydronephrosis. 2. No urinary tract mass or suspicious contrast enhancement. No urinary tract filling defect on delayed phase imaging. 3. Prostatomegaly. 4. Descending and sigmoid diverticulosis without evidence of acute diverticulitis. 5. Bronchial wall thickening and scattered ground-glass airspace opacities throughout the bilateral lung bases, consistent with infection or aspiration. 6. Coronary artery disease.  Aortic Atherosclerosis (ICD10-I70.0) and Emphysema (ICD10-J43.9).  Electronically Signed   By: Marolyn JONETTA Jaksch M.D.   On: 12/18/2023 21:13 Note: Reviewed        Physical Exam  Vitals: BP (!) 154/118   Pulse 83   Temp 97.7 F (36.5 C)   Resp 16   Ht 5' 11.5 (1.816 m)   Wt 215 lb (97.5 kg)   SpO2 100%   BMI 29.57 kg/m  BMI: Estimated body mass index is 29.57 kg/m as calculated from the following:   Height as of this encounter: 5' 11.5 (1.816 m).   Weight as of this encounter: 215 lb (97.5 kg). Ideal: Ideal body weight: 76.5 kg (168 lb 8.7 oz) Adjusted ideal body weight: 84.9 kg (187 lb 2 oz) General appearance: Well nourished, well developed, and well hydrated. In no apparent acute distress Mental status: Alert, oriented x 3 (person, place, & time)       Respiratory: No evidence of acute respiratory distress Eyes: PERLA  Musculoskeletal: Bilateral foot pain  Assessment   Diagnosis Status  1. Chronic painful diabetic neuropathy (HCC)   2. Chronic pain syndrome   3. Bilateral primary osteoarthritis of knee    Controlled Controlled Controlled   Updated Problems: No  problems updated.  Plan of Care  Problem-specific:  Assessment and Plan    Peripheral neuropathy with neuropathic pain Chronic peripheral neuropathy with severe neuropathic pain in feet. Previous treatments include Qutenza ,  gabapentin , Cymbalta, and hydrocodone . Neurologist indicated treatment options are exhausted. Repeating Qutenza  treatment for potential benefit. - Schedule Qutenza  treatment with Dr. Lazarus for next Wednesday afternoon. - Continue gabapentin  at current dosage. - Confirm appointment timing with front desk.  Chronic painful diabetic neuropathy: NEUROLYSIS   Please order Qutenza  patches   Standing Status:   Future   Expected Date:   01/29/2024   Expiration Date:   01/21/2025   Where will this procedure be performed?:   [facility]        [name] has a current medication list which includes the following long-term medication(s): atorvastatin , dextrose , duloxetine, fluticasone , gabapentin , gabapentin , lantus  solostar, ipratropium-albuterol , irbesartan , metoprolol  tartrate, metoprolol  tartrate, montelukast , nitroglycerin , and furosemide .  Pharmacotherapy (Medications Ordered): No orders of the defined types were placed in this encounter.  Orders:  Orders Placed This Encounter  Procedures   NEUROLYSIS    Please order Qutenza  patches    Standing Status:   Future    Expected Date:   01/29/2024    Expiration Date:   01/21/2025    Where will this procedure be performed?:   [facility]        Return in about 1 week (around 01/29/2024) for (Clinic): (B) Quentza Treatment # 3 with Dr. Marcelino .    Recent Visits No visits were found meeting these conditions. Showing recent visits within past 90 days and meeting all other requirements Today's Visits Date Type Provider Dept  01/22/24 Office Visit Lerone Onder K, NP [facility]  Showing today's visits and meeting all other requirements Future Appointments No visits were  found meeting these conditions. Showing future appointments within next 90 days and meeting all other requirements  I discussed the assessment and treatment plan with the patient. The patient was provided an opportunity to ask questions and all were answered. The patient agreed with the plan and demonstrated an understanding of the instructions.  Patient advised to call back or seek an in-person evaluation if the symptoms or condition worsens.  I personally spent a total of 20 minutes in the care of the patient today including preparing to see the patient, getting/reviewing separately obtained history, performing a medically appropriate exam/evaluation, counseling and educating, placing orders, referring and communicating with other health care professionals, documenting clinical information in the EHR, independently interpreting results, communicating results, and coordinating care.   Note by: Gagandeep Pettet K Lexie Koehl, NP (TTS and AI technology used. I apologize for any typographical errors that were not detected and corrected.) Date: 01/22/2024; Time: 3:59 PM

## 2024-01-22 NOTE — Progress Notes (Signed)
 Safety precautions to be maintained throughout the outpatient stay will include: orient to surroundings, keep bed in low position, maintain call bell within reach at all times, provide assistance with transfer out of bed and ambulation.

## 2024-01-26 ENCOUNTER — Telehealth: Payer: Self-pay

## 2024-01-26 NOTE — Telephone Encounter (Signed)
 Pt called to inform us  that Gemtesa  samples are working well, he questions if he needs RX or may receive more samples. Please advise.

## 2024-01-28 ENCOUNTER — Other Ambulatory Visit: Payer: Self-pay

## 2024-01-28 DIAGNOSIS — N138 Other obstructive and reflux uropathy: Secondary | ICD-10-CM

## 2024-01-28 MED ORDER — GEMTESA 75 MG PO TABS
75.0000 mg | ORAL_TABLET | Freq: Every day | ORAL | 11 refills | Status: AC
Start: 2024-01-28 — End: ?

## 2024-01-28 NOTE — Telephone Encounter (Signed)
 Called patient and patient said total care so I sent in Gemtesa 

## 2024-01-31 ENCOUNTER — Other Ambulatory Visit: Payer: Self-pay | Admitting: Urology

## 2024-02-02 ENCOUNTER — Other Ambulatory Visit: Payer: Self-pay | Admitting: Urology

## 2024-02-02 NOTE — Progress Notes (Deleted)
 02/03/2024 9:59 AM   Henry [name] 12/21/1945 [id number]  Referring provider: Glover Lenis, MD [phone] [place],  [place] [address]  Urological history: 1. 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  Nephrolithiasis -Left ESWL (2018)  -CT (2023) -bilateral renal stones up to 1.3 cm on the right and 0.9 cm on the left - CTU (12/2023) - Numerous bilateral renal calculi including many clustered small calculi in the superior pole calices of the right kidney   3.  High risk hematuria -former smoker -Non-contrast CT (2023) -no malignancies noted -cysto (2023) - no malignancies noted   4. Urethral stricture -dilation of mid pendulous urethral strictures with Optilume (07/2022)   5. Right parapelvic cyst  - CTU (12/2023) Right-sided parapelvic renal cyst,   No chief complaint on file.  HPI: Henry [name] is a 78 y.o. man who presents today for 1 month follow-up after starting finasteride 5 mg daily for BPH and Gemtesa  75 mg daily for urgency.  Previous records reviewed.   I PSS ***  He reports sensation of incomplete bladder emptying, urinary frequency, urinary intermittency, urinary urgency, a weak urinary stream, having to strain to void, nocturia x ***, leaking before being able to reach the restroom, leaking with coughing, leaking without awareness, and post void dribbling.     He is wearing *** pads//depends  daily.    Patient denies any modifying or aggravating factors.  Patient denies any recent UTI's, gross hematuria, dysuria or suprapubic/flank pain.  Patient denies any fevers, chills, nausea or vomiting.  ***  He has a family history of PCa, colon cancer, ovarian cancer and/or breast cancer with ***.   He does not have a family history of PCa, colon cancer, ovarian cancer, and/or breast cancer .***     UA***  PVR***  BPH meds: Finasteride 5 mg daily and  tamsulosin  0.4 mg daily   PMH: Past Medical History:  Diagnosis Date   Acute ST elevation myocardial infarction (STEMI) of anterior wall (HCC)    a.) LHC/PCI 08/10/2017: 99% thrombotic occlusion p-mLAD (2.5 x 28 mm Sierra DES), 70% pLAD (2.5 x 12 mm Sierra DES); b.) s/p staged orbital athrectomy and PCI pRCA (3.5 x 24 mm Resolute Onyx) 08/13/2017   Anginal pain    Arthritis    Basal cell carcinoma    CAD (coronary artery disease)    a.) LHC/PCI 08/10/2017: 50%/99% pRCA, 30% dRCA, 30% p-mLCx, 70% OM1, 99% thrombotic occlusion p-mLAD (2.5 x 28 mm Sierra DES), 70% pLAD (2.5 x 12 mm Sierra DES); b.) s/p staged orbital athrectomy and PCI pRCA (3.5 x 24 mm Resolute Onyx) 08/13/2017; c.) MPI 03/27/2020: mild inferior ischemic; d.) MPI 08/02/2020: no ischemia   Chronic, continuous use of opioids    CKD (chronic kidney disease), stage II    COPD (chronic obstructive pulmonary disease) (HCC)    Diabetic polyneuropathy (HCC)    Dyspnea    Glaucoma    History of kidney stones    Hyperlipidemia    Hypertension    Ischemic cardiomyopathy    a.) TTE 08/10/17: technically difficult study, no diagnostic RWMA, EF 40-50%, poor windows, very difficult study; b.) TTE 12/25/2019: EF 60-65%, RAE, G1DD; c.) TTE 07/28/2020: EF 55-60%, triv TR   Premature atrial contractions    Primary osteoarthritis of both hands    Skin cancer    Type 2 diabetes mellitus treated with insulin  (HCC)  Urethral stricture     Surgical History: Past Surgical History:  Procedure Laterality Date   CARDIAC CATHETERIZATION     COLONOSCOPY WITH PROPOFOL  N/A 12/08/2014   Procedure: COLONOSCOPY WITH PROPOFOL ;  Surgeon: Lamar ONEIDA Holmes, MD;  Location: [facility] ENDOSCOPY;  Service: Endoscopy;  Laterality: N/A;   COLONOSCOPY WITH PROPOFOL  N/A 01/10/2016   Procedure: COLONOSCOPY WITH PROPOFOL ;  Surgeon: Lamar ONEIDA Holmes, MD;  Location: [facility] ENDOSCOPY;  Service: Endoscopy;  Laterality: N/A;   COLONOSCOPY WITH PROPOFOL  N/A 04/28/2019    Procedure: COLONOSCOPY WITH PROPOFOL ;  Surgeon: Dessa Reyes ORN, MD;  Location: [facility] ENDOSCOPY;  Service: Endoscopy;  Laterality: N/A;   COLONOSCOPY WITH PROPOFOL  N/A 03/17/2020   Procedure: COLONOSCOPY WITH PROPOFOL ;  Surgeon: Dessa Reyes ORN, MD;  Location: [facility] ENDOSCOPY;  Service: Endoscopy;  Laterality: N/A;   CORONARY ANGIOPLASTY     CORONARY ATHERECTOMY N/A 08/13/2017   Procedure: CORONARY ATHERECTOMY;  Surgeon: Darron Deatrice LABOR, MD;  Location: [facility] INVASIVE CV LAB;  Service: Cardiovascular;  Laterality: N/A;   CORONARY STENT INTERVENTION N/A 08/13/2017   Procedure: CORONARY STENT INTERVENTION;  Surgeon: Darron Deatrice LABOR, MD;  Location: [facility] INVASIVE CV LAB;  Service: Cardiovascular;  Laterality: N/A;   CORONARY/GRAFT ACUTE MI REVASCULARIZATION N/A 08/10/2017   Procedure: Coronary/Graft Acute MI Revascularization;  Surgeon: Verlin Lonni BIRCH, MD;  Location: [facility] INVASIVE CV LAB;  Service: Cardiovascular;  Laterality: N/A;   CYSTOSCOPY W/ RETROGRADES Bilateral 08/05/2022   Procedure: CYSTOSCOPY WITH RETROGRADE PYELOGRAM;  Surgeon: Penne Knee, MD;  Location: [facility] ORS;  Service: Urology;  Laterality: Bilateral;   CYSTOSCOPY WITH URETHRAL DILATATION N/A 08/05/2022   Procedure: CYSTOSCOPY WITH URETHRAL BALLOON  DILATATION WITH OPTILUME;  Surgeon: Penne Knee, MD;  Location: [facility] ORS;  Service: Urology;  Laterality: N/A;   EXTRACORPOREAL SHOCK WAVE LITHOTRIPSY Right 11/09/2015   Procedure: EXTRACORPOREAL SHOCK WAVE LITHOTRIPSY (ESWL);  Surgeon: Ozell JONELLE Burkes, MD;  Location: [facility] ORS;  Service: Urology;  Laterality: Right;   EXTRACORPOREAL SHOCK WAVE LITHOTRIPSY Left 10/17/2016   Procedure: EXTRACORPOREAL SHOCK WAVE LITHOTRIPSY (ESWL);  Surgeon: Burkes Ozell JONELLE, MD;  Location: [facility] ORS;  Service: Urology;  Laterality: Left;   LEFT HEART CATH AND CORONARY ANGIOGRAPHY N/A 08/10/2017   Procedure: LEFT HEART CATH AND CORONARY ANGIOGRAPHY;  Surgeon: Verlin Lonni BIRCH, MD;   Location: [facility] INVASIVE CV LAB;  Service: Cardiovascular;  Laterality: N/A;   LITHOTRIPSY     x 6   PAROTIDECTOMY  2015   TEMPORARY PACEMAKER N/A 08/13/2017   Procedure: TEMPORARY PACEMAKER;  Surgeon: Darron Deatrice LABOR, MD;  Location: [facility] CV [facility];  Service: Cardiovascular;  Laterality: N/A;   TONSILLECTOMY      Home Medications:  Allergies as of 02/03/2024       Reactions   Lisinopril Cough        Medication List        Accurate as of February 02, 2024  9:59 AM. If you have any questions, ask your nurse or doctor.          aspirin  EC 81 MG tablet Take 1 tablet (81 mg total) by mouth daily.   atorvastatin  80 MG tablet Commonly known as: LIPITOR  Take 80 mg by mouth daily.   cetirizine 10 MG tablet Commonly known as: ZYRTEC Take 10 mg by mouth daily.   COMBIVENT  IN Inhale 1 puff into the lungs daily.   dextrose  40 % Gel Commonly known as: GLUTOSE Take by mouth.   diclofenac Sodium 1 % Gel Commonly known as: VOLTAREN Apply topically.  DULoxetine 60 MG capsule Commonly known as: CYMBALTA Take 60 mg by mouth.   empagliflozin 25 MG Tabs tablet Commonly known as: JARDIANCE Take 25 mg by mouth daily.   finasteride 5 MG tablet Commonly known as: PROSCAR Take 1 tablet (5 mg total) by mouth daily.   fluticasone  50 MCG/ACT nasal spray Commonly known as: FLONASE  Place 1 spray into both nostrils daily.   furosemide  20 MG tablet Commonly known as: LASIX  Take 1 tablet (20 mg total) by mouth daily.   gabapentin  600 MG tablet Commonly known as: NEURONTIN  Take 600 mg by mouth 3 (three) times daily.   gabapentin  300 MG capsule Commonly known as: NEURONTIN  Take 300 mg by mouth at bedtime.   Gemtesa  75 MG Tabs Generic drug: Vibegron  Take 1 tablet (75 mg total) by mouth daily.   Gemtesa  75 MG Tabs Generic drug: Vibegron  Take 1 tablet (75 mg total) by mouth daily.   Gemtesa  75 MG Tabs Generic drug: Vibegron  Take 1 tablet (75 mg total) by mouth  daily.   HYDROcodone -acetaminophen  5-325 MG tablet Commonly known as: NORCO/VICODIN Take 1 tablet by mouth 3 (three) times daily.   insulin  glargine-yfgn 100 UNIT/ML injection Commonly known as: SEMGLEE  90 Units 2 (two) times daily.   irbesartan  150 MG tablet Commonly known as: AVAPRO  Take 150 mg by mouth daily.   Janumet 50-1000 MG tablet Generic drug: sitaGLIPtin-metformin Take 1 tablet by mouth.   Lantus  SoloStar 100 UNIT/ML Solostar Pen Generic drug: insulin  glargine Inject 47 Units into the skin.   metoprolol  tartrate 25 MG tablet Commonly known as: LOPRESSOR  Take 1 tablet (25 mg total) by mouth 2 (two) times daily.   metoprolol  tartrate 25 MG tablet Commonly known as: LOPRESSOR  Take by mouth.   montelukast  10 MG tablet Commonly known as: SINGULAIR  Take 10 mg by mouth.   multivitamin with minerals Tabs tablet Take 1 tablet by mouth daily.   Na Sulfate-K Sulfate-Mg Sulfate concentrate 17.5-3.13-1.6 GM/177ML Soln Commonly known as: SUPREP Take 1 Bottle by mouth.   nitroGLYCERIN  0.4 MG SL tablet Commonly known as: NITROSTAT  PLACE 1 TABLET UNDER TONGUE EVERY 5 MIN AS NEEDED FOR CHEST PAIN IF NO RELIEF IN15 MIN CALL 911 (MAX 3 TABS)   oxybutynin  5 MG tablet Commonly known as: DITROPAN  Take 1 tablet (5 mg total) by mouth every 8 (eight) hours as needed for bladder spasms.   RSV bivalent vaccine 120 MCG/0.5ML injection Commonly known as: ABRYSVO Inject into the muscle.   tamsulosin  0.4 MG Caps capsule Commonly known as: FLOMAX  TAKE 1 CAPSULE BY MOUTH ONCE DAILY        Allergies:  Allergies  Allergen Reactions   Lisinopril Cough    Family History: Family History  Problem Relation Age of Onset   Heart attack Mother    Melanoma Father    Parkinsonism Father     Social History:  reports that he quit smoking about 6 years ago. His smoking use included cigarettes. He started smoking about 56 years ago. He has a 50 pack-year smoking history. He has  been exposed to tobacco smoke. He has never used smokeless tobacco. He reports that he does not drink alcohol and does not use drugs.  ROS: Pertinent ROS in HPI  Physical Exam: There were no vitals taken for this visit.  Constitutional:  Well nourished. Alert and oriented, No acute distress. HEENT: [place] AT, moist mucus membranes.  Trachea midline, no masses. Cardiovascular: No clubbing, cyanosis, or edema. Respiratory: Normal respiratory effort, no increased work of  breathing. GI: Abdomen is soft, non tender, non distended, no abdominal masses. Liver and spleen not palpable.  No hernias appreciated.  Stool sample for occult testing is not indicated.   GU: No CVA tenderness.  No bladder fullness or masses.  Patient with circumcised/uncircumcised phallus. ***Foreskin easily retracted***  Urethral meatus is patent.  No penile discharge. No penile lesions or rashes. Scrotum without lesions, cysts, rashes and/or edema.  Testicles are located scrotally bilaterally. No masses are appreciated in the testicles. Left and right epididymis are normal. Rectal: Patient with  normal sphincter tone. Anus and perineum without scarring or rashes. No rectal masses are appreciated. Prostate is approximately *** grams, *** nodules are appreciated. Seminal vesicles are normal. Skin: No rashes, bruises or suspicious lesions. Lymph: No cervical or inguinal adenopathy. Neurologic: Grossly intact, no focal deficits, moving all 4 extremities. Psychiatric: Normal mood and affect.   Laboratory Data: See EPIC and HPI I have reviewed the labs.  See HPI.     Pertinent Imaging: Narrative & Impression  CLINICAL DATA:  Microscopic hematuria, BPH, nephrolithiasis * Tracking Code: BO *   EXAM: CT ABDOMEN AND PELVIS WITHOUT AND WITH CONTRAST   TECHNIQUE: Multidetector CT imaging of the abdomen and pelvis was performed following the standard protocol before and following the bolus administration of intravenous contrast.    RADIATION DOSE REDUCTION: This exam was performed according to the departmental dose-optimization program which includes automated exposure control, adjustment of the mA and/or kV according to patient size and/or use of iterative reconstruction technique.   CONTRAST:  OMNIPAQUE  IOHEXOL  350 MG/ML SOLN   COMPARISON:  12/20/2021   FINDINGS: Lower chest: No acute abnormality. Coronary artery calcifications. Bronchial wall thickening and scattered ground-glass airspace opacities throughout the bilateral lung bases. Probable emphysema.   Hepatobiliary: No solid liver abnormality is seen. No gallstones, gallbladder wall thickening, or biliary dilatation.   Pancreas: Unremarkable. No pancreatic ductal dilatation or surrounding inflammatory changes.   Spleen: Normal in size without significant abnormality.   Adrenals/Urinary Tract: Adrenal glands re unremarkable. Numerous bilateral renal calculi including many clustered small calculi in the superior pole calices of the right kidney (series 6, image 72). Right-sided parapelvic renal cyst, which does not reflect hydronephrosis. No ureteral calculi or hydronephrosis. No urinary tract filling defect on delayed phase imaging. Bladder is unremarkable.   Stomach/Bowel: Stomach is within normal limits. Appendix appears normal. No evidence of bowel wall thickening, distention, or inflammatory changes. Descending and sigmoid diverticulosis.   Vascular/Lymphatic: Severe aortic atherosclerosis. No enlarged abdominal or pelvic lymph nodes.   Reproductive: Prostatomegaly.   Other: Fat containing bilateral inguinal hernias.  No ascites.   Musculoskeletal: No acute or significant osseous findings.   IMPRESSION: 1. Numerous bilateral renal calculi including multiple clustered small calculi in the superior pole calices of the right kidney. No ureteral calculi or hydronephrosis. 2. No urinary tract mass or suspicious contrast enhancement.  No urinary tract filling defect on delayed phase imaging. 3. Prostatomegaly. 4. Descending and sigmoid diverticulosis without evidence of acute diverticulitis. 5. Bronchial wall thickening and scattered ground-glass airspace opacities throughout the bilateral lung bases, consistent with infection or aspiration. 6. Coronary artery disease.   Aortic Atherosclerosis (ICD10-I70.0) and Emphysema (ICD10-J43.9).     Electronically Signed   By: Marolyn JONETTA Jaksch M.D.   On: 12/18/2023 21:13       I have independently reviewed the films.  See HPI.    Assessment & Plan:    1. BPH with LU TS - continue tamsulosin  0.4 mg  daily  - continue finasteride 5 mg daily ***  2.  Nephrolithiasis -bilateral nephrolithiasis on CTU -He does not want to pursue treatment for the stones at this time - Will continue to monitor  3. Urethral stricture - still voiding with a strong stream and no feelings of incomplete bladder emptying  4. High risk hematuria -CTU (12/2023) with clusters of stones bilaterally, no hydronephrosis and a right renal pelvic cyst -no report of gross heme  5. Urgency - UA *** -   No follow-ups on file.  These notes generated with voice recognition software. I apologize for typographical errors.  CLOTILDA HELON RIGGERS  [facility] [address]  [address] [place], [place] [address] [phone]

## 2024-02-03 ENCOUNTER — Ambulatory Visit: Admitting: Urology

## 2024-02-03 DIAGNOSIS — R3915 Urgency of urination: Secondary | ICD-10-CM

## 2024-02-03 DIAGNOSIS — N401 Enlarged prostate with lower urinary tract symptoms: Secondary | ICD-10-CM

## 2024-02-03 DIAGNOSIS — N200 Calculus of kidney: Secondary | ICD-10-CM

## 2024-02-03 DIAGNOSIS — N35919 Unspecified urethral stricture, male, unspecified site: Secondary | ICD-10-CM

## 2024-02-03 DIAGNOSIS — R319 Hematuria, unspecified: Secondary | ICD-10-CM

## 2024-02-04 ENCOUNTER — Encounter: Payer: Self-pay | Admitting: Internal Medicine

## 2024-02-05 DIAGNOSIS — I2109 ST elevation (STEMI) myocardial infarction involving other coronary artery of anterior wall: Secondary | ICD-10-CM | POA: Diagnosis not present

## 2024-02-05 DIAGNOSIS — I491 Atrial premature depolarization: Secondary | ICD-10-CM | POA: Diagnosis not present

## 2024-02-05 DIAGNOSIS — R079 Chest pain, unspecified: Secondary | ICD-10-CM | POA: Diagnosis not present

## 2024-02-05 DIAGNOSIS — Z955 Presence of coronary angioplasty implant and graft: Secondary | ICD-10-CM | POA: Diagnosis not present

## 2024-02-05 DIAGNOSIS — Z23 Encounter for immunization: Secondary | ICD-10-CM | POA: Diagnosis not present

## 2024-02-05 DIAGNOSIS — J449 Chronic obstructive pulmonary disease, unspecified: Secondary | ICD-10-CM | POA: Diagnosis not present

## 2024-02-05 DIAGNOSIS — I2510 Atherosclerotic heart disease of native coronary artery without angina pectoris: Secondary | ICD-10-CM | POA: Diagnosis not present

## 2024-02-05 DIAGNOSIS — R0602 Shortness of breath: Secondary | ICD-10-CM | POA: Diagnosis not present

## 2024-02-05 DIAGNOSIS — I10 Essential (primary) hypertension: Secondary | ICD-10-CM | POA: Diagnosis not present

## 2024-02-10 ENCOUNTER — Ambulatory Visit: Admitting: Registered Nurse

## 2024-02-10 ENCOUNTER — Encounter: Payer: Self-pay | Admitting: Internal Medicine

## 2024-02-10 ENCOUNTER — Ambulatory Visit
Admission: RE | Admit: 2024-02-10 | Discharge: 2024-02-10 | Disposition: A | Discharge status: Home / Self Care | Attending: Internal Medicine | Admitting: Internal Medicine

## 2024-02-10 ENCOUNTER — Encounter
Admission: RE | Disposition: A | Discharge status: Home / Self Care | Payer: Self-pay | Source: Home / Self Care | Attending: Internal Medicine

## 2024-02-10 DIAGNOSIS — K5730 Diverticulosis of large intestine without perforation or abscess without bleeding: Secondary | ICD-10-CM | POA: Diagnosis not present

## 2024-02-10 DIAGNOSIS — E1122 Type 2 diabetes mellitus with diabetic chronic kidney disease: Secondary | ICD-10-CM | POA: Diagnosis not present

## 2024-02-10 DIAGNOSIS — Z860101 Personal history of adenomatous and serrated colon polyps: Secondary | ICD-10-CM | POA: Diagnosis not present

## 2024-02-10 DIAGNOSIS — I25110 Atherosclerotic heart disease of native coronary artery with unstable angina pectoris: Secondary | ICD-10-CM | POA: Diagnosis not present

## 2024-02-10 DIAGNOSIS — N1831 Chronic kidney disease, stage 3a: Secondary | ICD-10-CM | POA: Diagnosis not present

## 2024-02-10 DIAGNOSIS — Z09 Encounter for follow-up examination after completed treatment for conditions other than malignant neoplasm: Secondary | ICD-10-CM | POA: Diagnosis not present

## 2024-02-10 DIAGNOSIS — K635 Polyp of colon: Secondary | ICD-10-CM | POA: Diagnosis not present

## 2024-02-10 DIAGNOSIS — K640 First degree hemorrhoids: Secondary | ICD-10-CM | POA: Diagnosis not present

## 2024-02-10 DIAGNOSIS — Z1211 Encounter for screening for malignant neoplasm of colon: Secondary | ICD-10-CM | POA: Diagnosis not present

## 2024-02-10 DIAGNOSIS — D123 Benign neoplasm of transverse colon: Secondary | ICD-10-CM | POA: Diagnosis not present

## 2024-02-10 DIAGNOSIS — K648 Other hemorrhoids: Secondary | ICD-10-CM | POA: Diagnosis not present

## 2024-02-10 DIAGNOSIS — I129 Hypertensive chronic kidney disease with stage 1 through stage 4 chronic kidney disease, or unspecified chronic kidney disease: Secondary | ICD-10-CM | POA: Diagnosis not present

## 2024-02-10 HISTORY — DX: Calculus of kidney: N20.0

## 2024-02-10 HISTORY — PX: POLYPECTOMY: SHX149

## 2024-02-10 HISTORY — DX: Dyspnea, unspecified: R06.00

## 2024-02-10 HISTORY — DX: Anesthesia of skin: R20.0

## 2024-02-10 HISTORY — DX: Atrial premature depolarization: I49.1

## 2024-02-10 HISTORY — DX: Basal cell carcinoma of skin, unspecified: C44.91

## 2024-02-10 HISTORY — DX: Primary osteoarthritis, left hand: M19.042

## 2024-02-10 HISTORY — DX: Sleep disorder, unspecified: G47.9

## 2024-02-10 HISTORY — PX: COLONOSCOPY: SHX5424

## 2024-02-10 LAB — GLUCOSE, CAPILLARY
Glucose-Capillary: 80 mg/dL (ref 70–99)
Glucose-Capillary: 94 mg/dL (ref 70–99)

## 2024-02-10 SURGERY — COLONOSCOPY
Anesthesia: General

## 2024-02-10 MED ORDER — DEXTROSE 50 % IV SOLN
12.5000 g | Freq: Once | INTRAVENOUS | Status: AC
Start: 2024-02-10 — End: 2024-02-10
  Administered 2024-02-10: 12.5 g via INTRAVENOUS

## 2024-02-10 MED ORDER — SODIUM CHLORIDE 0.9 % IV SOLN: INTRAVENOUS | Status: DC

## 2024-02-10 MED ORDER — PROPOFOL 10 MG/ML IV BOLUS
INTRAVENOUS | Status: DC | PRN
  Administered 2024-02-10: 50 mg via INTRAVENOUS
  Administered 2024-02-10: 150 ug/kg/min via INTRAVENOUS
  Administered 2024-02-10: 50 mg via INTRAVENOUS

## 2024-02-10 MED ORDER — DEXTROSE 50 % IV SOLN
INTRAVENOUS | Status: AC
  Filled 2024-02-10: qty 50

## 2024-02-10 MED ORDER — EPHEDRINE 5 MG/ML INJ
INTRAVENOUS | Status: AC
  Filled 2024-02-10: qty 5

## 2024-02-10 MED ORDER — LIDOCAINE HCL (CARDIAC) PF 100 MG/5ML IV SOSY
PREFILLED_SYRINGE | INTRAVENOUS | Status: DC | PRN
  Administered 2024-02-10: 100 mg via INTRAVENOUS

## 2024-02-10 MED ORDER — EPHEDRINE SULFATE-NACL 50-0.9 MG/10ML-% IV SOSY
PREFILLED_SYRINGE | INTRAVENOUS | Status: DC | PRN
  Administered 2024-02-10: 10 mg via INTRAVENOUS
  Administered 2024-02-10: 5 mg via INTRAVENOUS

## 2024-02-10 MED ORDER — LIDOCAINE HCL (PF) 2 % IJ SOLN
INTRAMUSCULAR | Status: AC
  Filled 2024-02-10: qty 5

## 2024-02-10 NOTE — H&P (Signed)
 Outpatient short stay form Pre-procedure 02/10/2024 9:46 AM Anthony Tamburo K. Aundria, M.D.  Primary Physician: Alm Na, M.D.  Reason for visit:  Personal history of adenomatous colon polyps  History of present illness:                            Patient presents for colonoscopy for a personal hx of colon polyps. The patient denies abdominal pain, abnormal weight loss or rectal bleeding.      Current Facility-Administered Medications:    0.9 %  sodium chloride  infusion, , Intravenous, Continuous, Maite Burlison K, MD   dextrose  50 % solution 12.5 g, 12.5 g, Intravenous, Once, Myra Lynwood MATSU, MD  Medications Prior to Admission  Medication Sig Dispense Refill Last Dose/Taking   aspirin  EC 81 MG EC tablet Take 1 tablet (81 mg total) by mouth daily.   02/09/2024   atorvastatin  (LIPITOR ) 80 MG tablet Take 80 mg by mouth daily.   02/10/2024 at  6:00 AM   cetirizine (ZYRTEC) 10 MG tablet Take 10 mg by mouth daily.   02/10/2024 at  6:00 AM   DULoxetine (CYMBALTA) 60 MG capsule Take 60 mg by mouth.   02/10/2024 Morning   empagliflozin (JARDIANCE) 25 MG TABS tablet Take 25 mg by mouth daily.   02/09/2024   finasteride (PROSCAR) 5 MG tablet Take 1 tablet (5 mg total) by mouth daily. 90 tablet 3 02/10/2024 Morning   insulin  glargine (LANTUS  SOLOSTAR) 100 UNIT/ML Solostar Pen Inject 47 Units into the skin.   02/10/2024 at  6:00 AM   irbesartan  (AVAPRO ) 150 MG tablet Take 150 mg by mouth daily.   02/10/2024 at  6:00 AM   metoprolol  tartrate (LOPRESSOR ) 25 MG tablet Take by mouth.   02/10/2024 at  6:00 AM   montelukast  (SINGULAIR ) 10 MG tablet Take 10 mg by mouth.   02/09/2024 Bedtime   sitaGLIPtin-metformin (JANUMET) 50-1000 MG tablet Take 1 tablet by mouth.   02/09/2024   tamsulosin  (FLOMAX ) 0.4 MG CAPS capsule TAKE 1 CAPSULE BY MOUTH ONCE DAILY 90 capsule 3 02/10/2024 at  6:00 AM   dextrose  (GLUTOSE) 40 % GEL Take by mouth.      diclofenac Sodium (VOLTAREN) 1 % GEL Apply topically.      fluticasone   (FLONASE ) 50 MCG/ACT nasal spray Place 1 spray into both nostrils daily.      furosemide  (LASIX ) 20 MG tablet Take 1 tablet (20 mg total) by mouth daily. (Patient not taking: Reported on 01/22/2024) 30 tablet 1    gabapentin  (NEURONTIN ) 300 MG capsule Take 300 mg by mouth at bedtime.      gabapentin  (NEURONTIN ) 600 MG tablet Take 600 mg by mouth 3 (three) times daily.      HYDROcodone -acetaminophen  (NORCO/VICODIN) 5-325 MG tablet Take 1 tablet by mouth 3 (three) times daily. 10 tablet 0    insulin  glargine-yfgn (SEMGLEE ) 100 UNIT/ML injection 90 Units 2 (two) times daily.      Ipratropium-Albuterol  (COMBIVENT  IN) Inhale 1 puff into the lungs daily.      metoprolol  tartrate (LOPRESSOR ) 25 MG tablet Take 1 tablet (25 mg total) by mouth 2 (two) times daily. 60 tablet 2    Multiple Vitamin (MULTIVITAMIN WITH MINERALS) TABS tablet Take 1 tablet by mouth daily. (Patient not taking: Reported on 01/22/2024)      Na Sulfate-K Sulfate-Mg Sulfate concentrate (SUPREP) 17.5-3.13-1.6 GM/177ML SOLN Take 1 Bottle by mouth.      nitroGLYCERIN  (NITROSTAT ) 0.4 MG SL tablet PLACE 1  TABLET UNDER TONGUE EVERY 5 MIN AS NEEDED FOR CHEST PAIN IF NO RELIEF IN15 MIN CALL 911 (MAX 3 TABS) 25 tablet 11    oxybutynin  (DITROPAN ) 5 MG tablet Take 1 tablet (5 mg total) by mouth every 8 (eight) hours as needed for bladder spasms. 9 tablet 0    RSV bivalent vaccine (ABRYSVO) 120 MCG/0.5ML injection Inject into the muscle.      Vibegron  (GEMTESA ) 75 MG TABS Take 1 tablet (75 mg total) by mouth daily.      Vibegron  (GEMTESA ) 75 MG TABS Take 1 tablet (75 mg total) by mouth daily.      Vibegron  (GEMTESA ) 75 MG TABS Take 1 tablet (75 mg total) by mouth daily. 30 tablet 11      Allergies  Allergen Reactions   Lisinopril Cough     Past Medical History:  Diagnosis Date   Acute ST elevation myocardial infarction (STEMI) of anterior wall (HCC)    a.) LHC/PCI 08/10/2017: 99% thrombotic occlusion p-mLAD (2.5 x 28 mm Sierra DES), 70%  pLAD (2.5 x 12 mm Sierra DES); b.) s/p staged orbital athrectomy and PCI pRCA (3.5 x 24 mm Resolute Onyx) 08/13/2017   Anginal pain    Arthritis    Basal cell carcinoma    CAD (coronary artery disease)    a.) LHC/PCI 08/10/2017: 50%/99% pRCA, 30% dRCA, 30% p-mLCx, 70% OM1, 99% thrombotic occlusion p-mLAD (2.5 x 28 mm Sierra DES), 70% pLAD (2.5 x 12 mm Sierra DES); b.) s/p staged orbital athrectomy and PCI pRCA (3.5 x 24 mm Resolute Onyx) 08/13/2017; c.) MPI 03/27/2020: mild inferior ischemic; d.) MPI 08/02/2020: no ischemia   Chronic, continuous use of opioids    CKD (chronic kidney disease), stage II    COPD (chronic obstructive pulmonary disease) (HCC)    Diabetic polyneuropathy (HCC)    Difficulty sleeping    Dyspnea    Glaucoma    History of kidney stones    Hyperlipidemia    Hypertension    Ischemic cardiomyopathy    a.) TTE 08/10/17: technically difficult study, no diagnostic RWMA, EF 40-50%, poor windows, very difficult study; b.) TTE 12/25/2019: EF 60-65%, RAE, G1DD; c.) TTE 07/28/2020: EF 55-60%, triv TR   Nephrolithiasis    Numbness and tingling of both feet    Premature atrial contractions    Primary osteoarthritis of both hands    Skin cancer    Type 2 diabetes mellitus treated with insulin  (HCC)    Urethral stricture     Review of systems:  Otherwise negative.    Physical Exam  Gen: Alert, oriented. Appears stated age.  HEENT: [place]/AT. PERRLA. Lungs: CTA, no wheezes. CV: RR nl S1, S2. Abd: soft, benign, no masses. BS+ Ext: No edema. Pulses 2+    Planned procedures: Proceed with colonoscopy. The patient understands the nature of the planned procedure, indications, risks, alternatives and potential complications including but not limited to bleeding, infection, perforation, damage to internal organs and possible oversedation/side effects from anesthesia. The patient agrees and gives consent to proceed.  Please refer to procedure notes for findings, recommendations and  patient disposition/instructions.     Dayrin Stallone K. Aundria, M.D. Gastroenterology 02/10/2024  9:46 AM

## 2024-02-10 NOTE — Anesthesia Preprocedure Evaluation (Signed)
 Anesthesia Evaluation  Patient identified by MRN, date of birth, ID band Patient awake    Reviewed: Allergy & Precautions, H&P , NPO status , Patient's Chart, lab work & pertinent test results, reviewed documented beta blocker date and time   Airway Mallampati: II   Neck ROM: full    Dental  (+) Poor Dentition   Pulmonary shortness of breath and with exertion, pneumonia, resolved, COPD, former smoker   Pulmonary exam normal        Cardiovascular Exercise Tolerance: Poor hypertension, On Medications + angina with exertion + CAD, + Past MI and + Cardiac Stents  Normal cardiovascular exam Rhythm:regular Rate:Normal     Neuro/Psych  PSYCHIATRIC DISORDERS       Neuromuscular disease    GI/Hepatic negative GI ROS, Neg liver ROS,,,  Endo/Other  negative endocrine ROSdiabetes, Well Controlled    Renal/GU Renal disease  negative genitourinary   Musculoskeletal   Abdominal   Peds  Hematology negative hematology ROS (+)   Anesthesia Other Findings Past Medical History: No date: Acute ST elevation myocardial infarction (STEMI) of anterior  wall (HCC)     Comment:  a.) LHC/PCI 08/10/2017: 99% thrombotic occlusion p-mLAD               (2.5 x 28 mm Sierra DES), 70% pLAD (2.5 x 12 mm Sierra               DES); b.) s/p staged orbital athrectomy and PCI pRCA (3.5              x 24 mm Resolute Onyx) 08/13/2017 No date: Anginal pain No date: Arthritis No date: Basal cell carcinoma No date: CAD (coronary artery disease)     Comment:  a.) LHC/PCI 08/10/2017: 50%/99% pRCA, 30% dRCA, 30%               p-mLCx, 70% OM1, 99% thrombotic occlusion p-mLAD (2.5 x               28 mm Sierra DES), 70% pLAD (2.5 x 12 mm Sierra DES); b.)              s/p staged orbital athrectomy and PCI pRCA (3.5 x 24 mm               Resolute Onyx) 08/13/2017; c.) MPI 03/27/2020: mild               inferior ischemic; d.) MPI 08/02/2020: no ischemia No date:  Chronic, continuous use of opioids No date: CKD (chronic kidney disease), stage II No date: COPD (chronic obstructive pulmonary disease) (HCC) No date: Diabetic polyneuropathy (HCC) No date: Difficulty sleeping No date: Dyspnea No date: Glaucoma No date: History of kidney stones No date: Hyperlipidemia No date: Hypertension No date: Ischemic cardiomyopathy     Comment:  a.) TTE 08/10/17: technically difficult study, no               diagnostic RWMA, EF 40-50%, poor windows, very difficult               study; b.) TTE 12/25/2019: EF 60-65%, RAE, G1DD; c.) TTE               07/28/2020: EF 55-60%, triv TR No date: Nephrolithiasis No date: Numbness and tingling of both feet No date: Premature atrial contractions No date: Primary osteoarthritis of both hands No date: Skin cancer No date: Type 2 diabetes mellitus treated with insulin  (HCC) No date: Urethral stricture Past Surgical History: No date:  CARDIAC CATHETERIZATION 12/08/2014: COLONOSCOPY WITH PROPOFOL ; N/A     Comment:  Procedure: COLONOSCOPY WITH PROPOFOL ;  Surgeon: Lamar ONEIDA Holmes, MD;  Location: [facility] ENDOSCOPY;  Service:               Endoscopy;  Laterality: N/A; 01/10/2016: COLONOSCOPY WITH PROPOFOL ; N/A     Comment:  Procedure: COLONOSCOPY WITH PROPOFOL ;  Surgeon: Lamar ONEIDA Holmes, MD;  Location: [facility] ENDOSCOPY;  Service:               Endoscopy;  Laterality: N/A; 04/28/2019: COLONOSCOPY WITH PROPOFOL ; N/A     Comment:  Procedure: COLONOSCOPY WITH PROPOFOL ;  Surgeon: Dessa Reyes ORN, MD;  Location: [facility] ENDOSCOPY;  Service:               Endoscopy;  Laterality: N/A; 03/17/2020: COLONOSCOPY WITH PROPOFOL ; N/A     Comment:  Procedure: COLONOSCOPY WITH PROPOFOL ;  Surgeon: Dessa Reyes ORN, MD;  Location: [facility] ENDOSCOPY;  Service:               Endoscopy;  Laterality: N/A; No date: CORONARY ANGIOPLASTY 08/13/2017: CORONARY ATHERECTOMY; N/A     Comment:  Procedure:  CORONARY ATHERECTOMY;  Surgeon: Darron Deatrice LABOR, MD;  Location: [facility] INVASIVE CV LAB;  Service:               Cardiovascular;  Laterality: N/A; 08/13/2017: CORONARY STENT INTERVENTION; N/A     Comment:  Procedure: CORONARY STENT INTERVENTION;  Surgeon: Darron Deatrice LABOR, MD;  Location: [facility] INVASIVE CV LAB;  Service:               Cardiovascular;  Laterality: N/A; 08/10/2017: CORONARY/GRAFT ACUTE MI REVASCULARIZATION; N/A     Comment:  Procedure: Coronary/Graft Acute MI Revascularization;                Surgeon: Verlin Lonni BIRCH, MD;  Location: [facility]               INVASIVE CV LAB;  Service: Cardiovascular;  Laterality:               N/A; 08/05/2022: CYSTOSCOPY W/ RETROGRADES; Bilateral     Comment:  Procedure: CYSTOSCOPY WITH RETROGRADE PYELOGRAM;                Surgeon: Penne Knee, MD;  Location: [facility] ORS;                Service: Urology;  Laterality: Bilateral; 08/05/2022: CYSTOSCOPY WITH URETHRAL DILATATION; N/A     Comment:  Procedure: CYSTOSCOPY WITH URETHRAL BALLOON  DILATATION               WITH OPTILUME;  Surgeon: Penne Knee, MD;  Location:               [facility] ORS;  Service: Urology;  Laterality: N/A; 11/09/2015: EXTRACORPOREAL SHOCK WAVE LITHOTRIPSY; Right     Comment:  Procedure: EXTRACORPOREAL SHOCK WAVE LITHOTRIPSY (ESWL);              Surgeon: Ozell JONELLE Burkes, MD;  Location: [facility] ORS;  Service: Urology;  Laterality: Right; 10/17/2016: EXTRACORPOREAL SHOCK WAVE LITHOTRIPSY; Left     Comment:  Procedure: EXTRACORPOREAL SHOCK WAVE LITHOTRIPSY (ESWL);              Surgeon: Kassie Ozell SAUNDERS, MD;  Location: [facility] ORS;                Service: Urology;  Laterality: Left; 08/10/2017: LEFT HEART CATH AND CORONARY ANGIOGRAPHY; N/A     Comment:  Procedure: LEFT HEART CATH AND CORONARY ANGIOGRAPHY;                Surgeon: Verlin Lonni BIRCH, MD;  Location: [facility]               INVASIVE CV LAB;  Service: Cardiovascular;   Laterality:               N/A; No date: LITHOTRIPSY     Comment:  x 6 No date: MOHS nose; N/A No date: NASAL SINUS SURGERY; N/A 2015: PAROTIDECTOMY 08/13/2017: TEMPORARY PACEMAKER; N/A     Comment:  Procedure: TEMPORARY PACEMAKER;  Surgeon: Darron Deatrice LABOR, MD;  Location: [facility] INVASIVE CV LAB;  Service:               Cardiovascular;  Laterality: N/A; No date: TONSILLECTOMY No date: 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; Right BMI    Body Mass Index: 29.24 kg/m     Reproductive/Obstetrics negative OB ROS                              Anesthesia Physical Anesthesia Plan  ASA: 3  Anesthesia Plan: General   Post-op Pain Management:    Induction:   PONV Risk Score and Plan:   Airway Management Planned:   Additional Equipment:   Intra-op Plan:   Post-operative Plan:   Informed Consent: I have reviewed the patients History and Physical, chart, labs and discussed the procedure including the risks, benefits and alternatives for the proposed anesthesia with the patient or authorized representative who has indicated his/her understanding and acceptance.     Dental Advisory Given  Plan Discussed with: CRNA  Anesthesia Plan Comments:         Anesthesia Quick Evaluation

## 2024-02-10 NOTE — Interval H&P Note (Signed)
 History and Physical Interval Note:  02/10/2024 9:46 AM  Henry [name]  has presented today for surgery, with the diagnosis of Personal history of adenomatous and serrated colon polyps [Z86.0101].  The various methods of treatment have been discussed with the patient and family. After consideration of risks, benefits and other options for treatment, the patient has consented to  Procedure(s): COLONOSCOPY (N/A) as a surgical intervention.  The patient's history has been reviewed, patient examined, no change in status, stable for surgery.  I have reviewed the patient's chart and labs.  Questions were answered to the patient's satisfaction.     [place], Tinley Rought

## 2024-02-10 NOTE — Transfer of Care (Signed)
 Immediate Anesthesia Transfer of Care Note  Patient: Henry [name]  Procedure(s) Performed: COLONOSCOPY POLYPECTOMY, INTESTINE  Patient Location: Endoscopy Unit  Anesthesia Type:General  Level of Consciousness: drowsy and patient cooperative  Airway & Oxygen  Therapy: Patient Spontanous Breathing  Post-op Assessment: Report given to RN and Post -op Vital signs reviewed and stable  Post vital signs: Reviewed and stable  Last Vitals:  Vitals Value Taken Time  BP 108/95 02/10/24 10:20  Temp 36 C 02/10/24 10:20  Pulse 82 02/10/24 10:20  Resp 14 02/10/24 10:20  SpO2 96 % 02/10/24 10:20    Last Pain:  Vitals:   02/10/24 1020  TempSrc: Tympanic  PainSc:          Complications: No notable events documented.

## 2024-02-10 NOTE — Anesthesia Procedure Notes (Signed)
 Procedure Name: MAC Date/Time: 02/10/2024 9:55 AM  Performed by: Lorrene Camelia LABOR, CRNAPre-anesthesia Checklist: Patient identified, Emergency Drugs available, Suction available and Patient being monitored Patient Re-evaluated:Patient Re-evaluated prior to induction Oxygen  Delivery Method: Simple face mask Preoxygenation: Pre-oxygenation with 100% oxygen  Induction Type: IV induction Comments: POM

## 2024-02-10 NOTE — Op Note (Signed)
 [facility] Patient Name: Henry [name] Procedure Date: 02/10/2024 9:46 AM MRN: [id number] Account #: [id number] Date of Birth: 1946/03/11 Admit Type: Outpatient Age: 78 Room: [facility] ENDO ROOM 3 Gender: Male Note Status: Finalized Instrument Name: Colon Scope [id number] Procedure:             Colonoscopy Indications:           High risk colon cancer surveillance: Personal history                         of non-advanced adenoma, High risk colon cancer                         surveillance: Personal history of sessile serrated                         colon polyp (less than 10 mm in size) with no dysplasia Providers:             Michaele Amundson K. Aundria MD, MD Referring MD:          Alm HERO. Glover, MD (Referring MD) Medicines:             Propofol  per Anesthesia Complications:         No immediate complications. Estimated blood loss: None. Procedure:             Pre-Anesthesia Assessment:                        - The risks and benefits of the procedure and the                         sedation options and risks were discussed with the                         patient. All questions were answered and informed                         consent was obtained.                        - Patient identification and proposed procedure were                         verified prior to the procedure by the nurse. The                         procedure was verified in the procedure room.                        - ASA Grade Assessment: III - A patient with severe                         systemic disease.                        - After reviewing the risks and benefits, the patient                         was deemed in satisfactory condition to undergo the  procedure.                        After obtaining informed consent, the colonoscope was                         passed under direct vision. Throughout the procedure,                         the patient's  blood pressure, pulse, and oxygen                          saturations were monitored continuously. The                         Colonoscope was introduced through the anus and                         advanced to the the cecum, identified by appendiceal                         orifice and ileocecal valve. The colonoscopy was                         performed without difficulty. The patient tolerated                         the procedure well. The quality of the bowel                         preparation was adequate. The ileocecal valve,                         appendiceal orifice, and rectum were photographed. Findings:      The perianal and digital rectal examinations were normal. Pertinent       negatives include normal sphincter tone and no palpable rectal lesions.      Non-bleeding internal hemorrhoids were found during retroflexion. The       hemorrhoids were Grade I (internal hemorrhoids that do not prolapse).      Many medium-mouthed and small-mouthed diverticula were found in the       sigmoid colon. There was no evidence of diverticular bleeding.      A single large-mouthed diverticulum was found in the proximal ascending       colon. Estimated blood loss: none.      An 11 mm polyp was found in the proximal transverse colon. The polyp was       sessile. The polyp was removed with a hot snare. Resection and retrieval       were complete. Estimated blood loss: none.      A tattoo was seen in the mid transverse colon. The tattoo site appeared       normal. Estimated blood loss: none.      The exam was otherwise without abnormality. Impression:            - Non-bleeding internal hemorrhoids.                        - Mild diverticulosis in the sigmoid colon. There was  no evidence of diverticular bleeding.                        - Diverticulosis in the proximal ascending colon.                        - One 11 mm polyp in the proximal transverse colon,                          removed with a hot snare. Resected and retrieved.                        - A tattoo was seen in the mid transverse colon. The                         tattoo site appeared normal.                        - The examination was otherwise normal. Recommendation:        - Patient has a contact number available for                         emergencies. The signs and symptoms of potential                         delayed complications were discussed with the patient.                         Return to normal activities tomorrow. Written                         discharge instructions were provided to the patient.                        - Resume previous diet.                        - Continue present medications.                        - Await pathology results.                        - If polyps are benign or adenomatous without                         dysplasia, I will advise NO further colonoscopy due to                         advanced age and/or severe comorbidity.                        - Return to GI office PRN.                        - The findings and recommendations were discussed with                         the patient. Procedure Code(s):     --- Professional ---  54614, Colonoscopy, flexible; with removal of                         tumor(s), polyp(s), or other lesion(s) by snare                         technique Diagnosis Code(s):     --- Professional ---                        K57.30, Diverticulosis of large intestine without                         perforation or abscess without bleeding                        Z86.010, Personal history of colonic polyps                        D12.3, Benign neoplasm of transverse colon (hepatic                         flexure or splenic flexure)                        K64.0, First degree hemorrhoids CPT copyright 2022 [employer]. All rights reserved. The codes documented in this report are  preliminary and upon coder review may  be revised to meet current compliance requirements. Ladell MARLA Boss MD, MD 02/10/2024 10:17:52 AM This report has been signed electronically. Number of Addenda: 0 Note Initiated On: 02/10/2024 9:46 AM Scope Withdrawal Time: 0 hours 8 minutes 20 seconds  Total Procedure Duration: 0 hours 15 minutes 14 seconds  Estimated Blood Loss:  Estimated blood loss: none.      [facility]

## 2024-02-10 NOTE — Anesthesia Postprocedure Evaluation (Signed)
 Anesthesia Post Note  Patient: Henry [name]  Procedure(s) Performed: COLONOSCOPY POLYPECTOMY, INTESTINE  Patient location during evaluation: PACU Anesthesia Type: General Level of consciousness: awake and alert Pain management: pain level controlled Vital Signs Assessment: post-procedure vital signs reviewed and stable Respiratory status: spontaneous breathing, nonlabored ventilation, respiratory function stable and patient connected to nasal cannula oxygen  Cardiovascular status: blood pressure returned to baseline and stable Postop Assessment: no apparent nausea or vomiting Anesthetic complications: no   No notable events documented.   Last Vitals:  Vitals:   02/10/24 1031 02/10/24 1039  BP: (!) 80/56 111/67  Pulse: 74   Resp: 14   Temp:    SpO2: 94%     Last Pain:  Vitals:   02/10/24 1039  TempSrc:   PainSc: 0-No pain                 Lynwood KANDICE Clause

## 2024-02-11 LAB — SURGICAL PATHOLOGY

## 2024-03-24 NOTE — Progress Notes (Signed)
 Henry [name]                                          MRN: [id number]   03/24/2024   The [facility] Quality Team Specialist reviewed this patient medical record for the purposes of chart review for care gap closure. The following were reviewed: abstraction for care gap closure-controlling blood pressure.    [facility] Quality Team

## 2024-04-15 ENCOUNTER — Ambulatory Visit
Attending: Student in an Organized Health Care Education/Training Program | Admitting: Student in an Organized Health Care Education/Training Program

## 2024-04-15 ENCOUNTER — Encounter: Payer: Self-pay | Admitting: Student in an Organized Health Care Education/Training Program

## 2024-04-15 VITALS — BP 114/68 | HR 66 | Temp 97.7°F | Resp 16 | Ht 72.0 in | Wt 215.0 lb

## 2024-04-15 DIAGNOSIS — E1140 Type 2 diabetes mellitus with diabetic neuropathy, unspecified: Secondary | ICD-10-CM | POA: Diagnosis present

## 2024-04-15 MED ORDER — CAPSAICIN-CLEANSING GEL 8 % EX KIT
4.0000 | PACK | Freq: Once | CUTANEOUS | Status: AC
  Administered 2024-04-15: 4 via TOPICAL

## 2024-04-15 NOTE — Patient Instructions (Signed)

## 2024-04-15 NOTE — Progress Notes (Signed)
 Safety precautions to be maintained throughout the outpatient stay will include: orient to surroundings, keep bed in low position, maintain call bell within reach at all times, provide assistance with transfer out of bed and ambulation.

## 2024-04-15 NOTE — Progress Notes (Signed)
 PROVIDER NOTE: Interpretation of information contained herein should be left to medically-trained personnel. Specific patient instructions are provided elsewhere under Patient Instructions section of medical record. This document was created in part using STT-dictation technology, any transcriptional errors that may result from this process are unintentional.  Patient: Henry [name] Type: Established DOB: 05/09/1945 MRN: [id number] PCP: Glover Lenis, MD  Service: Procedure DOS: 04/15/2024 Setting: Ambulatory Location: Ambulatory outpatient facility Delivery: Face-to-face Provider: Wallie Sherry, MD Specialty: Interventional Pain Management Specialty designation: 09 Location: Outpatient facility Ref. Prov.: Glover Lenis, MD       Interventional Therapy     Interventional Treatment:          Procedure: Qutenza  Neurolysis #3  Laterality:  Bilateral Area treated: Feet Imaging Guidance: None Anesthesia/analgesia/anxiolysis/sedation: None required Medication (Right): Qutenza  patches Medication (Left): Qutenza  patches Date: 04/15/2024 Performed by: Wallie Sherry, MD 1. Chronic painful diabetic neuropathy (HCC)    NAS-11 Pain score:   Pre-procedure: 10-Worst pain ever/10   Post-procedure: 10-Worst pain ever/10     Position / Prep / Materials:  Position: Supine  Materials: Qutenza  Kit  Pre-op H&P Assessment:  Henry [name] is a 79 y.o. (year old), male patient, seen today for interventional treatment. He  has a past surgical history that includes Lithotripsy; Colonoscopy with propofol  (N/A, 12/08/2014); Parotidectomy (2015); Extracorporeal shock wave lithotripsy (Right, 11/09/2015); Tonsillectomy; Colonoscopy with propofol  (N/A, 01/10/2016); Extracorporeal shock wave lithotripsy (Left, 10/17/2016); Coronary/Graft Acute MI Revascularization (N/A, 08/10/2017); LEFT HEART CATH AND CORONARY ANGIOGRAPHY (N/A, 08/10/2017); CORONARY ATHERECTOMY (N/A, 08/13/2017); CORONARY STENT  INTERVENTION (N/A, 08/13/2017); TEMPORARY PACEMAKER (N/A, 08/13/2017); Cardiac catheterization; Colonoscopy with propofol  (N/A, 04/28/2019); Colonoscopy with propofol  (N/A, 03/17/2020); Cystoscopy with urethral dilatation (N/A, 08/05/2022); Cystoscopy w/ retrogrades (Bilateral, 08/05/2022); Coronary angioplasty; Nasal sinus surgery (N/A); MOHS nose (N/A); 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 Henry [name] has a current medication list which includes the following prescription(s): aspirin  ec, atorvastatin , cetirizine, dextrose , diclofenac sodium, duloxetine, empagliflozin, finasteride , fluticasone , gabapentin , gabapentin , hydrocodone -acetaminophen , lantus  solostar, insulin  glargine-yfgn, ipratropium-albuterol , irbesartan , metoprolol  tartrate, metoprolol  tartrate, montelukast , na sulfate-k sulfate-mg sulfate concentrate, nitroglycerin , oxybutynin , rsv bivalent vaccine, janumet, tamsulosin , gemtesa , furosemide , multivitamin with minerals, gemtesa , and gemtesa . His primarily concern today is the Foot Pain (Bilateral)  Initial Vital Signs:  Pulse/HCG Rate: 66  Temp: 97.7 F (36.5 C) Resp: 16 BP: 114/68 SpO2: 95 %  BMI: Estimated body mass index is 29.16 kg/m as calculated from the following:   Height as of this encounter: 6' (1.829 m).   Weight as of this encounter: 215 lb (97.5 kg).  Risk Assessment: Allergies: Reviewed. He is allergic to lisinopril.  Allergy Precautions: None required Coagulopathies: Reviewed. None identified.  Blood-thinner therapy: None at this time Active Infection(s): Reviewed. None identified. Henry [name] is afebrile  Site Confirmation: Henry [name] was asked to confirm the procedure and laterality before marking the site Procedure checklist: Completed Consent: Before the procedure and under the influence of no sedative(s), amnesic(s), or anxiolytics, the patient was informed of the treatment options, risks and  possible complications. To fulfill our ethical and legal obligations, as recommended by the American Medical Association's Code of Ethics, I have informed the patient of my clinical impression; the nature and purpose of the treatment or procedure; the risks, benefits, and possible complications of the intervention; the alternatives, including doing nothing; the risk(s) and benefit(s) of the alternative treatment(s) or procedure(s); and the risk(s) and benefit(s) of doing nothing. The patient was provided information about the general risks and possible complications associated with  the procedure. These may include, but are not limited to: failure to achieve desired goals, infection, bleeding, organ or nerve damage, allergic reactions, paralysis, and death. In addition, the patient was informed of those risks and complications associated to the procedure, such as failure to decrease pain; infection; bleeding; organ or nerve damage with subsequent damage to sensory, motor, and/or autonomic systems, resulting in permanent pain, numbness, and/or weakness of one or several areas of the body; allergic reactions; (i.e.: anaphylactic reaction); and/or death. Furthermore, the patient was informed of those risks and complications associated with the medications. These include, but are not limited to: allergic reactions (i.e.: anaphylactic or anaphylactoid reaction(s)); adrenal axis suppression; blood sugar elevation that in diabetics may result in ketoacidosis or comma; water retention that in patients with history of congestive heart failure may result in shortness of breath, pulmonary edema, and decompensation with resultant heart failure; weight gain; swelling or edema; medication-induced neural toxicity; particulate matter embolism and blood vessel occlusion with resultant organ, and/or nervous system infarction; and/or aseptic necrosis of one or more joints. Finally, the patient was informed that Medicine is not an  exact science; therefore, there is also the possibility of unforeseen or unpredictable risks and/or possible complications that may result in a catastrophic outcome. The patient indicated having understood very clearly. We have given the patient no guarantees and we have made no promises. Enough time was given to the patient to ask questions, all of which were answered to the patient's satisfaction. Mr. Weekly has indicated that he wanted to continue with the procedure. Attestation: I, the ordering provider, attest that I have discussed with the patient the benefits, risks, side-effects, alternatives, likelihood of achieving goals, and potential problems during recovery for the procedure that I have provided informed consent. Date  Time: 04/15/2024 11:12 AM  Pre-Procedure Preparation:  Monitoring: As per clinic protocol. Respiration, ETCO2, SpO2, BP, heart rate and rhythm monitor placed and checked for adequate function Safety Precautions: Patient was assessed for positional comfort and pressure points before starting the procedure. Time-out: I initiated and conducted the Time-out before starting the procedure, as per protocol. The patient was asked to participate by confirming the accuracy of the Time Out information. Verification of the correct person, site, and procedure were performed and confirmed by me, the nursing staff, and the patient. Time-out conducted as per Joint Commission's Universal Protocol (UP.01.01.01). Time: 1150  Description/Narrative of Procedure:          Region: Distal lower extremity Target Area: Sensory peripheral nerves affected by diabetic peripheral neuropathy Site: Feet Approach: Percutaneous  No./Series: Not applicable  Type: Percutaneous  Purpose: Therapeutic  Region: Distal lower extremities  Description of the Procedure: Protocol guidelines were followed. The patient was assisted into a comfortable position.  Informed consent was obtained in the patient  monitored in the usual manner.  All questions were answered prior to the procedure.  They Qutenza  patches were applied to the affected area and then covered with the wrap.  The Patient was kept under observation until the treatment was completed.  The patches were removed and the treated area was inspected.  Vitals:   04/15/24 1125  BP: 114/68  Pulse: 66  Resp: 16  Temp: 97.7 F (36.5 C)  TempSrc: Temporal  SpO2: 95%  Weight: 215 lb (97.5 kg)  Height: 6' (1.829 m)     Start Time: 1150 hrs. End Time: 1230 hrs.   Post-operative Assessment:  Post-procedure Vital Signs:  Pulse/HCG Rate: 66  Temp: 97.7 F (36.5  C) Resp: 16 BP: 114/68 SpO2: 95 %  EBL: None  Complications: No immediate post-treatment complications observed by team, or reported by patient.  Note: The patient tolerated the entire procedure well. A repeat set of vitals were taken after the procedure and the patient was kept under observation following institutional policy, for this type of procedure. Post-procedural neurological assessment was performed, showing return to baseline, prior to discharge. The patient was provided with post-procedure discharge instructions, including a section on how to identify potential problems. Should any problems arise concerning this procedure, the patient was given instructions to immediately contact us , at any time, without hesitation. In any case, we plan to contact the patient by telephone for a follow-up status report regarding this interventional procedure.  Comments:  No additional relevant information.  Plan of Care (POC)  Orders:  Orders Placed This Encounter  Procedures   NEUROLYSIS    Please order Qutenza  patches    Standing Status:   Future    Expiration Date:   07/14/2024    Where will this procedure be performed?:   [facility]   Medications ordered for procedure: Meds ordered this encounter  Medications   capsaicin  topical system 8 % patch 4 patch    Medications administered: We administered capsaicin  topical system.  See the medical record for exact dosing, route, and time of administration.  Follow-up plan:   Return in about 3 months (around 07/14/2024) for Qutenza  #4.      Recent Visits Date Type Provider Dept  01/22/24 Office Visit Patel, Seema K, NP [facility]  Showing recent visits within past 90 days and meeting all other requirements Today's Visits Date Type Provider Dept  04/15/24 Office Visit Marcelino Nurse, MD [facility]  Showing today's visits and meeting all other requirements Future Appointments Date Type Provider Dept  07/14/24 Appointment Marcelino Nurse, MD [facility]  Showing future appointments within next 90 days and meeting all other requirements  Disposition: Discharge home  Discharge (Date  Time): 04/15/2024; 1235 hrs.   Primary Care Physician: Glover Lenis, MD Location: [facility] Note by: Nurse Marcelino, MD Date: 04/15/2024; Time: 1:09 PM  Disclaimer:  Medicine is not an exact science. The only guarantee in medicine is that nothing is guaranteed. It is important to note that the decision to proceed with this intervention was based on the information collected from the patient. The Data and conclusions were drawn from the patient's questionnaire, the interview, and the physical examination. Because the information was provided in large part by the patient, it cannot be guaranteed that it has not been purposely or unconsciously manipulated. Every effort has been made to obtain as much relevant data as possible for this evaluation. It is important to note that the conclusions that lead to this procedure are derived in large part from the available data. Always take into account that the treatment will also be dependent on availability of resources and existing treatment guidelines, considered by other Pain Management Practitioners as being common  knowledge and practice, at the time of the intervention. For Medico-Legal purposes, it is also important to point out that variation in procedural techniques and pharmacological choices are the acceptable norm. The indications, contraindications, technique, and results of the above procedure should only be interpreted and judged by a Board-Certified Interventional Pain Specialist with extensive familiarity and expertise in the same exact procedure and technique.

## 2024-07-14 ENCOUNTER — Ambulatory Visit: Admitting: Student in an Organized Health Care Education/Training Program
